# Patient Record
Sex: Female | Born: 1975 | Race: White | Hispanic: No | Marital: Married | Smoking: Never smoker
Health system: Southern US, Community
[De-identification: ages and names within clinical notes are randomized; demographics above are authoritative.]

## PROBLEM LIST (undated history)

## (undated) ENCOUNTER — Inpatient Hospital Stay (HOSPITAL_COMMUNITY): Payer: Self-pay

## (undated) DIAGNOSIS — Z8249 Family history of ischemic heart disease and other diseases of the circulatory system: Secondary | ICD-10-CM

## (undated) DIAGNOSIS — F411 Generalized anxiety disorder: Secondary | ICD-10-CM

## (undated) DIAGNOSIS — Z86010 Personal history of colonic polyps: Secondary | ICD-10-CM

## (undated) DIAGNOSIS — M1990 Unspecified osteoarthritis, unspecified site: Secondary | ICD-10-CM

## (undated) DIAGNOSIS — Z973 Presence of spectacles and contact lenses: Secondary | ICD-10-CM

## (undated) DIAGNOSIS — K635 Polyp of colon: Secondary | ICD-10-CM

## (undated) DIAGNOSIS — E559 Vitamin D deficiency, unspecified: Secondary | ICD-10-CM

## (undated) DIAGNOSIS — G40909 Epilepsy, unspecified, not intractable, without status epilepticus: Secondary | ICD-10-CM

## (undated) DIAGNOSIS — Z8719 Personal history of other diseases of the digestive system: Secondary | ICD-10-CM

## (undated) DIAGNOSIS — K589 Irritable bowel syndrome without diarrhea: Secondary | ICD-10-CM

## (undated) DIAGNOSIS — E282 Polycystic ovarian syndrome: Secondary | ICD-10-CM

## (undated) DIAGNOSIS — I2699 Other pulmonary embolism without acute cor pulmonale: Secondary | ICD-10-CM

## (undated) DIAGNOSIS — K5900 Constipation, unspecified: Secondary | ICD-10-CM

## (undated) DIAGNOSIS — Z8742 Personal history of other diseases of the female genital tract: Secondary | ICD-10-CM

## (undated) DIAGNOSIS — K829 Disease of gallbladder, unspecified: Secondary | ICD-10-CM

## (undated) DIAGNOSIS — Z87898 Personal history of other specified conditions: Secondary | ICD-10-CM

## (undated) DIAGNOSIS — Z860101 Personal history of adenomatous and serrated colon polyps: Secondary | ICD-10-CM

## (undated) DIAGNOSIS — Z9889 Other specified postprocedural states: Secondary | ICD-10-CM

## (undated) DIAGNOSIS — R112 Nausea with vomiting, unspecified: Secondary | ICD-10-CM

## (undated) DIAGNOSIS — K219 Gastro-esophageal reflux disease without esophagitis: Secondary | ICD-10-CM

## (undated) DIAGNOSIS — M51379 Other intervertebral disc degeneration, lumbosacral region without mention of lumbar back pain or lower extremity pain: Secondary | ICD-10-CM

## (undated) DIAGNOSIS — F909 Attention-deficit hyperactivity disorder, unspecified type: Secondary | ICD-10-CM

## (undated) DIAGNOSIS — M549 Dorsalgia, unspecified: Secondary | ICD-10-CM

## (undated) DIAGNOSIS — Z7901 Long term (current) use of anticoagulants: Secondary | ICD-10-CM

## (undated) DIAGNOSIS — R7303 Prediabetes: Secondary | ICD-10-CM

## (undated) DIAGNOSIS — N979 Female infertility, unspecified: Secondary | ICD-10-CM

## (undated) DIAGNOSIS — N920 Excessive and frequent menstruation with regular cycle: Secondary | ICD-10-CM

## (undated) DIAGNOSIS — IMO0002 Reserved for concepts with insufficient information to code with codable children: Secondary | ICD-10-CM

## (undated) DIAGNOSIS — Z91018 Allergy to other foods: Secondary | ICD-10-CM

## (undated) DIAGNOSIS — M2550 Pain in unspecified joint: Secondary | ICD-10-CM

## (undated) DIAGNOSIS — E669 Obesity, unspecified: Secondary | ICD-10-CM

## (undated) DIAGNOSIS — M5137 Other intervertebral disc degeneration, lumbosacral region: Secondary | ICD-10-CM

## (undated) DIAGNOSIS — F419 Anxiety disorder, unspecified: Secondary | ICD-10-CM

## (undated) DIAGNOSIS — J189 Pneumonia, unspecified organism: Secondary | ICD-10-CM

## (undated) HISTORY — DX: Prediabetes: R73.03

## (undated) HISTORY — DX: Constipation, unspecified: K59.00

## (undated) HISTORY — DX: Pain in unspecified joint: M25.50

## (undated) HISTORY — DX: Vitamin D deficiency, unspecified: E55.9

## (undated) HISTORY — PX: CHOLECYSTECTOMY: SHX55

## (undated) HISTORY — DX: Obesity, unspecified: E66.9

## (undated) HISTORY — PX: KNEE SURGERY: SHX244

## (undated) HISTORY — PX: COLPOSCOPY: SHX161

## (undated) HISTORY — DX: Female infertility, unspecified: N97.9

## (undated) HISTORY — DX: Other pulmonary embolism without acute cor pulmonale: I26.99

## (undated) HISTORY — DX: Dorsalgia, unspecified: M54.9

## (undated) HISTORY — DX: Allergy to other foods: Z91.018

## (undated) HISTORY — DX: Disease of gallbladder, unspecified: K82.9

## (undated) HISTORY — PX: KNEE ARTHROSCOPY: SUR90

## (undated) HISTORY — DX: Polyp of colon: K63.5

## (undated) HISTORY — PX: COLONOSCOPY: SHX174

## (undated) HISTORY — PX: UPPER GASTROINTESTINAL ENDOSCOPY: SHX188

## (undated) HISTORY — DX: Unspecified osteoarthritis, unspecified site: M19.90

## (undated) HISTORY — DX: Anxiety disorder, unspecified: F41.9

## (undated) HISTORY — PX: DILATION AND CURETTAGE OF UTERUS: SHX78

## (undated) HISTORY — DX: Epilepsy, unspecified, not intractable, without status epilepticus: G40.909

## (undated) HISTORY — PX: GYNECOLOGIC CRYOSURGERY: SHX857

---

## 1998-09-08 ENCOUNTER — Other Ambulatory Visit: Admission: RE | Admit: 1998-09-08 | Discharge: 1998-09-08 | Payer: Self-pay | Admitting: *Deleted

## 1999-01-09 ENCOUNTER — Other Ambulatory Visit: Admission: RE | Admit: 1999-01-09 | Discharge: 1999-01-09 | Payer: Self-pay | Admitting: Obstetrics and Gynecology

## 1999-01-20 ENCOUNTER — Other Ambulatory Visit: Admission: RE | Admit: 1999-01-20 | Discharge: 1999-01-20 | Payer: Self-pay | Admitting: Obstetrics and Gynecology

## 1999-05-09 ENCOUNTER — Other Ambulatory Visit: Admission: RE | Admit: 1999-05-09 | Discharge: 1999-05-09 | Payer: Self-pay | Admitting: Obstetrics and Gynecology

## 1999-08-22 ENCOUNTER — Other Ambulatory Visit: Admission: RE | Admit: 1999-08-22 | Discharge: 1999-08-22 | Payer: Self-pay | Admitting: *Deleted

## 2000-01-23 ENCOUNTER — Other Ambulatory Visit: Admission: RE | Admit: 2000-01-23 | Discharge: 2000-01-23 | Payer: Self-pay | Admitting: *Deleted

## 2000-02-23 ENCOUNTER — Encounter: Admission: RE | Admit: 2000-02-23 | Discharge: 2000-02-23 | Payer: Self-pay | Admitting: Internal Medicine

## 2000-02-23 ENCOUNTER — Encounter: Payer: Self-pay | Admitting: Internal Medicine

## 2000-12-09 ENCOUNTER — Encounter: Payer: Self-pay | Admitting: Internal Medicine

## 2000-12-09 ENCOUNTER — Encounter: Admission: RE | Admit: 2000-12-09 | Discharge: 2000-12-09 | Payer: Self-pay | Admitting: Internal Medicine

## 2001-09-02 ENCOUNTER — Encounter: Admission: RE | Admit: 2001-09-02 | Discharge: 2001-09-02 | Payer: Self-pay | Admitting: Internal Medicine

## 2001-09-02 ENCOUNTER — Encounter: Payer: Self-pay | Admitting: Internal Medicine

## 2001-10-08 HISTORY — PX: CHOLECYSTECTOMY, LAPAROSCOPIC: SHX56

## 2001-10-13 ENCOUNTER — Other Ambulatory Visit: Admission: RE | Admit: 2001-10-13 | Discharge: 2001-10-13 | Payer: Self-pay | Admitting: Obstetrics and Gynecology

## 2002-12-08 ENCOUNTER — Other Ambulatory Visit: Admission: RE | Admit: 2002-12-08 | Discharge: 2002-12-08 | Payer: Self-pay | Admitting: Obstetrics and Gynecology

## 2005-05-04 ENCOUNTER — Other Ambulatory Visit: Admission: RE | Admit: 2005-05-04 | Discharge: 2005-05-04 | Payer: Self-pay | Admitting: Obstetrics and Gynecology

## 2006-03-14 ENCOUNTER — Emergency Department (HOSPITAL_COMMUNITY): Admission: EM | Admit: 2006-03-14 | Discharge: 2006-03-14 | Payer: Self-pay | Admitting: Emergency Medicine

## 2008-04-27 ENCOUNTER — Encounter: Admission: RE | Admit: 2008-04-27 | Discharge: 2008-04-27 | Payer: Self-pay | Admitting: Family Medicine

## 2008-04-27 ENCOUNTER — Encounter: Admission: RE | Admit: 2008-04-27 | Discharge: 2008-06-29 | Payer: Self-pay | Admitting: Family Medicine

## 2009-10-08 HISTORY — PX: DIAGNOSTIC LAPAROSCOPY: SUR761

## 2010-05-17 LAB — LIPID PANEL
HDL: 48 mg/dL (ref 35–70)
Triglycerides: 100 mg/dL (ref 40–160)

## 2010-10-23 LAB — BASIC METABOLIC PANEL: Creatinine: 0.7 mg/dL (ref <=1.1)

## 2011-02-03 ENCOUNTER — Inpatient Hospital Stay (INDEPENDENT_AMBULATORY_CARE_PROVIDER_SITE_OTHER)
Admission: RE | Admit: 2011-02-03 | Discharge: 2011-02-03 | Disposition: A | Discharge status: Home / Self Care | Payer: Self-pay | Source: Ambulatory Visit | Attending: Family Medicine | Admitting: Family Medicine

## 2011-02-03 ENCOUNTER — Encounter: Payer: Self-pay | Admitting: Family Medicine

## 2011-02-03 DIAGNOSIS — J069 Acute upper respiratory infection, unspecified: Secondary | ICD-10-CM

## 2011-02-03 DIAGNOSIS — J029 Acute pharyngitis, unspecified: Secondary | ICD-10-CM

## 2011-02-03 LAB — CONVERTED CEMR LAB: Rapid Strep: NEGATIVE

## 2011-03-12 ENCOUNTER — Other Ambulatory Visit: Payer: Self-pay | Admitting: Obstetrics and Gynecology

## 2011-03-12 ENCOUNTER — Ambulatory Visit (HOSPITAL_COMMUNITY): Payer: BC Managed Care – PPO

## 2011-03-12 ENCOUNTER — Ambulatory Visit (HOSPITAL_COMMUNITY)
Admission: RE | Admit: 2011-03-12 | Discharge: 2011-03-12 | Disposition: A | Discharge status: Home / Self Care | Payer: BC Managed Care – PPO | Source: Ambulatory Visit | Attending: Obstetrics and Gynecology | Admitting: Obstetrics and Gynecology

## 2011-03-12 DIAGNOSIS — O021 Missed abortion: Secondary | ICD-10-CM | POA: Insufficient documentation

## 2011-03-12 HISTORY — PX: DILATION AND EVACUATION: SHX1459

## 2011-03-12 LAB — ABO/RH: ABO/RH(D): A POS

## 2011-03-12 LAB — CBC
MCH: 29.6 pg (ref 26.0–34.0)
Platelets: 311 10*3/uL (ref 150–400)
RBC: 4.5 MIL/uL (ref 3.87–5.11)
RDW: 13.6 % (ref 11.5–15.5)

## 2011-03-16 NOTE — Op Note (Signed)
  [name], [name]            ACCOUNT NO.:  [id number]  MEDICAL RECORD NO.:  [id number]  LOCATION:  [facility]                          FACILITY:  [facility]  PHYSICIAN:  Michaelangelo Mittelman L. Reis Pienta, M.D.DATE OF BIRTH:  [birth year]  DATE OF PROCEDURE:  03/12/2011 DATE OF DISCHARGE:                              OPERATIVE REPORT   PREOPERATIVE DIAGNOSIS:  Missed abortion.  POSTOPERATIVE DIAGNOSIS:  Missed abortion.  PROCEDURE:  D and E with chromosomes with ultrasound guidance.  SURGEON:  Jasmen Emrich L. Vincente Poli, MD  ANESTHESIA:  MAC with paracervical block.  FINDINGS:  Products of conception.  SPECIMENS:  Products of conception sent to Pathology.  ESTIMATED BLOOD LOSS:  Minimal.  COMPLICATIONS:  None.  PROCEDURE:  The patient was taken to the operating room . After informed consent was obtained, she was then prepped and draped in the usual sterile fashion.  In-and-out catheter was used to empty the bladder. Paracervical block was performed in standard fashion.  Cervical internal os was gently dilated using Pratt dilators.  Using ultrasound guidance, the #7 suction cannula was inserted.  Suction curettage was performed x2 with retrieval contents grossly consistent with products of conception. A sharp curette was inserted and the uterus was thoroughly curetted of all tissue until the uterine cavity appeared clean.  A final suction curettage was then performed.  All instruments were removed from the vagina.  All sponge, lap, and instrument counts were correct x2.  A portion of the tissue was sent for karyotype and the remainder will be sent for pathology routine.  ESTIMATED BLOOD LOSS:  Minimal.  COMPLICATIONS:  None.     Taylyn Brame L. Vincente Poli, M.D.     Florestine Avers  D:  03/12/2011  T:  03/13/2011  Job:  [id number]  Electronically Signed by Marcelle Overlie M.D. on 03/16/2011 07:12:42 AM

## 2011-07-02 ENCOUNTER — Inpatient Hospital Stay (INDEPENDENT_AMBULATORY_CARE_PROVIDER_SITE_OTHER)
Admission: RE | Admit: 2011-07-02 | Discharge: 2011-07-02 | Disposition: A | Discharge status: Home / Self Care | Payer: BC Managed Care – PPO | Source: Ambulatory Visit | Attending: Family Medicine | Admitting: Family Medicine

## 2011-07-02 ENCOUNTER — Encounter: Payer: Self-pay | Admitting: Family Medicine

## 2011-07-02 DIAGNOSIS — S8990XA Unspecified injury of unspecified lower leg, initial encounter: Secondary | ICD-10-CM

## 2011-07-08 ENCOUNTER — Encounter (HOSPITAL_COMMUNITY): Payer: Self-pay | Admitting: *Deleted

## 2011-07-08 ENCOUNTER — Inpatient Hospital Stay (HOSPITAL_COMMUNITY): Payer: BC Managed Care – PPO

## 2011-07-08 ENCOUNTER — Inpatient Hospital Stay (HOSPITAL_COMMUNITY)
Admission: AD | Admit: 2011-07-08 | Discharge: 2011-07-09 | Disposition: A | Discharge status: Home Health | Payer: BC Managed Care – PPO | Source: Ambulatory Visit | Attending: Obstetrics and Gynecology | Admitting: Obstetrics and Gynecology

## 2011-07-08 DIAGNOSIS — O209 Hemorrhage in early pregnancy, unspecified: Secondary | ICD-10-CM | POA: Insufficient documentation

## 2011-07-08 HISTORY — DX: Polycystic ovarian syndrome: E28.2

## 2011-07-08 HISTORY — DX: Irritable bowel syndrome without diarrhea: K58.9

## 2011-07-08 HISTORY — DX: Reserved for concepts with insufficient information to code with codable children: IMO0002

## 2011-07-08 LAB — URINALYSIS, ROUTINE W REFLEX MICROSCOPIC
Bilirubin Urine: NEGATIVE
Ketones, ur: NEGATIVE mg/dL
Nitrite: NEGATIVE
Protein, ur: NEGATIVE mg/dL
Specific Gravity, Urine: <1.005 — ABNORMAL LOW (ref 1.005–1.030)
Urobilinogen, UA: 0.2 mg/dL (ref 0.0–1.0)

## 2011-07-08 NOTE — ED Provider Notes (Signed)
History     Chief Complaint  Patient presents with  . Vaginal Bleeding   HPI  Pt is [redacted]w[redacted]d pregnant and states that she has had some pink mucous spotting this evening.  She has a history of miscarriage at 8 weeks and is also IUI.  She has had 2 ultrasounds with this pregnancy with confirmation of viability.  She denies cramping, UTI symptoms, chills, fever or constipation.    Past Medical History  Diagnosis Date  . PCOS (polycystic ovarian syndrome)   . IBS (irritable bowel syndrome)   . Abnormal Pap smear and cervical HPV (human papillomavirus)     Past Surgical History  Procedure Date  . Colposcopy   . Gynecologic cryosurgery   . Cholecystectomy     No family history on file.  History  Substance Use Topics  . Smoking status: Former Games developer  . Smokeless tobacco: Never Used  . Alcohol Use: No    Allergies:  Allergies  Allergen Reactions  . Codeine Itching  . Morphine And Related Other (See Comments)    Big mood changes  . Robitussin A-C (Guiatuss Ac) Itching  . Shrimp (Shellfish Allergy) Hives and Swelling    Throat closes    Prescriptions prior to admission  Medication Sig Dispense Refill  . cetirizine (ZYRTEC) 10 MG tablet Take 10 mg by mouth daily.        Marland Kitchen escitalopram (LEXAPRO) 10 MG tablet Take 10 mg by mouth daily.        . metFORMIN (GLUCOPHAGE) 500 MG tablet Take 500 mg by mouth daily with breakfast.        . Prenat w/o A-FeCbn-DSS-FA-DHA (CITRANATAL 90 DHA PO) Take 1 tablet by mouth daily.        . progesterone 200 MG SUPP Place 200 mg vaginally at bedtime.          ROS Physical Exam   Blood pressure 143/99, pulse 77, temperature 98.9 F (37.2 C), temperature source Oral, resp. rate 20, height 5' 5.5" (1.664 m), weight 277 lb (125.646 kg).  Physical Exam Pt  Is well developed well nourished white obese married female alert and oriented in no acute distress Pelvic speculum exam showed small amount of pink tinged mucousy discharge in vault; cervix  clean and closed- bimanual deffered MAU Course  Procedures speculum exam Ultrasound showed viable IUP Discussed with Dr. Arelia Sneddon Pt and husband reassured- will follow up with next OB appointment Continue progesterone supp    Assessment and Plan  Bleeding in early pregnancy Viable IUP  LINEBERRY,SUSAN 07/08/2011, 10:53 PM

## 2011-07-08 NOTE — Progress Notes (Signed)
Pt saw small bleeding on the toilet paper after she wiped-no active bleeding

## 2011-07-08 NOTE — Progress Notes (Signed)
Pt had SAB 03/09/11.  Is on progesterone vag supp during this preg.  Most recent u/s in office 3 days ago with viable fetus noted.  Had bleeding in toilet with small clot, then when wiping.

## 2011-07-10 LAB — OB RESULTS CONSOLE HIV ANTIBODY (ROUTINE TESTING): HIV: NONREACTIVE

## 2011-07-27 ENCOUNTER — Encounter: Payer: Self-pay | Admitting: Emergency Medicine

## 2011-07-27 ENCOUNTER — Inpatient Hospital Stay (INDEPENDENT_AMBULATORY_CARE_PROVIDER_SITE_OTHER)
Admission: RE | Admit: 2011-07-27 | Discharge: 2011-07-27 | Disposition: A | Discharge status: Home / Self Care | Payer: BC Managed Care – PPO | Source: Ambulatory Visit | Attending: Emergency Medicine | Admitting: Emergency Medicine

## 2011-07-27 DIAGNOSIS — J0100 Acute maxillary sinusitis, unspecified: Secondary | ICD-10-CM

## 2011-07-27 LAB — CONVERTED CEMR LAB: Rapid Strep: NEGATIVE

## 2011-09-10 NOTE — Progress Notes (Signed)
Summary: TOE INJ...WSE Room 4   Vital Signs:  Patient Profile:   35 Years Old Female CC:      Stumped rt great toe Height:     65 inches Weight:      275 pounds O2 Sat:      100 % O2 treatment:    Room Air Temp:     98.7 degrees F oral Pulse rate:   73 / minute Pulse rhythm:   regular Resp:     20 per minute BP sitting:   120 / 66  (left arm) Cuff size:   large  Vitals Entered By: Emilio Math (July 02, 2011 8:11 PM)                  Current Allergies (reviewed today): ! CODEINE ! * ENVIRONMENTALHistory of Present Illness Chief Complaint: Stumped rt great toe History of Present Illness:  Subjective:  Patient injured her right great toe when she bumped it.  Current Meds ZYRTEC HIVES RELIEF 10 MG TABS (CETIRIZINE HCL)  LEXAPRO 10 MG TABS (ESCITALOPRAM OXALATE)  METFORMIN HCL 500 MG TABS (METFORMIN HCL)  CITRANATAL 90 DHA 90-1 & 300 MG MISC (PRENAT W/O A-FECBGL-DSS-FA-DHA)   REVIEW OF SYSTEMS Constitutional Symptoms      Denies fever, chills, night sweats, weight loss, weight gain, and fatigue.  Eyes       Denies change in vision, eye pain, eye discharge, glasses, contact lenses, and eye surgery. Ear/Nose/Throat/Mouth       Denies hearing loss/aids, change in hearing, ear pain, ear discharge, dizziness, frequent runny nose, frequent nose bleeds, sinus problems, sore throat, hoarseness, and tooth pain or bleeding.  Respiratory       Denies dry cough, productive cough, wheezing, shortness of breath, asthma, bronchitis, and emphysema/COPD.  Cardiovascular       Denies murmurs, chest pain, and tires easily with exhertion.    Gastrointestinal       Denies stomach pain, nausea/vomiting, diarrhea, constipation, blood in bowel movements, and indigestion. Genitourniary       Denies painful urination, kidney stones, and loss of urinary control. Neurological       Denies paralysis, seizures, and fainting/blackouts. Musculoskeletal       Denies muscle pain, joint  pain, joint stiffness, decreased range of motion, redness, swelling, muscle weakness, and gout.  Skin       Complains of hair/skni or nail changes.      Denies bruising and unusual mles/lumps or sores.  Psych       Denies mood changes, temper/anger issues, anxiety/stress, speech problems, depression, and sleep problems.  Past History:  Past Medical History: Reviewed history from 02/03/2011 and no changes required. PCOS  Past Surgical History: LT knee surgery 2000 Cholecystectomy 2004 exploratory laproscopic abd surgery 2011 Denies surgical history  Family History: Reviewed history from 02/03/2011 and no changes required. 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  Full range of motion all joints.  Distal 5mm of left half of nail is partly avulsed.  No evidence laceration beneath nail. Assessment New Problems: TOE INJURY (ICD-959.7)  MINIMAL TOENAIL AVULSION  Plan New Orders: Est. Patient Level III [78295] Planning Comments:   Reassurance. Bandage applied.  Recommend applying daily bandage until nail grows out.  Cut nail short.   The patient and/or caregiver has been counseled thoroughly with regard to medications prescribed including dosage, schedule, interactions, rationale for use, and possible side  effects and they verbalize understanding.  Diagnoses and expected course of recovery discussed and will return if not improved as expected or if the condition worsens. Patient and/or caregiver verbalized understanding.   Orders Added: 1)  Est. Patient Level III [95621]

## 2011-09-10 NOTE — Progress Notes (Signed)
Summary: ?Strep/TM rm 5   Vital Signs:  Patient Profile:   35 Years Old Female CC:      sore throat, HA, fever x 4-5 days Height:     65 inches Weight:      261.50 pounds O2 Sat:      98 % O2 treatment:    Room Air Temp:     98.6 degrees F oral Pulse rate:   93 / minute Resp:     16 per minute BP sitting:   125 / 91  (left arm) Cuff size:   large  Vitals Entered By: Clemens Catholic LPN (February 03, 2011 10:13 AM)                  Updated Prior Medication List: ZYRTEC HIVES RELIEF 10 MG TABS (CETIRIZINE HCL)  LEXAPRO 10 MG TABS (ESCITALOPRAM OXALATE)  METFORMIN HCL 500 MG TABS (METFORMIN HCL)  CITRANATAL 90 DHA 90-1 & 300 MG MISC (PRENAT W/O A-FECBGL-DSS-FA-DHA)   Current Allergies: ! CODEINE ! * ENVIRONMENTALHistory of Present Illness Chief Complaint: sore throat, HA, fever x 4-5 days History of Present Illness:  Subjective: Patient complains of URI symptoms for about 5 days with initial frontal headache and pressure in her ears and sinuses. + sore throat + cough for 3 days No pleuritic pain No wheezing + nasal congestion + post nasal drainage No itchy/red eyes ? earache No hemoptysis No SOB No fever, + chills and sweats No nausea No vomiting No abdominal pain No diarrhea No skin rashes + fatigue No myalgias + headache Used OTC meds without relief   REVIEW OF SYSTEMS Constitutional Symptoms       Complains of fever, chills, and night sweats.     Denies weight loss, weight gain, and fatigue.  Eyes       Complains of eye drainage and contact lenses.      Denies change in vision, eye pain, glasses, and eye surgery.      Comments: watery eyes Ear/Nose/Throat/Mouth       Complains of ear pain, ear discharge, frequent runny nose, sinus problems, and sore throat.      Denies hearing loss/aids, change in hearing, dizziness, frequent nose bleeds, hoarseness, and tooth pain or bleeding.  Respiratory       Denies dry cough, productive cough, wheezing, shortness  of breath, asthma, bronchitis, and emphysema/COPD.  Cardiovascular       Denies murmurs, chest pain, and tires easily with exhertion.    Gastrointestinal       Denies stomach pain, nausea/vomiting, diarrhea, constipation, blood in bowel movements, and indigestion. Genitourniary       Denies painful urination, kidney stones, and loss of urinary control. Neurological       Complains of headaches.      Denies paralysis, seizures, and fainting/blackouts. Musculoskeletal       Denies muscle pain, joint pain, joint stiffness, decreased range of motion, redness, swelling, muscle weakness, and gout.  Skin       Denies bruising, unusual mles/lumps or sores, and hair/skin or nail changes.  Psych       Denies mood changes, temper/anger issues, anxiety/stress, speech problems, depression, and sleep problems. Other Comments: pt c/o sore throat, bilateral ear ache, HA x 4-5 days, she developed a fever last night. she is trying to get pregnant so she has not taken any OTC meds.    Past History:  Past Medical History: PCOS  Past Surgical History: LT knee surgery 2000 Cholecystectomy 2004 exploratory laproscopic  abd surgery 2011  Family History: none  Social History: Never Smoked Alcohol use-no Drug use-no Smoking Status:  never Drug Use:  no   Objective:  Appearance:  Patient appears healthy, stated age, and in no acute distress  Eyes:  Pupils are equal, round, and reactive to light and accomdation.  Extraocular movement is intact.  Conjunctivae are not inflamed.  Ears:  Canals normal.  Tympanic membranes normal.   Nose:  Mildly congested turbinates.  No sinus tenderness  Pharynx:  Normal Neck:  Supple.  Slightly tender shotty posterior nodes are palpated bilaterally.  Lungs:  Clear to auscultation.  Breath sounds are equal.  Heart:  Regular rate and rhythm without murmurs, rubs, or gallops.  Abdomen:  Nontender without masses or hepatosplenomegaly.  Bowel sounds are present.  No CVA  or flank tenderness.  Extremities:  No edema. Skin:  No rash  Rapid strep test negative  Tympanogram:  Normal both ears. Assessment New Problems: UPPER RESPIRATORY INFECTION, ACUTE (ICD-465.9) ACUTE PHARYNGITIS (ICD-462)  NO EVIDENCE BACTERIAL INFECTION TODAY  Plan New Medications/Changes: AMOXICILLIN 875 MG TABS (AMOXICILLIN) One by mouth two times a day. (Rx void after 02/11/11)  #14 x 0, 02/03/2011, Donna Christen MD  New Orders: Services provided After hours-Weekends-Holidays [99051] Pulse Oximetry (single measurment) [94760] Tympanometry [92567] Rapid Strep [69629] New Patient Level IV [52841] Planning Comments:   Treat symptomatically for now:  Increase fluid intake, begin expectorant/decongestant, topical decongestant,  cough suppressant at bedtime.  If fever/chills/sweats persist, or if not improving 5 to 7 days begin amoxicillin (given Rx to hold).  Followup with PCP if not improving 7 to 10 days.   The patient and/or caregiver has been counseled thoroughly with regard to medications prescribed including dosage, schedule, interactions, rationale for use, and possible side effects and they verbalize understanding.  Diagnoses and expected course of recovery discussed and will return if not improved as expected or if the condition worsens. Patient and/or caregiver verbalized understanding.  Prescriptions: AMOXICILLIN 875 MG TABS (AMOXICILLIN) One by mouth two times a day. (Rx void after 02/11/11)  #14 x 0   Entered and Authorized by:   Donna Christen MD   Signed by:   Donna Christen MD on 02/03/2011   Method used:   Print then Give to Patient   RxID:   [id number]   Patient Instructions: 1)  Stop Zyrtec for now  2)  Increase fluid intake, rest. 3)   Also recommend using saline nasal spray several times daily and/or saline nasal irrigation. 4)  Begin amoxicillin if not improving about 5 to 7 days or if persistent fever develops. 5)  Followup with family doctor if not  improving 7 to 10 days.   Orders Added: 1)  Services provided After hours-Weekends-Holidays [99051] 2)  Pulse Oximetry (single measurment) [94760] 3)  Tympanometry [92567] 4)  Rapid Strep [03474] 5)  New Patient Level IV [99204]    Laboratory Results  Date/Time Received: February 03, 2011 10:18 AM  Date/Time Reported: February 03, 2011 10:18 AM   Other Tests  Rapid Strep: negative  Kit Test Internal QC: Negative   (Normal Range: Negative)

## 2011-09-10 NOTE — Progress Notes (Signed)
Summary: Possible Sinus Infection rm 5   Vital Signs:  Patient Profile:   35 Years Old Female CC:      Ha, cough, sore throat, bil ear pressure x 9-10 days Height:     65 inches Weight:      273.75 pounds O2 Sat:      99 % O2 treatment:    Room Air Temp:     98.8 degrees F oral Pulse rate:   81 / minute Resp:     18 per minute BP sitting:   100 / 66  (left arm) Cuff size:   large  Vitals Entered By: Clemens Catholic LPN (July 27, 2011 10:09 AM)                  Updated Prior Medication List: ZYRTEC HIVES RELIEF 10 MG TABS (CETIRIZINE HCL)  LEXAPRO 10 MG TABS (ESCITALOPRAM OXALATE)  METFORMIN HCL 500 MG TABS (METFORMIN HCL)  CITRANATAL 90 DHA 90-1 & 300 MG MISC (PRENAT W/O A-FECBGL-DSS-FA-DHA)  PROGESTERONE 50 MG/ML OIL (PROGESTERONE)   Current Allergies (reviewed today): ! CODEINE ! * ENVIRONMENTALHistory of Present Illness Chief Complaint: Ha, cough, sore throat, bil ear pressure x 9-10 days History of Present Illness: 35 Years Old Female complains of onset of cold symptoms for 5-7 days.  Mareta has been using 2 Tylenol last night which is helping a little bit.  She is [redacted] weeks pregnant and has PCOS so it took a long time to become pregnant.  She called her OB and is concerned. + sore throat No cough No pleuritic pain No wheezing +nasal congestion +post-nasal drainage +sinus pain/pressure No chest congestion No itchy/red eyes + earache No hemoptysis No SOB No chills/sweats ? fever No nausea No vomiting No abdominal pain No diarrhea No skin rashes No fatigue No myalgias No headache   REVIEW OF SYSTEMS Constitutional Symptoms       Complains of fever, chills, and night sweats.     Denies weight loss, weight gain, and fatigue.  Eyes       Complains of contact lenses.      Denies change in vision, eye pain, eye discharge, glasses, and eye surgery. Ear/Nose/Throat/Mouth       Complains of ear pain, frequent runny nose, sinus problems, and sore  throat.      Denies hearing loss/aids, change in hearing, ear discharge, dizziness, frequent nose bleeds, hoarseness, and tooth pain or bleeding.  Respiratory       Complains of dry cough.      Denies productive cough, wheezing, shortness of breath, asthma, bronchitis, and emphysema/COPD.  Cardiovascular       Denies murmurs, chest pain, and tires easily with exhertion.    Gastrointestinal       Denies stomach pain, nausea/vomiting, diarrhea, constipation, blood in bowel movements, and indigestion. Genitourniary       Denies painful urination, kidney stones, and loss of urinary control. Neurological       Complains of headaches.      Denies paralysis, seizures, and fainting/blackouts. Musculoskeletal       Denies muscle pain, joint pain, joint stiffness, decreased range of motion, redness, swelling, muscle weakness, and gout.  Skin       Denies bruising, unusual mles/lumps or sores, and hair/skin or nail changes.  Psych       Denies mood changes, temper/anger issues, anxiety/stress, speech problems, depression, and sleep problems. Other Comments: pt c/o Ha, cough, sore throat, bil ear pressure x 9-10 days. she has taken  tylenol.   Past History:  Past Medical History: PCOS IBS  Past Surgical History: Reviewed history from 07/02/2011 and no changes required. LT knee surgery 2000 Cholecystectomy 2004 exploratory laproscopic abd surgery 2011 Denies surgical history  Family History: mom- IBS  Social History: Reviewed history from 07/02/2011 and no changes required. 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 Patient Level II [04540] Rapid Strep [98119] Planning Comments:   No meds given today.  Education given instead.  Discussed class A, B, C, D, X with patient.  Since she is AMA with PCOS, she really needs to weigh the pros and cons of taking any meds during pregnancy, especially during the first trimester.  Tylenol is likely ok, but I'd prefer nothing.  She may have L maxillary sinusitis, but I think it can be treated with nasal saline, warm compresses, rest, hydration.  She should call her OB to discuss on Monday no matter what.  If getting much worse with high fevers, may consider Amox at that time (Class B in pregnancy).  The patient understands and agrees.   The patient and/or caregiver has been counseled thoroughly with regard to medications prescribed including dosage, schedule, interactions, rationale for use, and possible side effects and they verbalize understanding.  Diagnoses and expected course of recovery discussed and will return if not improved as expected or if the condition worsens. Patient and/or caregiver verbalized understanding.   Orders Added: 1)  Est. Patient Level II [14782] 2)  Rapid Strep [95621]    Laboratory Results  Date/Time Received: July 27, 2011 10:11 AM  Date/Time Reported: July 27, 2011 10:11 AM   Other Tests  Rapid Strep: negative  Kit Test Internal QC: Negative   (Normal Range: Negative)

## 2011-10-09 NOTE — L&D Delivery Note (Signed)
Operative Delivery Note At 3:40 PM a viable female was delivered via Vaginal, Spontaneous Delivery.  Presentation: vertex; Position: Right,, Occiput,, Anterior; Station: +3.  Verbal consent: obtained from patient.  Risks and benefits discussed in detail.  Risks include, but are not limited to the risks of anesthesia, bleeding, infection, damage to maternal tissues, fetal cephalhematoma.  There is also the risk of inability to effect vaginal delivery of the head, or shoulder dystocia that cannot be resolved by established maneuvers, leading to the need for emergency cesarean section.  APGAR: 9 9   Placenta status: delivered spontaneously with 3 v cord  Cord:  with the following complications: short.   Anesthesia:epidural   Instruments: mushroom vacuum Episiotomy: none Lacerations: 2nd degree Suture Repair: 2.0 3.0 chromic vicryl Est. Blood Loss (mL): 400  Mom to postpartum.  Baby to nursery-stable.  Barbara Mcmahon L 02/11/2012, 4:03 PM

## 2011-12-04 ENCOUNTER — Inpatient Hospital Stay (HOSPITAL_COMMUNITY)
Admission: AD | Admit: 2011-12-04 | Discharge: 2011-12-04 | Disposition: A | Discharge status: Home / Self Care | Payer: BC Managed Care – PPO | Source: Ambulatory Visit | Attending: Obstetrics and Gynecology | Admitting: Obstetrics and Gynecology

## 2011-12-04 ENCOUNTER — Encounter (HOSPITAL_COMMUNITY): Payer: Self-pay | Admitting: *Deleted

## 2011-12-04 ENCOUNTER — Inpatient Hospital Stay (HOSPITAL_COMMUNITY): Payer: BC Managed Care – PPO

## 2011-12-04 DIAGNOSIS — IMO0002 Reserved for concepts with insufficient information to code with codable children: Secondary | ICD-10-CM | POA: Insufficient documentation

## 2011-12-04 DIAGNOSIS — O479 False labor, unspecified: Secondary | ICD-10-CM

## 2011-12-04 DIAGNOSIS — S3991XA Unspecified injury of abdomen, initial encounter: Secondary | ICD-10-CM

## 2011-12-04 DIAGNOSIS — R1084 Generalized abdominal pain: Secondary | ICD-10-CM

## 2011-12-04 DIAGNOSIS — O368190 Decreased fetal movements, unspecified trimester, not applicable or unspecified: Secondary | ICD-10-CM | POA: Insufficient documentation

## 2011-12-04 LAB — CBC
HCT: 33 % — ABNORMAL LOW (ref 36.0–46.0)
Hemoglobin: 11.1 g/dL — ABNORMAL LOW (ref 12.0–15.0)
RBC: 3.65 MIL/uL — ABNORMAL LOW (ref 3.87–5.11)
WBC: 12.5 10*3/uL — ABNORMAL HIGH (ref 4.0–10.5)
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
## 2011-12-25 ENCOUNTER — Encounter: Payer: Self-pay | Admitting: *Deleted

## 2011-12-25 ENCOUNTER — Emergency Department
Admission: EM | Admit: 2011-12-25 | Discharge: 2011-12-25 | Disposition: A | Discharge status: Home / Self Care | Payer: BC Managed Care – PPO | Source: Home / Self Care

## 2011-12-25 DIAGNOSIS — J029 Acute pharyngitis, unspecified: Secondary | ICD-10-CM

## 2011-12-25 DIAGNOSIS — J069 Acute upper respiratory infection, unspecified: Secondary | ICD-10-CM

## 2011-12-25 LAB — POCT RAPID STREP A (OFFICE): Rapid Strep A Screen: NEGATIVE

## 2011-12-25 NOTE — ED Provider Notes (Signed)
History     CSN: [id number]  Arrival date & time 12/25/11  0935   None     Chief Complaint  Patient presents with  . Sore Throat    Patient is a 36 y.o. female presenting with pharyngitis. The history is provided by the patient.  Sore Throat This is a new problem. The current episode started 2 days ago. The problem occurs constantly. The problem has not changed since onset.Associated symptoms include headaches. Associated symptoms comments: Right earache, intermittent cough, postnasal drip. The symptoms are aggravated by nothing. The symptoms are relieved by nothing. She has tried nothing for the symptoms.    Past Medical History  Diagnosis Date  . PCOS (polycystic ovarian syndrome)   . IBS (irritable bowel syndrome)   . Abnormal Pap smear and cervical HPV (human papillomavirus)   . Personality disorder     Past Surgical History  Procedure Date  . Colposcopy   . Gynecologic cryosurgery   . Cholecystectomy     Family History  Problem Relation Age of Onset  . Hypertension Father   . Diabetes Paternal Grandmother   . Hypertension Paternal Grandmother   . Stroke Paternal Grandmother   . Diabetes Paternal Grandfather   . Hypertension Paternal Grandfather   . Stroke Paternal Grandfather   . Hypertension Mother     History  Substance Use Topics  . Smoking status: Never Smoker   . Smokeless tobacco: Never Used  . Alcohol Use: No    OB History    Grav Para Term Preterm Abortions TAB SAB Ect Mult Living   2    1  1          Review of Systems  Constitutional: Positive for fever and chills.  HENT: Positive for ear pain, congestion and postnasal drip.   Respiratory: Positive for cough.   Neurological: Positive for headaches.  All other systems reviewed and are negative.    Allergies  Codeine; Morphine and related; Robitussin a-c; and Shrimp  Home Medications   Current Outpatient Rx  Name Route Sig Dispense Refill  . ACETAMINOPHEN 500 MG PO TABS Oral Take  1,000 mg by mouth every 6 (six) hours as needed. Pain, headache    . CETIRIZINE HCL 10 MG PO TABS Oral Take 10 mg by mouth daily.      Marland Kitchen ESCITALOPRAM OXALATE 10 MG PO TABS Oral Take 10 mg by mouth daily.      Marland Kitchen METFORMIN HCL 500 MG PO TABS Oral Take 500 mg by mouth daily with breakfast.      . PROTONIX PO Oral Take 1 tablet by mouth at bedtime.    Marland Kitchen CITRANATAL 90 DHA PO Oral Take 1 tablet by mouth daily.        BP 136/84  Pulse 80  Temp(Src) 98.4 F (36.9 C) (Oral)  Resp 18  Ht 5' 5.5" (1.664 m)  Wt 293 lb 8 oz (133.131 kg)  BMI 48.10 kg/m2  SpO2 98%  Physical Exam  Nursing note and vitals reviewed. Constitutional: She appears well-developed.  HENT:  Head: Normocephalic and atraumatic.  Right Ear: External ear normal.  Left Ear: External ear normal.  Nose: Nose normal.  Mouth/Throat: Oropharynx is clear and moist.  Eyes: Pupils are equal, round, and reactive to light.  Neck: Normal range of motion. Neck supple.  Cardiovascular: Normal rate, regular rhythm and normal heart sounds.   Pulmonary/Chest: Effort normal and breath sounds normal. No respiratory distress.  Lymphadenopathy:    She has no cervical adenopathy.  ED Course  Procedures none   Labs Reviewed  POCT RAPID STREP A (OFFICE)   No results found. Rapid strep test negative   1. Acute pharyngitis       MDM          Lannie Fields, NP 12/25/11 1323

## 2011-12-25 NOTE — Discharge Instructions (Signed)
Your strep test is negative.  Increase fluid intake.  Take tylenol and Zyrtec as needed for symptom management.  Practice good hand hygiene.Sore Throat Sore throats may be caused by bacteria and viruses. They may also be caused by:  Smoking.   Pollution.   Allergies.  If a sore throat is due to strep infection (a bacterial infection), you may need:  A throat swab.   A culture test to verify the strep infection.  You will need one of these:  An antibiotic shot.   Oral medicine for a full 10 days.  Strep infection is very contagious. A doctor should check any close contacts who have a sore throat or fever. A sore throat caused by a virus infection will usually last only 3-4 days. Antibiotics will not treat a viral sore throat.  Infectious mononucleosis (a viral disease), however, can cause a sore throat that lasts for up to 3 weeks. Mononucleosis can be diagnosed with blood tests. You must have been sick for at least 1 week in order for the test to give accurate results. HOME CARE INSTRUCTIONS   To treat a sore throat, take mild pain medicine.   Increase your fluids.   Eat a soft diet.   Do not smoke.   Gargling with warm water or salt water (1 tsp. salt in 8 oz. water) can be helpful.   Try throat sprays or lozenges or sucking on hard candy to ease the symptoms.  Call your doctor if your sore throat lasts longer than 1 week.  SEEK IMMEDIATE MEDICAL CARE IF:  You have difficulty breathing.   You have increased swelling in the throat.   You have pain so severe that you are unable to swallow fluids or your saliva.   You have a severe headache, a high fever, vomiting, or a red rash.  Document Released: 11/01/2004 Document Revised: 09/13/2011 Document Reviewed: 09/11/2007 [facility] Patient Information 2012 [place], [place].

## 2011-12-25 NOTE — ED Provider Notes (Signed)
History     CSN: [id number]  Arrival date & time 12/25/11  0935   None     Chief Complaint  Patient presents with  . Sore Throat    (Consider location/radiation/quality/duration/timing/severity/associated sxs/prior treatment) HPI  Past Medical History  Diagnosis Date  . PCOS (polycystic ovarian syndrome)   . IBS (irritable bowel syndrome)   . Abnormal Pap smear and cervical HPV (human papillomavirus)   . Personality disorder     Past Surgical History  Procedure Date  . Colposcopy   . Gynecologic cryosurgery   . Cholecystectomy     Family History  Problem Relation Age of Onset  . Hypertension Father   . Diabetes Paternal Grandmother   . Hypertension Paternal Grandmother   . Stroke Paternal Grandmother   . Diabetes Paternal Grandfather   . Hypertension Paternal Grandfather   . Stroke Paternal Grandfather   . Hypertension Mother     History  Substance Use Topics  . Smoking status: Never Smoker   . Smokeless tobacco: Never Used  . Alcohol Use: No    OB History    Grav Para Term Preterm Abortions TAB SAB Ect Mult Living   2    1  1          Review of Systems  Allergies  Codeine; Morphine and related; Robitussin a-c; and Shrimp  Home Medications   Current Outpatient Rx  Name Route Sig Dispense Refill  . ACETAMINOPHEN 500 MG PO TABS Oral Take 1,000 mg by mouth every 6 (six) hours as needed. Pain, headache    . CETIRIZINE HCL 10 MG PO TABS Oral Take 10 mg by mouth daily.      Marland Kitchen ESCITALOPRAM OXALATE 10 MG PO TABS Oral Take 10 mg by mouth daily.      Marland Kitchen METFORMIN HCL 500 MG PO TABS Oral Take 500 mg by mouth daily with breakfast.      . PROTONIX PO Oral Take 1 tablet by mouth at bedtime.    Marland Kitchen CITRANATAL 90 DHA PO Oral Take 1 tablet by mouth daily.        BP 136/84  Pulse 80  Temp(Src) 98.4 F (36.9 C) (Oral)  Resp 18  Ht 5' 5.5" (1.664 m)  Wt 293 lb 8 oz (133.131 kg)  BMI 48.10 kg/m2  SpO2 98%  Physical Exam  ED Course  Procedures (including  critical care time)   Labs Reviewed  POCT RAPID STREP A (OFFICE)   No results found.   1. Acute pharyngitis       MDM  Treat symptomatically        Lattie Haw, MD 12/25/11 Ernestina [place]

## 2011-12-25 NOTE — ED Notes (Signed)
Pt c/o sore throat, RT ear ache, and HA x 2 days. No OTC meds.

## 2012-01-12 ENCOUNTER — Encounter (HOSPITAL_COMMUNITY): Payer: Self-pay | Admitting: *Deleted

## 2012-01-12 ENCOUNTER — Inpatient Hospital Stay (HOSPITAL_COMMUNITY)
Admission: AD | Admit: 2012-01-12 | Discharge: 2012-01-12 | Disposition: A | Discharge status: Home / Self Care | Payer: BC Managed Care – PPO | Source: Ambulatory Visit | Attending: Obstetrics and Gynecology | Admitting: Obstetrics and Gynecology

## 2012-01-12 DIAGNOSIS — R112 Nausea with vomiting, unspecified: Secondary | ICD-10-CM

## 2012-01-12 DIAGNOSIS — R197 Diarrhea, unspecified: Secondary | ICD-10-CM | POA: Insufficient documentation

## 2012-01-12 DIAGNOSIS — O212 Late vomiting of pregnancy: Secondary | ICD-10-CM | POA: Insufficient documentation

## 2012-01-12 MED ORDER — PROMETHAZINE HCL 12.5 MG PO TABS: 12.5000 mg | ORAL_TABLET | Freq: Four times a day (QID) | ORAL | Status: DC | PRN

## 2012-01-12 MED ORDER — LACTATED RINGERS IV SOLN
25.0000 mg | Freq: Once | INTRAVENOUS | Status: AC
  Administered 2012-01-12: 25 mg via INTRAVENOUS
  Filled 2012-01-12: qty 1

## 2012-01-12 NOTE — MAU Provider Note (Signed)
History     CSN: [id number]  Arrival date and time: 01/12/12 1333   First Provider Initiated Contact with Patient 01/12/12 1414      Chief Complaint  Patient presents with  . Diarrhea   HPI Barbara Mcmahon is 36 y.o. G2P0010 [redacted]w[redacted]d weeks presenting with >20 episodes of diarrhea since 5am.  Has had nausea and just now vomited X 3.  Patient of Dr. Lynnell Dike.  States she ate a salad at a restaurant /baby shower yesterday.   Only one that got sick only one that had salad.   Last ate this am- a few pretzels.  Denies fever.  Having chills.      Past Medical History  Diagnosis Date  . PCOS (polycystic ovarian syndrome)   . IBS (irritable bowel syndrome)   . Abnormal Pap smear and cervical HPV (human papillomavirus)   . Personality disorder     Past Surgical History  Procedure Date  . Colposcopy   . Gynecologic cryosurgery   . Cholecystectomy     Family History  Problem Relation Age of Onset  . Hypertension Father   . Diabetes Paternal Grandmother   . Hypertension Paternal Grandmother   . Stroke Paternal Grandmother   . Diabetes Paternal Grandfather   . Hypertension Paternal Grandfather   . Stroke Paternal Grandfather   . Hypertension Mother     History  Substance Use Topics  . Smoking status: Never Smoker   . Smokeless tobacco: Never Used  . Alcohol Use: No    Allergies:  Allergies  Allergen Reactions  . Codeine Hives and Itching  . Morphine And Related Other (See Comments)    Big mood changes  . Robitussin A-C (Guiatuss Ac) Itching  . Shrimp (Shellfish Allergy) Hives and Swelling    Throat closes    Prescriptions prior to admission  Medication Sig Dispense Refill  . acetaminophen (TYLENOL) 500 MG tablet Take 1,000 mg by mouth every 6 (six) hours as needed. Pain, headache      . cetirizine (ZYRTEC) 10 MG tablet Take 10 mg by mouth daily.        Marland Kitchen escitalopram (LEXAPRO) 10 MG tablet Take 10 mg by mouth daily.        . metFORMIN (GLUCOPHAGE) 500 MG tablet Take  500 mg by mouth daily with breakfast.        . Pantoprazole Sodium (PROTONIX PO) Take 1 tablet by mouth at bedtime.      . Prenat w/o A-FeCbn-DSS-FA-DHA (CITRANATAL 90 DHA PO) Take 1 tablet by mouth daily.          Review of Systems  Constitutional: Positive for chills. Negative for fever.  Gastrointestinal: Positive for nausea, vomiting and diarrhea.   Physical Exam   Blood pressure 124/71, pulse 109, temperature 98.1 F (36.7 C), temperature source Oral, resp. rate 18, height 5' 5.5" (1.664 m), weight 133.358 kg (294 lb).  Physical Exam  Constitutional: She is oriented to person, place, and time. She appears well-developed and well-nourished. No distress.       Feels tired.    Neck: Normal range of motion.  Respiratory: Effort normal.  Neurological: She is alert and oriented to person, place, and time.  Skin: Skin is warm and dry.  Psychiatric: She has a normal mood and affect. Her behavior is normal.    MAU Course  Procedures  MDM 14:35  Reported patient's history and  MSE to Dr. Renaldo Fiddler.  Order given to bolus 1 liter LR with Phenergan 25mg .   May  follow with second liter if needed.  May give Immodium of patient desires.   Discussed option of Phenergan and Zofran.  Patient states she is very tired and would like to sleep.  Will give Phenergan.  Patient declined Immodium at this time.  Wants to wait to see if hydration helps.     16:45  Went in to check on patient.  1 liter of LR with Phenergan 25mg  has now infused and she states she is feeling better.  She does not want Immodium, has at home if she changes her mind.  She is ready for discharge.  Assessment and Plan  A:  Nausea, vomiting, and diarrhea           Differential DX:  Gastroenteritis                               Food poisoning                                Noro Virus  P:  Instructed to begin BRAT diet after vomiting stops     Important to stay well hydrated     Rx for Phenergan     Keep appointment with Dr.  Vincente Poli for continued prenatal care or earlier if sxs worsen  P:   Bianco Cange,EVE M 01/12/2012, 2:28 PM

## 2012-01-12 NOTE — MAU Note (Signed)
Pt reports having diarrhea off and on all morning along with abd cramping. Pt denies N/V. MD told her to come in .

## 2012-01-12 NOTE — Discharge Instructions (Signed)
B.R.A.T. Diet Your doctor has recommended the B.R.A.T. diet for you or your child until the condition improves. This is often used to help control diarrhea and vomiting symptoms. If you or your child can tolerate clear liquids, you may have:  Bananas.   Rice.   Applesauce.   Toast (and other simple starches such as crackers, potatoes, noodles).  Be sure to avoid dairy products, meats, and fatty foods until symptoms are better. Fruit juices such as apple, grape, and prune juice can make diarrhea worse. Avoid these. Continue this diet for 2 days or as instructed by your caregiver. Document Released: 09/24/2005 Document Revised: 09/13/2011 Document Reviewed: 03/13/2007 [facility] Patient Information 2012 [place], [place].Diarrhea Diarrhea is watery poop (stool). The most common cause of diarrhea is a germ. Other causes include:  Food poisoning.   A reaction to medicine.  HOME CARE   Drink clear fluids. This can stop you from losing too much body fluid (dehydration).   Drink enough fluids to keep your pee (urine) clear or pale yellow.   Avoid solid foods and dairy products until you start to feel better. Then start eating bland foods, such as:   Bananas.   Rice.   Crackers.   Applesauce.   Dry toast.   Avoid spicy foods, caffeine, and alcohol.   Your doctor may give medicine to help with cramps and watery poop. Take this as told. Avoid these medicines if you have a fever or blood in your poop.   Take your medicine as told. Finish them even if you start to feel better.  GET HELP RIGHT AWAY IF:   The watery poop lasts longer than 3 days.   You have a fever.   Your baby is older than 3 months with a rectal temperature of 100.5 F (38.1 C) or higher for more than 1 day.   There is blood in your poop.   You start to throw up (vomit).   You lose too much fluid.  MAKE SURE YOU:   Understand these instructions.   Will watch your condition.   Will get help right away if  you are not doing well or get worse.  Document Released: 03/12/2008 Document Revised: 09/13/2011 Document Reviewed: 03/12/2008 [facility] Patient Information 2012 [place], [place].Nausea and Vomiting Nausea is a sick feeling that often comes before throwing up (vomiting). Vomiting is a reflex where stomach contents come out of your mouth. Vomiting can cause severe loss of body fluids (dehydration). Children and elderly adults can become dehydrated quickly, especially if they also have diarrhea. Nausea and vomiting are symptoms of a condition or disease. It is important to find the cause of your symptoms. CAUSES   Direct irritation of the stomach lining. This irritation can result from increased acid production (gastroesophageal reflux disease), infection, food poisoning, taking certain medicines (such as nonsteroidal anti-inflammatory drugs), alcohol use, or tobacco use.   Signals from the brain.These signals could be caused by a headache, heat exposure, an inner ear disturbance, increased pressure in the brain from injury, infection, a tumor, or a concussion, pain, emotional stimulus, or metabolic problems.   An obstruction in the gastrointestinal tract (bowel obstruction).   Illnesses such as diabetes, hepatitis, gallbladder problems, appendicitis, kidney problems, cancer, sepsis, atypical symptoms of a heart attack, or eating disorders.   Medical treatments such as chemotherapy and radiation.   Receiving medicine that makes you sleep (general anesthetic) during surgery.  DIAGNOSIS Your caregiver may ask for tests to be done if the problems do not  improve after a few days. Tests may also be done if symptoms are severe or if the reason for the nausea and vomiting is not clear. Tests may include:  Urine tests.   Blood tests.   Stool tests.   Cultures (to look for evidence of infection).   X-rays or other imaging studies.  Test results can help your caregiver make decisions about treatment  or the need for additional tests. TREATMENT You need to stay well hydrated. Drink frequently but in small amounts.You may wish to drink water, sports drinks, clear broth, or eat frozen ice pops or gelatin dessert to help stay hydrated.When you eat, eating slowly may help prevent nausea.There are also some antinausea medicines that may help prevent nausea. HOME CARE INSTRUCTIONS   Take all medicine as directed by your caregiver.   If you do not have an appetite, do not force yourself to eat. However, you must continue to drink fluids.   If you have an appetite, eat a normal diet unless your caregiver tells you differently.   Eat a variety of complex carbohydrates (rice, wheat, potatoes, bread), lean meats, yogurt, fruits, and vegetables.   Avoid high-fat foods because they are more difficult to digest.   Drink enough water and fluids to keep your urine clear or pale yellow.   If you are dehydrated, ask your caregiver for specific rehydration instructions. Signs of dehydration may include:   Severe thirst.   Dry lips and mouth.   Dizziness.   Dark urine.   Decreasing urine frequency and amount.   Confusion.   Rapid breathing or pulse.  SEEK IMMEDIATE MEDICAL CARE IF:   You have blood or brown flecks (like coffee grounds) in your vomit.   You have black or bloody stools.   You have a severe headache or stiff neck.   You are confused.   You have severe abdominal pain.   You have chest pain or trouble breathing.   You do not urinate at least once every 8 hours.   You develop cold or clammy skin.   You continue to vomit for longer than 24 to 48 hours.   You have a fever.  MAKE SURE YOU:   Understand these instructions.   Will watch your condition.   Will get help right away if you are not doing well or get worse.  Document Released: 09/24/2005 Document Revised: 09/13/2011 Document Reviewed: 02/21/2011 [facility] Patient Information 2012 [place], [place].

## 2012-01-17 LAB — OB RESULTS CONSOLE GBS: GBS: NEGATIVE

## 2012-02-11 ENCOUNTER — Inpatient Hospital Stay (HOSPITAL_COMMUNITY): Payer: BC Managed Care – PPO | Admitting: Anesthesiology

## 2012-02-11 ENCOUNTER — Encounter (HOSPITAL_COMMUNITY): Payer: Self-pay | Admitting: *Deleted

## 2012-02-11 ENCOUNTER — Encounter (HOSPITAL_COMMUNITY): Payer: Self-pay | Admitting: Anesthesiology

## 2012-02-11 ENCOUNTER — Inpatient Hospital Stay (HOSPITAL_COMMUNITY)
Admission: AD | Admit: 2012-02-11 | Discharge: 2012-02-13 | DRG: 373 | Disposition: A | Discharge status: Home / Self Care | Payer: BC Managed Care – PPO | Source: Ambulatory Visit | Attending: Obstetrics and Gynecology | Admitting: Obstetrics and Gynecology

## 2012-02-11 DIAGNOSIS — J0100 Acute maxillary sinusitis, unspecified: Secondary | ICD-10-CM

## 2012-02-11 DIAGNOSIS — O09529 Supervision of elderly multigravida, unspecified trimester: Secondary | ICD-10-CM | POA: Diagnosis present

## 2012-02-11 HISTORY — DX: Generalized anxiety disorder: F41.1

## 2012-02-11 LAB — CBC
Hemoglobin: 12.4 g/dL (ref 12.0–15.0)
MCH: 29 pg (ref 26.0–34.0)
MCHC: 32.9 g/dL (ref 30.0–36.0)
MCV: 88.3 fL (ref 78.0–100.0)
Platelets: 284 10*3/uL (ref 150–400)
RBC: 4.27 MIL/uL (ref 3.87–5.11)

## 2012-02-11 MED ORDER — LACTATED RINGERS IV SOLN
INTRAVENOUS | Status: DC
  Administered 2012-02-11 (×2): via INTRAVENOUS

## 2012-02-11 MED ORDER — LANOLIN HYDROUS EX OINT: TOPICAL_OINTMENT | CUTANEOUS | Status: DC | PRN

## 2012-02-11 MED ORDER — OXYCODONE-ACETAMINOPHEN 5-325 MG PO TABS
1.0000 | ORAL_TABLET | ORAL | Status: DC | PRN
  Administered 2012-02-12: 2 via ORAL
  Administered 2012-02-12: 1 via ORAL
  Filled 2012-02-11: qty 2
  Filled 2012-02-11: qty 1

## 2012-02-11 MED ORDER — FENTANYL 2.5 MCG/ML BUPIVACAINE 1/10 % EPIDURAL INFUSION (WH - ANES)
14.0000 mL/h | INTRAMUSCULAR | Status: DC
  Filled 2012-02-11: qty 60

## 2012-02-11 MED ORDER — SODIUM BICARBONATE 8.4 % IV SOLN
INTRAVENOUS | Status: DC | PRN
  Administered 2012-02-11: 4 mL via EPIDURAL

## 2012-02-11 MED ORDER — PANTOPRAZOLE SODIUM 20 MG PO TBEC
20.0000 mg | DELAYED_RELEASE_TABLET | Freq: Every day | ORAL | Status: DC
  Administered 2012-02-11 – 2012-02-12 (×2): 20 mg via ORAL
  Filled 2012-02-11 (×3): qty 1

## 2012-02-11 MED ORDER — CITRIC ACID-SODIUM CITRATE 334-500 MG/5ML PO SOLN: 30.0000 mL | ORAL | Status: DC | PRN

## 2012-02-11 MED ORDER — ONDANSETRON HCL 4 MG/2ML IJ SOLN: 4.0000 mg | INTRAMUSCULAR | Status: DC | PRN

## 2012-02-11 MED ORDER — FLEET ENEMA 7-19 GM/118ML RE ENEM: 1.0000 | ENEMA | Freq: Every day | RECTAL | Status: DC | PRN

## 2012-02-11 MED ORDER — DIPHENHYDRAMINE HCL 50 MG/ML IJ SOLN: 12.5000 mg | INTRAMUSCULAR | Status: DC | PRN

## 2012-02-11 MED ORDER — IBUPROFEN 600 MG PO TABS: 600.0000 mg | ORAL_TABLET | Freq: Four times a day (QID) | ORAL | Status: DC | PRN

## 2012-02-11 MED ORDER — PHENYLEPHRINE 40 MCG/ML (10ML) SYRINGE FOR IV PUSH (FOR BLOOD PRESSURE SUPPORT): 80.0000 ug | PREFILLED_SYRINGE | INTRAVENOUS | Status: DC | PRN

## 2012-02-11 MED ORDER — LIDOCAINE HCL (PF) 1 % IJ SOLN: 30.0000 mL | INTRAMUSCULAR | Status: DC | PRN

## 2012-02-11 MED ORDER — TERBUTALINE SULFATE 1 MG/ML IJ SOLN: 0.2500 mg | Freq: Once | INTRAMUSCULAR | Status: DC | PRN

## 2012-02-11 MED ORDER — PHENYLEPHRINE 40 MCG/ML (10ML) SYRINGE FOR IV PUSH (FOR BLOOD PRESSURE SUPPORT)
80.0000 ug | PREFILLED_SYRINGE | INTRAVENOUS | Status: DC | PRN
  Administered 2012-02-11: 80 ug via INTRAVENOUS
  Filled 2012-02-11: qty 5

## 2012-02-11 MED ORDER — DIPHENHYDRAMINE HCL 25 MG PO CAPS: 25.0000 mg | ORAL_CAPSULE | Freq: Four times a day (QID) | ORAL | Status: DC | PRN

## 2012-02-11 MED ORDER — TETANUS-DIPHTH-ACELL PERTUSSIS 5-2.5-18.5 LF-MCG/0.5 IM SUSP: 0.5000 mL | Freq: Once | INTRAMUSCULAR | Status: DC

## 2012-02-11 MED ORDER — EPHEDRINE 5 MG/ML INJ
10.0000 mg | INTRAVENOUS | Status: AC | PRN
  Administered 2012-02-11 (×2): 10 mg via INTRAVENOUS
  Filled 2012-02-11: qty 4

## 2012-02-11 MED ORDER — BENZOCAINE-MENTHOL 20-0.5 % EX AERO
1.0000 "application " | INHALATION_SPRAY | CUTANEOUS | Status: DC | PRN
  Administered 2012-02-11 – 2012-02-13 (×2): 1 via TOPICAL
  Filled 2012-02-11 (×2): qty 56

## 2012-02-11 MED ORDER — SIMETHICONE 80 MG PO CHEW: 80.0000 mg | CHEWABLE_TABLET | ORAL | Status: DC | PRN

## 2012-02-11 MED ORDER — OXYTOCIN 20 UNITS IN LACTATED RINGERS INFUSION - SIMPLE: 125.0000 mL/h | Freq: Once | INTRAVENOUS | Status: DC

## 2012-02-11 MED ORDER — IBUPROFEN 600 MG PO TABS
600.0000 mg | ORAL_TABLET | Freq: Four times a day (QID) | ORAL | Status: DC
  Administered 2012-02-11 – 2012-02-13 (×8): 600 mg via ORAL
  Filled 2012-02-11 (×8): qty 1

## 2012-02-11 MED ORDER — EPHEDRINE 5 MG/ML INJ: 10.0000 mg | INTRAVENOUS | Status: DC | PRN

## 2012-02-11 MED ORDER — LACTATED RINGERS IV SOLN
500.0000 mL | INTRAVENOUS | Status: DC | PRN
  Administered 2012-02-11: 500 mL via INTRAVENOUS

## 2012-02-11 MED ORDER — LACTATED RINGERS IV SOLN
500.0000 mL | Freq: Once | INTRAVENOUS | Status: AC
  Administered 2012-02-11: 500 mL via INTRAVENOUS

## 2012-02-11 MED ORDER — ONDANSETRON HCL 4 MG/2ML IJ SOLN: 4.0000 mg | Freq: Four times a day (QID) | INTRAMUSCULAR | Status: DC | PRN

## 2012-02-11 MED ORDER — LORATADINE 10 MG PO TABS
10.0000 mg | ORAL_TABLET | Freq: Every day | ORAL | Status: DC
  Administered 2012-02-11 – 2012-02-13 (×3): 10 mg via ORAL
  Filled 2012-02-11 (×4): qty 1

## 2012-02-11 MED ORDER — MEASLES, MUMPS & RUBELLA VAC ~~LOC~~ INJ: 0.5000 mL | INJECTION | Freq: Once | SUBCUTANEOUS | Status: DC

## 2012-02-11 MED ORDER — OXYCODONE-ACETAMINOPHEN 5-325 MG PO TABS: 1.0000 | ORAL_TABLET | ORAL | Status: DC | PRN

## 2012-02-11 MED ORDER — WITCH HAZEL-GLYCERIN EX PADS: 1.0000 "application " | MEDICATED_PAD | CUTANEOUS | Status: DC | PRN

## 2012-02-11 MED ORDER — ACETAMINOPHEN 325 MG PO TABS: 650.0000 mg | ORAL_TABLET | ORAL | Status: DC | PRN

## 2012-02-11 MED ORDER — MEDROXYPROGESTERONE ACETATE 150 MG/ML IM SUSP: 150.0000 mg | INTRAMUSCULAR | Status: DC | PRN

## 2012-02-11 MED ORDER — FENTANYL 2.5 MCG/ML BUPIVACAINE 1/10 % EPIDURAL INFUSION (WH - ANES)
INTRAMUSCULAR | Status: DC | PRN
  Administered 2012-02-11: 14 mL/h via EPIDURAL

## 2012-02-11 MED ORDER — FLEET ENEMA 7-19 GM/118ML RE ENEM: 1.0000 | ENEMA | RECTAL | Status: DC | PRN

## 2012-02-11 MED ORDER — PRENATAL MULTIVITAMIN CH
1.0000 | ORAL_TABLET | Freq: Every day | ORAL | Status: DC
  Administered 2012-02-11 – 2012-02-13 (×3): 1 via ORAL
  Filled 2012-02-11 (×3): qty 1

## 2012-02-11 MED ORDER — DIBUCAINE 1 % RE OINT: 1.0000 "application " | TOPICAL_OINTMENT | RECTAL | Status: DC | PRN

## 2012-02-11 MED ORDER — ZOLPIDEM TARTRATE 5 MG PO TABS: 5.0000 mg | ORAL_TABLET | Freq: Every evening | ORAL | Status: DC | PRN

## 2012-02-11 MED ORDER — METFORMIN HCL 500 MG PO TABS
500.0000 mg | ORAL_TABLET | Freq: Every day | ORAL | Status: DC
  Administered 2012-02-12: 500 mg via ORAL
  Filled 2012-02-11 (×3): qty 1

## 2012-02-11 MED ORDER — BISACODYL 10 MG RE SUPP: 10.0000 mg | Freq: Every day | RECTAL | Status: DC | PRN

## 2012-02-11 MED ORDER — OXYTOCIN BOLUS FROM INFUSION
500.0000 mL | Freq: Once | INTRAVENOUS | Status: DC
  Filled 2012-02-11: qty 500

## 2012-02-11 MED ORDER — SENNOSIDES-DOCUSATE SODIUM 8.6-50 MG PO TABS
2.0000 | ORAL_TABLET | Freq: Every day | ORAL | Status: DC
  Administered 2012-02-11 – 2012-02-12 (×2): 2 via ORAL

## 2012-02-11 MED ORDER — ESCITALOPRAM OXALATE 10 MG PO TABS
10.0000 mg | ORAL_TABLET | Freq: Every day | ORAL | Status: DC
  Administered 2012-02-11 – 2012-02-13 (×3): 10 mg via ORAL
  Filled 2012-02-11 (×4): qty 1

## 2012-02-11 MED ORDER — ONDANSETRON HCL 4 MG PO TABS: 4.0000 mg | ORAL_TABLET | ORAL | Status: DC | PRN

## 2012-02-11 MED ORDER — OXYTOCIN 20 UNITS IN LACTATED RINGERS INFUSION - SIMPLE
1.0000 m[IU]/min | INTRAVENOUS | Status: DC
  Administered 2012-02-11: 2 m[IU]/min via INTRAVENOUS
  Filled 2012-02-11: qty 1000

## 2012-02-11 NOTE — H&P (Signed)
36 year old G 2 P 0010 at 39 weeks presents in active labor. PNC see [facility] GBBS is negative  Afebrile  Vital signs stable Fetal heart rate is reactive Toco irregular UCs Lung CTA Car RRR Abd is soft and non tender Cervix in office 90%/4 - 5 / -2 Vertex  IMPRESSION: IUP at 39 weeks Labor  Plan: Epidual anticipate NSVD

## 2012-02-11 NOTE — Anesthesia Preprocedure Evaluation (Signed)

## 2012-02-11 NOTE — Anesthesia Procedure Notes (Signed)

## 2012-02-12 LAB — CBC
MCV: 89.1 fL (ref 78.0–100.0)
Platelets: 240 10*3/uL (ref 150–400)
RBC: 3.4 MIL/uL — ABNORMAL LOW (ref 3.87–5.11)
WBC: 18.4 10*3/uL — ABNORMAL HIGH (ref 4.0–10.5)

## 2012-02-12 NOTE — Anesthesia Postprocedure Evaluation (Signed)
  Anesthesia Post-op Note  Patient: Barbara Mcmahon  Procedure(s) Performed: * No surgery found *  Patient Location: Mother/Baby  Anesthesia Type: Epidural  Level of Consciousness: awake  Airway and Oxygen Therapy: Patient Spontanous Breathing  Post-op Pain: none  Post-op Assessment: Patient's Cardiovascular Status Stable, Respiratory Function Stable, Patent Airway, No signs of Nausea or vomiting, Adequate PO intake, Pain level controlled, No headache, No backache, No residual numbness and No residual motor weakness  Post-op Vital Signs: Reviewed and stable  Complications: No apparent anesthesia complications

## 2012-02-12 NOTE — Progress Notes (Signed)
Post Partum Day 1 Subjective: no complaints, up ad lib, voiding, tolerating PO and + flatus  Objective: Blood pressure 121/79, pulse 85, temperature 97.9 F (36.6 C), temperature source Oral, resp. rate 18, height 5' 4.5" (1.638 m), weight 131.997 kg (291 lb), SpO2 98.00%, unknown if currently breastfeeding.  Physical Exam:  General: alert and cooperative Lochia: appropriate Uterine Fundus: firm Incision: perineum intact DVT Evaluation: No evidence of DVT seen on physical exam.   Basename 02/12/12 0505 02/11/12 1120  HGB 10.0* 12.4  HCT 30.3* 37.7    Assessment/Plan: Plan for discharge tomorrow   LOS: 1 day   CURTIS,CAROL G 02/12/2012, 7:49 AM

## 2012-02-12 NOTE — Progress Notes (Signed)

## 2012-02-13 MED ORDER — IBUPROFEN 600 MG PO TABS: 600.0000 mg | ORAL_TABLET | Freq: Four times a day (QID) | ORAL | Status: AC

## 2012-02-13 MED ORDER — OXYCODONE-ACETAMINOPHEN 5-325 MG PO TABS: 1.0000 | ORAL_TABLET | ORAL | Status: AC | PRN

## 2012-02-13 NOTE — Progress Notes (Signed)
Post Partum Day 2 Subjective: no complaints, up ad lib, voiding, tolerating PO and + flatus  Objective: Blood pressure 97/62, pulse 89, temperature 98.3 F (36.8 C), temperature source Oral, resp. rate 20, height 5' 4.5" (1.638 m), weight 131.997 kg (291 lb), SpO2 97.00%, unknown if currently breastfeeding.  Physical Exam:  General: alert and cooperative Lochia: appropriate Uterine Fundus: firm Incision: perineum intact DVT Evaluation: No evidence of DVT seen on physical exam.   Basename 02/12/12 0505 02/11/12 1120  HGB 10.0* 12.4  HCT 30.3* 37.7    Assessment/Plan: Discharge home   LOS: 2 days   Ras Kollman G 02/13/2012, 7:59 AM

## 2012-02-13 NOTE — Discharge Summary (Signed)
Obstetric Discharge Summary Reason for Admission: onset of labor Prenatal Procedures: none and ultrasound Intrapartum Procedures: vacuum Postpartum Procedures: none Complications-Operative and Postpartum: 2 degree perineal laceration Hemoglobin  Date Value Range Status  02/12/2012 10.0* 12.0-15.0 (Mcmahon/dL) Final     DELTA CHECK NOTED     REPEATED TO VERIFY     HCT  Date Value Range Status  02/12/2012 30.3* 36.0-46.0 (%) Final    Physical Exam:  General: alert and cooperative Lochia: appropriate Uterine Fundus: firm Incision: perineum intact DVT Evaluation: No evidence of DVT seen on physical exam.  Discharge Diagnoses: Term Pregnancy-delivered  Discharge Information: Date: 02/13/2012 Activity: pelvic rest Diet: routine Medications: PNV, Ibuprofen, Percocet and lexapro Condition: stable Instructions: refer to practice specific booklet Discharge to: home   Newborn Data: Live born female  Birth Weight: 6 lb 9.8 oz (2999 Mcmahon) APGAR: 8, 9  Home with mother.  Barbara Mcmahon 02/13/2012, 8:15 AM

## 2012-02-27 ENCOUNTER — Ambulatory Visit (HOSPITAL_COMMUNITY)
Admission: RE | Admit: 2012-02-27 | Discharge: 2012-02-27 | Disposition: A | Discharge status: Home / Self Care | Payer: BC Managed Care – PPO | Source: Ambulatory Visit | Attending: Obstetrics and Gynecology | Admitting: Obstetrics and Gynecology

## 2012-02-27 NOTE — Progress Notes (Signed)
Infant Lactation Consultation Outpatient Visit Note  Patient Name: Barbara Mcmahon Date of Birth: 1976/08/03 Birth Weight:   Gestational Age at Delivery: Gestational Age: <None> Type of Delivery:   Breastfeeding History Frequency of Breastfeeding: 10 times a day Length of Feeding:  Voids: 6+ Stools: 3-4  Supplementing / Method: Pumping:  Type of Pump:PIS   Frequency:NA  Volume:    Comments:  Concerned pumping might be painful.  Here today because baby was not back to birthweight at 72 days old.  Pediatrician recommended supplementing after feeding.  Has been taking approximately 30 ml by bottle.  Barbara Mcmahon's weight is greater than her birth weight today.  Consultation Evaluation:  Initial Feeding Assessment: Pre-feed [id number]:0454 Post-feed Weight:3030 Amount Transferred:36 ml Comments:  Chewing at the breast.  Dimpling noted.  Cleft in tip of tongue.  :  Additional Feeding Assessment: :  Total Breast milk Transferred this Visit: 56 (20 via bottle) Total Supplement Given: 20 ml breast milk and 20 ml formula  Additional Interventions:   Attempted  Finger feeding, SNS and Nipple sheild not successful.  Mother was able to pump 20 ml after breast feeding.  Paced bottle feeding taught.  Barbara Mcmahon did have a deeper suck with this method. Plan is to have mother pump and continue supplementing.     Follow-Up Friday at ped.  Tuesday in lactation.      Barbara Mcmahon 02/27/2012, 10:37 AM

## 2012-02-28 ENCOUNTER — Encounter (HOSPITAL_COMMUNITY): Payer: BC Managed Care – PPO

## 2012-03-04 ENCOUNTER — Ambulatory Visit (HOSPITAL_COMMUNITY)
Admission: RE | Admit: 2012-03-04 | Discharge: 2012-03-04 | Disposition: A | Discharge status: Home / Self Care | Payer: BC Managed Care – PPO | Source: Ambulatory Visit | Attending: Obstetrics and Gynecology | Admitting: Obstetrics and Gynecology

## 2012-03-04 NOTE — Progress Notes (Signed)
Adult Lactation Consultation Outpatient Visit Note  Patient Name: Barbara Mcmahon)    BABY: [name] Date of Birth: 1976/09/21                         DOB: [birth year] Gestational Age at Delivery: TERM        BIRTH WEIGHT: 6-9.8 Type of Delivery: NVD                             WEIGHT TODAY: 7-6.8  Breastfeeding History: Frequency of Breastfeeding: EVERY 2-3 HOURS Length of Feeding: 20+ Voids: 10 Stools: 2-3 YELLOW OR BROWN  Supplementing / Method:FORMULA/BOTTLE 2-4 OZ PC EBM/FORMULA Pumping:  Type of Pump:PIS   Frequency:3 TIMES/24 HOURS  Volume: 30 MLS   Comments:    Consultation Evaluation:Mom and 60 week old baby here for follow up appointment from 02/27/12.  Baby has history of greater than 10 % weight loss at 12 days when mom started formula supplementation.  Baby has gained 15 oz/ 6 days.  Mom has history of PCOS and anovulatory cycles.  She understands this may be a link with low milk supply.  Mom started pumping some 1 week ago but usually only getting 3 pumpings in per day.  Observed baby latch and nurse on both breasts.  Latch shallow initially and mom shown techniques to obtain deeper latch.  Baby alternates between nutritive, non nutritive sucking and some chewing.  Swallows heard and mom shown how good breast massage/compression increases baby's nutritive sucking.  Mom instructed to continue feeding at least every 3 hours and importance of increasing frequency of post pumping to increase milk supply.  Information given on fenugreek and moringa.  Mom will continue to supplement with EBM/FORMULA after breastfeeding.  Initial Feeding Assessment:LEFT BREAST 15 MINUTES Pre-feed Weight:3370 Post-feed Weight:3390 Amount Transferred:20 MLS Comments:  Additional Feeding Assessment:RIGHT BREAST X 15 MINUTES Pre-feed Weight:3390 Post-feed Weight:3410 Amount Transferred:20 MLS Comments:  Additional Feeding Assessment: Pre-feed Weight: Post-feed Weight: Amount  Transferred: Comments:  Total Breast milk Transferred this Visit: 40 MLS Total Supplement Given: NONE  BABY RECEIVED 60 MLS PRIOR TO APPOINTMENT  Additional Interventions:   Follow-UpOUTPATIENT APPOINTMENT June 4 2:30 PM      Hansel Feinstein 03/04/2012, 2:27 PM

## 2012-03-05 ENCOUNTER — Ambulatory Visit (HOSPITAL_COMMUNITY): Payer: BC Managed Care – PPO

## 2012-03-11 ENCOUNTER — Ambulatory Visit (HOSPITAL_COMMUNITY)
Admission: RE | Admit: 2012-03-11 | Discharge: 2012-03-11 | Disposition: A | Discharge status: Home / Self Care | Payer: BC Managed Care – PPO | Source: Ambulatory Visit | Attending: Obstetrics and Gynecology | Admitting: Obstetrics and Gynecology

## 2012-03-13 NOTE — Progress Notes (Signed)
Adult Lactation Consultation Outpatient Visit Note  Patient Name: Barbara Mcmahon (mom)    BABY: Barbara Mcmahon Date of Birth: May 03, 1976                            DOB: [birth year] Gestational Age at Delivery: 2 WEEKS  BIRTH WEIGHT: 6-9.8 Type of Delivery: NVD                               WEIGHT 03/04/12  7-6.8                                                                    WEIGHT TODAY  8-4.1 Breastfeeding History: Frequency of Breastfeeding: 2-2.5 HOURS Length of Feeding: 15 MINUTES EACH BREASTVoids: qs  Stools: qs  Supplementing / Method:EBM/FORMULA 60 MLS PC Pumping:  Type of Pump:PUMP IN STYLE   Frequency:4-5 TIMES PER DAY AFTER FEEDINGS  Volume:  1.5 OZ-2 OZ TOTAL  Comments:    Consultation Evaluation: Mom and baby here for follow up from 1 week ago.  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  Mom states she has relaxed more and OK with the possibility of needing to continue some formula supplementation.  Mom started fenugreek and reports breasts feeling fuller. Mom latching baby easily and well.  Baby nurses actively with audible swallows.  Milk transfer 76 mls after 30 minute feeding.  Mom pleased.  Demonstrated SNS to use when mom desires for supplementation and baby did well and mom comfortable with this.  Mom will continue plan and call prn for follow up.  Initial Feeding Assessment: LEFT BREAST 20 MINUTES Pre-feed ZOXWRU:0454 Post-feed UJWJXB:1478 Amount Transferred:62 MLS Comments:  Additional Feeding Assessment:RIGHT BREAST 10 MINUTES Pre-feed Weight:3806 Post-feed Weight:3820 Amount Transferred:14 MLS Comments:  Additional Feeding Assessment: Pre-feed Weight: Post-feed Weight: Amount Transferred: Comments:  Total Breast milk Transferred this Visit: 76 MLS Total Supplement Given: NONE  Additional Interventions:   Follow-Up WILL CALL LC OFFICE PRN      Hansel Feinstein 03/13/2012, 7:50 AM

## 2012-03-19 ENCOUNTER — Other Ambulatory Visit: Payer: Self-pay | Admitting: Obstetrics and Gynecology

## 2012-04-28 ENCOUNTER — Ambulatory Visit (HOSPITAL_COMMUNITY)
Admission: RE | Admit: 2012-04-28 | Discharge: 2012-04-28 | Disposition: A | Discharge status: Home / Self Care | Payer: BC Managed Care – PPO | Source: Ambulatory Visit | Attending: Obstetrics and Gynecology | Admitting: Obstetrics and Gynecology

## 2012-04-28 NOTE — Progress Notes (Signed)
Adult Lactation Consultation Outpatient Visit Note  Patient Name: Barbara Mcmahon (mother)     BABY: Barbara Mcmahon Pares Date of Birth: [birth year]                                DOB: [birth year] Gestational Age at Delivery: 39 weeks          BIRTH WEIGHT: 6-9.8 Type of Delivery:                                             WEIGHT TODAY: 12-6.2  Breastfeeding History: Frequency of Breastfeeding: EVERY 3 HOURS BOTH BREASTS Length of Feeding: 30-45 MINUTES Voids: QS Stools: QS  Supplementing / Method:FORMULA/BOTTLE 15 MLS-60 MLS PC EVERY 3 HOURS Pumping:  Type of Pump:PUMP IN STYLE   Frequency:ONCE IN AM, ONCE IN PM  Volume:  2-4 OZ  Mom has been freezing this milk for RTW  Comments:    Consultation Evaluation:Mom and 26 week old baby here today for feeding assessment.  Mom has history of delayed and possibly low milk supply.  Mom states baby started clamping down during breastfeeding 2 weeks ago and nipples are becoming sore.  She states the clamping occurs throughout the feeding on both breasts.  Mom denies any changes in bottles or pacifiers.  Baby shows no signs of gum swelling or oral thrush.  Observed baby at breast with gulping frequently and baby needing to pull back at times to take a break.  Mom reports that she noticed milk leaking more and spraying recently.  Explained to mom that due to increased milk supply her letdown is fast and at times forceful so baby clamps to control flow.  This was observed on both breasts.  Recommended mom try leaning back during feeding and elevating baby's head to handle flow.  Comfort gels given for pink, tender nipples.  Mom returns to work in one week and plans to pump while at work.  Baby transferred 102 mls in 15 minutes.  Recommended mom discontinue formula supplementation.  Initial Feeding Assessment: Pre-feed Weight: Post-feed Weight: Amount Transferred: Comments:  Additional Feeding Assessment: Pre-feed Weight: Post-feed Weight: Amount  Transferred: Comments:  Additional Feeding Assessment: Pre-feed Weight: Post-feed Weight: Amount Transferred: Comments:  Total Breast milk Transferred this Visit: 102 mls Total Supplement Given: none  Additional Interventions:   Follow-Up Will call prn      Hansel Feinstein 04/28/2012, 2:36 PM

## 2012-06-13 ENCOUNTER — Emergency Department
Admission: EM | Admit: 2012-06-13 | Discharge: 2012-06-13 | Disposition: A | Discharge status: Home / Self Care | Payer: BC Managed Care – PPO | Source: Home / Self Care

## 2012-06-13 ENCOUNTER — Encounter: Payer: Self-pay | Admitting: *Deleted

## 2012-06-13 DIAGNOSIS — H6690 Otitis media, unspecified, unspecified ear: Secondary | ICD-10-CM

## 2012-06-13 DIAGNOSIS — J069 Acute upper respiratory infection, unspecified: Secondary | ICD-10-CM

## 2012-06-13 MED ORDER — AMOXICILLIN 500 MG PO CAPS: 1000.0000 mg | ORAL_CAPSULE | Freq: Three times a day (TID) | ORAL | Status: AC

## 2012-06-13 NOTE — ED Notes (Signed)
Patient c/o congestion, sinus pain, HA, productive cough, right ear pain x 1 week. Taken sudafed OTC. Patient is breastfeeding.

## 2012-06-13 NOTE — ED Provider Notes (Signed)
History     CSN: [id number]  Arrival date & time 06/13/12  0818   First MD Initiated Contact with Patient 06/13/12 [phone]      Chief Complaint  Patient presents with  . Nasal Congestion  . Cough   HPI SINUSITIS Onset:  1 week Location: maxillary sinuses  Description:rhinorrhea, nasal congestion, sinus pressure, R ear pain   Modifying factors: multiple sick contacts at home  Symptoms Cough:  mild Discharge:  yes Fever: no Sinus Pressure:  no Ears Blocked:  No Dyspnea: no Teeth Ache:  no Frontal Headache:  mild Second Sickening:  No Ear Pain: R sided   Red Flags Change in mental state: no Change in vision: no    Past Medical History  Diagnosis Date  . PCOS (polycystic ovarian syndrome)   . IBS (irritable bowel syndrome)   . Abnormal Pap smear and cervical HPV (human papillomavirus)   . Generalized anxiety disorder     Past Surgical History  Procedure Date  . Colposcopy   . Gynecologic cryosurgery   . Cholecystectomy   . Dilation and curettage of uterus   . Knee surgery right    Family History  Problem Relation Age of Onset  . Hypertension Father   . Diabetes Paternal Grandmother   . Hypertension Paternal Grandmother   . Stroke Paternal Grandmother   . Diabetes Paternal Grandfather   . Hypertension Paternal Grandfather   . Stroke Paternal Grandfather   . Hypertension Mother     History  Substance Use Topics  . Smoking status: Never Smoker   . Smokeless tobacco: Never Used  . Alcohol Use: No    OB History    Grav Para Term Preterm Abortions TAB SAB Ect Mult Living   2 1 1  1  1   1       Review of Systems  All other systems reviewed and are negative.    Allergies  Codeine; Morphine and related; Robitussin a-c; and Shrimp  Home Medications   Current Outpatient Rx  Name Route Sig Dispense Refill  . CETIRIZINE HCL 10 MG PO TABS Oral Take 10 mg by mouth daily.    Vladimir Creeks ESTRAD-FE 1-20/1-30/1-35 MG-MCG PO TABS Oral Take 1  tablet by mouth daily.    . AMOXICILLIN 500 MG PO CAPS Oral Take 2 capsules (1,000 mg total) by mouth 3 (three) times daily. 60 capsule 0  . ESCITALOPRAM OXALATE 10 MG PO TABS Oral Take 10 mg by mouth daily.      Marland Kitchen CITRANATAL 90 DHA PO Oral Take 1 tablet by mouth daily.        BP 117/83  Pulse 112  Temp 98.1 F (36.7 C) (Oral)  Resp 16  Ht 5' 4.5" (1.638 m)  Wt 270 lb (122.471 kg)  BMI 45.63 kg/m2  SpO2 98%  Breastfeeding? Yes  Physical Exam  Constitutional: She appears well-developed and well-nourished.  HENT:  Head: Normocephalic and atraumatic.  Left Ear: External ear normal.       +nasal erythema, rhinorrhea bilaterally, + post oropharyngeal erythema   R TM bulging and erythema    Eyes: Conjunctivae are normal. Pupils are equal, round, and reactive to light.  Neck: Normal range of motion. Neck supple.  Cardiovascular: Normal rate and regular rhythm.   Pulmonary/Chest: Effort normal and breath sounds normal. She has no wheezes. She has no rales.  Abdominal: Soft. Bowel sounds are normal.  Musculoskeletal: Normal range of motion.  Lymphadenopathy:    She has no cervical adenopathy.  Neurological: She is alert.  Skin: Skin is warm.    ED Course  Procedures (including critical care time)  Labs Reviewed - No data to display No results found.   1. URI (upper respiratory infection)   2. Otitis media       MDM  Will treat with amoxicillin at otitis media dosing.  Pt is breast feeding. This is safe in this group. Category B.  Discussed infectious red flags for reevaluation including SOB, fever, or worsening sxs despite treatment.  Follow up as needed.      The patient and/or caregiver has been counseled thoroughly with regard to treatment plan and/or medications prescribed including dosage, schedule, interactions, rationale for use, and possible side effects and they verbalize understanding. Diagnoses and expected course of recovery discussed and will return if  not improved as expected or if the condition worsens. Patient and/or caregiver verbalized understanding.             Doree Albee, MD 06/13/12 [phone]

## 2012-06-30 ENCOUNTER — Encounter: Payer: Self-pay | Admitting: Family Medicine

## 2012-06-30 ENCOUNTER — Ambulatory Visit (INDEPENDENT_AMBULATORY_CARE_PROVIDER_SITE_OTHER): Payer: BC Managed Care – PPO | Admitting: Family Medicine

## 2012-06-30 VITALS — BP 110/78 | HR 91 | Temp 97.9°F | Ht 65.25 in | Wt 270.0 lb

## 2012-06-30 DIAGNOSIS — Z23 Encounter for immunization: Secondary | ICD-10-CM

## 2012-06-30 DIAGNOSIS — R05 Cough: Secondary | ICD-10-CM

## 2012-06-30 DIAGNOSIS — F419 Anxiety disorder, unspecified: Secondary | ICD-10-CM | POA: Insufficient documentation

## 2012-06-30 DIAGNOSIS — F411 Generalized anxiety disorder: Secondary | ICD-10-CM

## 2012-06-30 DIAGNOSIS — R059 Cough, unspecified: Secondary | ICD-10-CM

## 2012-06-30 DIAGNOSIS — E282 Polycystic ovarian syndrome: Secondary | ICD-10-CM | POA: Insufficient documentation

## 2012-06-30 DIAGNOSIS — Z298 Encounter for other specified prophylactic measures: Secondary | ICD-10-CM

## 2012-06-30 DIAGNOSIS — J0100 Acute maxillary sinusitis, unspecified: Secondary | ICD-10-CM | POA: Insufficient documentation

## 2012-06-30 DIAGNOSIS — E669 Obesity, unspecified: Secondary | ICD-10-CM

## 2012-06-30 NOTE — Patient Instructions (Signed)
Weight Loss Medications after breastfeeding is over: Phentermine or Qsymia

## 2012-06-30 NOTE — Progress Notes (Signed)
CC: Barbara Mcmahon is a 36 y.o. female is here for Establish Care   Subjective: HPI:  Pleasant 36 year old here to establish care. Prior care provided by her OB/GYN, she delivered for months ago, Barbara Mcmahon is a new healthy daughter.  She describes a lingering cough has been present for almost 3 weeks now. She was diagnosed with right otitis media early in September and was put on amoxicillin. Since then she has a slightly productive cough it seems to be getting better a weekly basis she is taking some time. There been no fevers, chills, shortness of breath, wheezing, chest pain, nasal congestion, or sinus pain. No over-the-counter interventions at this time. Denies ear pain or hearing loss.  She describes trouble maintaining a healthy weight that has been bothering her for decades, the lowest weight that she's gotten to is in the 190s and this was after one year of intense six-day week 1 hour day exercise regimens. Since marriage and in a mother of 2 children she is unable to fit this exercise regimen back into her daily routines. She tries to watch what she eats and avoids fatty foods avoids fast foods and tries hard at focusing on portion control. She's not in any current exercise regimen. She is breast-feeding and has noticed only mild weight loss. She carries a diagnosis of PCO S. and was on metformin just prior to her pregnancy. She is interested in weight loss medication options.  No history of thyroid abnormalities  She describes herself as a perfectionist and had trouble years ago with trying to manage the obligations of being a wife, mother, and employee with respect to trying to make these aspects of perfect. Because of this she was placed on Lexapro and has had fantastic results. She notes that since the birth of her daughter she's having some of her symptoms a focusing on perfectionism returning. She wonders if she needs to have her Lexapro dose adjusted. She denies depression, paranoia,  mental disturbance.  Review Of Systems Outlined In HPI  Past Medical History  Diagnosis Date  . PCOS (polycystic ovarian syndrome)   . IBS (irritable bowel syndrome)   . Abnormal Pap smear and cervical HPV (human papillomavirus)   . Generalized anxiety disorder      Family History  Problem Relation Age of Onset  . Hypertension Father   . Diabetes Paternal Grandmother   . Hypertension Paternal Grandmother   . Stroke Paternal Grandmother   . Diabetes Paternal Grandfather   . Hypertension Paternal Grandfather   . Stroke Paternal Grandfather   . Hypertension Mother      History  Substance Use Topics  . Smoking status: Never Smoker   . Smokeless tobacco: Never Used  . Alcohol Use: No     Objective: Filed Vitals:   06/30/12 0823  BP: 110/78  Pulse: 91  Temp: 97.9 F (36.6 C)    General: Alert and Oriented, No Acute Distress HEENT: Pupils equal, round, reactive to light. Conjunctivae clear.  External ears unremarkable, canals clear with intact TMs with appropriate landmarks.  Middle ear appears open without effusion. Pink inferior turbinates.  Moist mucous membranes, pharynx without inflammation nor lesions.  Neck supple without palpable lymphadenopathy nor abnormal masses. Lungs: Clear to auscultation bilaterally, no wheezing/ronchi/rales.  Comfortable work of breathing. Good air movement. Cardiac: Regular rate and rhythm. Normal S1/S2.  No murmurs, rubs, nor gallops.   Mental Status: No depression, anxiety, nor agitation. Skin: Warm and dry.  Assessment & Plan: Barbara Mcmahon was seen today for  establish care.  Diagnoses and associated orders for this visit:  Obesity  Cough  Anxiety  Need for prophylactic immunotherapy - Flu vaccine greater than or equal to 3yo with preservative IM    We discussed medication for weight loss however since she is breast-feeding until next summer she'll use this time to research options and we'll avoid medications at this time but  will continue to focus on physical activity and dietary measures. I pointed out that Lexapro has inadequate literature available to assess risks while taking while breast-feeding therefore didn't feel it was reasonable to make any increased adjustment at this time, she is in agreement with this plan. Discussed with her that her cough is most likely post viral irritation and this should improve after total of 3 weeks when she came that with her illness.Signs and symptoms requring emergent/urgent reevaluation were discussed with the patient. Flu shot given today she or he had pertussis vaccine.  Return in about 6 months (around 12/28/2012).

## 2012-07-04 ENCOUNTER — Encounter: Payer: Self-pay | Admitting: Family Medicine

## 2012-07-04 DIAGNOSIS — K580 Irritable bowel syndrome with diarrhea: Secondary | ICD-10-CM

## 2012-07-07 ENCOUNTER — Telehealth: Payer: Self-pay | Admitting: *Deleted

## 2012-07-07 NOTE — Telephone Encounter (Signed)
Pt informed and will call back in am to make an appt b/c unable to schedule due to upgrade.

## 2012-07-07 NOTE — Telephone Encounter (Signed)
I'd encourage that she come by for a visit, I would typically just call in a Rx but since she's breast feeding I'd like to know exactly what we're dealing with instead of making an assumption.

## 2012-07-07 NOTE — Telephone Encounter (Signed)
Pt called stating you mentioned her cough should go away. States her cough has gotten worse and would like to know what she should do or if you need to see her.

## 2012-07-09 ENCOUNTER — Ambulatory Visit (INDEPENDENT_AMBULATORY_CARE_PROVIDER_SITE_OTHER): Payer: BC Managed Care – PPO | Admitting: Family Medicine

## 2012-07-09 ENCOUNTER — Encounter: Payer: Self-pay | Admitting: Family Medicine

## 2012-07-09 VITALS — BP 139/81 | HR 84 | Temp 97.9°F | Wt 273.0 lb

## 2012-07-09 DIAGNOSIS — J40 Bronchitis, not specified as acute or chronic: Secondary | ICD-10-CM

## 2012-07-09 DIAGNOSIS — R0982 Postnasal drip: Secondary | ICD-10-CM

## 2012-07-09 MED ORDER — CICLESONIDE 37 MCG/ACT NA AERS: 1.0000 | INHALATION_SPRAY | Freq: Every day | NASAL | Status: DC

## 2012-07-09 MED ORDER — BENZONATATE 100 MG PO CAPS: 100.0000 mg | ORAL_CAPSULE | Freq: Three times a day (TID) | ORAL | Status: DC | PRN

## 2012-07-09 MED ORDER — AZITHROMYCIN 250 MG PO TABS: ORAL_TABLET | ORAL | Status: AC

## 2012-07-09 NOTE — Progress Notes (Signed)
CC: Barbara Mcmahon is a 36 y.o. female is here for Cough   Subjective: HPI:  Patient presents due to continued cough, less in September 23 since then cough has become more productive but still predominantly dry. Present 24 hours a day worse at night when lying down. Sputum is described as somewhat yellow when present. Mild chest discomfort in the sternal area only when coughing. No sinus pressure nor fevers or chills or headaches. Admits to some mild anterior clear nasal drainage and a sensation of drainage down the back of her throat worse when lying down. Denies rashes, neck pain, dysphagia, shortness of breath irregular heartbeat, back pain, orthopnea. No interventions as of yet. She is breast-feeding her daughter, Barbara Mcmahon.   Review Of Systems Outlined In HPI  Past Medical History  Diagnosis Date  . PCOS (polycystic ovarian syndrome)   . IBS (irritable bowel syndrome)   . Abnormal Pap smear and cervical HPV (human papillomavirus)   . Generalized anxiety disorder   . Seizure disorder      Family History  Problem Relation Age of Onset  . Hypertension Father   . Diabetes Paternal Grandmother   . Hypertension Paternal Grandmother   . Stroke Paternal Grandmother   . Diabetes Paternal Grandfather   . Hypertension Paternal Grandfather   . Stroke Paternal Grandfather   . Hypertension Mother      History  Substance Use Topics  . Smoking status: Never Smoker   . Smokeless tobacco: Never Used  . Alcohol Use: No     Objective: Filed Vitals:   07/09/12 1141  BP: 139/81  Pulse: 84  Temp: 97.9 F (36.6 C)    General: Alert and Oriented, No Acute Distress HEENT: Pupils equal, round, reactive to light. Conjunctivae clear.  External ears unremarkable, canals clear with intact TMs with appropriate landmarks.  Middle ear appears open without effusion. Pink inferior turbinates.  Moist mucous membranes, pharynx without inflammation however mild to moderate cobblestoning.  Neck supple  without palpable lymphadenopathy nor abnormal masses. Lungs: Clear to auscultation bilaterally, no wheezing/ronchi/rales.  Comfortable work of breathing. Good air movement. Cardiac: Regular rate and rhythm. Normal S1/S2.  No murmurs, rubs, nor gallops.  . Extremities: No peripheral edema.  Strong peripheral pulses.    Assessment & Plan: Barbara Mcmahon was seen today for cough.  Diagnoses and associated orders for this visit:  Bronchitis - azithromycin (ZITHROMAX) 250 MG tablet; Take two tabs at once on day 1, then one tab daily on days 2-5. - benzonatate (TESSALON PERLES) 100 MG capsule; Take 1 capsule (100 mg total) by mouth 3 (three) times daily as needed for cough.  Postnasal drip - Ciclesonide (ZETONNA) 37 MCG/ACT AERS; Place 1 spray into the nose daily.  At the moment no signs of bacterial infection, however do feel the cough is influenced by postnasal drip, she had inadequate response to Flonase in the past. Constellation of symptoms are similar to what she experiences every year around this time. Symptomatic control with humidifier, and Tessalon Perles only as needed, start daily zetonna trial, if no better in 3-4 days or fever or productive cough worsens start azithromycin.Signs and symptoms requring emergent/urgent reevaluation were discussed with the patient.     Return if symptoms worsen or fail to improve.

## 2012-10-16 ENCOUNTER — Ambulatory Visit (INDEPENDENT_AMBULATORY_CARE_PROVIDER_SITE_OTHER): Payer: BC Managed Care – PPO | Admitting: Family Medicine

## 2012-10-16 ENCOUNTER — Other Ambulatory Visit: Payer: Self-pay | Admitting: Family Medicine

## 2012-10-16 ENCOUNTER — Encounter: Payer: Self-pay | Admitting: Family Medicine

## 2012-10-16 VITALS — BP 104/62 | HR 104 | Resp 16 | Wt 271.0 lb

## 2012-10-16 DIAGNOSIS — M545 Low back pain, unspecified: Secondary | ICD-10-CM

## 2012-10-16 DIAGNOSIS — F430 Acute stress reaction: Secondary | ICD-10-CM

## 2012-10-16 DIAGNOSIS — R197 Diarrhea, unspecified: Secondary | ICD-10-CM

## 2012-10-16 MED ORDER — SERTRALINE HCL 50 MG PO TABS: ORAL_TABLET | ORAL | Status: DC

## 2012-10-16 NOTE — Progress Notes (Signed)
Subjective:    Patient ID: Barbara Mcmahon, female    DOB: 1976/04/05, 37 y.o.   MRN: [id number]  HPI 3 days of diarrhea, stools are now yellow.  Had dumping problems after cholecystectomy, says usuallly well controlled with her diet.  She is also under laot of stress.  Some cramping with it.  SOme nausea, but not vomiting.  No fever.  No blood in the stool. She feels really fatigued.  Pain in her low back.  Had a severe infection of the colon after a trip to [place].  Has some sinus symptolms for about 2-3 weeks.  Mild congestion and post nasal drip.  No other sick contacts. Did eat out but no one else is sick.  No recent antibiotics.  She's also been under a lot of stress recently. Unfortunately she had a miscarriage in June of 2012. She has a new baby but daughter that was born in May of 2013 is still nursing. Her job has been very stressful for her and she's been back to work since July 2013. She is also dealing with a son with autism and ADHD and anxiety disorder he blows up on a daily basis.  Review of Systems     BP 104/62  Pulse 104  Resp 16  Wt 271 lb (122.925 kg)  SpO2 96%    Allergies  Allergen Reactions  . Codeine Hives and Itching    Pt says she can take percocet  . Morphine And Related Other (See Comments)    Big mood changes  . Robitussin A-C (Guaifenesin-Codeine) Itching  . Shrimp (Shellfish Allergy) Hives and Swelling    Throat closes    Past Medical History  Diagnosis Date  . PCOS (polycystic ovarian syndrome)   . IBS (irritable bowel syndrome)   . Abnormal Pap smear and cervical HPV (human papillomavirus)   . Generalized anxiety disorder   . Seizure disorder     Past Surgical History  Procedure Date  . Colposcopy   . Gynecologic cryosurgery   . Cholecystectomy   . Dilation and curettage of uterus   . Knee surgery right    History   Social History  . Marital Status: Married    Spouse Name: N/A    Number of Children: N/A  . Years of Education: N/A    Occupational History  . Not on file.   Social History Main Topics  . Smoking status: Never Smoker   . Smokeless tobacco: Never Used  . Alcohol Use: No  . Drug Use: No  . Sexually Active: Not Currently   Other Topics Concern  . Not on file   Social History Narrative  . No narrative on file    Family History  Problem Relation Age of Onset  . Hypertension Father   . Diabetes Paternal Grandmother   . Hypertension Paternal Grandmother   . Stroke Paternal Grandmother   . Diabetes Paternal Grandfather   . Hypertension Paternal Grandfather   . Stroke Paternal Grandfather   . Hypertension Mother     Outpatient Encounter Prescriptions as of 10/16/2012  Medication Sig Dispense Refill  . escitalopram (LEXAPRO) 10 MG tablet Take 10 mg by mouth daily.        . norethindrone-ethinyl estradiol-iron (ESTROSTEP FE,TILIA FE,TRI-LEGEST FE) 1-20/1-30/1-35 MG-MCG tablet Take 1 tablet by mouth daily.      . cetirizine (ZYRTEC) 10 MG tablet Take 10 mg by mouth daily.      . sertraline (ZOLOFT) 50 MG tablet 1/2 tab daily for one  week, then whole tab  30 tablet  0  . [DISCONTINUED] benzonatate (TESSALON PERLES) 100 MG capsule Take 1 capsule (100 mg total) by mouth 3 (three) times daily as needed for cough.  30 capsule  0  . [DISCONTINUED] Ciclesonide (ZETONNA) 37 MCG/ACT AERS Place 1 spray into the nose daily.  1 Inhaler  0  . [DISCONTINUED] Prenat w/o A-FeCbn-DSS-FA-DHA (CITRANATAL 90 DHA PO) Take 1 tablet by mouth daily.             Objective:   Physical Exam  Constitutional: She is oriented to person, place, and time. She appears well-developed and well-nourished.  HENT:  Head: Normocephalic and atraumatic.  Right Ear: External ear normal.  Left Ear: External ear normal.  Nose: Nose normal.  Mouth/Throat: Oropharynx is clear and moist.  Neck: Neck supple. No thyromegaly present.  Cardiovascular: Normal rate, regular rhythm and normal heart sounds.   Pulmonary/Chest: Effort normal and  breath sounds normal.  Abdominal: Soft. Bowel sounds are normal. She exhibits no distension and no mass. There is tenderness. There is no rebound and no guarding.       Mild tenderness diffusely. Increased tenderness in the left lower quadrant. No masses.  Lymphadenopathy:    She has no cervical adenopathy.  Neurological: She is alert and oriented to person, place, and time.  Skin: Skin is warm and dry.  Psychiatric: She has a normal mood and affect. Her behavior is normal.          Assessment & Plan:  Diarrhea x3 days-unclear if this could be stress related or possibly more of an infectious disease. Could be viral versus bacterial. No known food exposures though she does eat out daily. I would like to get a stool culture. Fortunately she has not had blood in the stool which means Salmonella, Shigella and Escherichia coli are less likely. I would like to get a urinalysis and she's also had some low back pain just make sure she's not developing urinary tract infection from the frequent diarrhea. I encouraged her to stay hydrated and can eat things like rice to help in the stools. Other products vitamin E which can often prolonged illness. Also check a CBC. Her white count elevated consider treatment with ciprofloxacin. No other members of the home has been sick. No family history of Crohn's or ulcerative colitis. This is also not been a recurring event to suspect that she may have some type of autoimmune disorder.  Acute situational stress-she seems to be very overwhelmed and anxious. Her gad 7 score was 11 today. She says this is quite high for her. We discussed different options. She says that she's been Dr. him about possibly switching her medication. She would like to do this at this point in time. We will wean citalopram and change her to sertraline. Sertraline is considered relatively safe all nursing. She says in fact she's investigated the drug on her own onlineand feels happy with that  choice. We discussed potential side effects of the medication. She will followup with Dr. Ivan Anchors once she has been on the medication for 3 weeks.  Time spent 25 minutes, greater than 50% of time discussing her diarrhea and her stress.

## 2012-10-16 NOTE — Patient Instructions (Addendum)
Decrease her Lexapro to half a tab. Take one half tab daily for one week. Didn't take one half daily every other day for one week. When you get to the every other day dosing he can actually go ahead and start the sertraline. They will overlap for one week. He may still feel a little strange during the transition period but this should get better fairly quickly. Followup with Dr. Ivan Anchors, once you have been on the new medication for 3 weeks. Avoid products like a medium. Make sure you're staying well hydrated. Eat a half a cup or ice twice a day.  Diarrhea Infections caused by germs (bacterial) or a virus commonly cause diarrhea. Your caregiver has determined that with time, rest and fluids, the diarrhea should improve. In general, eat normally while drinking more water than usual. Although water may prevent dehydration, it does not contain salt and minerals (electrolytes). Broths, weak tea without caffeine and oral rehydration solutions (ORS) replace fluids and electrolytes. Small amounts of fluids should be taken frequently. Large amounts at one time may not be tolerated. Plain water may be harmful in infants and the elderly. Oral rehydrating solutions (ORS) are available at pharmacies and grocery stores. ORS replace water and important electrolytes in proper proportions. Sports drinks are not as effective as ORS and may be harmful due to sugars worsening diarrhea.  ORS is especially recommended for use in children with diarrhea. As a general guideline for children, replace any new fluid losses from diarrhea and/or vomiting with ORS as follows:   If your child weighs 22 pounds or under (10 kg or less), give 60-120 mL ( -  cup or 2 - 4 ounces) of ORS for each episode of diarrheal stool or vomiting episode.   If your child weighs more than 22 pounds (more than 10 kgs), give 120-240 mL ( - 1 cup or 4 - 8 ounces) of ORS for each diarrheal stool or episode of vomiting.   While correcting for  dehydration, children should eat normally. However, foods high in sugar should be avoided because this may worsen diarrhea. Large amounts of carbonated soft drinks, juice, gelatin desserts and other highly sugared drinks should be avoided.   After correction of dehydration, other liquids that are appealing to the child may be added. Children should drink small amounts of fluids frequently and fluids should be increased as tolerated. Children should drink enough fluids to keep urine clear or pale yellow.   Adults should eat normally while drinking more fluids than usual. Drink small amounts of fluids frequently and increase as tolerated. Drink enough fluids to keep urine clear or pale yellow. Broths, weak decaffeinated tea, lemon lime soft drinks (allowed to go flat) and ORS replace fluids and electrolytes.   Avoid:   Carbonated drinks.   Juice.   Extremely hot or cold fluids.   Caffeine drinks.   Fatty, greasy foods.   Alcohol.   Tobacco.   Too much intake of anything at one time.   Gelatin desserts.   Probiotics are active cultures of beneficial bacteria. They may lessen the amount and number of diarrheal stools in adults. Probiotics can be found in yogurt with active cultures and in supplements.   Wash hands well to avoid spreading bacteria and virus.   Anti-diarrheal medications are not recommended for infants and children.   Only take over-the-counter or prescription medicines for pain, discomfort or fever as directed by your caregiver. Do not give aspirin to children because it may cause Reye's  Syndrome.   For adults, ask your caregiver if you should continue all prescribed and over-the-counter medicines.   If your caregiver has given you a follow-up appointment, it is very important to keep that appointment. Not keeping the appointment could result in a chronic or permanent injury, and disability. If there is any problem keeping the appointment, you must call back to this  facility for assistance.  SEEK IMMEDIATE MEDICAL CARE IF:    You or your child is unable to keep fluids down or other symptoms or problems become worse in spite of treatment.   Vomiting or diarrhea develops and becomes persistent.   There is vomiting of blood or bile (green material).   There is blood in the stool or the stools are black and tarry.   There is no urine output in 6-8 hours or there is only a small amount of very dark urine.   Abdominal pain develops, increases or localizes.   You have a fever.   Your baby is older than 3 months with a rectal temperature of 102 F (38.9 C) or higher.   Your baby is 75 months old or younger with a rectal temperature of 100.4 F (38 C) or higher.   You or your child develops excessive weakness, dizziness, fainting or extreme thirst.   You or your child develops a rash, stiff neck, severe headache or become irritable or sleepy and difficult to awaken.  MAKE SURE YOU:    Understand these instructions.   Will watch your condition.   Will get help right away if you are not doing well or get worse.  Document Released: 09/14/2002 Document Revised: 12/17/2011 Document Reviewed: 08/01/2009 [facility] Patient Information 2013 [place], [place].

## 2012-10-17 ENCOUNTER — Telehealth: Payer: Self-pay | Admitting: *Deleted

## 2012-10-17 LAB — COMPLETE METABOLIC PANEL WITH GFR
ALT: 27 U/L (ref 0–35)
AST: 23 U/L (ref 0–37)
Alkaline Phosphatase: 121 U/L — ABNORMAL HIGH (ref 39–117)
BUN: 10 mg/dL (ref 6–23)
Creat: 0.69 mg/dL (ref 0.50–1.10)
Potassium: 4.2 mEq/L (ref 3.5–5.3)

## 2012-10-17 LAB — URINALYSIS
Bilirubin Urine: NEGATIVE
Glucose, UA: NEGATIVE mg/dL
pH: 6 (ref 5.0–8.0)

## 2012-10-17 LAB — CBC WITH DIFFERENTIAL/PLATELET
Basophils Absolute: 0 10*3/uL (ref 0.0–0.1)
Basophils Relative: 1 % (ref 0–1)
Eosinophils Relative: 0 % (ref 0–5)
HCT: 37.1 % (ref 36.0–46.0)
Lymphocytes Relative: 24 % (ref 12–46)
MCHC: 34.8 g/dL (ref 30.0–36.0)
Monocytes Absolute: 0.6 10*3/uL (ref 0.1–1.0)
Neutro Abs: 5.8 10*3/uL (ref 1.7–7.7)
Platelets: 304 10*3/uL (ref 150–400)
RDW: 15 % (ref 11.5–15.5)
WBC: 8.6 10*3/uL (ref 4.0–10.5)

## 2012-10-17 NOTE — Telephone Encounter (Signed)
Message copied by Edilia Bo on Fri Oct 17, 2012 11:56 AM ------      Message from: Nani Gasser D      Created: Fri Oct 17, 2012  9:07 AM       Call labs and see if they can add a urine culture. Please call patient learn her white count looks great. In the normal range which is very reassuring. Her alkaline phosphatase is slightly elevated. This often comes from the gut. Out like to recheck this in 2 weeks to make sure that it's going back down to normal. Encouraged her to eat things like rice in addition to her yogurt. We are sending her urine for a full culture. This should be back on Monday. Make sure drinking plenty of fluids if she doesn't get dehydrated from the diarrhea.

## 2012-10-17 NOTE — ED Notes (Signed)
Patient informed of lab results and to have alkaline phosphate re-checked in 2 weeks. Ucx was added to urine with [facility]. Continue with yogurt and eat rice, drink plenty of fluids.

## 2012-10-19 ENCOUNTER — Other Ambulatory Visit: Payer: Self-pay | Admitting: Family Medicine

## 2012-10-19 LAB — URINE CULTURE: Colony Count: >=100000

## 2012-10-19 MED ORDER — CIPROFLOXACIN HCL 500 MG PO TABS: 500.0000 mg | ORAL_TABLET | Freq: Two times a day (BID) | ORAL | Status: AC

## 2012-10-27 ENCOUNTER — Ambulatory Visit (INDEPENDENT_AMBULATORY_CARE_PROVIDER_SITE_OTHER): Payer: BC Managed Care – PPO | Admitting: Physician Assistant

## 2012-10-27 ENCOUNTER — Encounter: Payer: Self-pay | Admitting: Physician Assistant

## 2012-10-27 VITALS — BP 125/74 | HR 90 | Wt 276.0 lb

## 2012-10-27 DIAGNOSIS — I7300 Raynaud's syndrome without gangrene: Secondary | ICD-10-CM

## 2012-10-27 MED ORDER — NIFEDIPINE ER OSMOTIC RELEASE 30 MG PO TB24: 30.0000 mg | ORAL_TABLET | Freq: Every day | ORAL | Status: DC

## 2012-10-27 NOTE — Progress Notes (Signed)
  Subjective:    Patient ID: Barbara Mcmahon, female    DOB: 10-Jul-1976, 37 y.o.   MRN: [id number]  HPI Patient is a 37 yo female who presents to the clinic with bilateral breast pain and nipples changing colors to white and then back to flesh tone. For about 2 weeks patient has experienced pain while and long after breast feeding. She has also noticed that her nipples become white while breast feeding and then after a while the color appears. She has not noticed any discharge, warmth from breast, redness around skin. They are not tender to touch and baby has not had thrush. Pain is intense and shooting about 7/10. Baby has had good latch from beginning. She has not tried anything to make better. She has not had any fever, chills, muscle aches.    Review of Systems     Objective:   Physical Exam  Constitutional: She appears well-developed.  Genitourinary:       NO tenderness with palpation of bilateral breast. Skin normal to appearance. NO warmth or redness. Nipple normal color. NO discharge to be expressed.   Psychiatric: She has a normal mood and affect. Her behavior is normal.          Assessment & Plan:  Raynauds phenomenon- I do not think is thrust at this point from HPI. I feel like it is consistent with vasospasms of nipples. Gave handout of symptomatic treatment she could try to see if relieved the pain. If not gave CCB to try for 2 weeks. Call if not improving.

## 2012-10-27 NOTE — Patient Instructions (Addendum)
Treatments for Raynaud's phenomenon (blanching of the nipple) 1. Identify and Fix the original cause of the pain: i.e. Poor Latching and/or Candida. 2. Stop Air Drying of the nipples. When baby comes off the breast, immediately cover the nipple with your warm hand while you get your bra done up. After talking a shower, avoid going out of the shower enclosure until the breasts are completely covered and kept warmed so the cold air cannot reach the nipples. 3. The All Purpose Nipple Ointment may also help for the soreness during the feeding, especially when ibuprofen powder has been mixed in. See the information sheets Candida Protocol and All Purpose Nipple Ointment. 4. Olive Oil Warming olive oil in mother's fingers and then gently massaging the oil into the nipples during the burning may be very soothing. We have heard from many mothers that this gave them instant relief and seemed to decrease the occurrence of the vasospasm overall. It's important that the oil be really massaged into the nipples and not just dabbed on 5. Vitamin B6 Multi Complex. There have not yet been studies done to show that vitamin B6 works, but enough anecdotal evidence has come forward to support that it does work at least some of the time. It is safe and will do no harm. It is best that B6 not be taken on its own but instead as part of a B complex of vitamins that includes niacin. Depending on the overall dose of the B complex, the amount of B6 itself should be approximately 100 mg 2x/day for at least a couple of weeks. So, for example, if the overall capsule is 125 mg of B complex and there is only 50 mg of B6 in that capsule, then mother would need to take 2 capsules at a time to equal one dose and that dose would need to be taken 2x/day. The mother continues it until she is pain free for a few weeks. It can be restarted if necessary. If you have been pain free for a week or two, try going off the vitamin B6. If vitamin B6 does not  work within a week, it probably won't. 6. Warm dry compresses can be very effective at stopping the vasospasm as it is occurring and for treating the pain. Lying down after a feeding and applying a heating pad to the breasts for a few minutes or more may help considerably. Certainly, it will allow mother to rest and this may help to deal with the pain, as well. 7. Magnesium supplements with added Calcium taken as 2 teaspoons (300mg  Magnesium/200 mg Calcium (gluconate), or taken separately: 2x daily or 300mg  Magnesium 2x daily & 200mg  of Calcium.) 8. After working on the latch, possibly as effective as all of the above is the massaging of the chest muscles which are below the collar bone and above the breasts after the feedings or at onset of nipple or breast pain. The massage should be very vigorous and firm and is done on the chest, not necessarily the breasts. The mother could also massage under the pectoral muscles, in her armpits, but this massage should be done gently.

## 2012-11-07 ENCOUNTER — Telehealth: Payer: Self-pay | Admitting: Family Medicine

## 2012-11-07 DIAGNOSIS — I7300 Raynaud's syndrome without gangrene: Secondary | ICD-10-CM

## 2012-11-19 ENCOUNTER — Encounter: Payer: Self-pay | Admitting: Family Medicine

## 2012-11-19 DIAGNOSIS — I7300 Raynaud's syndrome without gangrene: Secondary | ICD-10-CM | POA: Insufficient documentation

## 2012-11-30 ENCOUNTER — Other Ambulatory Visit: Payer: Self-pay | Admitting: Family Medicine

## 2013-01-13 ENCOUNTER — Encounter: Payer: Self-pay | Admitting: Family Medicine

## 2013-01-13 ENCOUNTER — Ambulatory Visit (INDEPENDENT_AMBULATORY_CARE_PROVIDER_SITE_OTHER): Payer: BC Managed Care – PPO | Admitting: Family Medicine

## 2013-01-13 VITALS — BP 122/81 | HR 82 | Ht 65.25 in | Wt 268.0 lb

## 2013-01-13 DIAGNOSIS — S76011A Strain of muscle, fascia and tendon of right hip, initial encounter: Secondary | ICD-10-CM

## 2013-01-13 DIAGNOSIS — IMO0002 Reserved for concepts with insufficient information to code with codable children: Secondary | ICD-10-CM

## 2013-01-13 DIAGNOSIS — E669 Obesity, unspecified: Secondary | ICD-10-CM

## 2013-01-13 DIAGNOSIS — Z309 Encounter for contraceptive management, unspecified: Secondary | ICD-10-CM

## 2013-01-13 MED ORDER — MELOXICAM 15 MG PO TABS: 15.0000 mg | ORAL_TABLET | Freq: Every day | ORAL | Status: DC

## 2013-01-13 MED ORDER — PHENTERMINE HCL 37.5 MG PO CAPS: 37.5000 mg | ORAL_CAPSULE | ORAL | Status: DC

## 2013-01-13 NOTE — Progress Notes (Signed)
CC: Barbara Mcmahon is a 37 y.o. female is here for discuss changing BCP, trying to lose wt and Hip Pain   Subjective: HPI:  Patient presents for contraceptive counseling. She's no longer lactating or breast-feeding. She's currently taking the minipill on a daily basis within one hour of the exact daily dosing. She would like to know if there are other oral contraceptive options. She has not had a period since 2012, no vaginal bleeding since days after the birth of her child 11 months ago. She finds the lack of cyclical menstruation quite appealing due to history of dysmenorrhea. She denies migraines, tobacco use, nor history of thrombosis.  Patient complains of right hip pain that has been present for 2-4 weeks. She woke up with this weeks after engaging in a running regimen for help in trying to lose weight. Pain is described as sharp on the front of the right hip. It is present with rest and activity, while running pain remains at baseline however the day after pain seems exacerbated from a mild to moderate severity. No interventions as of yet, nonradiating, has not gotten better or worse since onset other than days after running. Described as sharp has never had this before. No interventions as of yet. Patient denies dysuria, pelvic pain, genitourinary complaints, diarrhea, constipation, abdominal pain  Patient complains of trouble losing weight despite cutting back calories and increasing physical activity with running regimen. Interventions have been going on for the past 11 months.  Reports trouble keeping weight off for the past 2 years.  Is interested in medical therapy. Denies personal history or family history of cardiac disease nor irregular heartbeat. Denies anxiety or current mental disturbance     Review Of Systems Outlined In HPI  Past Medical History  Diagnosis Date  . PCOS (polycystic ovarian syndrome)   . IBS (irritable bowel syndrome)   . Abnormal Pap smear and cervical HPV  (human papillomavirus)   . Generalized anxiety disorder   . Seizure disorder      Family History  Problem Relation Age of Onset  . Hypertension Father   . Diabetes Paternal Grandmother   . Hypertension Paternal Grandmother   . Stroke Paternal Grandmother   . Diabetes Paternal Grandfather   . Hypertension Paternal Grandfather   . Stroke Paternal Grandfather   . Hypertension Mother      History  Substance Use Topics  . Smoking status: Never Smoker   . Smokeless tobacco: Never Used  . Alcohol Use: No     Objective: Filed Vitals:   01/13/13 1409  BP: 122/81  Pulse: 82    General: Alert and Oriented, No Acute Distress HEENT: Pupils equal, round, reactive to light. Conjunctivae clear.  Moist mucous membranes Lungs: Clear to auscultation bilaterally, no wheezing/ronchi/rales.  Comfortable work of breathing. Good air movement. Cardiac: Regular rate and rhythm. Normal S1/S2.  No murmurs, rubs, nor gallops.   Abdomen: Obese soft nontender Extremities: No peripheral edema.  Strong peripheral pulses.  MSK: Resisted right hip flexion reproduces pain 100%. Full range of motion and strength in the right hip. Right hip has negative log roll, negative straight leg raise, negative FABER/FADIR, no pain over greater trochanter. Mental Status: No depression, anxiety, nor agitation. Skin: Warm and dry.  Assessment & Plan: Barbara Mcmahon was seen today for discuss changing bcp, trying to lose wt and hip pain.  Diagnoses and associated orders for this visit:  Unspecified contraceptive management - norethindrone (CAMILA) 0.35 MG tablet; Take 1 tablet (0.35 mg total) by  mouth daily.  Strain of hip flexor, right, initial encounter - meloxicam (MOBIC) 15 MG tablet; Take 1 tablet (15 mg total) by mouth daily. For hip pain.  Obesity - phentermine 37.5 MG capsule; Take 1 capsule (37.5 mg total) by mouth every morning. - TSH  Other Orders - Discontinue: CAMILA 0.35 MG tablet; Take 0.35 tablets by  mouth daily. Take at same time every day to prevent pregnancy.    contraceptive management: Discussed oral therapy, intrauterine, implantation, transdermal options for contraception. Patient would prefer condoms until husbands vasectomy and would prefer to continue on progesterone pill, stressed importance of taking this at the exact same minute everyday to maximize effectiveness. Obesity: Uncontrolled, will rule out thyroid abnormality, patient is quite interested in phentermine in supplementation of diet and exercise interventions are in place Hip flexor strain on the right: Start Mobic as needed and provided with home exercise plan to begin a daily basis, return for referral the sports medicine Dr. Karie Mcmahon. in 3 weeks if not improving 25 minutes spent face-to-face during visit today of which at least 50% was counseling or coordinating care regarding contraceptive counseling, obesity, right hip flexor strain.    Return in about 4 weeks (around 02/10/2013).

## 2013-01-14 LAB — TSH: TSH: 1.351 u[IU]/mL (ref 0.350–4.500)

## 2013-01-19 ENCOUNTER — Telehealth: Payer: Self-pay | Admitting: *Deleted

## 2013-01-19 NOTE — Telephone Encounter (Signed)
Pt called and states she thinks she may have left her rx's at the lab when she went to have her blood drawn. She states she needs all but BCP called in. Called and confirmed at pharm that they didn't receive rx;s. Called in phentermine and meloxicam.

## 2013-01-26 ENCOUNTER — Telehealth: Payer: Self-pay | Admitting: Family Medicine

## 2013-01-26 NOTE — Telephone Encounter (Signed)
Note faxed.

## 2013-01-26 NOTE — Telephone Encounter (Signed)
Sue Lush, Can you please fax off the progress note I've attached to the [employer] phentermine document, placed in you inbox.

## 2013-01-27 ENCOUNTER — Telehealth: Payer: Self-pay | Admitting: *Deleted

## 2013-01-27 DIAGNOSIS — E669 Obesity, unspecified: Secondary | ICD-10-CM

## 2013-01-27 MED ORDER — TOPIRAMATE 50 MG PO TABS: ORAL_TABLET | ORAL | Status: DC

## 2013-01-27 NOTE — Telephone Encounter (Signed)
Sue Lush, Will you please let mrs. Brege know that an RX was sent to [facility] on [address].  Looks like she has f/u in about a month which will be good timing.

## 2013-01-27 NOTE — Telephone Encounter (Signed)
The only other diet medication I have experience with that does not contain phentermine would be Topamax, this is the medication that we talked about originally being created for seizures but is now commonly used due to it's appetitie suppression side effect.  Would she be interested in this?

## 2013-01-27 NOTE — Telephone Encounter (Signed)
Patient states she can not take the phentermine it caused insomnia and high anxiety so she has stopped it and wanted to know if there was anything else that was similar that she could try?

## 2013-01-27 NOTE — Telephone Encounter (Signed)
Pt.notified

## 2013-01-27 NOTE — Telephone Encounter (Signed)
Pt notified and would like to try Topamax

## 2013-02-16 ENCOUNTER — Encounter: Payer: Self-pay | Admitting: Family Medicine

## 2013-02-16 ENCOUNTER — Ambulatory Visit (INDEPENDENT_AMBULATORY_CARE_PROVIDER_SITE_OTHER): Payer: BC Managed Care – PPO | Admitting: Family Medicine

## 2013-02-16 VITALS — BP 117/73 | HR 83 | Temp 98.3°F | Wt 270.0 lb

## 2013-02-16 DIAGNOSIS — B9689 Other specified bacterial agents as the cause of diseases classified elsewhere: Secondary | ICD-10-CM

## 2013-02-16 DIAGNOSIS — J329 Chronic sinusitis, unspecified: Secondary | ICD-10-CM

## 2013-02-16 DIAGNOSIS — A499 Bacterial infection, unspecified: Secondary | ICD-10-CM

## 2013-02-16 MED ORDER — AMOXICILLIN-POT CLAVULANATE 500-125 MG PO TABS: ORAL_TABLET | ORAL | Status: AC

## 2013-02-16 NOTE — Progress Notes (Signed)
CC: Griselda Bramblett is a 37 y.o. female is here for right ear pain and Sinusitis   Subjective: HPI:  Patient complains of facial pressure localized in the for head and below the eyes and has been present for a week. Associated with worsening nasal drainage with thick discolored mucus. Symptoms are worse when flying an airplane. On a daily basis symptoms are moderate in severity. Slightly improved with decongestants, no other interventions as of yet. Denies fevers, chills, nausea, vomiting, motor or sensory disturbances, dysphagia, cough.    Review Of Systems Outlined In HPI  Past Medical History  Diagnosis Date  . PCOS (polycystic ovarian syndrome)   . IBS (irritable bowel syndrome)   . Abnormal Pap smear and cervical HPV (human papillomavirus)   . Generalized anxiety disorder   . Seizure disorder      Family History  Problem Relation Age of Onset  . Hypertension Father   . Diabetes Paternal Grandmother   . Hypertension Paternal Grandmother   . Stroke Paternal Grandmother   . Diabetes Paternal Grandfather   . Hypertension Paternal Grandfather   . Stroke Paternal Grandfather   . Hypertension Mother      History  Substance Use Topics  . Smoking status: Never Smoker   . Smokeless tobacco: Never Used  . Alcohol Use: No     Objective: Filed Vitals:   02/16/13 1507  BP: 117/73  Pulse: 83  Temp: 98.3 F (36.8 C)    General: Alert and Oriented, No Acute Distress HEENT: Pupils equal, round, reactive to light. Conjunctivae clear.  External ears unremarkable, canals clear with intact TMs with appropriate landmarks. Left Middle ear appears open without effusion, mild serous effusion on the right middle ear. Pink inferior turbinates.  Moist mucous membranes, pharynx without inflammation nor lesions.  Neck supple without palpable lymphadenopathy nor abnormal masses. Maxillary sinus tenderness to percussion Lungs: Clear to auscultation bilaterally, no wheezing/ronchi/rales.   Comfortable work of breathing. Good air movement. Cardiac: Regular rate and rhythm. Normal S1/S2.  No murmurs, rubs, nor gallops.   Extremities: No peripheral edema.  Strong peripheral pulses.  Mental Status: No depression, anxiety, nor agitation. Skin: Warm and dry.  Assessment & Plan: Sonyia was seen today for right ear pain and sinusitis.  Diagnoses and associated orders for this visit:  Bacterial sinusitis - amoxicillin-clavulanate (AUGMENTIN) 500-125 MG per tablet; Take one by mouth every 8 hours for ten total days.    Start Augmentin and may continue pseudoephedrine should consider nasal saline washes.  Return if symptoms worsen or fail to improve.

## 2013-02-18 NOTE — Telephone Encounter (Signed)
closed

## 2013-02-24 ENCOUNTER — Ambulatory Visit: Payer: BC Managed Care – PPO | Admitting: Family Medicine

## 2013-02-24 ENCOUNTER — Telehealth: Payer: Self-pay | Admitting: *Deleted

## 2013-02-24 MED ORDER — FLUCONAZOLE 150 MG PO TABS: 150.0000 mg | ORAL_TABLET | Freq: Once | ORAL | Status: DC

## 2013-02-24 MED ORDER — NYSTATIN 100000 UNIT/GM EX CREA: TOPICAL_CREAM | Freq: Two times a day (BID) | CUTANEOUS | Status: DC

## 2013-02-24 NOTE — Telephone Encounter (Signed)
Pt has been on abx rx'ed by Dr. Ivan Anchors. Pt left a message stating she has a vaginal  yeast infection and yeast on her buttocks and thigh as well.Pt would like diflucan and nystatin called into [facility].Pt notified that rx was sent

## 2013-03-06 ENCOUNTER — Encounter: Payer: Self-pay | Admitting: Family Medicine

## 2013-03-17 ENCOUNTER — Ambulatory Visit: Payer: BC Managed Care – PPO | Admitting: Family Medicine

## 2013-04-23 ENCOUNTER — Encounter: Payer: Self-pay | Admitting: Family Medicine

## 2013-04-23 DIAGNOSIS — Z8249 Family history of ischemic heart disease and other diseases of the circulatory system: Secondary | ICD-10-CM | POA: Insufficient documentation

## 2013-05-02 ENCOUNTER — Other Ambulatory Visit: Payer: Self-pay | Admitting: Family Medicine

## 2013-05-16 ENCOUNTER — Emergency Department (INDEPENDENT_AMBULATORY_CARE_PROVIDER_SITE_OTHER): Payer: BC Managed Care – PPO

## 2013-05-16 ENCOUNTER — Emergency Department
Admission: EM | Admit: 2013-05-16 | Discharge: 2013-05-16 | Disposition: A | Discharge status: Home / Self Care | Payer: BC Managed Care – PPO | Source: Home / Self Care | Attending: Family Medicine | Admitting: Family Medicine

## 2013-05-16 DIAGNOSIS — R079 Chest pain, unspecified: Secondary | ICD-10-CM

## 2013-05-16 DIAGNOSIS — S2341XA Sprain of ribs, initial encounter: Secondary | ICD-10-CM

## 2013-05-16 DIAGNOSIS — S29011A Strain of muscle and tendon of front wall of thorax, initial encounter: Secondary | ICD-10-CM

## 2013-05-16 DIAGNOSIS — J069 Acute upper respiratory infection, unspecified: Secondary | ICD-10-CM

## 2013-05-16 MED ORDER — HYDROCODONE-ACETAMINOPHEN 5-325 MG PO TABS: ORAL_TABLET | ORAL | Status: DC

## 2013-05-16 NOTE — ED Notes (Signed)
States she think she pulled a muscle on her left side when lifting her baby. Also noted with ear and sinus pressure, cough.  Husband recently dx with URI

## 2013-05-16 NOTE — ED Provider Notes (Signed)
CSN: [id number]     Arrival date & time 05/16/13  1636 History     First MD Initiated Contact with Patient 05/16/13 1649     Chief Complaint  Patient presents with  . Muscle Pain  . URI      HPI Comments: For about 3 days, patient has had cold-like symptoms with sore throat, nasal congestion, and cough.  No fevers, chills, and sweats.  Her husband has a similar illness. Two days ago she twisted to the right while holding her 33 pound infant and felt a sudden painful popping sensation in her left chest.  The pain has persisted and is worse with lifting, deep inspiration, and cough.  She has noted that the pain decreases when she lies on her left side.  The history is provided by the patient.    Past Medical History  Diagnosis Date  . PCOS (polycystic ovarian syndrome)   . IBS (irritable bowel syndrome)   . Abnormal Pap smear and cervical HPV (human papillomavirus)   . Generalized anxiety disorder   . Seizure disorder    Past Surgical History  Procedure Laterality Date  . Colposcopy    . Gynecologic cryosurgery    . Cholecystectomy    . Dilation and curettage of uterus    . Knee surgery  right   Family History  Problem Relation Age of Onset  . Hypertension Father   . Diabetes Paternal Grandmother   . Hypertension Paternal Grandmother   . Stroke Paternal Grandmother   . Diabetes Paternal Grandfather   . Hypertension Paternal Grandfather   . Stroke Paternal Grandfather   . Hypertension Mother    History  Substance Use Topics  . Smoking status: Never Smoker   . Smokeless tobacco: Never Used  . Alcohol Use: No   OB History   Grav Para Term Preterm Abortions TAB SAB Ect Mult Living   2 1 1  1  1   1      Review of Systems + sore throat + cough + ? pleuritic pain left No wheezing + nasal congestion + post-nasal drainage No sinus pain/pressure No itchy/red eyes No earache No hemoptysis No SOB No fever, + chills No nausea No vomiting No abdominal pain No  diarrhea No urinary symptoms No skin rashes + fatigue No myalgias + headache Used OTC meds without relief  Allergies  Codeine; Morphine and related; Robitussin a-c; and Shrimp  Home Medications   Current Outpatient Rx  Name  Route  Sig  Dispense  Refill  . Multiple Vitamin (MULTIVITAMIN) tablet   Oral   Take 1 tablet by mouth daily.         . cetirizine (ZYRTEC) 10 MG tablet   Oral   Take 10 mg by mouth daily.         . fluconazole (DIFLUCAN) 150 MG tablet   Oral   Take 1 tablet (150 mg total) by mouth once.   1 tablet   1   . HYDROcodone-acetaminophen (NORCO/VICODIN) 5-325 MG per tablet      Take one by mouth at bedtime as needed for pain   10 tablet   0   . meloxicam (MOBIC) 15 MG tablet   Oral   Take 1 tablet (15 mg total) by mouth daily. For hip pain.   30 tablet   0   . norethindrone (CAMILA) 0.35 MG tablet   Oral   Take 1 tablet (0.35 mg total) by mouth daily.   1 Package  11   . nystatin cream (MYCOSTATIN)   Topical   Apply topically 2 (two) times daily.   30 g   1   . Prenatal Vit-Fe Fumarate-FA (MULTIVITAMIN-PRENATAL) 27-0.8 MG TABS   Oral   Take 1 tablet by mouth daily.         . sertraline (ZOLOFT) 50 MG tablet      1/2 TAB DAILY FOR ONE WEEK, THEN WHOLE TAB   30 tablet   3    BP 103/65  Pulse 80  Temp(Src) 98 F (36.7 C) (Oral)  Ht 5' 5.5" (1.664 m)  Wt 274 lb (124.286 kg)  BMI 44.89 kg/m2  SpO2 98%  LMP 05/16/2013 Physical Exam  Nursing note and vitals reviewed. Constitutional: She is oriented to person, place, and time. She appears well-developed and well-nourished. No distress.  Patient is obese (BMI 44.9)  HENT:  Head: Normocephalic.  Mouth/Throat: Oropharynx is clear and moist.  Eyes: Conjunctivae are normal. Pupils are equal, round, and reactive to light.  Neck: Neck supple.  There are shotty tender right posterior cervical nodes  Cardiovascular: Normal heart sounds.   Pulmonary/Chest: Breath sounds normal.     There is tenderness left anterior/inferior chest as noted  Abdominal: Bowel sounds are normal. There is no tenderness.  Musculoskeletal: She exhibits no edema.  Neurological: She is alert and oriented to person, place, and time.  Skin: Skin is warm and dry. She is not diaphoretic. No erythema.    ED Course   Procedures  none   Dg Ribs Unilateral W/chest Left  05/16/2013   *RADIOLOGY REPORT*  Clinical Data: Chest and left rib pain.  LEFT RIBS AND CHEST - 3+ VIEW  Comparison: None  Findings: The cardiomediastinal silhouette is unremarkable. The lungs are clear. There is no evidence of focal airspace disease, pulmonary edema, suspicious pulmonary nodule/mass, pleural effusion, or pneumothorax. No acute bony abnormalities are identified. There is no evidence of left rib abnormality.  IMPRESSION: No evidence of active cardiopulmonary disease or left rib abnormality.   Original Report Authenticated By: Harmon Pier, M.D.   1. Intercostal muscle strain, initial encounter   2. Acute upper respiratory infections of unspecified site     MDM   Rib belt applied. Wear rib belt as needed except at night.  Apply ice pack 3 to 4 times daily until pain decreases. Take plain Mucinex (guaifenesin) twice daily for cough and congestion.  Increase fluid intake, rest. Stop all antihistamines for now, and other non-prescription cough/cold preparations. May take Ibuprofen 200mg , 4 tabs every 8 hours with food for rib pain.  Follow-up with family doctor if cough worsens, shortness of breath develops, or persistent fever develops  Lattie Haw, MD 05/18/13 1556

## 2013-05-19 ENCOUNTER — Ambulatory Visit (INDEPENDENT_AMBULATORY_CARE_PROVIDER_SITE_OTHER): Payer: BC Managed Care – PPO | Admitting: Family Medicine

## 2013-05-19 ENCOUNTER — Ambulatory Visit (INDEPENDENT_AMBULATORY_CARE_PROVIDER_SITE_OTHER): Payer: BC Managed Care – PPO

## 2013-05-19 ENCOUNTER — Encounter: Payer: Self-pay | Admitting: Family Medicine

## 2013-05-19 VITALS — BP 127/72 | HR 66 | Temp 98.2°F | Wt 275.0 lb

## 2013-05-19 DIAGNOSIS — R079 Chest pain, unspecified: Secondary | ICD-10-CM

## 2013-05-19 DIAGNOSIS — R0781 Pleurodynia: Secondary | ICD-10-CM

## 2013-05-19 MED ORDER — CYCLOBENZAPRINE HCL 10 MG PO TABS: ORAL_TABLET | ORAL | Status: DC

## 2013-05-19 NOTE — Progress Notes (Signed)
CC: Barbara Mcmahon is a 37 y.o. female is here for URI and Muscle Pain   Subjective: HPI:  Patient complains of left anterior chest wall pain is described as sharp slightly radiating around left side it is worse with deep breaths sneezing or coughing. Is described as moderate at rest severe with the above. She has had x-rays on Saturday that were unremarkable. Pain came on suddenly while lifting her child up above her head. Since then pain has been persistent. Slightly improved with hydrocodone nothing else makes better or worse it is greatly interfering with her sleep. She's developed a mild cough that is nonproductive she denies fevers, chills, shortness of breath, wheezing, back pain, abdominal pain, nausea, vomiting, confusion   Review Of Systems Outlined In HPI  Past Medical History  Diagnosis Date  . PCOS (polycystic ovarian syndrome)   . IBS (irritable bowel syndrome)   . Abnormal Pap smear and cervical HPV (human papillomavirus)   . Generalized anxiety disorder   . Seizure disorder      Family History  Problem Relation Age of Onset  . Hypertension Father   . Diabetes Paternal Grandmother   . Hypertension Paternal Grandmother   . Stroke Paternal Grandmother   . Diabetes Paternal Grandfather   . Hypertension Paternal Grandfather   . Stroke Paternal Grandfather   . Hypertension Mother      History  Substance Use Topics  . Smoking status: Never Smoker   . Smokeless tobacco: Never Used  . Alcohol Use: No     Objective: Filed Vitals:   05/19/13 0927  BP: 127/72  Pulse: 66  Temp: 98.2 F (36.8 C)    General: Alert and Oriented, No Acute Distress HEENT: Pupils equal, round, reactive to light. Conjunctivae clear.  Moist membranes pharynx unremarkable Lungs: Clear to auscultation bilaterally, no wheezing/ronchi/rales.  Comfortable work of breathing. Good air movement. Cardiac: Regular rate and rhythm. Normal S1/S2.  No murmurs, rubs, nor gallops.   Abdomen: Obese  soft nontender MSK: Anterior left rib approximately T9 T8-T10 is tender to palpation lateral compression does not reproduce pain there is no crepitus overlying the site of pain nor is there skin changes overlying Extremities: No peripheral edema.  Strong peripheral pulses.  Mental Status: No depression, anxiety, nor agitation. Skin: Warm and dry.  Assessment & Plan: Barbara Mcmahon was seen today for uri and muscle pain.  Diagnoses and associated orders for this visit:  Rib pain on left side - cyclobenzaprine (FLEXERIL) 10 MG tablet; Take a half to a full tab every 8-12 hours only as needed for muscle spasm, may cause sedation. - Cancel: DG Ribs Unilateral Left; Future - DG Ribs Unilateral Left; Future  Other Orders - Ibuprofen (ADVIL PO); Take 800 mg by mouth.    Left rib pain: It has been approximately one week since the injury with her degree of pain remaining I would like to get a followup x-ray to rule out rib fracture is negative   would provide great deal of reassurance that this is a bruised rib and intercostal muscle strain. I would like her to start incentive spirometry we demonstrated how to do this today clinic doing 2-3 times an hour while awake. Continue ibuprofen add cyclobenzaprine call if no improvement with muscle relaxer by tomorrow  Return if symptoms worsen or fail to improve.

## 2013-05-31 ENCOUNTER — Other Ambulatory Visit: Payer: Self-pay | Admitting: Family Medicine

## 2013-07-21 ENCOUNTER — Other Ambulatory Visit: Payer: Self-pay | Admitting: Obstetrics and Gynecology

## 2013-08-27 ENCOUNTER — Encounter: Payer: Self-pay | Admitting: Sports Medicine

## 2013-08-27 ENCOUNTER — Ambulatory Visit (INDEPENDENT_AMBULATORY_CARE_PROVIDER_SITE_OTHER): Payer: BC Managed Care – PPO | Admitting: Sports Medicine

## 2013-08-27 VITALS — BP 117/64 | HR 82 | Wt 278.0 lb

## 2013-08-27 DIAGNOSIS — J0100 Acute maxillary sinusitis, unspecified: Secondary | ICD-10-CM

## 2013-08-27 MED ORDER — DIPHENOXYLATE-ATROPINE 2.5-0.025 MG PO TABS: ORAL_TABLET | ORAL | Status: DC

## 2013-08-27 MED ORDER — FLUTICASONE PROPIONATE 50 MCG/ACT NA SUSP: NASAL | Status: DC

## 2013-08-27 MED ORDER — AMOXICILLIN-POT CLAVULANATE 875-125 MG PO TABS: 1.0000 | ORAL_TABLET | Freq: Two times a day (BID) | ORAL | Status: DC

## 2013-08-27 NOTE — Progress Notes (Signed)
  Subjective:    CC: Sick  HPI: This is a very pleasant 37 year old female who comes in after a recent trip, she developed sinus pressure, headache, diarrhea. Symptoms are moderate, persistent localized over the maxillary sinuses bilaterally. She has had sinus infections in the past, does feel similar, Augmentin has worked well.  Past medical history, Surgical history, Family history not pertinant except as noted below, Social history, Allergies, and medications have been entered into the medical record, reviewed, and no changes needed.   Review of Systems: No fevers, chills, night sweats, weight loss, chest pain, or shortness of breath.   Objective:    General: Well Developed, well nourished, and in no acute distress.  Neuro: Alert and oriented x3, extra-ocular muscles intact, sensation grossly intact.  HEENT: Normocephalic, atraumatic, pupils equal round reactive to light, neck supple, no masses, no lymphadenopathy, thyroid nonpalpable. Oropharynx, nasopharynx, external ear canals are unremarkable, tender to palpation over the maxillary sinuses.  Skin: Warm and dry, no rashes. Cardiac: Regular rate and rhythm, no murmurs rubs or gallops, no lower extremity edema.  Respiratory: Clear to auscultation bilaterally. Not using accessory muscles, speaking in full sentences. Abdomen: Soft, nontender, nondistended, no palpable masses, no guarding, no rebound tenderness.  Impression and Recommendations:

## 2013-08-27 NOTE — Assessment & Plan Note (Signed)
Augmentin, Flonase. Lomotil as needed she did have some viral diarrhea. Return in 2 weeks as needed.

## 2013-08-29 ENCOUNTER — Other Ambulatory Visit: Payer: Self-pay | Admitting: Family Medicine

## 2013-09-25 ENCOUNTER — Ambulatory Visit (INDEPENDENT_AMBULATORY_CARE_PROVIDER_SITE_OTHER): Payer: BC Managed Care – PPO | Admitting: Family Medicine

## 2013-09-25 ENCOUNTER — Encounter: Payer: Self-pay | Admitting: Family Medicine

## 2013-09-25 VITALS — BP 141/84 | HR 80 | Temp 98.3°F | Wt 274.0 lb

## 2013-09-25 DIAGNOSIS — B9689 Other specified bacterial agents as the cause of diseases classified elsewhere: Secondary | ICD-10-CM

## 2013-09-25 DIAGNOSIS — J329 Chronic sinusitis, unspecified: Secondary | ICD-10-CM

## 2013-09-25 DIAGNOSIS — A499 Bacterial infection, unspecified: Secondary | ICD-10-CM

## 2013-09-25 MED ORDER — AMOXICILLIN-POT CLAVULANATE 500-125 MG PO TABS: ORAL_TABLET | ORAL | Status: AC

## 2013-09-25 MED ORDER — AMOXICILLIN-POT CLAVULANATE 500-125 MG PO TABS: ORAL_TABLET | ORAL | Status: DC

## 2013-09-25 NOTE — Progress Notes (Signed)
CC: Kisha Messman is a 37 y.o. female is here for chest congestion   Subjective: HPI:  Complains of one week of facial pressure localized between the eyes and on the forehead worse with leaning forward. Accompanied by subjective fevers and chills with moderate nasal discharge. Accompanied over the past 3 days with nonproductive cough. No interventions as of yet. Symptoms are present all hours of the day but worse first thing in the morning and in the evening.  Overall symptoms described as moderate in severity worsening on a daily basis. Denies night sweats, motor or sensory disturbances, wheezing, shortness of breath, chest pain, sore throat, nor body aches.   Review Of Systems Outlined In HPI  Past Medical History  Diagnosis Date  . PCOS (polycystic ovarian syndrome)   . IBS (irritable bowel syndrome)   . Abnormal Pap smear and cervical HPV (human papillomavirus)   . Generalized anxiety disorder   . Seizure disorder      Family History  Problem Relation Age of Onset  . Hypertension Father   . Diabetes Paternal Grandmother   . Hypertension Paternal Grandmother   . Stroke Paternal Grandmother   . Diabetes Paternal Grandfather   . Hypertension Paternal Grandfather   . Stroke Paternal Grandfather   . Hypertension Mother      History  Substance Use Topics  . Smoking status: Never Smoker   . Smokeless tobacco: Never Used  . Alcohol Use: No     Objective: Filed Vitals:   09/25/13 1514  BP: 141/84  Pulse: 80  Temp: 98.3 F (36.8 C)    General: Alert and Oriented, No Acute Distress HEENT: Pupils equal, round, reactive to light. Conjunctivae clear.  External ears unremarkable, canals clear with intact TMs with appropriate landmarks. Right middle ear with mild serous effusion left no ear open. Pink inferior turbinates.  Moist mucous membranes, pharynx without inflammation nor lesions.  Neck supple without palpable lymphadenopathy nor abnormal masses. Lungs: Clear to  auscultation bilaterally, no wheezing/ronchi/rales.  Comfortable work of breathing. Good air movement. Cardiac: Regular rate and rhythm. Normal S1/S2.  No murmurs, rubs, nor gallops.   Mental Status: No depression, anxiety, nor agitation. Skin: Warm and dry.  Assessment & Plan: Janit was seen today for chest congestion.  Diagnoses and associated orders for this visit:  Bacterial sinusitis - Discontinue: amoxicillin-clavulanate (AUGMENTIN) 500-125 MG per tablet; Take one by mouth every 8 hours for ten total days. - amoxicillin-clavulanate (AUGMENTIN) 500-125 MG per tablet; Take one by mouth every 8 hours for ten total days.    Bacterial sinusitis: Start Augmentin and consider Alka-Seltzer cold and sinus for symptom control.Signs and symptoms requring emergent/urgent reevaluation were discussed with the patient.  Return if symptoms worsen or fail to improve.

## 2013-10-21 ENCOUNTER — Encounter: Payer: Self-pay | Admitting: Family Medicine

## 2013-10-21 ENCOUNTER — Ambulatory Visit (INDEPENDENT_AMBULATORY_CARE_PROVIDER_SITE_OTHER): Payer: BC Managed Care – PPO | Admitting: Family Medicine

## 2013-10-21 VITALS — BP 144/83 | HR 82 | Temp 98.1°F | Wt 277.0 lb

## 2013-10-21 DIAGNOSIS — J329 Chronic sinusitis, unspecified: Secondary | ICD-10-CM

## 2013-10-21 DIAGNOSIS — A499 Bacterial infection, unspecified: Secondary | ICD-10-CM

## 2013-10-21 DIAGNOSIS — B9689 Other specified bacterial agents as the cause of diseases classified elsewhere: Secondary | ICD-10-CM

## 2013-10-21 MED ORDER — LEVOFLOXACIN 500 MG PO TABS: 500.0000 mg | ORAL_TABLET | Freq: Every day | ORAL | Status: DC

## 2013-10-21 NOTE — Progress Notes (Signed)
CC: Barbara Mcmahon is a 38 y.o. female is here for URI   Subjective: HPI:  Conditions of facial pressure in the forehead bilaterally but has been present for the past week and a half worsening on a daily basis nonradiating worse when leaning forward or standing up quickly. Accompanied by thick nasal discharge with subjective postnasal drip both of which moderate in severity persistent throughout the day. Nonproductive cough has been present for one week. Above symptoms have not improved despite starting 20 mg of prednisone on a daily basis, azithromycin, and Hycodan on Sunday afternoon. Denies fevers, chills, shortness of breath, wheezing, abdominal pain, confusion nor motor or sensory disturbances. Does endorse night sweats the past few nights   Review Of Systems Outlined In HPI  Past Medical History  Diagnosis Date  . PCOS (polycystic ovarian syndrome)   . IBS (irritable bowel syndrome)   . Abnormal Pap smear and cervical HPV (human papillomavirus)   . Generalized anxiety disorder   . Seizure disorder      Family History  Problem Relation Age of Onset  . Hypertension Father   . Diabetes Paternal Grandmother   . Hypertension Paternal Grandmother   . Stroke Paternal Grandmother   . Diabetes Paternal Grandfather   . Hypertension Paternal Grandfather   . Stroke Paternal Grandfather   . Hypertension Mother      History  Substance Use Topics  . Smoking status: Never Smoker   . Smokeless tobacco: Never Used  . Alcohol Use: No     Objective: Filed Vitals:   10/21/13 1447  BP: 144/83  Pulse: 82  Temp: 98.1 F (36.7 C)    General: Alert and Oriented, No Acute Distress HEENT: Pupils equal, round, reactive to light. Conjunctivae clear.  External ears unremarkable, canals clear with intact TMs with appropriate landmarks.  Middle ear appears open without effusion. Boggy erythematous inferior turbinates with moderate mucoid discharge.  Moist mucous membranes, pharynx without  inflammation nor lesion however moderate cobblestoning.  Neck supple without palpable lymphadenopathy nor abnormal masses. Lungs: Clear to auscultation bilaterally, no wheezing/ronchi/rales.  Comfortable work of breathing. Good air movement. Cardiac: Regular rate and rhythm. Normal S1/S2.  No murmurs, rubs, nor gallops.   Mental Status: No depression, anxiety, nor agitation. Skin: Warm and dry.  Assessment & Plan: Tashiya was seen today for uri.  Diagnoses and associated orders for this visit:  Bacterial sinusitis - levofloxacin (LEVAQUIN) 500 MG tablet; Take 1 tablet (500 mg total) by mouth daily.    Bacterial sinusitis: Start Levaquin stop azithromycin, she's having extreme hunger with prednisone use therefore encouraged her to stop it since I doubt 20 mg we'll make much benefit with her presenting symptoms.  Return if symptoms worsen or fail to improve.

## 2013-11-18 ENCOUNTER — Ambulatory Visit (INDEPENDENT_AMBULATORY_CARE_PROVIDER_SITE_OTHER): Payer: BC Managed Care – PPO | Admitting: Family Medicine

## 2013-11-18 ENCOUNTER — Encounter: Payer: Self-pay | Admitting: Family Medicine

## 2013-11-18 VITALS — BP 136/79 | HR 85 | Wt 279.0 lb

## 2013-11-18 DIAGNOSIS — Z91013 Allergy to seafood: Secondary | ICD-10-CM

## 2013-11-18 DIAGNOSIS — G5620 Lesion of ulnar nerve, unspecified upper limb: Secondary | ICD-10-CM

## 2013-11-18 DIAGNOSIS — IMO0002 Reserved for concepts with insufficient information to code with codable children: Secondary | ICD-10-CM

## 2013-11-18 DIAGNOSIS — Z23 Encounter for immunization: Secondary | ICD-10-CM

## 2013-11-18 DIAGNOSIS — Z299 Encounter for prophylactic measures, unspecified: Secondary | ICD-10-CM

## 2013-11-18 DIAGNOSIS — G5630 Lesion of radial nerve, unspecified upper limb: Secondary | ICD-10-CM

## 2013-11-18 MED ORDER — CIPROFLOXACIN HCL 500 MG PO TABS: ORAL_TABLET | ORAL | Status: DC

## 2013-11-18 MED ORDER — DOXYCYCLINE HYCLATE 100 MG PO TABS: ORAL_TABLET | ORAL | Status: DC

## 2013-11-18 MED ORDER — OSELTAMIVIR PHOSPHATE 75 MG PO CAPS: ORAL_CAPSULE | ORAL | Status: DC

## 2013-11-18 MED ORDER — EPINEPHRINE 0.3 MG/0.3ML IJ SOAJ: 0.3000 mg | Freq: Once | INTRAMUSCULAR | Status: DC | PRN

## 2013-11-18 MED ORDER — LORAZEPAM 0.5 MG PO TABS: ORAL_TABLET | ORAL | Status: DC

## 2013-11-18 NOTE — Progress Notes (Addendum)
CC: Barbara Mcmahon is a 38 y.o. female is here for traveling to [place]   Subjective: HPI:  Patient will be traveling to [place] in April and needs to go over immunizations  She is extremely confident that she had a full hepatitis B vaccination when child, she believes she is also up-to-date with MMR vaccines, she had chickenpox as a child, she had the flu shot this year. She had a tetanus booster with pertussis booster well within the last 10 years. She will be traveling to [place] where malaria is present she will be in this location for 2-3 days but will not spend any time in a row location other than those days.  She has allergy to shellfish and does not have an EpiPen  She has a fear of flying which she is afraid will interfere with her ability to attend this trip which is mandatory for her job  Right hand numbness localized to the palm of the fifth and fourth digit. It is present when she wakes up in the morning she slept with her child while cradling her in the right arm it will slowly improve throughout the day but returns the next morning. Nothing else makes better or worse. She denies weakness or any other motor sensory disturbances in the right upper extremity. Denies neck pain  Review Of Systems Outlined In HPI  Past Medical History  Diagnosis Date  . PCOS (polycystic ovarian syndrome)   . IBS (irritable bowel syndrome)   . Abnormal Pap smear and cervical HPV (human papillomavirus)   . Generalized anxiety disorder   . Seizure disorder     Past Surgical History  Procedure Laterality Date  . Colposcopy    . Gynecologic cryosurgery    . Cholecystectomy    . Dilation and curettage of uterus    . Knee surgery  right   Family History  Problem Relation Age of Onset  . Hypertension Father   . Diabetes Paternal Grandmother   . Hypertension Paternal Grandmother   . Stroke Paternal Grandmother   . Diabetes Paternal Grandfather   . Hypertension Paternal Grandfather   .  Stroke Paternal Grandfather   . Hypertension Mother     History   Social History  . Marital Status: Married    Spouse Name: N/A    Number of Children: N/A  . Years of Education: N/A   Occupational History  . Not on file.   Social History Main Topics  . Smoking status: Never Smoker   . Smokeless tobacco: Never Used  . Alcohol Use: No  . Drug Use: No  . Sexual Activity: Not Currently   Other Topics Concern  . Not on file   Social History Narrative  . No narrative on file     Objective: BP 136/79  Pulse 85  Wt 279 lb (126.554 kg)  Vital signs reviewed. General: Alert and Oriented, No Acute Distress HEENT: Pupils equal, round, reactive to light. Conjunctivae clear.  External ears unremarkable.  Moist mucous membranes. Lungs: Clear and comfortable work of breathing, speaking in full sentences without accessory muscle use. Cardiac: Regular rate and rhythm.  Neuro: CN II-XII grossly intact, gait normal. Full grip strength in the right extremity, there is no muscle atrophy in the palm of the hand, vibratory sensation is intact in all 5 fingers, hot and cold d discrimination is present in all fingers light touch sensation is intact in all fingers. Interdigital muscle testing in all 5 fingers is normal Extremities: No peripheral edema.  Strong peripheral pulses.  Mental Status: No depression, anxiety, nor agitation. Logical though process. Skin: Warm and dry.  Assessment & Plan: Barbara Mcmahon was seen today for traveling to [place].  Diagnoses and associated orders for this visit:  Advice or immunization for travel - Measles/Mumps/Rubella Immunity - doxycycline (VIBRA-TABS) 100 MG tablet; Start on day prior to departure and take daily until four weeks after return for malaria prevention. - ciprofloxacin (CIPRO) 500 MG tablet; Take one tab twice a day for up to ten days as needed for UTI or Traveler's Diarrhea. - oseltamivir (TAMIFLU) 75 MG capsule; One by mouth twice a day for  five days if flu symptoms begin. - LORazepam (ATIVAN) 0.5 MG tablet; One by mouth twice a day as needed for travel anxiety. - EPINEPHrine (EPI-PEN) 0.3 mg/0.3 mL SOAJ injection; Inject 0.3 mLs (0.3 mg total) into the muscle once as needed (anaphylaxis). - Typhoid VICPS vaccine im - Poliovirus vaccine IPV subcutaneous/IM - Hepatitis A vaccine adult IM  Preventive measure - Typhoid VICPS vaccine im - Poliovirus vaccine IPV subcutaneous/IM - Hepatitis A vaccine adult IM  Shrimp allergy - EPINEPHrine (EPI-PEN) 0.3 mg/0.3 mL SOAJ injection; Inject 0.3 mLs (0.3 mg total) into the muscle once as needed (anaphylaxis).  Ulnar nerve palsy    For her trip to [place] we're checking her mmr titers, she will need a polio booster she received this today, she will receive the first of 2 hepatitis A immunizations and understands that this does not fully provider immunization at the time of her travel to offer some protection return in 6-12 months for full immunity, she will receive typhoid vaccine today. She will need malaria daily prophylaxis through doxycycline. Due to chances that she may develop traveler's diarrhea ciprofloxacin with instructions was provided. Ativan provided to help with flight anxiety. Tamiflu should she develop flulike symptoms. An EpiPen was given should she come in contact with shrimp products.  Discussed etiology of ulnar nerve palsy contributing to her numbness and that this should resolve on its own weeks to months after she stops cuddling/cradling her child in her right armSigns and symptoms requring emergent/urgent reevaluation were discussed with the patient.  40 minutes spent face-to-face during visit today of which at least 50% was counseling or coordinating care regarding: 1. Advice or immunization for travel   2. Preventive measure   3. Shrimp allergy   4. Ulnar nerve palsy      Return in about 6 months (around 05/18/2014).

## 2013-11-20 ENCOUNTER — Telehealth: Payer: Self-pay | Admitting: *Deleted

## 2013-11-20 LAB — MEASLES/MUMPS/RUBELLA IMMUNITY
Mumps IgG: 86.2 AU/mL — ABNORMAL HIGH (ref <9.00)
RUBEOLA IGG: 136 [AU]/ml — AB (ref <25.00)
Rubella: 5.06 Index — ABNORMAL HIGH (ref <0.90)

## 2013-11-20 NOTE — Telephone Encounter (Signed)
Error

## 2013-12-18 ENCOUNTER — Other Ambulatory Visit: Payer: Self-pay | Admitting: Family Medicine

## 2014-03-19 ENCOUNTER — Ambulatory Visit (INDEPENDENT_AMBULATORY_CARE_PROVIDER_SITE_OTHER): Payer: BC Managed Care – PPO | Admitting: Family Medicine

## 2014-03-19 ENCOUNTER — Encounter: Payer: Self-pay | Admitting: Family Medicine

## 2014-03-19 VITALS — BP 137/90 | HR 86 | Wt 275.0 lb

## 2014-03-19 DIAGNOSIS — F411 Generalized anxiety disorder: Secondary | ICD-10-CM

## 2014-03-19 DIAGNOSIS — B353 Tinea pedis: Secondary | ICD-10-CM

## 2014-03-19 MED ORDER — CLOTRIMAZOLE 1 % EX CREA: TOPICAL_CREAM | CUTANEOUS | Status: AC

## 2014-03-19 MED ORDER — VENLAFAXINE HCL ER 37.5 MG PO CP24: ORAL_CAPSULE | ORAL | Status: DC

## 2014-03-19 NOTE — Progress Notes (Signed)
CC: Barbara Mcmahon is a 38 y.o. female is here for f/u meds   Subjective: HPI:   complains of burning and itching localized between the fifth and fourth toe on the left foot that has been present for the past one to 2 weeks. Symptoms are moderate in severity present all hours of the day. Nothing particularly makes better or worse. She's never had this before. No interventions as of yet. She denies fevers, chills, swelling, edema, swollen lymph nodes nor skin changes elsewhere  She complains of worsening anxiety over the past month has been accompanied by her sons worsening behavior problems. She feels that she is lashing out verbally to strangers who are annoying her and feels more stressed out at home. Symptoms are not worsened by anything else other than above, nothing particularly makes it better but she continues to take Zoloft. Symptoms are present on a daily basis and can fluctuate throughout the day. She denies depression, paranoia, hallucinations nor mental disturbance other than irritability and anxiety   Review Of Systems Outlined In HPI  Past Medical History  Diagnosis Date  . PCOS (polycystic ovarian syndrome)   . IBS (irritable bowel syndrome)   . Abnormal Pap smear and cervical HPV (human papillomavirus)   . Generalized anxiety disorder   . Seizure disorder     Past Surgical History  Procedure Laterality Date  . Colposcopy    . Gynecologic cryosurgery    . Cholecystectomy    . Dilation and curettage of uterus    . Knee surgery  right   Family History  Problem Relation Age of Onset  . Hypertension Father   . Diabetes Paternal Grandmother   . Hypertension Paternal Grandmother   . Stroke Paternal Grandmother   . Diabetes Paternal Grandfather   . Hypertension Paternal Grandfather   . Stroke Paternal Grandfather   . Hypertension Mother     History   Social History  . Marital Status: Married    Spouse Name: N/A    Number of Children: N/A  . Years of Education:  N/A   Occupational History  . Not on file.   Social History Main Topics  . Smoking status: Never Smoker   . Smokeless tobacco: Never Used  . Alcohol Use: No  . Drug Use: No  . Sexual Activity: Not Currently   Other Topics Concern  . Not on file   Social History Narrative  . No narrative on file     Objective: BP 137/90  Pulse 86  Wt 275 lb (124.739 kg)  General: Alert and Oriented, No Acute Distress HEENT: Pupils equal, round, reactive to light. Conjunctivae clear.  Moist mucous members pharynx unremarkable Lungs: Clear to auscultation bilaterally, no wheezing/ronchi/rales.  Comfortable work of breathing. Good air movement. Cardiac: Regular rate and rhythm. Normal S1/S2.  No murmurs, rubs, nor gallops.   Extremities: No peripheral edema.  Strong peripheral pulses.  Mental Status: No depression, anxiety, nor agitation. Skin: Warm and dry. There is moderate scaling and maceration between the left fourth and fifth toe  Assessment & Plan: Barbara Mcmahon was seen today for f/u meds.  Diagnoses and associated orders for this visit:  Tinea pedis - clotrimazole (LOTRIMIN) 1 % cream; Apply to affected areas twice a day for up to four weeks, applying up to two weeks after resolution of symptoms.  Generalized anxiety disorder - venlafaxine XR (EFFEXOR XR) 37.5 MG 24 hr capsule; One by mouth daily for five days then two capsules every morning thereafter.  Tinea pedis start clotrimazole Generalized anxiety disorder: Stop taking Zoloft today no need for taper since she is on such a low dose to begin with. Start taper up with Effexor, e-mail me in 2 weeks with an update of mood, followup in 4 weeks for face-to-face encounter   Return in about 4 weeks (around 04/16/2014), or if symptoms worsen or fail to improve.

## 2014-04-14 ENCOUNTER — Other Ambulatory Visit: Payer: Self-pay | Admitting: Family Medicine

## 2014-05-12 ENCOUNTER — Ambulatory Visit (INDEPENDENT_AMBULATORY_CARE_PROVIDER_SITE_OTHER): Payer: BC Managed Care – PPO | Admitting: Family Medicine

## 2014-05-12 ENCOUNTER — Encounter: Payer: Self-pay | Admitting: Family Medicine

## 2014-05-12 VITALS — BP 138/80 | HR 92 | Wt 278.0 lb

## 2014-05-12 DIAGNOSIS — F411 Generalized anxiety disorder: Secondary | ICD-10-CM

## 2014-05-12 DIAGNOSIS — B353 Tinea pedis: Secondary | ICD-10-CM

## 2014-05-12 DIAGNOSIS — F419 Anxiety disorder, unspecified: Secondary | ICD-10-CM

## 2014-05-12 MED ORDER — VENLAFAXINE HCL ER 75 MG PO CP24: 75.0000 mg | ORAL_CAPSULE | Freq: Every day | ORAL | Status: DC

## 2014-05-12 MED ORDER — TERBINAFINE HCL 250 MG PO TABS: 250.0000 mg | ORAL_TABLET | Freq: Every day | ORAL | Status: DC

## 2014-05-12 MED ORDER — CLOTRIMAZOLE 1 % EX CREA: TOPICAL_CREAM | CUTANEOUS | Status: AC

## 2014-05-12 NOTE — Progress Notes (Signed)
CC: Barbara Mcmahon is a 38 y.o. female is here for f/u fungus on feet   Subjective: HPI:  Patient states that burning and itching of the last lateral web space in both feet improved greatly with clotrimazole however within days after stopping it it seems to return gradually. She reports some scaling but no other skin changes. She denies skin changes elsewhere in her body. She's been using clotrimazole for 2 weeks after symptoms resolved only to have them return as listed above. Denies fevers, chills, swollen lymph nodes nor joint pain.  Followup anxiety: Patient states that she's noticed a drastic improvement with her ability to not let stressful situations with her son interfere with her quality of life ever since starting on Effexor. She denies any known intolerance to the medication.   Review Of Systems Outlined In HPI  Past Medical History  Diagnosis Date  . PCOS (polycystic ovarian syndrome)   . IBS (irritable bowel syndrome)   . Abnormal Pap smear and cervical HPV (human papillomavirus)   . Generalized anxiety disorder   . Seizure disorder     Past Surgical History  Procedure Laterality Date  . Colposcopy    . Gynecologic cryosurgery    . Cholecystectomy    . Dilation and curettage of uterus    . Knee surgery  right   Family History  Problem Relation Age of Onset  . Hypertension Father   . Diabetes Paternal Grandmother   . Hypertension Paternal Grandmother   . Stroke Paternal Grandmother   . Diabetes Paternal Grandfather   . Hypertension Paternal Grandfather   . Stroke Paternal Grandfather   . Hypertension Mother     History   Social History  . Marital Status: Married    Spouse Name: N/A    Number of Children: N/A  . Years of Education: N/A   Occupational History  . Not on file.   Social History Main Topics  . Smoking status: Never Smoker   . Smokeless tobacco: Never Used  . Alcohol Use: No  . Drug Use: No  . Sexual Activity: Not Currently   Other  Topics Concern  . Not on file   Social History Narrative  . No narrative on file     Objective: BP 138/80  Pulse 92  Wt 278 lb (126.1 kg)  Vital signs reviewed. General: Alert and Oriented, No Acute Distress HEENT: Pupils equal, round, reactive to light. Conjunctivae clear.  External ears unremarkable.  Moist mucous membranes. Lungs: Clear and comfortable work of breathing, speaking in full sentences without accessory muscle use. Cardiac: Regular rate and rhythm.  Neuro: CN II-XII grossly intact, gait normal. Extremities: No peripheral edema.  Strong peripheral pulses.  Mental Status: No depression, anxiety, nor agitation. Logical though process. Skin: Warm and dry. Mild erythema and scaling in the last lateral web spaces both feet no other skin abnormalities elsewhere on the feet  Assessment & Plan: Carmeline was seen today for f/u fungus on feet.  Diagnoses and associated orders for this visit:  Tinea pedis of both feet - terbinafine (LAMISIL) 250 MG tablet; Take 1 tablet (250 mg total) by mouth daily.  Anxiety - venlafaxine XR (EFFEXOR XR) 75 MG 24 hr capsule; Take 1 capsule (75 mg total) by mouth daily.  Generalized anxiety disorder    Tinea pedis: Start Lamisil for 2 weeks, she can consider using clobetasol the same time for maximum effect. It sounds like she might be reintroducing herself to tinea pedis by going to the community  pool most days of the week, avoid being barefoot there. Anxiety: Improving control on Effexor continue current dose  Return if symptoms worsen or fail to improve.

## 2014-05-15 ENCOUNTER — Encounter: Payer: Self-pay | Admitting: Emergency Medicine

## 2014-05-15 ENCOUNTER — Emergency Department
Admission: EM | Admit: 2014-05-15 | Discharge: 2014-05-15 | Disposition: A | Discharge status: Home / Self Care | Payer: BC Managed Care – PPO | Source: Home / Self Care | Attending: Emergency Medicine | Admitting: Emergency Medicine

## 2014-05-15 DIAGNOSIS — R635 Abnormal weight gain: Secondary | ICD-10-CM

## 2014-05-15 DIAGNOSIS — R1033 Periumbilical pain: Secondary | ICD-10-CM

## 2014-05-15 DIAGNOSIS — K589 Irritable bowel syndrome without diarrhea: Secondary | ICD-10-CM

## 2014-05-15 DIAGNOSIS — R1032 Left lower quadrant pain: Secondary | ICD-10-CM

## 2014-05-15 LAB — CBC WITH DIFFERENTIAL/PLATELET
Basophils Absolute: 0 10*3/uL (ref 0.0–0.1)
Basophils Relative: 0 % (ref 0–1)
Eosinophils Absolute: 0.1 10*3/uL (ref 0.0–0.7)
Eosinophils Relative: 1 % (ref 0–5)
HCT: 37.8 % (ref 36.0–46.0)
Hemoglobin: 12.7 g/dL (ref 12.0–15.0)
Lymphocytes Relative: 37 % (ref 12–46)
Lymphs Abs: 2.6 10*3/uL (ref 0.7–4.0)
MCH: 28.9 pg (ref 26.0–34.0)
MCHC: 33.6 g/dL (ref 30.0–36.0)
MCV: 86.1 fL (ref 78.0–100.0)
Monocytes Absolute: 0.4 10*3/uL (ref 0.1–1.0)
Monocytes Relative: 6 % (ref 3–12)
Neutro Abs: 4 10*3/uL (ref 1.7–7.7)
Neutrophils Relative %: 56 % (ref 43–77)
Platelets: 304 10*3/uL (ref 150–400)
RBC: 4.39 MIL/uL (ref 3.87–5.11)
RDW: 13.7 % (ref 11.5–15.5)
WBC: 7.1 10*3/uL (ref 4.0–10.5)

## 2014-05-15 LAB — POCT URINALYSIS DIP (MANUAL ENTRY)
Bilirubin, UA: NEGATIVE
Glucose, UA: NEGATIVE
Ketones, POC UA: NEGATIVE
Leukocytes, UA: NEGATIVE
Nitrite, UA: NEGATIVE
Protein Ur, POC: NEGATIVE
Spec Grav, UA: 1.02
Urobilinogen, UA: 0.2
pH, UA: 6

## 2014-05-15 LAB — COMPREHENSIVE METABOLIC PANEL
ALT: 16 U/L (ref 0–35)
AST: 17 U/L (ref 0–37)
Albumin: 3.8 g/dL (ref 3.5–5.2)
Alkaline Phosphatase: 64 U/L (ref 39–117)
BUN: 10 mg/dL (ref 6–23)
CO2: 24 mEq/L (ref 19–32)
Calcium: 8.9 mg/dL (ref 8.4–10.5)
Chloride: 107 mEq/L (ref 96–112)
Creat: 0.73 mg/dL (ref 0.50–1.10)
Glucose, Bld: 123 mg/dL — ABNORMAL HIGH (ref 70–99)
Potassium: 4.1 mEq/L (ref 3.5–5.3)
Sodium: 140 mEq/L (ref 135–145)
Total Bilirubin: 0.3 mg/dL (ref 0.2–1.2)
Total Protein: 6.3 g/dL (ref 6.0–8.3)

## 2014-05-15 LAB — AMYLASE: Amylase: 14 U/L (ref 0–105)

## 2014-05-15 LAB — HCG, SERUM, QUALITATIVE: Preg, Serum: NEGATIVE

## 2014-05-15 LAB — LIPASE: Lipase: 19 U/L (ref 0–75)

## 2014-05-15 LAB — POCT URINE PREGNANCY: Preg Test, Ur: NEGATIVE

## 2014-05-15 MED ORDER — KETOROLAC TROMETHAMINE 60 MG/2ML IM SOLN
60.0000 mg | Freq: Once | INTRAMUSCULAR | Status: AC
  Administered 2014-05-15: 60 mg via INTRAMUSCULAR

## 2014-05-15 NOTE — ED Notes (Signed)
Pt has had weight gain of 10 pounds in less than 2 weeks and is on a weight loss program.  She has been extremely fatigued with swollen breasts, diarrhea, and L side pain since Thursday.  L side pain goes up to L shoulder.  Pain 7/10 Pt concerned about being pregnant but is on birth control pills.  Neg test Thursday but still a concern to her.

## 2014-05-15 NOTE — ED Provider Notes (Addendum)
CSN: [id number]     Arrival date & time 05/15/14  1048 History   First MD Initiated Contact with Patient 05/15/14 1104     Chief Complaint  Patient presents with  . Flank Pain  . Weight Gain  . Fatigue   Patient presents to [facility] urgent care on Saturday 05/15/2014 HPI Pt c/o weight gain of 10 pounds in less than 2 weeks and is on a weight loss program. She has been extremely fatigued with swollen breasts, diarrhea (2 loose watery stools in 24 hrs, without blood or mucus) and L side pain since X 2 days.  L side and left lower quadrant crampy pain, occasionally going up to L shoulder. Pain 3/10.   Pt concerned about being pregnant but is on birth control pills. Neg home pregnancy test 2 days ago, but still a concern to her. She requests various blood tests to definitively rule out pregnancy. Had vaginal spotting 2 days ago which resolved. Denies dysuria, hematuria, urinary urgency.  No nausea or vomiting. No chest pain or cough or shortness of breath or URI symptoms. Denies fever or chills.  Reviewed past medical history and past surgical history below. Had normal TSH earlier this year. Past Medical History  Diagnosis Date  . PCOS (polycystic ovarian syndrome)   . IBS (irritable bowel syndrome)   . Abnormal Pap smear and cervical HPV (human papillomavirus)   . Generalized anxiety disorder   . Seizure disorder    Past Surgical History  Procedure Laterality Date  . Colposcopy    . Gynecologic cryosurgery    . Cholecystectomy    . Dilation and curettage of uterus    . Knee surgery  right   Family History  Problem Relation Age of Onset  . Hypertension Father   . Diabetes Paternal Grandmother   . Hypertension Paternal Grandmother   . Stroke Paternal Grandmother   . Diabetes Paternal Grandfather   . Hypertension Paternal Grandfather   . Stroke Paternal Grandfather   . Hypertension Mother    History  Substance Use Topics  . Smoking status: Never Smoker   . Smokeless  tobacco: Never Used  . Alcohol Use: No   OB History   Grav Para Term Preterm Abortions TAB SAB Ect Mult Living   2 1 1  1  1   1      Review of Systems  All other systems reviewed and are negative.   Allergies  Codeine; Morphine and related; Robitussin a-c; and Shrimp  Home Medications   Prior to Admission medications   Medication Sig Start Date End Date Taking? Authorizing Provider  cetirizine (ZYRTEC) 10 MG tablet Take 10 mg by mouth daily.    Historical Provider, MD  clotrimazole (LOTRIMIN) 1 % cream Apply to affected areas twice a day for up to four weeks, applying up to two weeks after resolution of symptoms. 05/12/14 06/11/14  Marcial Pacas, DO  desogestrel-ethinyl estradiol (KARIVA,AZURETTE,MIRCETTE) 0.15-0.02/0.01 MG (21/5) tablet Take 1 tablet by mouth daily.    Historical Provider, MD  EPINEPHrine (EPI-PEN) 0.3 mg/0.3 mL SOAJ injection Inject 0.3 mLs (0.3 mg total) into the muscle once as needed (anaphylaxis). 11/18/13   Sean Hommel, DO  fluticasone (FLONASE) 50 MCG/ACT nasal spray One spray in each nostril twice a day, use left hand for right nostril, and right hand for left nostril. 08/27/13   Silverio Decamp, MD  Ibuprofen (ADVIL PO) Take 800 mg by mouth.    Historical Provider, MD  LORazepam (ATIVAN) 0.5 MG tablet One  by mouth twice a day as needed for travel anxiety. 11/18/13   Marcial Pacas, DO  Prenatal Vit-Fe Fumarate-FA (MULTIVITAMIN-PRENATAL) 27-0.8 MG TABS Take 1 tablet by mouth daily.    Historical Provider, MD  terbinafine (LAMISIL) 250 MG tablet Take 1 tablet (250 mg total) by mouth daily. 05/12/14   Sean Hommel, DO  venlafaxine XR (EFFEXOR XR) 75 MG 24 hr capsule Take 1 capsule (75 mg total) by mouth daily. 05/12/14 05/12/15  Sean Hommel, DO   BP 132/84  Pulse 95  Temp(Src) 98.8 F (37.1 C) (Oral)  Ht 5\' 6"  (1.676 m)  Wt 274 lb 8 oz (124.512 kg)  BMI 44.33 kg/m2  SpO2 97% Physical Exam  Nursing note and vitals reviewed. Constitutional: She is oriented to person,  place, and time. She appears well-developed and well-nourished.  Non-toxic appearance. No distress.  Appears fatigued, but no acute distress. Pleasant, cooperative, obese female.  HENT:  Head: Normocephalic and atraumatic.  Nose: Nose normal.  Mouth/Throat: Oropharynx is clear and moist.  Oropharynx clear. Some moisture on mucous membranes. No lesions.  Eyes: Pupils are equal, round, and reactive to light. No scleral icterus.  Neck: Normal range of motion. Neck supple. No JVD present.  Cardiovascular: Normal rate, regular rhythm and normal heart sounds.  Exam reveals no gallop and no friction rub.   No murmur heard. Pulmonary/Chest: Effort normal and breath sounds normal.  Abdominal: Soft. She exhibits no distension, no abdominal bruit and no mass. Bowel sounds are increased. There is no hepatosplenomegaly. There is tenderness (Minimal diffuse tenderness). There is no rigidity, no rebound, no guarding, no CVA tenderness, no tenderness at McBurney's point and negative Murphy's sign.  Musculoskeletal: Normal range of motion. She exhibits no edema and no tenderness.       Left shoulder: Normal.  Lymphadenopathy:    She has no cervical adenopathy.  Neurological: She is alert and oriented to person, place, and time.  Skin: No rash noted. She is not diaphoretic.  Psychiatric: She has a normal mood and affect.    ED Course  Procedures (including critical care time) Labs Review Labs Reviewed  URINE CULTURE  CBC WITH DIFFERENTIAL  HCG, SERUM, QUALITATIVE  COMPREHENSIVE METABOLIC PANEL  AMYLASE  LIPASE  POCT URINE PREGNANCY  POCT URINALYSIS DIP (MANUAL ENTRY)   Results for orders placed during the hospital encounter of 05/15/14  POCT URINE PREGNANCY      Result Value Ref Range   Preg Test, Ur Negative    POCT URINALYSIS DIP (MANUAL ENTRY)      Result Value Ref Range   Color, UA yellow     Clarity, UA clear     Glucose, UA neg     Bilirubin, UA negative     Bilirubin, UA negative      Spec Grav, UA 1.020     Blood, UA moderate     pH, UA 6.0     Protein Ur, POC negative     Urobilinogen, UA 0.2     Nitrite, UA Negative     Leukocytes, UA Negative      Imaging Review No results found.   MDM   1. Abdominal pain, left lower quadrant   2. Abdominal pain, periumbilical   3. Weight gain, abnormal    Today, urine pregnancy test negative.  Urinalysis negative except moderate blood  Clinically, no evidence of acute abdomen or surgical abdomen. Symptoms could be related to PCOS, irritable bowel syndrome;  also in the differential could be ovarian cyst.  Less likely diverticulitis, as there is no fever or chills or specific point tenderness in the left lower quadrant. Urinalysis negative except moderate blood, also in the differential could be kidney stone, although she's not having typical renal colic symptoms, and intensity of pain is not severe. I doubt this is UTI, but we'll send off urine culture.  Over 25 minutes spent, greater than 50% of the time spent for counseling and coordination of care. Treatment options discussed, as well as risks, benefits, alternatives. Patient voiced understanding and agreement with the following plans: Send off the following blood tests to reference lab (and she can followup with her PCP or Gyn in 2-3 days) Serum pregnancy test TSH CMP CBC with differential Amylase, lipase Urine culture  She declined any prescription pain medication.  Precautions discussed. Red flags discussed.--To ER if any red flags Questions invited and answered. Patient voiced understanding and agreement.       Jacqulyn Cane, MD 05/15/14 [place], MD 05/15/14 [phone]

## 2014-05-17 ENCOUNTER — Telehealth: Payer: Self-pay | Admitting: Emergency Medicine

## 2014-05-18 LAB — URINE CULTURE: Colony Count: 30000

## 2014-05-24 ENCOUNTER — Ambulatory Visit (INDEPENDENT_AMBULATORY_CARE_PROVIDER_SITE_OTHER): Payer: BC Managed Care – PPO

## 2014-05-24 ENCOUNTER — Encounter: Payer: Self-pay | Admitting: Family Medicine

## 2014-05-24 ENCOUNTER — Ambulatory Visit (INDEPENDENT_AMBULATORY_CARE_PROVIDER_SITE_OTHER): Payer: BC Managed Care – PPO | Admitting: Family Medicine

## 2014-05-24 VITALS — BP 127/73 | HR 98 | Wt 273.0 lb

## 2014-05-24 DIAGNOSIS — IMO0002 Reserved for concepts with insufficient information to code with codable children: Secondary | ICD-10-CM

## 2014-05-24 DIAGNOSIS — M5416 Radiculopathy, lumbar region: Secondary | ICD-10-CM

## 2014-05-24 DIAGNOSIS — M549 Dorsalgia, unspecified: Secondary | ICD-10-CM

## 2014-05-24 MED ORDER — OXYCODONE-ACETAMINOPHEN 10-325 MG PO TABS: 1.0000 | ORAL_TABLET | Freq: Three times a day (TID) | ORAL | Status: DC | PRN

## 2014-05-24 MED ORDER — PREDNISONE 20 MG PO TABS: ORAL_TABLET | ORAL | Status: AC

## 2014-05-24 NOTE — Progress Notes (Signed)
CC: Barbara Mcmahon is a 38 y.o. female is here for Back Pain   Subjective: HPI:  Complains of right lower back pain that radiates down the back of the right leg ending around the ankle. Symptoms are worse when lying down or bending forward. Symptoms are persistent throughout the day and seemed to be worse at night interfering with sleep. No Benefit from a muscle relaxer nor hydrocodone, interventions have included a noncontrast CT at a local emergency room which was unremarkable. Denies motor or sensory disturbances in the right lower extremity. Denies midline back pain nor bowel or bladder incontinence. No recent or remote trauma. Denies fevers, chills, abdominal pain, flank pain, dysuria, constipation or diarrhea   Review Of Systems Outlined In HPI  Past Medical History  Diagnosis Date  . PCOS (polycystic ovarian syndrome)   . IBS (irritable bowel syndrome)   . Abnormal Pap smear and cervical HPV (human papillomavirus)   . Generalized anxiety disorder   . Seizure disorder     Past Surgical History  Procedure Laterality Date  . Colposcopy    . Gynecologic cryosurgery    . Cholecystectomy    . Dilation and curettage of uterus    . Knee surgery  right   Family History  Problem Relation Age of Onset  . Hypertension Father   . Diabetes Paternal Grandmother   . Hypertension Paternal Grandmother   . Stroke Paternal Grandmother   . Diabetes Paternal Grandfather   . Hypertension Paternal Grandfather   . Stroke Paternal Grandfather   . Hypertension Mother     History   Social History  . Marital Status: Married    Spouse Name: N/A    Number of Children: N/A  . Years of Education: N/A   Occupational History  . Not on file.   Social History Main Topics  . Smoking status: Never Smoker   . Smokeless tobacco: Never Used  . Alcohol Use: No  . Drug Use: No  . Sexual Activity: Not Currently   Other Topics Concern  . Not on file   Social History Narrative  . No narrative  on file     Objective: BP 127/73  Pulse 98  Wt 273 lb (123.832 kg)  General: Alert and Oriented, No Acute Distress HEENT: Pupils equal, round, reactive to light. Conjunctivae clear.  Moist mucous membranes pharynx unremarkable Lungs: Clear to auscultation bilaterally, no wheezing/ronchi/rales.  Comfortable work of breathing. Good air movement. Cardiac: Regular rate and rhythm. Normal S1/S2.  No murmurs, rubs, nor gallops.   Extremities: No peripheral edema.  Strong peripheral pulses. Mild reproduction of pain with palpation of L2 spinous process. 100% reproduction of pain with right straight leg raise. L4 and S1 DTRs two over four bilaterally and symmetric full range of motion strength in the right and left lower extremities Mental Status: No depression, anxiety, nor agitation. Skin: Warm and dry.  Assessment & Plan: Adelayde was seen today for back pain.  Diagnoses and associated orders for this visit:  Right lumbar radiculitis - predniSONE (DELTASONE) 20 MG tablet; Three tabs daily days 1-3, two tabs daily days 4-6, one tab daily days 7-9, half tab daily days 10-13. - oxyCODONE-acetaminophen (PERCOCET) 10-325 MG per tablet; Take 1 tablet by mouth every 8 (eight) hours as needed for pain. - DG Lumbar Spine Complete; Future    Right lumbar radiculitis: Suspect this is most likely sciatica therefore start prednisone, as he Percocet to help with sleep, home exercises to be performed on a daily basis (  handout given). I would like to obtain an x-ray to rule out any spurring that could be causing nerve impingement.   Return if symptoms worsen or fail to improve.

## 2014-05-25 ENCOUNTER — Telehealth: Payer: Self-pay | Admitting: *Deleted

## 2014-05-25 DIAGNOSIS — M5416 Radiculopathy, lumbar region: Secondary | ICD-10-CM

## 2014-05-25 MED ORDER — TRAMADOL HCL 50 MG PO TABS: 50.0000 mg | ORAL_TABLET | Freq: Three times a day (TID) | ORAL | Status: DC | PRN

## 2014-05-25 NOTE — Telephone Encounter (Signed)
Seth Bake, Yes, rx for tramadol placed in your inbox.

## 2014-05-25 NOTE — Telephone Encounter (Signed)
Pt states the percocet made her very itchy. She wants to know if there is anything else that can be rx'ed

## 2014-05-25 NOTE — Telephone Encounter (Signed)
Pt notified and rx faxed

## 2014-08-09 ENCOUNTER — Encounter: Payer: Self-pay | Admitting: Family Medicine

## 2014-08-27 ENCOUNTER — Other Ambulatory Visit: Payer: Self-pay | Admitting: Obstetrics and Gynecology

## 2014-08-27 DIAGNOSIS — R87610 Atypical squamous cells of undetermined significance on cytologic smear of cervix (ASC-US): Secondary | ICD-10-CM | POA: Insufficient documentation

## 2014-08-30 LAB — CYTOLOGY - PAP

## 2014-09-05 ENCOUNTER — Encounter: Payer: Self-pay | Admitting: *Deleted

## 2014-09-05 ENCOUNTER — Emergency Department
Admission: EM | Admit: 2014-09-05 | Discharge: 2014-09-05 | Disposition: A | Discharge status: Home / Self Care | Payer: BC Managed Care – PPO | Source: Home / Self Care | Attending: Family Medicine | Admitting: Family Medicine

## 2014-09-05 DIAGNOSIS — L739 Follicular disorder, unspecified: Secondary | ICD-10-CM

## 2014-09-05 DIAGNOSIS — J0100 Acute maxillary sinusitis, unspecified: Secondary | ICD-10-CM

## 2014-09-05 MED ORDER — PREDNISONE 10 MG PO TABS: 30.0000 mg | ORAL_TABLET | Freq: Every day | ORAL | Status: DC

## 2014-09-05 MED ORDER — DOXYCYCLINE HYCLATE 100 MG PO CAPS: 100.0000 mg | ORAL_CAPSULE | Freq: Two times a day (BID) | ORAL | Status: DC

## 2014-09-05 MED ORDER — IPRATROPIUM BROMIDE 0.06 % NA SOLN: 2.0000 | Freq: Four times a day (QID) | NASAL | Status: DC

## 2014-09-05 NOTE — ED Notes (Signed)
Pt complains of fatigue, congestion, ear pain, headches pain 3/10 lasting 2 weeks.  Pt also has red bump on R leg that she thinks is an insect bite.

## 2014-09-05 NOTE — ED Provider Notes (Signed)
Barbara Mcmahon is a 38 y.o. female who presents to Urgent Care today for sinus congestion and pressure. Symptoms present for about 2 weeks. Patient has nasal discharge post nasal drip and headache. She's tried multiple over-the-counter medications which have not helped much. No fevers or chills vomiting or diarrhea.  Additionally patient has a painful papule on her right leg gets been present for a week or so. She is concerned that she may have been bitten by a bug.   Past Medical History  Diagnosis Date  . PCOS (polycystic ovarian syndrome)   . IBS (irritable bowel syndrome)   . Abnormal Pap smear and cervical HPV (human papillomavirus)   . Generalized anxiety disorder   . Seizure disorder    Past Surgical History  Procedure Laterality Date  . Colposcopy    . Gynecologic cryosurgery    . Cholecystectomy    . Dilation and curettage of uterus    . Knee surgery  right   History  Substance Use Topics  . Smoking status: Never Smoker   . Smokeless tobacco: Never Used  . Alcohol Use: No   ROS as above Medications: No current facility-administered medications for this encounter.   Current Outpatient Prescriptions  Medication Sig Dispense Refill  . cetirizine (ZYRTEC) 10 MG tablet Take 10 mg by mouth daily.    Marland Kitchen desogestrel-ethinyl estradiol (KARIVA,AZURETTE,MIRCETTE) 0.15-0.02/0.01 MG (21/5) tablet Take 1 tablet by mouth daily.    Marland Kitchen doxycycline (VIBRAMYCIN) 100 MG capsule Take 1 capsule (100 mg total) by mouth 2 (two) times daily. 14 capsule 0  . EPINEPHrine (EPI-PEN) 0.3 mg/0.3 mL SOAJ injection Inject 0.3 mLs (0.3 mg total) into the muscle once as needed (anaphylaxis). 1 Device 1  . fluticasone (FLONASE) 50 MCG/ACT nasal spray One spray in each nostril twice a day, use left hand for right nostril, and right hand for left nostril. 48 g 3  . Ibuprofen (ADVIL PO) Take 800 mg by mouth.    Marland Kitchen ipratropium (ATROVENT) 0.06 % nasal spray Place 2 sprays into both nostrils 4 (four) times  daily. 15 mL 1  . LORazepam (ATIVAN) 0.5 MG tablet One by mouth twice a day as needed for travel anxiety. 20 tablet 0  . oxyCODONE-acetaminophen (PERCOCET) 10-325 MG per tablet Take 1 tablet by mouth every 8 (eight) hours as needed for pain. 20 tablet 0  . predniSONE (DELTASONE) 10 MG tablet Take 3 tablets (30 mg total) by mouth daily. 15 tablet 0  . Prenatal Vit-Fe Fumarate-FA (MULTIVITAMIN-PRENATAL) 27-0.8 MG TABS Take 1 tablet by mouth daily.    Marland Kitchen terbinafine (LAMISIL) 250 MG tablet Take 1 tablet (250 mg total) by mouth daily. 14 tablet 0  . traMADol (ULTRAM) 50 MG tablet Take 1 tablet (50 mg total) by mouth every 8 (eight) hours as needed. 30 tablet 0  . venlafaxine XR (EFFEXOR XR) 75 MG 24 hr capsule Take 1 capsule (75 mg total) by mouth daily. 90 capsule 1   Allergies  Allergen Reactions  . Codeine Hives and Itching    Pt says she can take percocet  . Morphine And Related Other (See Comments)    Big mood changes  . Robitussin A-C [Guaifenesin-Codeine] Itching  . Shrimp [Shellfish Allergy] Hives and Swelling    Throat closes     Exam:  BP 146/89 mmHg  Pulse 94  Temp(Src) 97.8 F (36.6 C) (Oral)  Ht 5\' 4"  (1.626 m)  Wt 266 lb 8 oz (120.884 kg)  BMI 45.72 kg/m2  SpO2 100% Gen: Well  NAD HEENT: EOMI,  MMM clear nasal discharge. Normal tympanic membranes bilaterally. Mildly tender bilateral maxillary sinuses. Posterior pharynx with cobblestoning. Normal tympanic membranes bilaterally. Lungs: Normal work of breathing. CTABL Heart: RRR no MRG Abd: NABS, Soft. Nondistended, Nontender Exts: Brisk capillary refill, warm and well perfused.  Skin: Right leg Small tender erythematous papule. No fluctuance.  No results found for this or any previous visit (from the past 24 hour(s)). No results found.  Assessment and Plan: 38 y.o. female with  1) sinusitis. Treatment with doxycycline and prednisone and Atrovent nasal spray  2) folliculitis doxycycline  Discussed warning signs or  symptoms. Please see discharge instructions. Patient expresses understanding.     Gregor Hams, MD 09/05/14 [phone]

## 2014-09-05 NOTE — Discharge Instructions (Signed)
Thank you for coming in today. Use backup birth control methods while taking doxycycline antibiotics Take prednisone  Call or go to the emergency room if you get worse, have trouble breathing, have chest pains, or palpitations.    Folliculitis  Folliculitis is redness, soreness, and swelling (inflammation) of the hair follicles. This condition can occur anywhere on the body. People with weakened immune systems, diabetes, or obesity have a greater risk of getting folliculitis. CAUSES  Bacterial infection. This is the most common cause.  Fungal infection.  Viral infection.  Contact with certain chemicals, especially oils and tars. Long-term folliculitis can result from bacteria that live in the nostrils. The bacteria may trigger multiple outbreaks of folliculitis over time. SYMPTOMS Folliculitis most commonly occurs on the scalp, thighs, legs, back, buttocks, and areas where hair is shaved frequently. An early sign of folliculitis is a small, white or yellow, pus-filled, itchy lesion (pustule). These lesions appear on a red, inflamed follicle. They are usually less than 0.2 inches (5 mm) wide. When there is an infection of the follicle that goes deeper, it becomes a boil or furuncle. A group of closely packed boils creates a larger lesion (carbuncle). Carbuncles tend to occur in hairy, sweaty areas of the body. DIAGNOSIS  Your caregiver can usually tell what is wrong by doing a physical exam. A sample may be taken from one of the lesions and tested in a lab. This can help determine what is causing your folliculitis. TREATMENT  Treatment may include:  Applying warm compresses to the affected areas.  Taking antibiotic medicines orally or applying them to the skin.  Draining the lesions if they contain a large amount of pus or fluid.  Laser hair removal for cases of long-lasting folliculitis. This helps to prevent regrowth of the hair. HOME CARE INSTRUCTIONS  Apply warm compresses to  the affected areas as directed by your caregiver.  If antibiotics are prescribed, take them as directed. Finish them even if you start to feel better.  You may take over-the-counter medicines to relieve itching.  Do not shave irritated skin.  Follow up with your caregiver as directed. SEEK IMMEDIATE MEDICAL CARE IF:   You have increasing redness, swelling, or pain in the affected area.  You have a fever. MAKE SURE YOU:  Understand these instructions.  Will watch your condition.  Will get help right away if you are not doing well or get worse. Document Released: 12/03/2001 Document Revised: 03/25/2012 Document Reviewed: 12/25/2011 [facility] Patient Information 2015 [place], [place]. This information is not intended to replace advice given to you by your health care provider. Make sure you discuss any questions you have with your health care provider.   Sinusitis Sinusitis is redness, soreness, and inflammation of the paranasal sinuses. Paranasal sinuses are air pockets within the bones of your face (beneath the eyes, the middle of the forehead, or above the eyes). In healthy paranasal sinuses, mucus is able to drain out, and air is able to circulate through them by way of your nose. However, when your paranasal sinuses are inflamed, mucus and air can become trapped. This can allow bacteria and other germs to grow and cause infection. Sinusitis can develop quickly and last only a short time (acute) or continue over a long period (chronic). Sinusitis that lasts for more than 12 weeks is considered chronic.  CAUSES  Causes of sinusitis include:  Allergies.  Structural abnormalities, such as displacement of the cartilage that separates your nostrils (deviated septum), which can decrease the  air flow through your nose and sinuses and affect sinus drainage.  Functional abnormalities, such as when the small hairs (cilia) that line your sinuses and help remove mucus do not work properly or  are not present. SIGNS AND SYMPTOMS  Symptoms of acute and chronic sinusitis are the same. The primary symptoms are pain and pressure around the affected sinuses. Other symptoms include:  Upper toothache.  Earache.  Headache.  Bad breath.  Decreased sense of smell and taste.  A cough, which worsens when you are lying flat.  Fatigue.  Fever.  Thick drainage from your nose, which often is green and may contain pus (purulent).  Swelling and warmth over the affected sinuses. DIAGNOSIS  Your health care provider will perform a physical exam. During the exam, your health care provider may:  Look in your nose for signs of abnormal growths in your nostrils (nasal polyps).  Tap over the affected sinus to check for signs of infection.  View the inside of your sinuses (endoscopy) using an imaging device that has a light attached (endoscope). If your health care provider suspects that you have chronic sinusitis, one or more of the following tests may be recommended:  Allergy tests.  Nasal culture. A sample of mucus is taken from your nose, sent to a lab, and screened for bacteria.  Nasal cytology. A sample of mucus is taken from your nose and examined by your health care provider to determine if your sinusitis is related to an allergy. TREATMENT  Most cases of acute sinusitis are related to a viral infection and will resolve on their own within 10 days. Sometimes medicines are prescribed to help relieve symptoms (pain medicine, decongestants, nasal steroid sprays, or saline sprays).  However, for sinusitis related to a bacterial infection, your health care provider will prescribe antibiotic medicines. These are medicines that will help kill the bacteria causing the infection.  Rarely, sinusitis is caused by a fungal infection. In theses cases, your health care provider will prescribe antifungal medicine. For some cases of chronic sinusitis, surgery is needed. Generally, these are cases  in which sinusitis recurs more than 3 times per year, despite other treatments. HOME CARE INSTRUCTIONS   Drink plenty of water. Water helps thin the mucus so your sinuses can drain more easily.  Use a humidifier.  Inhale steam 3 to 4 times a day (for example, sit in the bathroom with the shower running).  Apply a warm, moist washcloth to your face 3 to 4 times a day, or as directed by your health care provider.  Use saline nasal sprays to help moisten and clean your sinuses.  Take medicines only as directed by your health care provider.  If you were prescribed either an antibiotic or antifungal medicine, finish it all even if you start to feel better. SEEK IMMEDIATE MEDICAL CARE IF:  You have increasing pain or severe headaches.  You have nausea, vomiting, or drowsiness.  You have swelling around your face.  You have vision problems.  You have a stiff neck.  You have difficulty breathing. MAKE SURE YOU:   Understand these instructions.  Will watch your condition.  Will get help right away if you are not doing well or get worse. Document Released: 09/24/2005 Document Revised: 02/08/2014 Document Reviewed: 10/09/2011 [facility] Patient Information 2015 [place], [place]. This information is not intended to replace advice given to you by your health care provider. Make sure you discuss any questions you have with your health care provider.

## 2014-10-07 ENCOUNTER — Ambulatory Visit (INDEPENDENT_AMBULATORY_CARE_PROVIDER_SITE_OTHER): Payer: BC Managed Care – PPO | Admitting: Family Medicine

## 2014-10-07 ENCOUNTER — Encounter: Payer: Self-pay | Admitting: Family Medicine

## 2014-10-07 DIAGNOSIS — L0291 Cutaneous abscess, unspecified: Secondary | ICD-10-CM

## 2014-10-07 DIAGNOSIS — L03818 Cellulitis of other sites: Secondary | ICD-10-CM

## 2014-10-07 DIAGNOSIS — L0390 Cellulitis, unspecified: Secondary | ICD-10-CM

## 2014-10-07 MED ORDER — SULFAMETHOXAZOLE-TRIMETHOPRIM 800-160 MG PO TABS: 1.0000 | ORAL_TABLET | Freq: Two times a day (BID) | ORAL | Status: DC

## 2014-10-07 NOTE — Patient Instructions (Signed)
Community-Associated MRSA °CA-MRSA stands for community-associated methicillin-resistant Staphylococcus aureus. MRSA is a type of bacteria that is resistant to some common antibiotics. It can cause infections in the skin and many other places in the body. Staphylococcus aureus, often called "staph," is a bacteria that normally lives on the skin or in the nose. Staph on the surface of the skin or in the nose does not cause problems. However, if the staph enters the body through a cut, wound, or break in the skin, an infection can happen. °Up until recently, infections with the MRSA type of staph mainly occurred in hospitals and other health care settings. There are now increasing problems with MRSA infections in the community as well. Infections with MRSA may be very serious or even life threatening. °CA-MRSA is becoming more common. It is known to spread in crowded settings, in jails and prisons, and in situations where there is close skin-to-skin contact, such as during sporting events or in locker rooms. MRSA can be spread through shared items, such as children's toys, razors, towels, or sports equipment.  °CAUSES °All staph, including MRSA, are normally harmless unless they enter the body through a scratch, cut, or wound, such as with surgery. All staph, including MRSA, can be spread from person-to-person by touching contaminated objects or through direct contact. °· MRSA now causes illness in people who have not been in hospitals or other health care facilities. Cases of MRSA diseases in the community have been associated with: °¨ Recent antibiotic use. °¨ Sharing contaminated towels or clothes. °¨ Having active skin diseases. °¨ Participating in contact sports. °¨ Living in crowded settings. °¨ Intravenous (IV) drug use. °· Community-associated MRSA infections are usually skin infections, but may cause other severe illnesses. °· Staph bacteria are one of the most common causes of skin infection. However, they  are also a common cause of pneumonia, bone or joint infections, and bloodstream infections. °DIAGNOSIS °Diagnosis of MRSA is done by cultures of fluid samples that may come from: °· Swabs taken from cuts or wounds in infected areas. °· Nasal swabs. °· Saliva or deep cough specimens from the lungs (sputum). °· Urine. °· Blood. °Many people are "colonized" with MRSA but have no signs of infection. This means that people carry the MRSA germ on their skin or in their nose and may never develop MRSA infection.  °TREATMENT  °Treatment varies and is based on how serious, how deep, or how extensive the infection is. For example: °· Some skin infections, such as a small boil or abscess, may be treated by draining yellowish-white fluid (pus) from the site of the infection. °· Deeper or more widespread soft tissue infections are usually treated with surgery to drain pus and with antibiotic medicine given by vein or by mouth. This may be recommended even if you are pregnant. °· Serious infections may require a hospital stay. °If antibiotics are given, they may be needed for several weeks. °PREVENTION °Because many people are colonized with staph, including MRSA, preventing the spread of the bacteria from person-to-person is most important. The best way to prevent the spread of bacteria and other germs is through proper hand washing or by using alcohol-based hand disinfectants. The following are other ways to help prevent MRSA infection within community settings.  °· Wash your hands frequently with soap and water for at least 15 seconds. Otherwise, use alcohol-based hand disinfectants when soap and water is not available. °· Make sure people who live with you wash their hands often, too. °·   Do not share personal items. For example, avoid sharing razors and other personal hygiene items, towels, clothing, and athletic equipment. °· Wash and dry your clothes and bedding at the warmest temperatures recommended on the labels. °· Keep  wounds covered. Pus from infected sores may contain MRSA and other bacteria. Keep cuts and abrasions clean and covered with germ-free (sterile), dry bandages until they are healed. °· If you have a wound that appears infected, ask your caregiver if a culture for MRSA and other bacteria should be done. °· If you are breastfeeding, talk to your caregiver about MRSA. You may be asked to temporarily stop breastfeeding. °HOME CARE INSTRUCTIONS  °· Take your antibiotics as directed. Finish them even if you start to feel better. °· Avoid close contact with those around you as much as possible. Do not use towels, razors, toothbrushes, bedding, or other items that will be used by others. °· To fight the infection, follow your caregiver's instructions for wound care. Wash your hands before and after changing your bandages. °· If you have an intravascular device, such as a catheter, make sure you know how to care for it. °· Be sure to tell any health care providers that you have MRSA so they are aware of your infection. °SEEK IMMEDIATE MEDICAL CARE IF: °· The infection appears to be getting worse. Signs include: °¨ Increased warmth, redness, or tenderness around the wound site. °¨ A red line that extends from the infection site. °¨ A dark color in the area around the infection. °¨ Wound drainage that is tan, yellow, or green. °¨ A bad smell coming from the wound. °· You feel sick to your stomach (nauseous) and throw up (vomit) or cannot keep medicine down. °· You have a fever. °· Your baby is older than 3 months with a rectal temperature of 102°F (38.9°C) or higher. °· Your baby is 3 months old or younger with a rectal temperature of 100.4°F (38°C) or higher. °· You have difficulty breathing. °MAKE SURE YOU:  °· Understand these instructions. °· Will watch your condition. °· Will get help right away if you are not doing well or get worse. °Document Released: 12/28/2005 Document Revised: 02/08/2014 Document Reviewed:  12/28/2010 °[employer]® Patient Information ©2015 [employer]. This information is not intended to replace advice given to you by your health care provider. Make sure you discuss any questions you have with your health care provider. ° °

## 2014-10-07 NOTE — Progress Notes (Signed)
   Subjective:    Patient ID: Barbara Mcmahon, female    DOB: April 26, 1976, 38 y.o.   MRN: [id number]  HPI patient has a couple of skin lesions that she would like me to take a look at today. She's worried that it could be a MRSA infection as her husband has had similar lesions recently. She does not share towels with him. Washes them after every wash. She does not use scrubbers that hang in the shower. She was treated for folliculitis on her right lower extremity several months ago at urgent care. She says the lesion on her left lower abdomen progress and then had some puslike drainage from it. The one on the right has not drained.  Review of Systems     Objective:   Physical Exam  Constitutional: She appears well-developed and well-nourished.  HENT:  Head: Normocephalic and atraumatic.  Abdominal: Soft. Bowel sounds are normal.  Skin:  On her right inner thigh she has an approximately 1 x 2 7 m area of erythema. The area of induration underneath is approximately 1 similar in size. Has a small pustule on top. The lesion on the left lower abdomen is a little bit smaller at 0.5 cm x 1 cm. There is a smaller half centimeter area of induration underneath. And some dry scaling skin on the surface what appears to be healing. No open wound or drainage.  Psychiatric: She has a normal mood and affect. Her behavior is normal.          Assessment & Plan:  Abscess/cellulitis-I think both of these are amenable to oral antibiotics with warm compresses to help them open and drain on their own. Do not think that they need to be lanced at this time. Patient opted to do oral and a box and warm compresses. She will call to not resolving. Or if they get worse, notices spreading of the erythema, or increase in pain, or develops fevers chills or sweats. Additional handout and information provided.

## 2014-10-08 DIAGNOSIS — Z8614 Personal history of Methicillin resistant Staphylococcus aureus infection: Secondary | ICD-10-CM

## 2014-10-08 HISTORY — DX: Personal history of Methicillin resistant Staphylococcus aureus infection: Z86.14

## 2014-10-11 ENCOUNTER — Encounter: Payer: Self-pay | Admitting: Family Medicine

## 2014-10-11 ENCOUNTER — Ambulatory Visit (INDEPENDENT_AMBULATORY_CARE_PROVIDER_SITE_OTHER): Payer: BLUE CROSS/BLUE SHIELD | Admitting: Family Medicine

## 2014-10-11 VITALS — BP 131/86 | HR 91 | Temp 97.9°F | Wt 267.0 lb

## 2014-10-11 DIAGNOSIS — L03119 Cellulitis of unspecified part of limb: Secondary | ICD-10-CM

## 2014-10-11 DIAGNOSIS — L02219 Cutaneous abscess of trunk, unspecified: Secondary | ICD-10-CM | POA: Diagnosis not present

## 2014-10-11 DIAGNOSIS — L02419 Cutaneous abscess of limb, unspecified: Secondary | ICD-10-CM

## 2014-10-11 DIAGNOSIS — L03319 Cellulitis of trunk, unspecified: Secondary | ICD-10-CM

## 2014-10-11 MED ORDER — DOXYCYCLINE HYCLATE 100 MG PO TABS: 100.0000 mg | ORAL_TABLET | Freq: Two times a day (BID) | ORAL | Status: DC

## 2014-10-11 NOTE — Addendum Note (Signed)
Addended by: Teddy Spike on: 10/11/2014 04:31 PM   Modules accepted: Orders

## 2014-10-11 NOTE — Progress Notes (Signed)
   Subjective:    Patient ID: Barbara Mcmahon, female    DOB: 12/16/1975, 39 y.o.   MRN: [id number]  HPI Here today to follow-up on 2 small abscesses one on the left lower abdomen and one on the right inner thigh. She has been taking her in about it consistently for the last 5 days and feels like her not really getting any better. In fact she's noticed 2 new additional lesions. One under her chin and one on her right flank. They're tender and erythematous. Still no fevers chills or sweats. Known exposure to MRSA from her husband. The she has been exercising regularly and has lost 10 pounds.   Review of Systems     Objective:   Physical Exam  Constitutional: She appears well-developed and well-nourished.  HENT:  Head: Normocephalic and atraumatic.  Skin: Skin is warm and dry.  Small approximately half centimeter abscess on the left lower abdomen which appears to be healing compared to last week. Lesion on the right anterior thigh also feels like it might be a little bit smaller but definitely not worse. She has a new lesion on the right flank which is approximately 1 cm area of erythema and induration is about half a centimeter. And she has a pustule on her face under her chin. It's a lot little smaller at 0.5 cm  Psychiatric: She has a normal mood and affect. Her behavior is normal.       Assessment & Plan:  Small abscesses-we'll go ahead and stop the Bactrim and switch her to Doxy. We were able to get a culture on the lesion on the right inner thigh. It did express a small amount of pus when incised. The one on her right lower back did not have any pus or drainage. Just had a small ball of sebum in the center. Will call with culture results once available. Encouraged her to go ahead and start doing the Hibiclens scrubs twice a week. We did discuss that some of this could be folliculitis and could be related to her recent exercise which may be causing some extra friction on the hair  follicles  Incision and Drainage Procedure Note  Pre-operative Diagnosis: abscess   Post-operative Diagnosis: same  Indications: pain, tenderness and spreading erythema  Anesthesia: 1% lidocaine with epinephrine  Procedure Details  The procedure, risks and complications have been discussed in detail (including, but not limited to airway compromise, infection, bleeding) with the patient, and the patient has signed consent to the procedure.  The skin was sterilely prepped and draped over the affected area in the usual fashion. After adequate local anesthesia, I&D with a #11 blade was performed on the lesion on right inner thigh and right flank. Purulent drainage: present on lesion on inner thigh. Lesion on right flank with no pus but small nodule of sebum.  The patient was observed until stable.  Findings: Await wound culture   EBL: 0 cc's  Drains: none  Condition: Tolerated procedure well   Complications: none.

## 2014-10-14 LAB — WOUND CULTURE
GRAM STAIN: NONE SEEN
Gram Stain: NONE SEEN

## 2014-10-28 ENCOUNTER — Other Ambulatory Visit: Payer: Self-pay | Admitting: Obstetrics and Gynecology

## 2014-10-28 DIAGNOSIS — R928 Other abnormal and inconclusive findings on diagnostic imaging of breast: Secondary | ICD-10-CM

## 2014-11-08 ENCOUNTER — Ambulatory Visit
Admission: RE | Admit: 2014-11-08 | Discharge: 2014-11-08 | Disposition: A | Discharge status: Home / Self Care | Payer: BLUE CROSS/BLUE SHIELD | Source: Ambulatory Visit | Attending: Obstetrics and Gynecology | Admitting: Obstetrics and Gynecology

## 2014-11-08 DIAGNOSIS — R928 Other abnormal and inconclusive findings on diagnostic imaging of breast: Secondary | ICD-10-CM

## 2014-11-15 ENCOUNTER — Encounter: Payer: Self-pay | Admitting: Family Medicine

## 2014-11-15 ENCOUNTER — Ambulatory Visit (INDEPENDENT_AMBULATORY_CARE_PROVIDER_SITE_OTHER): Payer: BLUE CROSS/BLUE SHIELD | Admitting: Family Medicine

## 2014-11-15 VITALS — BP 126/79 | HR 89 | Temp 98.0°F | Wt 268.0 lb

## 2014-11-15 DIAGNOSIS — B9689 Other specified bacterial agents as the cause of diseases classified elsewhere: Secondary | ICD-10-CM

## 2014-11-15 DIAGNOSIS — A499 Bacterial infection, unspecified: Secondary | ICD-10-CM | POA: Diagnosis not present

## 2014-11-15 DIAGNOSIS — J329 Chronic sinusitis, unspecified: Secondary | ICD-10-CM | POA: Diagnosis not present

## 2014-11-15 MED ORDER — LEVOFLOXACIN 500 MG PO TABS: 500.0000 mg | ORAL_TABLET | Freq: Every day | ORAL | Status: DC

## 2014-11-15 NOTE — Progress Notes (Signed)
CC: Barbara Mcmahon is a 39 y.o. female is here for Sinusitis   Subjective: HPI:  Complains of facial pressure underneath both left and right eyes that radiates into the nose.accompanied by thick nasal congestion and productive cough. Symptoms have been present for about 7 days now and drastically got worse over the weekend now moderate to severe in severity. No benefit frompseudoephedrine. No other interventions as of yet. Denies wheezing, shortness of breath, fatigue, chest pain, motor or sensory disturbances, nor rash.   Review Of Systems Outlined In HPI  Past Medical History  Diagnosis Date  . PCOS (polycystic ovarian syndrome)   . IBS (irritable bowel syndrome)   . Abnormal Pap smear and cervical HPV (human papillomavirus)   . Generalized anxiety disorder   . Seizure disorder     Past Surgical History  Procedure Laterality Date  . Colposcopy    . Gynecologic cryosurgery    . Cholecystectomy    . Dilation and curettage of uterus    . Knee surgery  right   Family History  Problem Relation Age of Onset  . Hypertension Father   . Diabetes Paternal Grandmother   . Hypertension Paternal Grandmother   . Stroke Paternal Grandmother   . Diabetes Paternal Grandfather   . Hypertension Paternal Grandfather   . Stroke Paternal Grandfather   . Hypertension Mother     History   Social History  . Marital Status: Married    Spouse Name: N/A    Number of Children: N/A  . Years of Education: N/A   Occupational History  . Not on file.   Social History Main Topics  . Smoking status: Never Smoker   . Smokeless tobacco: Never Used  . Alcohol Use: No  . Drug Use: No  . Sexual Activity: Not Currently   Other Topics Concern  . Not on file   Social History Narrative     Objective: BP 126/79 mmHg  Pulse 89  Temp(Src) 98 F (36.7 C) (Oral)  Wt 268 lb (121.564 kg)  General: Alert and Oriented, No Acute Distress HEENT: Pupils equal, round, reactive to light. Conjunctivae  clear.  External ears unremarkable, canals clear with intact TMs with appropriate landmarks.  Middle ear appears to have a mild serous effusion bilaterally. Pink inferior turbinates.  Moist mucous membranes, pharynx without inflammation nor lesions.  Neck supple without palpable lymphadenopathy nor abnormal masses. Lungs: Clear to auscultation bilaterally, no wheezing/ronchi/rales.  Comfortable work of breathing. Good air movement. Extremities: No peripheral edema.  Strong peripheral pulses.  Mental Status: No depression, anxiety, nor agitation. Skin: Warm and dry.  Assessment & Plan: Tamlyn was seen today for sinusitis.  Diagnoses and associated orders for this visit:  Bacterial sinusitis - levofloxacin (LEVAQUIN) 500 MG tablet; Take 1 tablet (500 mg total) by mouth daily.    Bacterial sinusitis: Start nasal saline washes and Levaquin, she was recently on doxycycline about a month ago.   Return if symptoms worsen or fail to improve.

## 2014-12-01 ENCOUNTER — Ambulatory Visit (INDEPENDENT_AMBULATORY_CARE_PROVIDER_SITE_OTHER): Payer: BLUE CROSS/BLUE SHIELD | Admitting: Physician Assistant

## 2014-12-01 ENCOUNTER — Encounter: Payer: Self-pay | Admitting: Physician Assistant

## 2014-12-01 ENCOUNTER — Telehealth: Payer: Self-pay | Admitting: *Deleted

## 2014-12-01 VITALS — BP 121/67 | HR 99 | Temp 98.2°F | Ht 64.0 in | Wt 269.0 lb

## 2014-12-01 DIAGNOSIS — L03319 Cellulitis of trunk, unspecified: Secondary | ICD-10-CM

## 2014-12-01 DIAGNOSIS — L02219 Cutaneous abscess of trunk, unspecified: Secondary | ICD-10-CM

## 2014-12-01 MED ORDER — SULFAMETHOXAZOLE-TRIMETHOPRIM 800-160 MG PO TABS: 1.0000 | ORAL_TABLET | Freq: Two times a day (BID) | ORAL | Status: DC

## 2014-12-01 MED ORDER — MUPIROCIN 2 % EX OINT: TOPICAL_OINTMENT | CUTANEOUS | Status: DC

## 2014-12-01 NOTE — Progress Notes (Signed)
   Subjective:    Patient ID: Carli Lefevers, female    DOB: 03/19/1976, 39 y.o.   MRN: [id number]  HPI  Patient is a 39 year old female who presents to the clinic with a tender pustule with surrounding erythema on her abdomen. She does have a history of a positive MRSA culture. She has noticed the lesion for approximately 4-5 days. She was out of town. It does seem to be getting larger. She denies any fever, chills, nausea or vomiting. She has been using warm compresses with no results. It is not draining.  Review of Systems  All other systems reviewed and are negative.      Objective:   Physical Exam  Constitutional: She is oriented to person, place, and time. She appears well-developed and well-nourished.  HENT:  Head: Normocephalic and atraumatic.  Cardiovascular: Normal rate, regular rhythm and normal heart sounds.   Neurological: She is alert and oriented to person, place, and time.  Skin:     Psychiatric: She has a normal mood and affect. Her behavior is normal.          Assessment & Plan:  Cellulitis of trunk-I do not think needs to be drained today. I looked at last sensitivity report and showed sensitivity to Bactrim. Treated with Bactrim for 10 days. Measured for patient and let her know if increasing in size or not improving to call office. I did discuss the possibility of some nasal colonization. I did give her some Bactrim to use nasally for the next 7 days. Then she can use it fully for the next couple months to cut down on colonization. Encouraged patient to possibly consider switching to dial soap for more antibacterial protection.

## 2014-12-01 NOTE — Telephone Encounter (Signed)
Called patient & made an appt for 3:45 today 12/01/13 with Luvenia Starch per Dr Madilyn Fireman

## 2014-12-01 NOTE — Patient Instructions (Addendum)
6.75 by 5cm.   Cellulitis Cellulitis is an infection of the skin and the tissue beneath it. The infected area is usually red and tender. Cellulitis occurs most often in the arms and lower legs.  CAUSES  Cellulitis is caused by bacteria that enter the skin through cracks or cuts in the skin. The most common types of bacteria that cause cellulitis are staphylococci and streptococci. SIGNS AND SYMPTOMS   Redness and warmth.  Swelling.  Tenderness or pain.  Fever. DIAGNOSIS  Your health care provider can usually determine what is wrong based on a physical exam. Blood tests may also be done. TREATMENT  Treatment usually involves taking an antibiotic medicine. HOME CARE INSTRUCTIONS   Take your antibiotic medicine as directed by your health care provider. Finish the antibiotic even if you start to feel better.  Keep the infected arm or leg elevated to reduce swelling.  Apply a warm cloth to the affected area up to 4 times per day to relieve pain.  Take medicines only as directed by your health care provider.  Keep all follow-up visits as directed by your health care provider. SEEK MEDICAL CARE IF:   You notice red streaks coming from the infected area.  Your red area gets larger or turns dark in color.  Your bone or joint underneath the infected area becomes painful after the skin has healed.  Your infection returns in the same area or another area.  You notice a swollen bump in the infected area.  You develop new symptoms.  You have a fever. SEEK IMMEDIATE MEDICAL CARE IF:   You feel very sleepy.  You develop vomiting or diarrhea.  You have a general ill feeling (malaise) with muscle aches and pains. MAKE SURE YOU:   Understand these instructions.  Will watch your condition.  Will get help right away if you are not doing well or get worse. Document Released: 07/04/2005 Document Revised: 02/08/2014 Document Reviewed: 12/10/2011 [facility] Patient Information 2015  [place], [place]. This information is not intended to replace advice given to you by your health care provider. Make sure you discuss any questions you have with your health care provider.

## 2014-12-01 NOTE — Telephone Encounter (Signed)
Pt called and stated she thinks that she is experiencing another MRSA outbreak and wanted to know if Dr. Madilyn Fireman would send something into her pharmacy or does she need to be seen. She stated that she had a pustule that did erupt on her trunk but is not having any problems now. Please advise if pt should be seen or can rx be sent.Audelia Hives [place]

## 2014-12-01 NOTE — Telephone Encounter (Signed)
Lets have her come in. We have a 3:45 today.

## 2014-12-04 ENCOUNTER — Emergency Department
Admission: EM | Admit: 2014-12-04 | Discharge: 2014-12-04 | Disposition: A | Discharge status: Home / Self Care | Payer: BLUE CROSS/BLUE SHIELD | Source: Home / Self Care | Attending: Family Medicine | Admitting: Family Medicine

## 2014-12-04 DIAGNOSIS — L03311 Cellulitis of abdominal wall: Secondary | ICD-10-CM

## 2014-12-04 MED ORDER — DOXYCYCLINE HYCLATE 100 MG PO CAPS: 100.0000 mg | ORAL_CAPSULE | Freq: Two times a day (BID) | ORAL | Status: DC

## 2014-12-04 NOTE — ED Notes (Addendum)
Patient states was recently seen and diagnosed with cellulitis and given Bactrim that she is currently taking, she states that it has not gotten better but she does keep it covered with a band aid all the time.

## 2014-12-04 NOTE — ED Provider Notes (Signed)
CSN: [id number]     Arrival date & time 12/04/14  1226 History   First MD Initiated Contact with Patient 12/04/14 1406     Chief Complaint  Patient presents with  . Cellulitis    abdomen and under right arm       HPI Comments: Patient complains of approximately 5 day history of painful rash on her mid-abdomen.  She visited her PCP 3 days ago who started her on Bactrim for cellulitis.  She reports that there has been no improvement and the area has become increasingly swollen and painful.  She feels well otherwise.  No fevers, chills, and sweats.  There has been no drainage from the site. 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  Patient is a 39 y.o. female presenting with abscess. The history is provided by the patient.  Abscess Location:  Torso Torso abscess location: mid-abdomen. Size:  6 cm Abscess quality: induration, painful, redness and warmth   Abscess quality: not draining (6cm) and no fluctuance   Duration:  5 days Progression:  Worsening Pain details:    Quality:  Throbbing   Severity:  Moderate   Duration:  5 days   Timing:  Constant   Progression:  Worsening Chronicity:  Recurrent Relieved by:  Nothing Exacerbated by: movement. Ineffective treatments: Bactrim DS. Associated symptoms: no fever and no nausea   Risk factors: family hx of MRSA, hx of MRSA and prior abscess     Past Medical History  Diagnosis Date  . PCOS (polycystic ovarian syndrome)   . IBS (irritable bowel syndrome)   . Abnormal Pap smear and cervical HPV (human papillomavirus)   . Generalized anxiety disorder   . Seizure disorder    Past Surgical History  Procedure Laterality Date  . Colposcopy    . Gynecologic cryosurgery    . Cholecystectomy    . Dilation and curettage of uterus    . Knee surgery  right   Family History  Problem Relation Age of Onset  . Hypertension Father   . Diabetes Paternal Grandmother   . Hypertension Paternal Grandmother   . Stroke Paternal Grandmother     . Diabetes Paternal Grandfather   . Hypertension Paternal Grandfather   . Stroke Paternal Grandfather   . Hypertension Mother    History  Substance Use Topics  . Smoking status: Never Smoker   . Smokeless tobacco: Never Used  . Alcohol Use: No   OB History    Gravida Para Term Preterm AB TAB SAB Ectopic Multiple Living   2 1 1  1  1   1      Review of Systems  Constitutional: Negative for fever.  Gastrointestinal: Negative for nausea.  All other systems reviewed and are negative.   Allergies  Codeine; Morphine and related; Oxycodone; Robitussin a-c; and Shrimp  Home Medications   Prior to Admission medications   Medication Sig Start Date End Date Taking? Authorizing Provider  cetirizine (ZYRTEC) 10 MG tablet Take 10 mg by mouth daily.    Historical Provider, MD  desogestrel-ethinyl estradiol (KARIVA,AZURETTE,MIRCETTE) 0.15-0.02/0.01 MG (21/5) tablet Take 1 tablet by mouth daily.    Historical Provider, MD  doxycycline (VIBRAMYCIN) 100 MG capsule Take 1 capsule (100 mg total) by mouth 2 (two) times daily. Take with food. 12/04/14   Kandra Nicolas, MD  EPINEPHrine (EPI-PEN) 0.3 mg/0.3 mL SOAJ injection Inject 0.3 mLs (0.3 mg total) into the muscle once as needed (anaphylaxis). 11/18/13   Marcial Pacas, DO  Ibuprofen (ADVIL PO)  Take 800 mg by mouth.    Historical Provider, MD  Multiple Vitamins-Minerals (MULTIVITAMIN ADULT PO) Take by mouth.    Historical Provider, MD  mupirocin ointment (BACTROBAN) 2 % Apply to affected area TID for 7 days. 12/01/14   Jade L Breeback, PA-C  Phentermine-Topiramate (QSYMIA) 7.5-46 MG CP24 Take 1 capsule by mouth. 10/06/14   Historical Provider, MD  sulfamethoxazole-trimethoprim (BACTRIM DS,SEPTRA DS) 800-160 MG per tablet Take 1 tablet by mouth 2 (two) times daily. For 10 days. 12/01/14   Jade L Breeback, PA-C  venlafaxine XR (EFFEXOR XR) 75 MG 24 hr capsule Take 1 capsule (75 mg total) by mouth daily. 05/12/14 05/12/15  Sean Hommel, DO   BP 132/80 mmHg   Pulse 88  Temp(Src) 98.1 F (36.7 C) (Oral)  Wt 268 lb (121.564 kg)  SpO2 100% Physical Exam  Constitutional: She is oriented to person, place, and time. She appears well-developed and well-nourished. No distress.  Patient is obese.  Eyes: Pupils are equal, round, and reactive to light.  Cardiovascular: Normal heart sounds.   Pulmonary/Chest: Breath sounds normal.  Neurological: She is alert and oriented to person, place, and time.  Skin: Skin is warm and dry.     On the mid-abdomen as noted on diagram is an approximately 6cm diameter exquisitely tender erythematous indurated area.  The greatest diameter of induration is about 3cm.    Nursing note and vitals reviewed.   ED Course  Procedures  Incise and drain cyst/abscess Risks and benefits of procedure explained to patient and verbal consent obtained.  Using sterile technique and local anesthesia with 1% lidocaine with epinephrine, cleansed affected area with Betadine and saline. Identified the most indurated area of lesion and incised with #11 blade.  Expressed blood but no purulent material.  Inserted Iodoform gauze packing.  Bandage applied.  Patient tolerated well      Labs Reviewed  WOUND CULTURE      MDM   1. Cellulitis, abdominal wall    Wound culture pending. Rx for doxycycline 100mg  BID Leave bandage in place until follow-up visit tomorrow.  Stop Bactrim and begin doxycycline.  May take Tylenol and/or Ibuprofen for pain.  Continue to apply heating pad 3 to 4 times daily. Return tomorrow. Suggest follow-up with her PCP to test for staph colonization of nares.   Kandra Nicolas, MD 12/04/14 [phone]

## 2014-12-04 NOTE — Discharge Instructions (Signed)
Leave bandage in place until follow-up visit tomorrow.  Stop Bactrim and begin doxycycline.  May take Tylenol and/or Ibuprofen for pain.  Continue to apply heating pad 3 to 4 times daily.   Cellulitis Cellulitis is an infection of the skin and the tissue beneath it. The infected area is usually red and tender. Cellulitis occurs most often in the arms and lower legs.  CAUSES  Cellulitis is caused by bacteria that enter the skin through cracks or cuts in the skin. The most common types of bacteria that cause cellulitis are staphylococci and streptococci. SIGNS AND SYMPTOMS   Redness and warmth.  Swelling.  Tenderness or pain.  Fever. DIAGNOSIS  Your health care provider can usually determine what is wrong based on a physical exam. Blood tests may also be done. TREATMENT  Treatment usually involves taking an antibiotic medicine. HOME CARE INSTRUCTIONS   Take your antibiotic medicine as directed by your health care provider. Finish the antibiotic even if you start to feel better.  Keep the infected arm or leg elevated to reduce swelling.  Apply a warm cloth to the affected area up to 4 times per day to relieve pain.  Take medicines only as directed by your health care provider.  Keep all follow-up visits as directed by your health care provider. SEEK MEDICAL CARE IF:   You notice red streaks coming from the infected area.  Your red area gets larger or turns dark in color.  Your bone or joint underneath the infected area becomes painful after the skin has healed.  Your infection returns in the same area or another area.  You notice a swollen bump in the infected area.  You develop new symptoms.  You have a fever. SEEK IMMEDIATE MEDICAL CARE IF:   You feel very sleepy.  You develop vomiting or diarrhea.  You have a general ill feeling (malaise) with muscle aches and pains. MAKE SURE YOU:   Understand these instructions.  Will watch your condition.  Will get help  right away if you are not doing well or get worse. Document Released: 07/04/2005 Document Revised: 02/08/2014 Document Reviewed: 12/10/2011 [facility] Patient Information 2015 [place], [place]. This information is not intended to replace advice given to you by your health care provider. Make sure you discuss any questions you have with your health care provider.

## 2014-12-05 ENCOUNTER — Emergency Department (INDEPENDENT_AMBULATORY_CARE_PROVIDER_SITE_OTHER)
Admission: EM | Admit: 2014-12-05 | Discharge: 2014-12-05 | Disposition: A | Discharge status: Home / Self Care | Payer: BLUE CROSS/BLUE SHIELD | Source: Home / Self Care | Attending: Family Medicine | Admitting: Family Medicine

## 2014-12-05 ENCOUNTER — Encounter: Payer: Self-pay | Admitting: *Deleted

## 2014-12-05 DIAGNOSIS — Z4801 Encounter for change or removal of surgical wound dressing: Secondary | ICD-10-CM

## 2014-12-05 NOTE — ED Notes (Signed)
Pt is here for a wound check of her abdomen that Dr. Assunta Found packed yesterday.  Pain is a little more tolerable 5/10.

## 2014-12-05 NOTE — Discharge Instructions (Signed)
Change bandage daily until healed.   Continue antibiotic.

## 2014-12-05 NOTE — ED Provider Notes (Signed)
CSN: [id number]     Arrival date & time 12/05/14  1310 History   First MD Initiated Contact with Patient 12/05/14 [phone]     Chief Complaint  Patient presents with  . Wound Check      HPI Comments: Patient returns for dressing change and removal of wound packing.  She reports that her abdominal wound pain is still present but has decreased.  No fevers, chills, and sweats.  The history is provided by the patient.    Past Medical History  Diagnosis Date  . PCOS (polycystic ovarian syndrome)   . IBS (irritable bowel syndrome)   . Abnormal Pap smear and cervical HPV (human papillomavirus)   . Generalized anxiety disorder   . Seizure disorder    Past Surgical History  Procedure Laterality Date  . Colposcopy    . Gynecologic cryosurgery    . Cholecystectomy    . Dilation and curettage of uterus    . Knee surgery  right   Family History  Problem Relation Age of Onset  . Hypertension Father   . Diabetes Paternal Grandmother   . Hypertension Paternal Grandmother   . Stroke Paternal Grandmother   . Diabetes Paternal Grandfather   . Hypertension Paternal Grandfather   . Stroke Paternal Grandfather   . Hypertension Mother    History  Substance Use Topics  . Smoking status: Never Smoker   . Smokeless tobacco: Never Used  . Alcohol Use: No   OB History    Gravida Para Term Preterm AB TAB SAB Ectopic Multiple Living   2 1 1  1  1   1      Review of Systems No fevers, chills, and sweats.  Review of systems otherwise negative Allergies  Codeine; Morphine and related; Oxycodone; Robitussin a-c; and Shrimp  Home Medications   Prior to Admission medications   Medication Sig Start Date End Date Taking? Authorizing Provider  cetirizine (ZYRTEC) 10 MG tablet Take 10 mg by mouth daily.    Historical Provider, MD  desogestrel-ethinyl estradiol (KARIVA,AZURETTE,MIRCETTE) 0.15-0.02/0.01 MG (21/5) tablet Take 1 tablet by mouth daily.    Historical Provider, MD  doxycycline (VIBRAMYCIN)  100 MG capsule Take 1 capsule (100 mg total) by mouth 2 (two) times daily. Take with food. 12/04/14   Kandra Nicolas, MD  EPINEPHrine (EPI-PEN) 0.3 mg/0.3 mL SOAJ injection Inject 0.3 mLs (0.3 mg total) into the muscle once as needed (anaphylaxis). 11/18/13   Marcial Pacas, DO  Ibuprofen (ADVIL PO) Take 800 mg by mouth.    Historical Provider, MD  Multiple Vitamins-Minerals (MULTIVITAMIN ADULT PO) Take by mouth.    Historical Provider, MD  mupirocin ointment (BACTROBAN) 2 % Apply to affected area TID for 7 days. 12/01/14   Jade L Breeback, PA-C  Phentermine-Topiramate (QSYMIA) 7.5-46 MG CP24 Take 1 capsule by mouth. 10/06/14   Historical Provider, MD  sulfamethoxazole-trimethoprim (BACTRIM DS,SEPTRA DS) 800-160 MG per tablet Take 1 tablet by mouth 2 (two) times daily. For 10 days. 12/01/14   Jade L Breeback, PA-C  venlafaxine XR (EFFEXOR XR) 75 MG 24 hr capsule Take 1 capsule (75 mg total) by mouth daily. 05/12/14 05/12/15  Sean Hommel, DO   BP 122/81 mmHg  Pulse 86  Wt 268 lb (121.564 kg)  SpO2 99% Physical Exam Patient appears comfortable and in no distress Abdomen:  Wound central abdomen has decreased surrounding erythema and tenderness.  Removed packing:  Wound shallow and minimal purulent discharge.  No further packing indicated.  Bandage applied. ED Course  Procedures       MDM   1. Dressing change or removal, surgical wound     Change bandage daily until healed.   Continue antibiotic. Followup with Family Doctor as needed.    Kandra Nicolas, MD 12/09/14 [phone]

## 2014-12-07 ENCOUNTER — Telehealth: Payer: Self-pay | Admitting: Emergency Medicine

## 2014-12-07 LAB — WOUND CULTURE
GRAM STAIN: NONE SEEN
Gram Stain: NONE SEEN

## 2014-12-30 ENCOUNTER — Telehealth: Payer: Self-pay | Admitting: Family Medicine

## 2014-12-30 DIAGNOSIS — Z22322 Carrier or suspected carrier of Methicillin resistant Staphylococcus aureus: Secondary | ICD-10-CM

## 2014-12-30 NOTE — Telephone Encounter (Signed)
Referral req

## 2015-01-13 ENCOUNTER — Ambulatory Visit: Payer: Self-pay | Admitting: Family Medicine

## 2015-01-13 ENCOUNTER — Other Ambulatory Visit: Payer: Self-pay | Admitting: Family Medicine

## 2015-01-14 ENCOUNTER — Ambulatory Visit (INDEPENDENT_AMBULATORY_CARE_PROVIDER_SITE_OTHER): Payer: BLUE CROSS/BLUE SHIELD | Admitting: Physician Assistant

## 2015-01-14 ENCOUNTER — Encounter: Payer: Self-pay | Admitting: Physician Assistant

## 2015-01-14 VITALS — BP 144/87 | HR 97 | Wt 269.0 lb

## 2015-01-14 DIAGNOSIS — L0390 Cellulitis, unspecified: Secondary | ICD-10-CM | POA: Diagnosis not present

## 2015-01-14 DIAGNOSIS — A4902 Methicillin resistant Staphylococcus aureus infection, unspecified site: Secondary | ICD-10-CM | POA: Diagnosis not present

## 2015-01-14 MED ORDER — DOXYCYCLINE HYCLATE 100 MG PO TABS: 100.0000 mg | ORAL_TABLET | Freq: Two times a day (BID) | ORAL | Status: DC

## 2015-01-14 MED ORDER — MUPIROCIN CALCIUM 2 % NA OINT: 1.0000 "application " | TOPICAL_OINTMENT | Freq: Two times a day (BID) | NASAL | Status: DC

## 2015-01-14 NOTE — Progress Notes (Signed)
   Subjective:    Patient ID: Devonna Oboyle, female    DOB: [birth year], 39 y.o.   MRN: [id number]  HPI Pt is a 39 yo female with a personal and family hx of MRSA and cellulitis. Her husband was just treated for MRSA. She noticed some lesions popping up 2 days ago. They are very warm to touch and painful. She has been using warm compresses. She knows they progress fast so she is hoping to get ahead of them. She has been using hibiclens to prevent them with no avail. Her husband was recently referred to ID.    Review of Systems  All other systems reviewed and are negative.      Objective:   Physical Exam  Constitutional: She is oriented to person, place, and time. She appears well-developed and well-nourished.  Obese.   Neurological: She is alert and oriented to person, place, and time.  Skin:     Psychiatric: She has a normal mood and affect. Her behavior is normal.          Assessment & Plan:  MRSA/cellulitis- treated with doxycycline.none of lesion seem ready to I and D. Hopefully can treat and avoid.  bactroban sent for nasal application for possible colonization. I would consider treated entire family nasally and even next month as well. Warm compresses. Follow up if not improving or worsening.

## 2015-02-24 ENCOUNTER — Telehealth: Payer: Self-pay | Admitting: *Deleted

## 2015-02-24 MED ORDER — SULFAMETHOXAZOLE-TRIMETHOPRIM 800-160 MG PO TABS: 1.0000 | ORAL_TABLET | Freq: Two times a day (BID) | ORAL | Status: DC

## 2015-02-24 NOTE — Telephone Encounter (Signed)
Pt left vm this morning stating that she is getting MRSA again for the 4th time and wanted to know if there was anyway you could just send her something in.  She stated that she will be in meetings all day today & is going out of town tomorrow. She was just treated in the beginning of April by [facility] for this too.  Please advise.

## 2015-02-24 NOTE — Telephone Encounter (Signed)
Bactrim Rx sent to [facility] on [address]

## 2015-02-24 NOTE — Telephone Encounter (Signed)
LMOM notifying pt of rx. 

## 2015-02-28 ENCOUNTER — Telehealth: Payer: Self-pay | Admitting: *Deleted

## 2015-02-28 MED ORDER — CLINDAMYCIN HCL 300 MG PO CAPS: 300.0000 mg | ORAL_CAPSULE | Freq: Three times a day (TID) | ORAL | Status: DC

## 2015-02-28 NOTE — Telephone Encounter (Signed)
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  She wants to know if she needs to have them lanced, and if so, can she come in to see dr Madilyn Fireman. Please advise.

## 2015-02-28 NOTE — Telephone Encounter (Signed)
Switch to clindamycin that has been sent to [facility] and if not better after 24 hours they probably need to be lanced

## 2015-02-28 NOTE — Telephone Encounter (Signed)
Pt notified of rx & recommendations. 

## 2015-03-17 DIAGNOSIS — R7303 Prediabetes: Secondary | ICD-10-CM | POA: Insufficient documentation

## 2015-03-17 DIAGNOSIS — E785 Hyperlipidemia, unspecified: Secondary | ICD-10-CM | POA: Insufficient documentation

## 2015-04-10 ENCOUNTER — Other Ambulatory Visit: Payer: Self-pay | Admitting: Family Medicine

## 2015-05-12 ENCOUNTER — Other Ambulatory Visit: Payer: Self-pay | Admitting: Family Medicine

## 2015-05-26 ENCOUNTER — Other Ambulatory Visit: Payer: Self-pay | Admitting: Family Medicine

## 2015-06-24 ENCOUNTER — Encounter: Payer: Self-pay | Admitting: Family Medicine

## 2015-06-24 ENCOUNTER — Ambulatory Visit (INDEPENDENT_AMBULATORY_CARE_PROVIDER_SITE_OTHER): Payer: BLUE CROSS/BLUE SHIELD | Admitting: Family Medicine

## 2015-06-24 VITALS — BP 115/72 | HR 98 | Wt 255.0 lb

## 2015-06-24 DIAGNOSIS — F419 Anxiety disorder, unspecified: Secondary | ICD-10-CM | POA: Diagnosis not present

## 2015-06-24 MED ORDER — VENLAFAXINE HCL ER 75 MG PO CP24: ORAL_CAPSULE | ORAL | Status: DC

## 2015-06-24 NOTE — Progress Notes (Signed)
CC: Barbara Mcmahon is a 39 y.o. female is here for f/u effexor.   Subjective: HPI:  Follow-up anxiety: Continues on Effexor 75 mg daily. She denies any known side effects. She still very happy with the effect this medication is providing her. She no longer let stressful situations interfere with her quality of life or happiness. She denies any depression revealed omental disturbance. She is still not bringing her main stress from work and is no longer irritable towards coworkers or family members. Denies unintentional weight loss, GI disturbance or skin changes. No thoughts of wanting to harm self or others   Review Of Systems Outlined In HPI  Past Medical History  Diagnosis Date  . PCOS (polycystic ovarian syndrome)   . IBS (irritable bowel syndrome)   . Abnormal Pap smear and cervical HPV (human papillomavirus)   . Generalized anxiety disorder   . Seizure disorder     Past Surgical History  Procedure Laterality Date  . Colposcopy    . Gynecologic cryosurgery    . Cholecystectomy    . Dilation and curettage of uterus    . Knee surgery  right   Family History  Problem Relation Age of Onset  . Hypertension Father   . Diabetes Paternal Grandmother   . Hypertension Paternal Grandmother   . Stroke Paternal Grandmother   . Diabetes Paternal Grandfather   . Hypertension Paternal Grandfather   . Stroke Paternal Grandfather   . Hypertension Mother     Social History   Social History  . Marital Status: Married    Spouse Name: N/A  . Number of Children: N/A  . Years of Education: N/A   Occupational History  . Not on file.   Social History Main Topics  . Smoking status: Never Smoker   . Smokeless tobacco: Never Used  . Alcohol Use: No  . Drug Use: No  . Sexual Activity: Not Currently   Other Topics Concern  . Not on file   Social History Narrative     Objective: BP 115/72 mmHg  Pulse 98  Wt 255 lb (115.667 kg)  Vital signs reviewed. General: Alert and  Oriented, No Acute Distress HEENT: Pupils equal, round, reactive to light. Conjunctivae clear.  External ears unremarkable.  Moist mucous membranes. Lungs: Clear and comfortable work of breathing, speaking in full sentences without accessory muscle use. Cardiac: Regular rate and rhythm.  Neuro: CN II-XII grossly intact, gait normal. Extremities: No peripheral edema.  Strong peripheral pulses.  Mental Status: No depression, anxiety, nor agitation. Logical though process. Skin: Warm and dry. Assessment & Plan: Barbara Mcmahon was seen today for f/u effexor..  Diagnoses and all orders for this visit:  Anxiety -     venlafaxine XR (EFFEXOR-XR) 75 MG 24 hr capsule; TAKE 1 CAPSULE (75 MG TOTAL) BY MOUTH DAILY.   Anxiety: Controlled, continue Effexor. Discussed at some point she should consider tapering off of this medication to see if she still really needs it. Joint decision to revisit this in January or early spring after the winter is over.  Return for January follow up to discuss possibly tapering down on Effexor.Marland Kitchen

## 2015-08-08 ENCOUNTER — Encounter: Payer: Self-pay | Admitting: Osteopathic Medicine

## 2015-08-08 ENCOUNTER — Ambulatory Visit (INDEPENDENT_AMBULATORY_CARE_PROVIDER_SITE_OTHER): Payer: BLUE CROSS/BLUE SHIELD | Admitting: Osteopathic Medicine

## 2015-08-08 VITALS — BP 137/83 | HR 102 | Wt 262.0 lb

## 2015-08-08 DIAGNOSIS — J0100 Acute maxillary sinusitis, unspecified: Secondary | ICD-10-CM

## 2015-08-08 DIAGNOSIS — M79601 Pain in right arm: Secondary | ICD-10-CM | POA: Diagnosis not present

## 2015-08-08 MED ORDER — AMOXICILLIN-POT CLAVULANATE 875-125 MG PO TABS: 1.0000 | ORAL_TABLET | Freq: Two times a day (BID) | ORAL | Status: DC

## 2015-08-08 NOTE — Progress Notes (Signed)
HPI: Barbara Mcmahon is a 39 y.o. female who presents to [place]  today for chief complaint of:  Chief Complaint  Patient presents with  . Acute Visit  . Sinusitis    x 2 weeks congestion & ear pain that's felt down deep ear canal  . Wrist Pain     x1 week bump on forearm   SINUSES . Location: sinuses and R ear . Quality: stuffy/congestion . Severity: moderate . Duration: 1 - 2 weeks . Modifying factors :OTC meds not helping . Assoc signs/symptoms: no fever/chills  ARM . Location: R forearm . Quality: bump, tender, muscular . Severity: mild-moderate . Duration: 1 week . Timing: worse with moving the muscle . Context: thinks may be stressing the arm/wrist at work    Past medical, social and family history reviewed: Past Medical History  Diagnosis Date  . PCOS (polycystic ovarian syndrome)   . IBS (irritable bowel syndrome)   . Abnormal Pap smear and cervical HPV (human papillomavirus)   . Generalized anxiety disorder   . Seizure disorder [facility])    Past Surgical History  Procedure Laterality Date  . Colposcopy    . Gynecologic cryosurgery    . Cholecystectomy    . Dilation and curettage of uterus    . Knee surgery  right   Social History  Substance Use Topics  . Smoking status: Never Smoker   . Smokeless tobacco: Never Used  . Alcohol Use: No   Family History  Problem Relation Age of Onset  . Hypertension Father   . Diabetes Paternal Grandmother   . Hypertension Paternal Grandmother   . Stroke Paternal Grandmother   . Diabetes Paternal Grandfather   . Hypertension Paternal Grandfather   . Stroke Paternal Grandfather   . Hypertension Mother     Current Outpatient Prescriptions  Medication Sig Dispense Refill  . cetirizine (ZYRTEC) 10 MG tablet Take 10 mg by mouth daily.    Marland Kitchen desogestrel-ethinyl estradiol (KARIVA,AZURETTE,MIRCETTE) 0.15-0.02/0.01 MG (21/5) tablet Take 1 tablet by mouth daily.    Marland Kitchen EPINEPHrine  (EPI-PEN) 0.3 mg/0.3 mL SOAJ injection Inject 0.3 mLs (0.3 mg total) into the muscle once as needed (anaphylaxis). 1 Device 1  . Multiple Vitamin (DAILY VITAMINS PO) Take by mouth. [place]    . venlafaxine XR (EFFEXOR-XR) 75 MG 24 hr capsule TAKE 1 CAPSULE (75 MG TOTAL) BY MOUTH DAILY. 90 capsule 1   No current facility-administered medications for this visit.   Allergies  Allergen Reactions  . Codeine Hives and Itching    Pt says she can take percocet  . Morphine And Related Other (See Comments)    Big mood changes  . Oxycodone Hives  . Robitussin A-C [Guaifenesin-Codeine] Itching  . Shrimp [Shellfish Allergy] Hives and Swelling    Throat closes      Review of Systems: CONSTITUTIONAL:  No  fever, no chills, No  unintentional weight changes HEAD/EYES/EARS/NOSE/THROAT: (+) sinus headache, no vision change, no hearing change, (+) ear pain as per HPI, No  sore throat CARDIAC: No chest pain, no pressure/palpitations, no orthopnea RESPIRATORY: Yes dry occasional cough, No  shortness of breath/wheeze GASTROINTESTINAL: No nausea, no vomiting, no abdominal pain, no blood in stool, no diarrhea, no constipation MUSCULOSKELETAL: Yes  Myalgia/arthralgia as per HPI SKIN: No rash/wounds/concerning lesions NEUROLOGIC: No weakness, no dizziness, no slurred speech   Exam:  BP 137/83 mmHg  Pulse 102  Wt 262 lb (118.842 kg)  SpO2 97% Constitutional: VSS, see above. General Appearance: alert,  well-developed, well-nourished, NAD Eyes: Normal lids and conjunctive, non-icteric sclera, PERRLA Ears, Nose, Mouth, Throat: Normal external inspection ears/nares/mouth/lips/gums, TM normal bilaterally, MMM, posterior pharynx Yes  erythema No  exudate Neck: No masses, trachea midline. No thyroid enlargement/tenderness/mass appreciated. No lymphadenopathy Respiratory: Normal respiratory effort. no wheeze, no rhonchi, no rales Cardiovascular: S1/S2 normal, no murmur, no rub/gallop auscultated. RRR.   Musculoskeletal: Gait normal. No clubbing/cyanosis of digits. (+) pain with resisted flexion of wrist and resisted supination Neurological: No cranial nerve deficit on limited exam. Motor and sensation intact and symmetric Psychiatric: Normal judgment/insight. Normal mood and affect. Oriented x3.    No results found for this or any previous visit (from the past 72 hour(s)).    ASSESSMENT/PLAN:  Acute maxillary sinusitis, recurrence not specified - Plan: amoxicillin-clavulanate (AUGMENTIN) 875-125 MG tablet  Pain of right upper extremity - forarm flexor group painful/inflamed, no epicondular pain, consider tendonitis, advised NSAID and bracing, RTC for sports med appt/refer to PT if no improvement     No Follow-up on file.

## 2015-08-09 DIAGNOSIS — M79601 Pain in right arm: Secondary | ICD-10-CM | POA: Insufficient documentation

## 2015-10-25 ENCOUNTER — Encounter: Payer: Self-pay | Admitting: Osteopathic Medicine

## 2015-10-25 ENCOUNTER — Ambulatory Visit (INDEPENDENT_AMBULATORY_CARE_PROVIDER_SITE_OTHER): Payer: BLUE CROSS/BLUE SHIELD | Admitting: Osteopathic Medicine

## 2015-10-25 VITALS — BP 124/76 | HR 85 | Temp 97.9°F | Ht 65.0 in | Wt 263.0 lb

## 2015-10-25 DIAGNOSIS — J069 Acute upper respiratory infection, unspecified: Secondary | ICD-10-CM

## 2015-10-25 MED ORDER — BENZONATATE 200 MG PO CAPS: 200.0000 mg | ORAL_CAPSULE | Freq: Three times a day (TID) | ORAL | Status: DC | PRN

## 2015-10-25 MED ORDER — IPRATROPIUM BROMIDE 0.03 % NA SOLN
2.0000 | Freq: Two times a day (BID) | NASAL | Status: DC
Start: 2015-10-25 — End: 2015-12-19

## 2015-10-25 NOTE — Progress Notes (Signed)
HPI: Barbara Mcmahon is a 40 y.o. female who presents to [place]  today for chief complaint of:  Chief Complaint  Patient presents with  . Cough  . Fatigue    . Location: chest, sinuses . Quality: fatigue, coughing, needing a lot more sleep lately, can't breathe out of nose, (+) ear pressure . Severity: marked . Duration: about 6 - 7 days . Modifying factors: has tried the following OTC medications: sudafed  without relief . Assoc signs/symptoms: yes subjective fever/chills, no productive cough, Yes  body aches, No  GI upset, LN swelling   Past medical, social and family history reviewed: Past Medical History  Diagnosis Date  . PCOS (polycystic ovarian syndrome)   . IBS (irritable bowel syndrome)   . Abnormal Pap smear and cervical HPV (human papillomavirus)   . Generalized anxiety disorder   . Seizure disorder [facility])    Past Surgical History  Procedure Laterality Date  . Colposcopy    . Gynecologic cryosurgery    . Cholecystectomy    . Dilation and curettage of uterus    . Knee surgery  right   Social History  Substance Use Topics  . Smoking status: Never Smoker   . Smokeless tobacco: Never Used  . Alcohol Use: No   Family History  Problem Relation Age of Onset  . Hypertension Father   . Diabetes Paternal Grandmother   . Hypertension Paternal Grandmother   . Stroke Paternal Grandmother   . Diabetes Paternal Grandfather   . Hypertension Paternal Grandfather   . Stroke Paternal Grandfather   . Hypertension Mother     Current Outpatient Prescriptions  Medication Sig Dispense Refill  . cetirizine (ZYRTEC) 10 MG tablet Take 10 mg by mouth daily.    Marland Kitchen desogestrel-ethinyl estradiol (KARIVA,AZURETTE,MIRCETTE) 0.15-0.02/0.01 MG (21/5) tablet Take 1 tablet by mouth daily.    Marland Kitchen EPINEPHrine (EPI-PEN) 0.3 mg/0.3 mL SOAJ injection Inject 0.3 mLs (0.3 mg total) into the muscle once as needed (anaphylaxis). 1 Device 1  . Multiple Vitamin  (DAILY VITAMINS PO) Take by mouth. Rainbow [place]    . venlafaxine XR (EFFEXOR-XR) 75 MG 24 hr capsule TAKE 1 CAPSULE (75 MG TOTAL) BY MOUTH DAILY. 90 capsule 1   No current facility-administered medications for this visit.   Allergies  Allergen Reactions  . Codeine Hives and Itching    Pt says she can take percocet  . Morphine And Related Other (See Comments)    Big mood changes  . Oxycodone Hives  . Robitussin A-C [Guaifenesin-Codeine] Itching  . Shrimp [Shellfish Allergy] Hives and Swelling    Throat closes      Review of Systems: CONSTITUTIONAL: no fever/chills HEAD/EYES/EARS/NOSE/THROAT: yes headache, no vision change or hearing change, yes sore throat CARDIAC: No chest pain/pressure/palpitations, no orthopnea RESPIRATORY: yes cough, no shortness of breath GASTROINTESTINAL: no nausea, no vomiting, no abdominal pain/blood in stool/diarrhea/constipation MUSCULOSKELETAL: no myalgia/arthralgia    Exam:  BP 124/76 mmHg  Pulse 85  Temp(Src) 97.9 F (36.6 C) (Oral)  Ht 5\' 5"  (1.651 m)  Wt 263 lb (119.296 kg)  BMI 43.77 kg/m2 Constitutional: VSS, see above. General Appearance: alert, well-developed, well-nourished, NAD Eyes: Normal lids and conjunctive, non-icteric sclera, PERRLA Ears, Nose, Mouth, Throat: Normal external inspection ears/nares/mouth/lips/gums, normal TM, MMM; posterior pharynx without erythema, without exudate Neck: No masses, trachea midline. No thyroid enlargement/tenderness/mass appreciated, normal lymph nodes Respiratory: Normal respiratory effort. No  wheeze/rhonchi/rales Cardiovascular: S1/S2 normal, no murmur/rub/gallop auscultated. RRR. No carotid bruit or JVD. No lower  extremity edema.    ASSESSMENT/PLAN: PT INSTRUCTIONS PRINTED, RTC IF NO BETTER, CALL BY Friday IF NO IMROVEMENT AND AT THAT TIME WOULD CONSIDER AUGMENTIN FOR SINUSITIS  Viral URI - Plan: benzonatate (TESSALON) 200 MG capsule, ipratropium (ATROVENT) 0.03 % nasal spray    Return if  symptoms worsen or fail to improve.

## 2015-10-25 NOTE — Patient Instructions (Addendum)
CALL us IF YOU ARE NO BETTER BY FRIDAY SEE BELOW FOR OVER-THE-COUNTER TREATMENTS FOR COLD/SINUS SYMPTOMS.    DR. Redgie Grayer HOME CARE INSTRUCTIONS: UPPER RESPIRATORY ILLNESS AND SINUSITIS   FRIST, A FEW NOTES ON OVER-THE-COUNTER MEDICATIONS!  . USE CAUTION - MANY OVER-THE-COUNTER MEDICATIONS COME IN COMBINATIONS OF MULTIPLE GENERICS. FOR INSTANCE, NYQUIL HAS TYLENOL + COUGH MEDICINE + AN ANTIHISTAMINE, SO BE CAREFUL YOU'RE NOT TAKING A COMBINATION MEDICINE WHICH CONTAINS MEDICATIONS YOU'RE ALSO TAKING SEPARATELY (LIKE NYQUIL SYRUP AS WELL AS TYLENOL PILLS).  Marland Kitchen YOUR PHARMACIST CAN HELP YOU AVOID MEDICATION INTERACTIONS AND DUPLICATIONS - ASK FOR THEIR HELP IF YOU ARE CONFUSED OR UNSURE ABOUT WHAT TO PURCHASE OVER-THE-COUNTER!  . REMEMBER - IF YOU'RE EVER CONCERNED ABOUT MEDICATION SIDE EFFECTS, OR IF YOU'RE EVER CONCERNED YOUR SYMPTOMS ARE GETTING WORSE DESPITE TREATMENT, PLEASE CALL THE OFFICE!   TREAT SINUS CONGESTION, RUNNY NOSE & POSTNASAL DRIP: . Treat to increase airflow through sinuses, decrease congestion pain and prevent bacterial growth!  . Remember, only 0.5-2% of sinus infections are due to a bacteria, the rest are due to a virus (usually the common cold)! Trust your doctor when he or she decides whether or not you really need an antibiotic!   NASAL SPRAYS: generally safe and should not interact with other medicines. Can take any of these medications, either alone or together... FLONASE (FLUTICASONE) - 2 sprays in each nostril twice per day (also a great allergy medicine to use long-term!) AFRIN (OXYMETOLAZONE) - use sparingly because it will cause rebound congestion, NEVER USE IN KIDS   SALINE NASAL SPRAY- no limit, but avoid use after other nasal sprays or it can wash the medicine away  ANTIHISTAMINES: Helps dry out runny nose and decreases postnasal drip. Benadryl may cause drowsiness but other preparations should not be as sedating. Certain kinds are not as safe in elderly  individuals. OK to use unless Dr A says otherwise.  Only use one of the following... BENADRYL (DIPHENHYDRAMINE) - 25-50 mg every 6 hours ZYRTEC (CETIRIZINE) - 5-10 mg daily CLARITIN (LORATIDINE) - less potent. 10 mg daily ALLEGRA (FEXOFENADINE) - least likely to cause drowsiness! 180 mg daily or 60 mg twice per day  DECONGESTANTS: Helps dry out runny nose and helps with sinus pain. May cause insomnia, or sometimes elevated heart rate. Can cause problems if used often in people with high blood pressure. OK to use unless Dr A says otherwise. NEVER USE IN KIDS UNDER 31 YEARS OLD. Only use one of the following... SUDAFED (PSEUDOEPHEDINE) - 60 mg every 4 - 6 hours, also comes in 120 mg extended release every 12 hours, maximum 240 mg in 24 hours.  SUDAED PE (PHENYLEPHRINE) - 10 mg every 4 - 6 hours, maximum 60 mg per day  COMBINATIONS OF ANTIHISTAMINE + DECONGESTANT: these usually require you to show your ID at the pharmacy counter. You can also purchase these medicines separately as noted above.  Only use one of the following... ZYRTEC-D (CETIRIZINE + PSEUDOEPHEDRINE) - 12 hour formulation as directed CLARITIN-D (LORATIDINE + PSEUDOEPHEDRINE) - 12 and 24 hour formulations as directed ALLEGRA-D (FEXOFENADINE + PSEUDOEPHEDRINE) - 12 and 24 hour formulations as directed  PRESCRIPTION TREATMENT FOR SINUS PROBLEMS: Can include nasal sprays, pills, or antibiotics in the case of true bacterial infection. Not everyone needs an antibiotic but there are other medicines which will help you feel better while your body fights the infection!   TREAT COUGH & SORE THROAT: . Remember, cough is the body's way of protecting your airways and  lungs, it's a hard-wired reflex that is tough for medicines to treat!  . Irritation to the airways will cause cough. This irritation is usually caused by upper airway problems like postnasal drip (treat as above) and viral sore throat, but in severe cases can be due to lower airway  problems like bronchitis or pneumonia, which a doctor can usually hear on exam of your lungs or an X-ray. . Sore throat is almost always due to a virus, but occasionally caused by Strep, which requires antibiotics.  . Exercise and smoking may make cough worse - take it easy while you're sick, and QUIT SMOKING!   . Cough due to viral infection can linger for 2 weeks or so. If you're coughing longer than you think you should, or if the cough is severe, please make an appointment in the office - you may need a chest X-ray.   EXPECTORANT: Used to help clear airways, take these with PLENTY of water to help thin mucus secretions and make the mucus easier to cough up   Only use one of the following... ROBITUSSIN (DEXTROMETHORPHAN OR GUAIFENISEN depending on formulation)  MUCINEX (GUAIFENICEN) - usually longer acting  COUGH DROPS/LOZENGES: Whichever over-the-counter agent you prefer!  Here are some suggestions for ingredients to look for (can take both)... BENZOCAINE - numbing effect, also in [place] - cooling effect  HONEY: has gone head-to-head in several clinical trials with prescription cough medicines and found to be equally effective! Try 1 Teaspoon Honey + 2 Drops Lemon Juice, as much as you want to use. NONE FOR KIDS UNDER AGE 110  HERBAL TEA: There are certain ingredients which help "coat the throat" to relieve pain  such as ELM BARK, LICORICE ROOT, MARSHMALLOW ROOT  PRESCRIPTION TREATMENT FOR COUGH: Reserved for severe cases. Can include pills, syrups or inhalers.    TREAT ACHES & PAINS, FEVER: . Illness causes aches, pains and fever as your body increases its natural inflammation response to help fight the infection.  . Rest, good hydration and nutrition, and taking anti-inflammatory medications will help.  . Remember: a true fever is a temperature 100.4 or higher. If you have a fever that is 105.0 or higher, that is a dangerous level and needs medical attention  in the office or in the ER!    Can take both of these together... IBUPROFEN - 400-800 mg every 6 - 8 hours. Ibuprofen and similar medications can cause problems for people with heart disease or history of stomach ulcers, check with Dr A first if you're concerned. Lower doses are usually safe and effective.  TYLENOL (ACETAMINOPHEN) - [phone] mg every 6 hours. It won't cause problems with heart or stomach.     REMEMBER - THE MOST IMPORTANT THINGS YOU CAN DO TO AVOID CATCHING OR SPREADING ILLNESS INCLUDE:  . COVER YOUR COUGH WITH YOUR ARM, NOT WITH YOUR HANDS!  . DISINFECT COMMONLY USED SURFACES (SUCH AS TELEPHONES & DOORKNOBS) WHEN YOU OR SOMEONE CLOSE TO YOU IS FEELING SICK!  . BE SURE VACCINES ARE UP TO DATE - GET A FLU SHOT EVERY YEAR! . GOOD NUTRITION AND HEALTHY LIFESTYLE WILL HELP YOUR IMMUNE SYSTEM YEAR-ROUND! . AND ABOVE ALL - [place]!

## 2015-12-19 ENCOUNTER — Encounter: Payer: Self-pay | Admitting: Family Medicine

## 2015-12-19 ENCOUNTER — Ambulatory Visit (INDEPENDENT_AMBULATORY_CARE_PROVIDER_SITE_OTHER): Payer: BLUE CROSS/BLUE SHIELD | Admitting: Family Medicine

## 2015-12-19 VITALS — BP 123/82 | HR 90 | Temp 98.4°F | Wt 257.0 lb

## 2015-12-19 DIAGNOSIS — F419 Anxiety disorder, unspecified: Secondary | ICD-10-CM | POA: Diagnosis not present

## 2015-12-19 DIAGNOSIS — J0100 Acute maxillary sinusitis, unspecified: Secondary | ICD-10-CM | POA: Diagnosis not present

## 2015-12-19 MED ORDER — VENLAFAXINE HCL ER 75 MG PO CP24: ORAL_CAPSULE | ORAL | Status: DC

## 2015-12-19 MED ORDER — DOXYCYCLINE HYCLATE 100 MG PO TABS: ORAL_TABLET | ORAL | Status: AC

## 2015-12-19 NOTE — Progress Notes (Signed)
CC: Barbara Mcmahon is a 40 y.o. female is here for Sinusitis   Subjective: HPI:  Facial pressure in the left forehead and left cheek. Accompanied by nasal congestion, postnasal drip and mild sore throat. Symptoms were originally moderate in severity however over the past week and a half majority of symptoms have subsided except for persistent facial pressure to a moderate degree. Nothing seems to make it better or worse. No benefit from over-the-counter cough and cold medications. Denies fevers, chills, shortness of breath, cough or chest discomfort.  We had talked about stopping Effexor sometime in the spring, she is overwhelmed at work with job responsibilities and Has no desire to stop this in the near future. She denies any artificial anxiety or depression.    Review Of Systems Outlined In HPI  Past Medical History  Diagnosis Date  . PCOS (polycystic ovarian syndrome)   . IBS (irritable bowel syndrome)   . Abnormal Pap smear and cervical HPV (human papillomavirus)   . Generalized anxiety disorder   . Seizure disorder [facility])     Past Surgical History  Procedure Laterality Date  . Colposcopy    . Gynecologic cryosurgery    . Cholecystectomy    . Dilation and curettage of uterus    . Knee surgery  right   Family History  Problem Relation Age of Onset  . Hypertension Father   . Diabetes Paternal Grandmother   . Hypertension Paternal Grandmother   . Stroke Paternal Grandmother   . Diabetes Paternal Grandfather   . Hypertension Paternal Grandfather   . Stroke Paternal Grandfather   . Hypertension Mother     Social History   Social History  . Marital Status: Married    Spouse Name: N/A  . Number of Children: N/A  . Years of Education: N/A   Occupational History  . Not on file.   Social History Main Topics  . Smoking status: Never Smoker   . Smokeless tobacco: Never Used  . Alcohol Use: No  . Drug Use: No  . Sexual Activity: Not Currently   Other Topics Concern   . Not on file   Social History Narrative     Objective: BP 123/82 mmHg  Pulse 90  Temp(Src) 98.4 F (36.9 C) (Oral)  Wt 257 lb (116.574 kg)  General: Alert and Oriented, No Acute Distress HEENT: Pupils equal, round, reactive to light. Conjunctivae clear.  External ears unremarkable, canals clear with intact TMs with appropriate landmarks.  Middle ear appears open without effusion. Pink inferior turbinates.  Moist mucous membranes, pharynx without inflammation nor lesions.  Neck supple without palpable lymphadenopathy nor abnormal masses. Lungs:Clear and comfortable work of breathing  Cardiac: Regular rate and rhythm.   Extremities: No peripheral edema.  Strong peripheral pulses.  Mental Status: No depression, anxiety, nor agitation. Skin: Warm and dry.  Assessment & Plan: Barbara Mcmahon was seen today for sinusitis.  Diagnoses and all orders for this visit:  Acute maxillary sinusitis, recurrence not specified -     doxycycline (VIBRA-TABS) 100 MG tablet; One by mouth twice a day for ten days.  Anxiety -     venlafaxine XR (EFFEXOR-XR) 75 MG 24 hr capsule; TAKE 1 CAPSULE (75 MG TOTAL) BY MOUTH DAILY.  Maxillary sinusitis: Start doxycycline consider nasal saline washes. Call if no better by Wednesday  anxiety: Controlled with Effexor we will readdress possibly stopping this later in the spring or early summer.    No Follow-up on file.

## 2016-01-11 DIAGNOSIS — J029 Acute pharyngitis, unspecified: Secondary | ICD-10-CM | POA: Diagnosis not present

## 2016-02-02 DIAGNOSIS — E282 Polycystic ovarian syndrome: Secondary | ICD-10-CM | POA: Diagnosis not present

## 2016-02-02 DIAGNOSIS — Z13220 Encounter for screening for lipoid disorders: Secondary | ICD-10-CM | POA: Diagnosis not present

## 2016-02-02 DIAGNOSIS — Z6841 Body Mass Index (BMI) 40.0 and over, adult: Secondary | ICD-10-CM | POA: Diagnosis not present

## 2016-02-02 LAB — LIPID PANEL
Cholesterol: 165 mg/dL (ref 0–200)
HDL: 54 mg/dL (ref 35–70)
LDL CALC: 88 mg/dL
TRIGLYCERIDES: 115 mg/dL (ref 40–160)

## 2016-02-14 DIAGNOSIS — Z6841 Body Mass Index (BMI) 40.0 and over, adult: Secondary | ICD-10-CM | POA: Diagnosis not present

## 2016-02-14 DIAGNOSIS — Z01419 Encounter for gynecological examination (general) (routine) without abnormal findings: Secondary | ICD-10-CM | POA: Diagnosis not present

## 2016-02-14 DIAGNOSIS — Z1212 Encounter for screening for malignant neoplasm of rectum: Secondary | ICD-10-CM | POA: Diagnosis not present

## 2016-02-14 DIAGNOSIS — Z1231 Encounter for screening mammogram for malignant neoplasm of breast: Secondary | ICD-10-CM | POA: Diagnosis not present

## 2016-03-09 DIAGNOSIS — Z6841 Body Mass Index (BMI) 40.0 and over, adult: Secondary | ICD-10-CM | POA: Diagnosis not present

## 2016-05-29 ENCOUNTER — Ambulatory Visit (INDEPENDENT_AMBULATORY_CARE_PROVIDER_SITE_OTHER): Payer: BLUE CROSS/BLUE SHIELD | Admitting: Family Medicine

## 2016-05-29 ENCOUNTER — Encounter: Payer: Self-pay | Admitting: Family Medicine

## 2016-05-29 VITALS — BP 125/79 | HR 93 | Temp 98.2°F | Wt 271.0 lb

## 2016-05-29 DIAGNOSIS — J329 Chronic sinusitis, unspecified: Secondary | ICD-10-CM

## 2016-05-29 DIAGNOSIS — A499 Bacterial infection, unspecified: Secondary | ICD-10-CM | POA: Diagnosis not present

## 2016-05-29 DIAGNOSIS — B9689 Other specified bacterial agents as the cause of diseases classified elsewhere: Secondary | ICD-10-CM

## 2016-05-29 MED ORDER — LEVOFLOXACIN 500 MG PO TABS: 500.0000 mg | ORAL_TABLET | Freq: Every day | ORAL | 0 refills | Status: DC

## 2016-05-29 NOTE — Progress Notes (Signed)
CC: Barbara Mcmahon is a 40 y.o. female is here for Sinusitis   Subjective: HPI:  For the past month she's had pressure in discomfort in her cheeks and just above the eyebrows. It's worse when going up or down in an airplane. It is also worse when lying down. She's had nasal congestion and postnasal drip that has not responded to amoxicillin or Flonase. She denies fevers or chills nor any cough. She gets this collection of symptoms for 5 times a year. Symptoms have been slowly worsening over the past week.   Review Of Systems Outlined In HPI  Past Medical History:  Diagnosis Date  . Abnormal Pap smear and cervical HPV (human papillomavirus)   . Generalized anxiety disorder   . IBS (irritable bowel syndrome)   . PCOS (polycystic ovarian syndrome)   . Seizure disorder [facility])     Past Surgical History:  Procedure Laterality Date  . CHOLECYSTECTOMY    . COLPOSCOPY    . DILATION AND CURETTAGE OF UTERUS    . GYNECOLOGIC CRYOSURGERY    . KNEE SURGERY  right   Family History  Problem Relation Age of Onset  . Hypertension Father   . Diabetes Paternal Grandmother   . Hypertension Paternal Grandmother   . Stroke Paternal Grandmother   . Diabetes Paternal Grandfather   . Hypertension Paternal Grandfather   . Stroke Paternal Grandfather   . Hypertension Mother     Social History   Social History  . Marital status: Married    Spouse name: N/A  . Number of children: N/A  . Years of education: N/A   Occupational History  . Not on file.   Social History Main Topics  . Smoking status: Never Smoker  . Smokeless tobacco: Never Used  . Alcohol use No  . Drug use: No  . Sexual activity: Not Currently   Other Topics Concern  . Not on file   Social History Narrative  . No narrative on file     Objective: BP 125/79   Pulse 93   Temp 98.2 F (36.8 C) (Oral)   Wt 271 lb (122.9 kg)   BMI 45.10 kg/m   General: Alert and Oriented, No Acute Distress HEENT: Pupils equal,  round, reactive to light. Conjunctivae clear.  External ears unremarkable, canals clear with intact TMs with appropriate landmarks.  Middle ear appears open without effusion. Pink inferior turbinates.  Moist mucous membranes, pharynx without inflammation nor lesions.  Neck supple without palpable lymphadenopathy nor abnormal masses. Lungs: Clear comfortable work of breathing Cardiac: Regular rate and rhythm.  Extremities: No peripheral edema.  Strong peripheral pulses.  Mental Status: No depression, anxiety, nor agitation. Skin: Warm and dry.  Assessment & Plan: [name] was seen today for sinusitis.  Diagnoses and all orders for this visit:  Bacterial sinusitis -     levofloxacin (LEVAQUIN) 500 MG tablet; Take 1 tablet (500 mg total) by mouth daily. -     Ambulatory referral to ENT  Start levofloxacin continue Flonase consider nasal saline washes and referral to ENT given the frequency of her infections.  Discussed with this patient that I will be resigning from my position here with [facility] in September in order to stay with my family who will be moving to [facility]. I let him know about the providers that are still accepting patients and I feel that this individual will be under great care if he/she stays here with [facility].    No Follow-up on file.

## 2016-06-05 ENCOUNTER — Telehealth: Payer: Self-pay

## 2016-06-06 MED ORDER — PREDNISONE 20 MG PO TABS: ORAL_TABLET | ORAL | 0 refills | Status: AC

## 2016-06-06 NOTE — Telephone Encounter (Signed)
Pt.notified

## 2016-06-06 NOTE — Telephone Encounter (Signed)
Prednisone taper regimen sent to [facility]

## 2016-06-20 ENCOUNTER — Other Ambulatory Visit: Payer: Self-pay | Admitting: Family Medicine

## 2016-06-20 DIAGNOSIS — F419 Anxiety disorder, unspecified: Secondary | ICD-10-CM

## 2016-07-05 ENCOUNTER — Other Ambulatory Visit: Payer: Self-pay | Admitting: Family Medicine

## 2016-07-05 DIAGNOSIS — F419 Anxiety disorder, unspecified: Secondary | ICD-10-CM

## 2016-07-11 DIAGNOSIS — L309 Dermatitis, unspecified: Secondary | ICD-10-CM | POA: Diagnosis not present

## 2016-07-11 DIAGNOSIS — D2230 Melanocytic nevi of unspecified part of face: Secondary | ICD-10-CM | POA: Diagnosis not present

## 2016-07-11 DIAGNOSIS — Z1283 Encounter for screening for malignant neoplasm of skin: Secondary | ICD-10-CM | POA: Diagnosis not present

## 2016-07-11 DIAGNOSIS — D235 Other benign neoplasm of skin of trunk: Secondary | ICD-10-CM | POA: Diagnosis not present

## 2016-07-19 DIAGNOSIS — J320 Chronic maxillary sinusitis: Secondary | ICD-10-CM | POA: Diagnosis not present

## 2016-07-19 DIAGNOSIS — J3489 Other specified disorders of nose and nasal sinuses: Secondary | ICD-10-CM | POA: Diagnosis not present

## 2016-07-19 DIAGNOSIS — J301 Allergic rhinitis due to pollen: Secondary | ICD-10-CM | POA: Diagnosis not present

## 2016-07-19 DIAGNOSIS — J321 Chronic frontal sinusitis: Secondary | ICD-10-CM | POA: Diagnosis not present

## 2016-07-20 DIAGNOSIS — J329 Chronic sinusitis, unspecified: Secondary | ICD-10-CM | POA: Diagnosis not present

## 2016-08-01 DIAGNOSIS — R05 Cough: Secondary | ICD-10-CM | POA: Diagnosis not present

## 2016-08-01 DIAGNOSIS — J301 Allergic rhinitis due to pollen: Secondary | ICD-10-CM | POA: Diagnosis not present

## 2016-08-13 ENCOUNTER — Other Ambulatory Visit: Payer: Self-pay | Admitting: Osteopathic Medicine

## 2016-08-13 DIAGNOSIS — F419 Anxiety disorder, unspecified: Secondary | ICD-10-CM

## 2016-08-14 ENCOUNTER — Encounter: Payer: Self-pay | Admitting: Family Medicine

## 2016-08-14 ENCOUNTER — Ambulatory Visit (INDEPENDENT_AMBULATORY_CARE_PROVIDER_SITE_OTHER): Payer: BLUE CROSS/BLUE SHIELD | Admitting: Family Medicine

## 2016-08-14 ENCOUNTER — Ambulatory Visit (INDEPENDENT_AMBULATORY_CARE_PROVIDER_SITE_OTHER): Payer: BLUE CROSS/BLUE SHIELD

## 2016-08-14 VITALS — BP 123/88 | HR 99 | Temp 98.4°F | Wt 276.0 lb

## 2016-08-14 DIAGNOSIS — R0989 Other specified symptoms and signs involving the circulatory and respiratory systems: Secondary | ICD-10-CM

## 2016-08-14 DIAGNOSIS — J209 Acute bronchitis, unspecified: Secondary | ICD-10-CM

## 2016-08-14 DIAGNOSIS — R05 Cough: Secondary | ICD-10-CM | POA: Diagnosis not present

## 2016-08-14 MED ORDER — TRAMADOL HCL 50 MG PO TABS: 50.0000 mg | ORAL_TABLET | Freq: Every evening | ORAL | 0 refills | Status: DC | PRN

## 2016-08-14 MED ORDER — BENZONATATE 200 MG PO CAPS: 200.0000 mg | ORAL_CAPSULE | Freq: Three times a day (TID) | ORAL | 0 refills | Status: DC | PRN

## 2016-08-14 NOTE — Patient Instructions (Signed)
Thank you for coming in today. Get an xray today.  Use tessalon for cough.  Use tramadol for severe cough as needed.  Do not drive after taking tramadol.  Call or go to the emergency room if you get worse, have trouble breathing, have chest pains, or palpitations.    Acute Bronchitis Bronchitis is inflammation of the airways that extend from the windpipe into the lungs (bronchi). The inflammation often causes mucus to develop. This leads to a cough, which is the most common symptom of bronchitis.  In acute bronchitis, the condition usually develops suddenly and goes away over time, usually in a couple weeks. Smoking, allergies, and asthma can make bronchitis worse. Repeated episodes of bronchitis may cause further lung problems.  CAUSES Acute bronchitis is most often caused by the same virus that causes a cold. The virus can spread from person to person (contagious) through coughing, sneezing, and touching contaminated objects. SIGNS AND SYMPTOMS   Cough.   Fever.   Coughing up mucus.   Body aches.   Chest congestion.   Chills.   Shortness of breath.   Sore throat.  DIAGNOSIS  Acute bronchitis is usually diagnosed through a physical exam. Your health care provider will also ask you questions about your medical history. Tests, such as chest X-rays, are sometimes done to rule out other conditions.  TREATMENT  Acute bronchitis usually goes away in a couple weeks. Oftentimes, no medical treatment is necessary. Medicines are sometimes given for relief of fever or cough. Antibiotic medicines are usually not needed but may be prescribed in certain situations. In some cases, an inhaler may be recommended to help reduce shortness of breath and control the cough. A cool mist vaporizer may also be used to help thin bronchial secretions and make it easier to clear the chest.  HOME CARE INSTRUCTIONS  Get plenty of rest.   Drink enough fluids to keep your urine clear or pale yellow  (unless you have a medical condition that requires fluid restriction). Increasing fluids may help thin your respiratory secretions (sputum) and reduce chest congestion, and it will prevent dehydration.   Take medicines only as directed by your health care provider.  If you were prescribed an antibiotic medicine, finish it all even if you start to feel better.  Avoid smoking and secondhand smoke. Exposure to cigarette smoke or irritating chemicals will make bronchitis worse. If you are a smoker, consider using nicotine gum or skin patches to help control withdrawal symptoms. Quitting smoking will help your lungs heal faster.   Reduce the chances of another bout of acute bronchitis by washing your hands frequently, avoiding people with cold symptoms, and trying not to touch your hands to your mouth, nose, or eyes.   Keep all follow-up visits as directed by your health care provider.  SEEK MEDICAL CARE IF: Your symptoms do not improve after 1 week of treatment.  SEEK IMMEDIATE MEDICAL CARE IF:  You develop an increased fever or chills.   You have chest pain.   You have severe shortness of breath.  You have bloody sputum.   You develop dehydration.  You faint or repeatedly feel like you are going to pass out.  You develop repeated vomiting.  You develop a severe headache. MAKE SURE YOU:   Understand these instructions.  Will watch your condition.  Will get help right away if you are not doing well or get worse.   This information is not intended to replace advice given to you by your  health care provider. Make sure you discuss any questions you have with your health care provider.   Document Released: 11/01/2004 Document Revised: 10/15/2014 Document Reviewed: 03/17/2013 Elsevier Interactive Patient Education [employer].

## 2016-08-14 NOTE — Progress Notes (Signed)
Barbara Mcmahon is a 40 y.o. female who presents to [place] [place] today for cough and congestion. Symptoms present for 5 days. Patient has tried some over-the-counter medications. She has positive sick contacts at work. No fevers or chills. No significant shortness of breath or wheezing. Cough is productive. Patient notes an allergy to most opiates however she can take tramadol. Patient notes a rattling sensation in the right lung field when she lays down. She's concerned she may have pneumonia.   Past Medical History:  Diagnosis Date  . Abnormal Pap smear and cervical HPV (human papillomavirus)   . Generalized anxiety disorder   . IBS (irritable bowel syndrome)   . PCOS (polycystic ovarian syndrome)   . Seizure disorder [facility])    Past Surgical History:  Procedure Laterality Date  . CHOLECYSTECTOMY    . COLPOSCOPY    . DILATION AND CURETTAGE OF UTERUS    . GYNECOLOGIC CRYOSURGERY    . KNEE SURGERY  right   Social History  Substance Use Topics  . Smoking status: Never Smoker  . Smokeless tobacco: Never Used  . Alcohol use No   family history includes Diabetes in her paternal grandfather and paternal grandmother; Hypertension in her father, mother, paternal grandfather, and paternal grandmother; Stroke in her paternal grandfather and paternal grandmother.  ROS as above:  Medications: Current Outpatient Prescriptions  Medication Sig Dispense Refill  . cetirizine (ZYRTEC) 10 MG tablet Take 10 mg by mouth daily.    Marland Kitchen desogestrel-ethinyl estradiol (KARIVA,AZURETTE,MIRCETTE) 0.15-0.02/0.01 MG (21/5) tablet Take 1 tablet by mouth daily.    Marland Kitchen EPINEPHrine (EPI-PEN) 0.3 mg/0.3 mL SOAJ injection Inject 0.3 mLs (0.3 mg total) into the muscle once as needed (anaphylaxis). 1 Device 1  . Multiple Vitamin (DAILY VITAMINS PO) Take by mouth. Rainbow Light    . venlafaxine XR  (EFFEXOR-XR) 75 MG 24 hr capsule Take 1 capsule (75 mg total) by mouth daily with breakfast. 30 capsule 0  . benzonatate (TESSALON) 200 MG capsule Take 1 capsule (200 mg total) by mouth 3 (three) times daily as needed for cough. 45 capsule 0  . Cholecalciferol (VITAMIN D3) 1000 units CAPS Take by mouth.    . traMADol (ULTRAM) 50 MG tablet Take 1 tablet (50 mg total) by mouth at bedtime as needed (cough). 10 tablet 0   No current facility-administered medications for this visit.    Allergies  Allergen Reactions  . Codeine Hives and Itching    Pt says she can take percocet  . Morphine And Related Other (See Comments)    Big mood changes  . Oxycodone Hives  . Robitussin A-C [Guaifenesin-Codeine] Itching  . Shrimp [Shellfish Allergy] Hives and Swelling    Throat closes    Health Maintenance Health Maintenance  Topic Date Due  . INFLUENZA VACCINE  05/08/2016  . PAP SMEAR  08/27/2017  . TETANUS/TDAP  01/12/2020  . HIV Screening  Completed     Exam:  BP 123/88   Pulse 99   Temp 98.4 F (36.9 C) (Oral)   Wt 276 lb (125.2 kg)   SpO2 98%   BMI 45.93 kg/m  Gen: Well NAD Nontoxic appearing HEENT: EOMI,  MMM normal posterior pharynx Lungs: Normal work of breathing. CTABL Heart: RRR no MRG Abd: NABS, Soft. Nondistended, Nontender Exts: Brisk capillary refill, warm and well perfused.    No results found for this or any previous visit (from the past 72 hour(s)). No results found.  Assessment and Plan: 40 y.o. female with bronchitis. Obtain chest x-ray and treat symptomatically with Tessalon and tramadol for cough. We'll obtain an x-ray as patient has a rattling feeling and sensation and she says her symptoms are somewhat consistent with previous episodes of pneumonia. Return in the near future if not improved.   Orders Placed This Encounter  Procedures  . DG Chest 2 View    Order Specific Question:   Reason for exam:    Answer:   Cough, assess intra-thoracic pathology     Order Specific Question:   Is the patient pregnant?    Answer:   No    Order Specific Question:   Preferred imaging location?    Answer:   [facility]    Discussed warning signs or symptoms. Please see discharge instructions. Patient expresses understanding.

## 2016-08-17 ENCOUNTER — Encounter: Payer: Self-pay | Admitting: Family Medicine

## 2016-08-20 MED ORDER — AZITHROMYCIN 250 MG PO TABS: 250.0000 mg | ORAL_TABLET | Freq: Every day | ORAL | 0 refills | Status: DC

## 2016-08-21 DIAGNOSIS — J301 Allergic rhinitis due to pollen: Secondary | ICD-10-CM | POA: Diagnosis not present

## 2016-08-29 DIAGNOSIS — J301 Allergic rhinitis due to pollen: Secondary | ICD-10-CM | POA: Diagnosis not present

## 2016-09-05 DIAGNOSIS — J301 Allergic rhinitis due to pollen: Secondary | ICD-10-CM | POA: Diagnosis not present

## 2016-09-11 DIAGNOSIS — J301 Allergic rhinitis due to pollen: Secondary | ICD-10-CM | POA: Diagnosis not present

## 2016-09-12 ENCOUNTER — Other Ambulatory Visit: Payer: Self-pay | Admitting: *Deleted

## 2016-09-14 ENCOUNTER — Telehealth: Payer: Self-pay | Admitting: Family Medicine

## 2016-09-14 NOTE — Telephone Encounter (Signed)
Patient stated that Dr.Hommel told her that you would be her Primary Care doctor after he left. I have her put down to establish with you this month. Is that okay with you? Or do I need to reschedule?  Thanks

## 2016-09-17 ENCOUNTER — Encounter: Payer: Self-pay | Admitting: Family Medicine

## 2016-09-17 DIAGNOSIS — F419 Anxiety disorder, unspecified: Secondary | ICD-10-CM

## 2016-09-17 MED ORDER — VENLAFAXINE HCL ER 75 MG PO CP24: 75.0000 mg | ORAL_CAPSULE | Freq: Every day | ORAL | 0 refills | Status: DC

## 2016-09-17 NOTE — Telephone Encounter (Signed)
That is fine. If you can also just let her know that we do have a new provider as well that might be more easy to access when she's not feeling well but otherwise I am happy to see her.

## 2016-09-18 DIAGNOSIS — J301 Allergic rhinitis due to pollen: Secondary | ICD-10-CM | POA: Diagnosis not present

## 2016-09-24 ENCOUNTER — Ambulatory Visit (INDEPENDENT_AMBULATORY_CARE_PROVIDER_SITE_OTHER): Payer: BLUE CROSS/BLUE SHIELD | Admitting: Family Medicine

## 2016-09-24 ENCOUNTER — Encounter: Payer: Self-pay | Admitting: Family Medicine

## 2016-09-24 VITALS — BP 135/71 | HR 75 | Wt 277.0 lb

## 2016-09-24 DIAGNOSIS — F419 Anxiety disorder, unspecified: Secondary | ICD-10-CM

## 2016-09-24 DIAGNOSIS — Z91013 Allergy to seafood: Secondary | ICD-10-CM | POA: Insufficient documentation

## 2016-09-24 DIAGNOSIS — Z9109 Other allergy status, other than to drugs and biological substances: Secondary | ICD-10-CM | POA: Diagnosis not present

## 2016-09-24 MED ORDER — VENLAFAXINE HCL ER 75 MG PO CP24: 75.0000 mg | ORAL_CAPSULE | Freq: Every day | ORAL | 2 refills | Status: DC

## 2016-09-24 NOTE — Addendum Note (Signed)
Addended by: Huel Cote on: 09/24/2016 02:53 PM   Modules accepted: Orders

## 2016-09-24 NOTE — Progress Notes (Addendum)
Subjective:    CC: anxiety  HPI:  GAD -  She's currently on venlafaxine and doing well.  She says she is a bit of a perfectionist of the sometimes heightens her anxiety and that's why she takes the medication. She says it does help and she's not really experienced any side effects. Currently on 75 mg daily.  Obesity-she was followed for quite some time with Dr. Renaee Munda at the bariatric clinic. She tried multiple medications but unfortunately was not successful. She is looking at doing a balloon type procedure in the stomach with her gastroenterologist as an option.  She currently receives immunotherapy for allergies with Dr. Caprice Kluver at [facility] nose [facility].  He also reports that she recently found out that she was allergic to yeast products. She says it has made a huge difference in her frequent sinus infections since avoiding this.  BP 135/71   Pulse 75   Wt 277 lb (125.6 kg)   BMI 46.10 kg/m     Allergies  Allergen Reactions  . Codeine Hives and Itching    Pt says she can take percocet  . Morphine And Related Other (See Comments)    Big mood changes  . Oxycodone Hives  . Robitussin A-C [Guaifenesin-Codeine] Itching  . Shrimp [Shellfish Allergy] Hives and Swelling    Throat closes  . Yeast-Related Products Hives    Past Medical History:  Diagnosis Date  . Abnormal Pap smear and cervical HPV (human papillomavirus)   . Generalized anxiety disorder   . IBS (irritable bowel syndrome)   . PCOS (polycystic ovarian syndrome)   . Seizure disorder [facility])     Past Surgical History:  Procedure Laterality Date  . CHOLECYSTECTOMY    . COLPOSCOPY    . DILATION AND CURETTAGE OF UTERUS    . GYNECOLOGIC CRYOSURGERY    . KNEE SURGERY  right    Social History   Social History  . Marital status: Married    Spouse name: N/A  . Number of children: N/A  . Years of education: N/A   Occupational History  . Not on file.   Social History Main Topics  . Smoking  status: Never Smoker  . Smokeless tobacco: Never Used  . Alcohol use No  . Drug use: No  . Sexual activity: Not Currently   Other Topics Concern  . Not on file   Social History Narrative  . No narrative on file    Family History  Problem Relation Age of Onset  . Hypertension Father   . Diabetes Paternal Grandmother   . Hypertension Paternal Grandmother   . Stroke Paternal Grandmother   . Diabetes Paternal Grandfather   . Hypertension Paternal Grandfather   . Stroke Paternal Grandfather   . Hypertension Mother     Outpatient Encounter Prescriptions as of 09/24/2016  Medication Sig  . cetirizine (ZYRTEC) 10 MG tablet Take 10 mg by mouth daily.  Marland Kitchen desogestrel-ethinyl estradiol (KARIVA,AZURETTE,MIRCETTE) 0.15-0.02/0.01 MG (21/5) tablet Take 1 tablet by mouth daily.  Marland Kitchen EPINEPHrine (EPI-PEN) 0.3 mg/0.3 mL SOAJ injection Inject 0.3 mLs (0.3 mg total) into the muscle once as needed (anaphylaxis).  Marland Kitchen venlafaxine XR (EFFEXOR-XR) 75 MG 24 hr capsule Take 1 capsule (75 mg total) by mouth daily with breakfast. Must keep appt  . [DISCONTINUED] venlafaxine XR (EFFEXOR-XR) 75 MG 24 hr capsule Take 1 capsule (75 mg total) by mouth daily with breakfast. Must keep appt  . [DISCONTINUED] Cholecalciferol (VITAMIN D3) 1000 units CAPS Take by mouth.  . [  DISCONTINUED] Multiple Vitamin (DAILY VITAMINS PO) Take by mouth. Rainbow Light  . [DISCONTINUED] traMADol (ULTRAM) 50 MG tablet Take 1 tablet (50 mg total) by mouth at bedtime as needed (cough).   No facility-administered encounter medications on file as of 09/24/2016.        Review of Systems: No fevers, chills, night sweats, weight loss, chest pain, or shortness of breath.   Objective:    General: Well Developed, well nourished, and in no acute distress.  Neuro: Alert and oriented x3, extra-ocular muscles intact, sensation grossly intact.  HEENT: Normocephalic, atraumatic  Skin: Warm and dry, no rashes. Cardiac: Regular rate and rhythm,  no murmurs rubs or gallops, no lower extremity edema.  Respiratory: Clear to auscultation bilaterally. Not using accessory muscles, speaking in full sentences.   Impression and Recommendations:   GAD -  GAD 7 score of 2.  Well controlled.  Continue current regimen. She says in the spring she actually like to consider maybe coming off of it. She's been on it for several years. Follow-up in 6 months. Refills sent to pharmacy today.  Obesity/BMI 47 -  She is considering a new procedure gastroenterology with a place in the stomach. I think eventually she like to consider weight loss surgery but her insurance would not cover it.  She does think she had some lab work done with Dr. Loyal Gambler over at [facility] so we'll try to get a copy of that and get it updated in our system.  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  She did have her flu vaccine done so we will get that updated in our system.

## 2016-09-25 DIAGNOSIS — J301 Allergic rhinitis due to pollen: Secondary | ICD-10-CM | POA: Diagnosis not present

## 2016-10-02 ENCOUNTER — Encounter: Payer: Self-pay | Admitting: Family Medicine

## 2016-10-02 DIAGNOSIS — J301 Allergic rhinitis due to pollen: Secondary | ICD-10-CM | POA: Diagnosis not present

## 2016-10-09 DIAGNOSIS — J301 Allergic rhinitis due to pollen: Secondary | ICD-10-CM | POA: Diagnosis not present

## 2016-10-16 DIAGNOSIS — J301 Allergic rhinitis due to pollen: Secondary | ICD-10-CM | POA: Diagnosis not present

## 2016-10-23 DIAGNOSIS — J301 Allergic rhinitis due to pollen: Secondary | ICD-10-CM | POA: Diagnosis not present

## 2016-10-30 DIAGNOSIS — J301 Allergic rhinitis due to pollen: Secondary | ICD-10-CM | POA: Diagnosis not present

## 2016-11-02 ENCOUNTER — Encounter: Payer: Self-pay | Admitting: *Deleted

## 2016-11-02 ENCOUNTER — Emergency Department
Admission: EM | Admit: 2016-11-02 | Discharge: 2016-11-02 | Disposition: A | Discharge status: Home / Self Care | Payer: BLUE CROSS/BLUE SHIELD | Source: Home / Self Care | Attending: Family Medicine | Admitting: Family Medicine

## 2016-11-02 DIAGNOSIS — J0101 Acute recurrent maxillary sinusitis: Secondary | ICD-10-CM | POA: Diagnosis not present

## 2016-11-02 MED ORDER — CLARITHROMYCIN 500 MG PO TABS: 500.0000 mg | ORAL_TABLET | Freq: Two times a day (BID) | ORAL | 0 refills | Status: DC

## 2016-11-02 MED ORDER — PREDNISONE 20 MG PO TABS: ORAL_TABLET | ORAL | 0 refills | Status: DC

## 2016-11-02 NOTE — ED Provider Notes (Signed)
CSN: [id number]     Arrival date & time 11/02/16  1209 History   First MD Initiated Contact with Patient 11/02/16 1236     Chief Complaint  Patient presents with  . Nasal Congestion   (Consider location/radiation/quality/duration/timing/severity/associated sxs/prior Treatment) HPI Barbara Mcmahon is a 41 y.o. female presenting to [facility] with c/o 2 weeks of gradually worsening sinus pain and pressure. Associated frontal headache, sinus congestion, sore throat, and pressure behind her eyes. Hx of frequent, recurrent sinusitis. She is currently seeing an ENT and they are discussing potential sinus surgery. For current symptoms, she has tried Sudafed and sinus rinses with mild relief.     Past Medical History:  Diagnosis Date  . Abnormal Pap smear and cervical HPV (human papillomavirus)   . Generalized anxiety disorder   . IBS (irritable bowel syndrome)   . PCOS (polycystic ovarian syndrome)   . Seizure disorder [facility])    Past Surgical History:  Procedure Laterality Date  . CHOLECYSTECTOMY    . COLPOSCOPY    . DILATION AND CURETTAGE OF UTERUS    . GYNECOLOGIC CRYOSURGERY    . KNEE SURGERY  right   Family History  Problem Relation Age of Onset  . Hypertension Father   . Diabetes Paternal Grandmother   . Hypertension Paternal Grandmother   . Stroke Paternal Grandmother   . Diabetes Paternal Grandfather   . Hypertension Paternal Grandfather   . Stroke Paternal Grandfather   . Hypertension Mother    Social History  Substance Use Topics  . Smoking status: Never Smoker  . Smokeless tobacco: Never Used  . Alcohol use No   OB History    Gravida Para Term Preterm AB Living   2 1 1   1 1    SAB TAB Ectopic Multiple Live Births   1       1     Review of Systems  Constitutional: Negative for chills and fever.  HENT: Positive for rhinorrhea, sinus pain, sinus pressure and sore throat. Negative for congestion, ear pain, trouble swallowing and voice change.   Respiratory: Negative for  cough and shortness of breath.   Cardiovascular: Negative for chest pain and palpitations.  Gastrointestinal: Negative for abdominal pain, diarrhea, nausea and vomiting.  Musculoskeletal: Negative for arthralgias, back pain and myalgias.  Skin: Negative for rash.  Neurological: Positive for headaches. Negative for dizziness and light-headedness.    Allergies  Codeine; Morphine and related; Oxycodone; Robitussin a-c [guaifenesin-codeine]; Shrimp [shellfish allergy]; and Yeast-related products  Home Medications   Prior to Admission medications   Medication Sig Start Date End Date Taking? Authorizing Provider  cetirizine (ZYRTEC) 10 MG tablet Take 10 mg by mouth daily.    Historical Provider, MD  clarithromycin (BIAXIN) 500 MG tablet Take 1 tablet (500 mg total) by mouth 2 (two) times daily. 11/02/16   Noland Fordyce, PA-C  desogestrel-ethinyl estradiol (KARIVA,AZURETTE,MIRCETTE) 0.15-0.02/0.01 MG (21/5) tablet Take 1 tablet by mouth daily.    Historical Provider, MD  EPINEPHrine (EPI-PEN) 0.3 mg/0.3 mL SOAJ injection Inject 0.3 mLs (0.3 mg total) into the muscle once as needed (anaphylaxis). 11/18/13   Marcial Pacas, DO  predniSONE (DELTASONE) 20 MG tablet 3 tabs po day one, then 2 po daily x 4 days 11/02/16   Noland Fordyce, PA-C  venlafaxine XR (EFFEXOR-XR) 75 MG 24 hr capsule Take 1 capsule (75 mg total) by mouth daily with breakfast. Must keep appt 09/24/16   Hali Marry, MD   Meds Ordered and Administered this Visit  Medications -  No data to display  BP 152/90 (BP Location: Left Arm)   Pulse 89   Temp 97.9 F (36.6 C) (Oral)   Resp 18   Ht 5\' 5"  (1.651 m)   Wt 280 lb (127 kg)   SpO2 97%   BMI 46.59 kg/m  No data found.   Physical Exam  Constitutional: She is oriented to person, place, and time. She appears well-developed and well-nourished. No distress.  HENT:  Head: Normocephalic and atraumatic.  Right Ear: Tympanic membrane normal.  Left Ear: Tympanic membrane  normal.  Nose: Mucosal edema present. Right sinus exhibits maxillary sinus tenderness. Right sinus exhibits no frontal sinus tenderness. Left sinus exhibits maxillary sinus tenderness and frontal sinus tenderness.  Mouth/Throat: Uvula is midline, oropharynx is clear and moist and mucous membranes are normal.  Eyes: EOM are normal.  Neck: Normal range of motion. Neck supple.  Cardiovascular: Normal rate and regular rhythm.   Pulmonary/Chest: Effort normal and breath sounds normal. No stridor. No respiratory distress. She has no wheezes. She has no rales.  Musculoskeletal: Normal range of motion.  Lymphadenopathy:    She has no cervical adenopathy.  Neurological: She is alert and oriented to person, place, and time.  Skin: Skin is warm and dry. She is not diaphoretic.  Psychiatric: She has a normal mood and affect. Her behavior is normal.  Nursing note and vitals reviewed.   Urgent Care Course     Procedures (including critical care time)  Labs Review Labs Reviewed - No data to display  Imaging Review No results found.    MDM   1. Acute recurrent maxillary sinusitis    Hx and exam c/w maxillary sinusitis.  Rx: Biaxin and prednisone  Encouraged f/u with ENT for recurrent infections. Patient verbalized understanding and agreement with treatment plan.     Noland Fordyce, PA-C 11/02/16 1420

## 2016-11-02 NOTE — ED Triage Notes (Signed)
Pt c/o HA, nasal congestion, ear and eye pressure and sore throat x 2 wks. Denies fever. Taking Sudafed.

## 2016-11-06 DIAGNOSIS — J301 Allergic rhinitis due to pollen: Secondary | ICD-10-CM | POA: Diagnosis not present

## 2016-11-12 ENCOUNTER — Ambulatory Visit: Payer: BLUE CROSS/BLUE SHIELD

## 2016-11-12 ENCOUNTER — Ambulatory Visit (INDEPENDENT_AMBULATORY_CARE_PROVIDER_SITE_OTHER): Payer: BLUE CROSS/BLUE SHIELD | Admitting: Family Medicine

## 2016-11-12 ENCOUNTER — Encounter: Payer: Self-pay | Admitting: Family Medicine

## 2016-11-12 VITALS — BP 148/78 | HR 100 | Wt 277.0 lb

## 2016-11-12 DIAGNOSIS — M7989 Other specified soft tissue disorders: Secondary | ICD-10-CM | POA: Diagnosis not present

## 2016-11-12 DIAGNOSIS — M79604 Pain in right leg: Secondary | ICD-10-CM | POA: Diagnosis not present

## 2016-11-12 NOTE — Progress Notes (Signed)
Barbara Mcmahon is a 41 y.o. female who presents to [place]: [place] today for right leg swelling. Patient travels frequently for work. She's been on multiple airplanes recently. She notes mild right leg swelling and calf tenderness. She's concerned she may have developed a DVT. She's never had a DVT before however. She does note a personal history of varicose veins in both legs. She does not take aspirin or use compression stockings when traveling. She does take a hormonal birth control pill daily. She denies chest pain or shortness of breath.   Past Medical History:  Diagnosis Date  . Abnormal Pap smear and cervical HPV (human papillomavirus)   . Generalized anxiety disorder   . IBS (irritable bowel syndrome)   . PCOS (polycystic ovarian syndrome)   . Seizure disorder [facility]    Past Surgical History:  Procedure Laterality Date  . CHOLECYSTECTOMY    . COLPOSCOPY    . DILATION AND CURETTAGE OF UTERUS    . GYNECOLOGIC CRYOSURGERY    . KNEE SURGERY  right   Social History  Substance Use Topics  . Smoking status: Never Smoker  . Smokeless tobacco: Never Used  . Alcohol use No   family history includes Diabetes in her paternal grandfather and paternal grandmother; Hypertension in her father, mother, paternal grandfather, and paternal grandmother; Stroke in her paternal grandfather and paternal grandmother.  ROS as above:  Medications: Current Outpatient Prescriptions  Medication Sig Dispense Refill  . cetirizine (ZYRTEC) 10 MG tablet Take 10 mg by mouth daily.    . clarithromycin (BIAXIN) 500 MG tablet Take 1 tablet (500 mg total) by mouth 2 (two) times daily. 28 tablet 0  . desogestrel-ethinyl estradiol (KARIVA,AZURETTE,MIRCETTE) 0.15-0.02/0.01 MG (21/5) tablet Take 1 tablet by mouth daily.    Marland Kitchen EPINEPHrine (EPI-PEN) 0.3 mg/0.3 mL SOAJ injection Inject 0.3 mLs (0.3  mg total) into the muscle once as needed (anaphylaxis). 1 Device 1  . venlafaxine XR (EFFEXOR-XR) 75 MG 24 hr capsule Take 1 capsule (75 mg total) by mouth daily with breakfast. Must keep appt 90 capsule 2   No current facility-administered medications for this visit.    Allergies  Allergen Reactions  . Codeine Hives and Itching    Pt says she can take percocet  . Morphine And Related Other (See Comments)    Big mood changes  . Oxycodone Hives  . Robitussin A-C [Guaifenesin-Codeine] Itching  . Shrimp [Shellfish Allergy] Hives and Swelling    Throat closes  . [place] Maintenance Health Maintenance  Topic Date Due  . PAP SMEAR  02/14/2019  . TETANUS/TDAP  01/12/2020  . INFLUENZA VACCINE  Completed  . HIV Screening  Completed     Exam:  BP (!) 148/78   Pulse 100   Wt 277 lb (125.6 kg)   SpO2 99%   BMI 46.10 kg/m  Gen: Well NAD HEENT: EOMI,  MMM Lungs: Normal work of breathing. CTABL Heart: RRR no MRG Abd: NABS, Soft. Nondistended, Nontender Exts: Brisk capillary refill, warm and well perfused. Slight increased right calf diameter. Varicose veins present. No palpable cords. Mildly tender to Squeeze. No erythema. Normal foot and knee motion.   No results found for this or any previous visit (from the past 72 hour(s)). No results found.    Assessment and Plan: 41 y.o. female with  Right leg swelling and pain. This certainly could be a DVT. Plan for vascular ultrasound. Additionally  this could simply be a calf pull. Plan for workup as above. If DVT ultrasound is negative we'll treat for Strain with eccentric ankle exercises. If DVT ultrasound is positive I will prescribe Eliquis.   Orders Placed This Encounter  Procedures  . US Venous Img Lower Unilateral Right    Standing Status:   Future    Number of Occurrences:   1    Standing Expiration Date:   01/10/2018    Order Specific Question:   Reason for Exam (SYMPTOM  OR DIAGNOSIS  REQUIRED)    Answer:   eval leg swelling right leg ?DVT    Order Specific Question:   Preferred imaging location?    Answer:   [facility]    Discussed warning signs or symptoms. Please see discharge instructions. Patient expresses understanding.

## 2016-11-12 NOTE — Patient Instructions (Addendum)
Thank you for coming in today. Go to the xray dept and get the ultrasound today.  If you have a DVT I will send in a rx for Eliquis and we will follow up in a few weeks.  If there is no DVT we will treat for calf strain.  Do the eccentric calf exercises we discussed.  I also recommend using compression socks.    Medial Head Gastrocnemius Tear Rehab Ask your health care provider which exercises are safe for you. Do exercises exactly as told by your health care provider and adjust them as directed. It is normal to feel mild stretching, pulling, tightness, or discomfort as you do these exercises, but you should stop right away if you feel sudden pain or your pain gets worse.Do not begin these exercises until told by your health care provider. Stretching and range of motion exercises These exercises warm up your muscles and joints and improve the movement and flexibility of your lower leg. These exercises also help to relieve pain and stiffness. Exercise A: Gastrocnemius stretch 1. Sit with your left / right leg extended. 2. Loop a belt or towel around the ball of your left / right foot. The ball of your foot is on the walking surface, right under your toes. 3. Hold both ends of the belt or towel. 4. Keep your left / right ankle and foot relaxed and keep your knee straight while you use the belt or towel to pull your foot and ankle toward you. Stop at the first point of resistance. 5. Hold this position for __________ seconds. Repeat __________ times. Complete this exercise __________ times a day. Exercise B: Ankle alphabet 1. Sit with your left / right leg supported at the lower leg.  Do not rest your foot on anything.  Make sure your foot has room to move freely. 2. Think of your left / right foot as a paintbrush, and move your foot to trace each letter of the alphabet in the air. Keep your hip and knee still while you trace. 3. Trace every letter from A to Z. Repeat __________ times.  Complete this exercise __________ times a day. Strengthening exercises These exercises build strength and endurance in your lower leg. Endurance is the ability to use your muscles for a long time, even after they get tired. Exercise C: Plantar flexors with band 1. Sit with your left / right leg extended. 2. Loop a rubber exercise band or tube around the ball of your left / right foot. The ball of your foot is on the walking surface, right under your toes. 3. While holding both ends of the band or tube, slowly point your toes downward, pushing them away from you. 4. Hold this position for __________ seconds. 5. Slowly return your foot to the starting position. Repeat __________ times. Complete this exercise __________ times a day. Exercise D: Plantar flexors, standing 1. Stand with your feet shoulder-width apart. 2. Place your hands on a wall or table to steady yourself as needed, but try not to use it very much for support. 3. Rise up on your toes. 4. If this exercise is too easy, try these options:  Shift your weight toward your left / right leg until you feel challenged.  If told by your health care provider, stand on your left / right foot only. 5. Hold this position for __________ seconds. Repeat __________ times. Complete this exercise __________ times a day. Exercise E: Plantar flexors, eccentric 1. Stand on the balls of  your feet on the edge of a step. The ball of your foot is on the walking surface, right under your toes. 2. Place your hands on a wall or railing for balance as needed, but try not to lean on it for support. 3. Rise up on your toes, using both legs to help. 4. Slowly shift all of your weight to your left / right foot and lift your other foot off the step. 5. Slowly lower your left / right heel so it drops below the level of the step. You will feel a slight stretch in your left / right calf. 6. Put your other foot back onto the step. Repeat __________ times. Complete  this exercise __________ times a day. This information is not intended to replace advice given to you by your health care provider. Make sure you discuss any questions you have with your health care provider. Document Released: 09/24/2005 Document Revised: 05/31/2016 Document Reviewed: 09/06/2015 Elsevier Interactive Patient Education  2017 [employer].    Deep Vein Thrombosis A deep vein thrombosis (DVT) is a blood clot (thrombus) that usually occurs in a deep, larger vein of the lower leg or the pelvis, or in an upper extremity such as the arm. These are dangerous and can lead to serious and even life-threatening complications if the clot travels to the lungs. A DVT can damage the valves in your leg veins so that instead of flowing upward, the blood pools in the lower leg. This is called post-thrombotic syndrome, and it can result in pain, swelling, discoloration, and sores on the leg. What are the causes? A DVT is caused by the formation of a blood clot in your leg, pelvis, or arm. Usually, several things contribute to the formation of blood clots. A clot may develop when:  Your blood flow slows down.  Your vein becomes damaged in some way.  You have a condition that makes your blood clot more easily. What increases the risk? A DVT is more likely to develop in:  People who are older, especially over 91 years of age.  People who are overweight (obese).  People who sit or lie still for a long time, such as during long-distance travel (over 4 hours), bed rest, hospitalization, or during recovery from certain medical conditions like a stroke.  People who do not engage in much physical activity (sedentary lifestyle).  People who have chronic breathing disorders.  People who have a personal or family history of blood clots or blood clotting disease.  People who have peripheral vascular disease (PVD), diabetes, or some types of cancer.  People who have heart disease, especially if  the person had a recent heart attack or has congestive heart failure.  People who have neurological diseases that affect the legs (leg paresis).  People who have had a traumatic injury, such as breaking a hip or leg.  People who have recently had major or lengthy surgery, especially on the hip, knee, or abdomen.  People who have had a central line placed inside a large vein.  People who take medicines that contain the hormone estrogen. These include birth control pills and hormone replacement therapy.  Pregnancy or during childbirth or the postpartum period.  Long plane flights (over 8 hours). What are the signs or symptoms?   Symptoms of a DVT can include:  Swelling of your leg or arm, especially if one side is much worse.  Warmth and redness of your leg or arm, especially if one side is much worse.  Pain in your arm or leg. If the clot is in your leg, symptoms may be more noticeable or worse when you stand or walk.  A feeling of pins and needles, if the clot is in the arm. The symptoms of a DVT that has traveled to the lungs (pulmonary embolism, PE) usually start suddenly and include:  Shortness of breath while active or at rest.  Coughing or coughing up blood or blood-tinged mucus.  Chest pain that is often worse with deep breaths.  Rapid or irregular heartbeat.  Feeling light-headed or dizzy.  Fainting.  Feeling anxious.  Sweating. There may also be pain and swelling in a leg if that is where the blood clot started. These symptoms may represent a serious problem that is an emergency. Do not wait to see if the symptoms will go away. Get medical help right away. Call your local emergency services (911 in the [place] Do not drive yourself to the hospital.  How is this diagnosed? Your health care provider will take a medical history and perform a physical exam. You may also have other tests, including:  Blood tests to assess the clotting properties of your  blood.  Imaging tests, such as CT, ultrasound, MRI, X-ray, and other tests to see if you have clots anywhere in your body. How is this treated? After a DVT is identified, it can be treated. The type of treatment that you receive depends on many factors, such as the cause of your DVT, your risk for bleeding or developing more clots, and other medical conditions that you have. Sometimes, a combination of treatments is necessary. Treatment options may be combined and include:  Monitoring the blood clot with ultrasound.  Taking medicines by mouth, such as newer blood thinners (anticoagulants), thrombolytics, or warfarin.  Taking anticoagulant medicine by injection or through an IV tube.  Wearing compression stockings or using different types ofdevices.  Surgery (rare) to remove the blood clot or to place a filter in your abdomen to stop the blood clot from traveling to your lungs. Treatments for a DVT are often divided into immediate treatment and long-term treatment (up to 3 months after DVT). You can work with your health care provider to choose the treatment program that is best for you. Follow these instructions at home: If you are taking a newer oral anticoagulant:  Take the medicine every single day at the same time each day.  Understand what foods and drugs interact with this medicine.  Understand that there are no regular blood tests required when using this medicine.  Understand the side effects of this medicine, including excessive bruising or bleeding. Ask your health care provider or pharmacist about other possible side effects. If you are taking warfarin:  Understand how to take warfarin and know which foods can affect how warfarin works in Veterinary surgeon.  Understand that it is dangerous to take too much or too little warfarin. Too much warfarin increases the risk of bleeding. Too little warfarin continues to allow the risk for blood clots.  Follow your PT and INR blood testing  schedule. The PT and INR results allow your health care provider to adjust your dose of warfarin. It is very important that you have your PT and INR tested as often as told by your health care provider.  Avoid major changes in your diet, or tell your health care provider before you change your diet. Arrange a visit with a registered dietitian to answer your questions. Many foods, especially foods that  are high in vitamin K, can interfere with warfarin and affect the PT and INR results. Eat a consistent amount of foods that are high in vitamin K, such as:  Spinach, kale, broccoli, cabbage, collard greens, turnip greens, Brussels sprouts, peas, cauliflower, seaweed, and parsley.  Beef liver and pork liver.  Green tea.  Soybean oil.  Tell your health care provider about any and all medicines, vitamins, and supplements that you take, including aspirin and other over-the-counter anti-inflammatory medicines. Be especially cautious with aspirin and anti-inflammatory medicines. Do not take those before you ask your health care provider if it is safe to do so. This is important because many medicines can interfere with warfarin and affect the PT and INR results.  Do not start or stop taking any over-the-counter or prescription medicine unless your health care provider or pharmacist tells you to do so. If you take warfarin, you will also need to do these things:  Hold pressure over cuts for longer than usual.  Tell your dentist and other health care providers that you are taking warfarin before you have any procedures in which bleeding may occur.  Avoid alcohol or drink very small amounts. Tell your health care provider if you change your alcohol intake.  Do not use tobacco products, including cigarettes, chewing tobacco, and e-cigarettes. If you need help quitting, ask your health care provider.  Avoid contact sports. General instructions  Take over-the-counter and prescription medicines only as  told by your health care provider. Anticoagulant medicines can have side effects, including easy bruising and difficulty stopping bleeding. If you are prescribed an anticoagulant, you will also need to do these things:  Hold pressure over cuts for longer than usual.  Tell your dentist and other health care providers that you are taking anticoagulants before you have any procedures in which bleeding may occur.  Avoid contact sports.  Wear a medical alert bracelet or carry a medical alert card that says you have had a PE.  Ask your health care provider how soon you can go back to your normal activities. Stay active to prevent new blood clots from forming.  Make sure to exercise while traveling or when you have been sitting or standing for a long period of time. It is very important to exercise. Exercise your legs by walking or by tightening and relaxing your leg muscles often. Take frequent walks.  Wear compression stockings as told by your health care provider to help prevent more blood clots from forming.  Do not use tobacco products, including cigarettes, chewing tobacco, and e-cigarettes. If you need help quitting, ask your health care provider.  Keep all follow-up appointments with your health care provider. This is important. How is this prevented? Take these actions to decrease your risk of developing another DVT:  Exercise regularly. For at least 30 minutes every day, engage in:  Activity that involves moving your arms and legs.  Activity that encourages good blood flow through your body by increasing your heart rate.  Exercise your arms and legs every hour during long-distance travel (over 4 hours). Drink plenty of water and avoid drinking alcohol while traveling.  Avoid sitting or lying in bed for long periods of time without moving your legs.  Maintain a weight that is appropriate for your height. Ask your health care provider what weight is healthy for you.  If you are a  woman who is over 67 years of age, avoid unnecessary use of medicines that contain estrogen. These include birth control  pills.  Do not smoke, especially if you take estrogen medicines. If you need help quitting, ask your health care provider. If you are hospitalized, prevention measures may include:  Early walking after surgery, as soon as your health care provider says that it is safe.  Receiving anticoagulants to prevent blood clots.If you cannot take anticoagulants, other options may be available, such as wearing compression stockings or using different types of devices. Get help right away if:  You have new or increased pain, swelling, or redness in an arm or leg.  You have numbness or tingling in an arm or leg.  You have shortness of breath while active or at rest.  You have chest pain.  You have a rapid or irregular heartbeat.  You feel light-headed or dizzy.  You cough up blood.  You notice blood in your vomit, bowel movement, or urine. These symptoms may represent a serious problem that is an emergency. Do not wait to see if the symptoms will go away. Get medical help right away. Call your local emergency services (911 in the [place] Do not drive yourself to the hospital.  This information is not intended to replace advice given to you by your health care provider. Make sure you discuss any questions you have with your health care provider. Document Released: 09/24/2005 Document Revised: 03/01/2016 Document Reviewed: 01/19/2015 Elsevier Interactive Patient Education  2017 [employer].

## 2016-11-16 DIAGNOSIS — J301 Allergic rhinitis due to pollen: Secondary | ICD-10-CM | POA: Diagnosis not present

## 2016-11-22 DIAGNOSIS — J301 Allergic rhinitis due to pollen: Secondary | ICD-10-CM | POA: Diagnosis not present

## 2016-11-28 DIAGNOSIS — J301 Allergic rhinitis due to pollen: Secondary | ICD-10-CM | POA: Diagnosis not present

## 2016-12-07 DIAGNOSIS — J301 Allergic rhinitis due to pollen: Secondary | ICD-10-CM | POA: Diagnosis not present

## 2016-12-14 DIAGNOSIS — J301 Allergic rhinitis due to pollen: Secondary | ICD-10-CM | POA: Diagnosis not present

## 2017-01-11 DIAGNOSIS — J301 Allergic rhinitis due to pollen: Secondary | ICD-10-CM | POA: Diagnosis not present

## 2017-01-17 DIAGNOSIS — J301 Allergic rhinitis due to pollen: Secondary | ICD-10-CM | POA: Diagnosis not present

## 2017-01-24 DIAGNOSIS — J301 Allergic rhinitis due to pollen: Secondary | ICD-10-CM | POA: Diagnosis not present

## 2017-01-31 DIAGNOSIS — J301 Allergic rhinitis due to pollen: Secondary | ICD-10-CM | POA: Diagnosis not present

## 2017-02-07 DIAGNOSIS — J301 Allergic rhinitis due to pollen: Secondary | ICD-10-CM | POA: Diagnosis not present

## 2017-02-14 DIAGNOSIS — J301 Allergic rhinitis due to pollen: Secondary | ICD-10-CM | POA: Diagnosis not present

## 2017-02-22 DIAGNOSIS — J301 Allergic rhinitis due to pollen: Secondary | ICD-10-CM | POA: Diagnosis not present

## 2017-03-01 DIAGNOSIS — J301 Allergic rhinitis due to pollen: Secondary | ICD-10-CM | POA: Diagnosis not present

## 2017-03-07 DIAGNOSIS — J301 Allergic rhinitis due to pollen: Secondary | ICD-10-CM | POA: Diagnosis not present

## 2017-03-08 ENCOUNTER — Encounter: Payer: Self-pay | Admitting: Emergency Medicine

## 2017-03-08 ENCOUNTER — Emergency Department
Admission: EM | Admit: 2017-03-08 | Discharge: 2017-03-08 | Disposition: A | Discharge status: Home / Self Care | Payer: BLUE CROSS/BLUE SHIELD | Source: Home / Self Care | Attending: Family Medicine | Admitting: Family Medicine

## 2017-03-08 DIAGNOSIS — T23152A Burn of first degree of left palm, initial encounter: Secondary | ICD-10-CM | POA: Diagnosis not present

## 2017-03-08 MED ORDER — ACETAMINOPHEN 500 MG PO TABS
600.0000 mg | ORAL_TABLET | Freq: Once | ORAL | Status: AC
  Administered 2017-03-08: 575 mg via ORAL

## 2017-03-08 MED ORDER — SILVER SULFADIAZINE 1 % EX CREA: TOPICAL_CREAM | CUTANEOUS | 0 refills | Status: DC

## 2017-03-08 NOTE — ED Provider Notes (Signed)
CSN: [id number]     Arrival date & time 03/08/17  1859 History   First MD Initiated Contact with Patient 03/08/17 1924     Chief Complaint  Patient presents with  . Burn   (Consider location/radiation/quality/duration/timing/severity/associated sxs/prior Treatment) HPI Barbara Mcmahon is a 41 y.o. female presenting to UC with c/o burn to palm of Left hand just PTA after accidentally grabbing a hot handle on a pan while cooking dinner.  Pain is burning, moderate to severe. No pain medication taken PTA as she came directly to UC.  Last tetanus: 2013.    Past Medical History:  Diagnosis Date  . Abnormal Pap smear and cervical HPV (human papillomavirus)   . Generalized anxiety disorder   . IBS (irritable bowel syndrome)   . PCOS (polycystic ovarian syndrome)   . Seizure disorder [facility])    Past Surgical History:  Procedure Laterality Date  . CHOLECYSTECTOMY    . COLPOSCOPY    . DILATION AND CURETTAGE OF UTERUS    . GYNECOLOGIC CRYOSURGERY    . KNEE SURGERY  right   Family History  Problem Relation Age of Onset  . Hypertension Father   . Diabetes Paternal Grandmother   . Hypertension Paternal Grandmother   . Stroke Paternal Grandmother   . Diabetes Paternal Grandfather   . Hypertension Paternal Grandfather   . Stroke Paternal Grandfather   . Hypertension Mother    Social History  Substance Use Topics  . Smoking status: Never Smoker  . Smokeless tobacco: Never Used  . Alcohol use No   OB History    Gravida Para Term Preterm AB Living   2 1 1   1 1    SAB TAB Ectopic Multiple Live Births   1       1     Review of Systems  Musculoskeletal: Negative for arthralgias, joint swelling and myalgias.  Skin: Positive for color change and wound. Negative for rash.  Neurological: Negative for weakness and numbness.    Allergies  Codeine; Morphine and related; Oxycodone; Robitussin a-c [guaifenesin-codeine]; Shrimp [shellfish allergy]; and Yeast-related products  Home  Medications   Prior to Admission medications   Medication Sig Start Date End Date Taking? Authorizing Provider  cetirizine (ZYRTEC) 10 MG tablet Take 10 mg by mouth daily.   Yes [provider]  venlafaxine XR (EFFEXOR-XR) 75 MG 24 hr capsule Take 1 capsule (75 mg total) by mouth daily with breakfast. Must keep appt 09/24/16  Yes Hali Marry, MD  clarithromycin (BIAXIN) 500 MG tablet Take 1 tablet (500 mg total) by mouth 2 (two) times daily. 11/02/16   Noland Fordyce, PA-C  desogestrel-ethinyl estradiol (KARIVA,AZURETTE,MIRCETTE) 0.15-0.02/0.01 MG (21/5) tablet Take 1 tablet by mouth daily.    [provider]  EPINEPHrine (EPI-PEN) 0.3 mg/0.3 mL SOAJ injection Inject 0.3 mLs (0.3 mg total) into the muscle once as needed (anaphylaxis). 11/18/13   Marcial Pacas, DO  silver sulfADIAZINE (SILVADENE) 1 % cream Apply to hand 1-2 times daily until burn is healed 03/08/17   Noland Fordyce, PA-C   Meds Ordered and Administered this Visit  Medications - No data to display  BP (!) 137/96 (BP Location: Left Arm)   Pulse 91   Temp 97.7 F (36.5 C) (Oral)   SpO2 98%  No data found.   Physical Exam  Constitutional: She is oriented to person, place, and time. She appears well-developed and well-nourished.  HENT:  Head: Normocephalic and atraumatic.  Eyes: EOM are normal.  Neck: Normal range of  motion.  Cardiovascular: Normal rate.   Pulmonary/Chest: Effort normal.  Musculoskeletal: Normal range of motion. She exhibits tenderness. She exhibits no edema.  Left hand: tenderness to palm (See skin exam) full ROM.  Neurological: She is alert and oriented to person, place, and time.  Skin: Skin is warm and dry. Capillary refill takes less than 2 seconds. There is erythema.  Left hand, palm aspect: superficial burn, no blistering. Skin in tact. Includes palm and volar aspect of fingers.   Psychiatric: She has a normal mood and affect. Her behavior is normal.  Nursing note and  vitals reviewed.   Urgent Care Course     Procedures (including critical care time)  Labs Review Labs Reviewed - No data to display  Imaging Review No results found.    MDM   1. Superficial burn of palm of left hand, initial encounter    Superficial burn to Left hand, palmar aspect.  Silvadene and bandage applied.  Rx: Silvadene  Home care instructions provided. Advised pt not to pop blisters if they develop. F/u in 4-5 days if not improving, sooner if worsening.     Noland Fordyce, PA-C 03/08/17 1943

## 2017-03-08 NOTE — ED Triage Notes (Signed)
Patient presents to [facility]  With a complaint of a burn to the posterior portion of the left hand. Patient states this happened while she was cooking.

## 2017-03-14 DIAGNOSIS — J301 Allergic rhinitis due to pollen: Secondary | ICD-10-CM | POA: Diagnosis not present

## 2017-03-22 DIAGNOSIS — J301 Allergic rhinitis due to pollen: Secondary | ICD-10-CM | POA: Diagnosis not present

## 2017-03-25 ENCOUNTER — Ambulatory Visit: Payer: Self-pay | Admitting: Family Medicine

## 2017-03-29 DIAGNOSIS — J301 Allergic rhinitis due to pollen: Secondary | ICD-10-CM | POA: Diagnosis not present

## 2017-04-05 DIAGNOSIS — J301 Allergic rhinitis due to pollen: Secondary | ICD-10-CM | POA: Diagnosis not present

## 2017-04-08 ENCOUNTER — Encounter: Payer: Self-pay | Admitting: Family Medicine

## 2017-04-08 ENCOUNTER — Ambulatory Visit (INDEPENDENT_AMBULATORY_CARE_PROVIDER_SITE_OTHER): Payer: BLUE CROSS/BLUE SHIELD | Admitting: Family Medicine

## 2017-04-08 VITALS — BP 138/80 | HR 86 | Resp 18 | Wt 280.6 lb

## 2017-04-08 DIAGNOSIS — F411 Generalized anxiety disorder: Secondary | ICD-10-CM | POA: Insufficient documentation

## 2017-04-08 DIAGNOSIS — R030 Elevated blood-pressure reading, without diagnosis of hypertension: Secondary | ICD-10-CM | POA: Diagnosis not present

## 2017-04-08 NOTE — Progress Notes (Signed)
Subjective:    CC: Anxiety   HPI:  Rolin Barry been on Effexor for years because at times she just gets a little overwhelmed with feeling like she needs to be a perfectionist but denies any significant anxiety symptoms. She denies feeling down or depressed. But she does report feeling anxious or nervous several days a week.  Morbid Obesity - she is recently started a ketogenic diet and has actually been doing well on it. She's lost about 7 pounds so far and has been exercising. 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    Elevated blood pressure. The last 4 times that she's been here her blood pressures have been high.  Past medical history, Surgical history, Family history not pertinant except as noted below, Social history, Allergies, and medications have been entered into the medical record, reviewed, and corrections made.   Review of Systems: No fevers, chills, night sweats, weight loss, chest pain, or shortness of breath.   Objective:    General: Well Developed, well nourished, and in no acute distress.  Neuro: Alert and oriented x3, extra-ocular muscles intact, sensation grossly intact.  HEENT: Normocephalic, atraumatic  Skin: Warm and dry, no rashes. Cardiac: Regular rate and rhythm, no murmurs rubs or gallops, no lower extremity edema.  Respiratory: Clear to auscultation bilaterally. Not using accessory muscles, speaking in full sentences.   Impression and Recommendations:    GAD - GAD 7 score of 2 and PHQ 9 score of 0. Overall doing absolutely fantastic. We'll continue current regimen with venlafaxine. Discussed that if any point she is happy to consider weaning it then to please let me know. If she is otherwise doing well and we can readdress this again in 6-12 months.  Morbid Obesity/BMI 46. - Artie down 7 pounds and doing fantastic. She is actually feeling really well on the ketogenic diet.  Elevated blood pressure-it's been elevated the last couple  times she's been here. Will recheck in the office visit today. If it's still elevated then will need to have her come in in a couple weeks for recheck just to confirm.

## 2017-04-12 DIAGNOSIS — J301 Allergic rhinitis due to pollen: Secondary | ICD-10-CM | POA: Diagnosis not present

## 2017-04-16 ENCOUNTER — Encounter: Payer: Self-pay | Admitting: Sports Medicine

## 2017-04-16 ENCOUNTER — Ambulatory Visit (INDEPENDENT_AMBULATORY_CARE_PROVIDER_SITE_OTHER): Payer: BLUE CROSS/BLUE SHIELD | Admitting: Sports Medicine

## 2017-04-16 DIAGNOSIS — M533 Sacrococcygeal disorders, not elsewhere classified: Secondary | ICD-10-CM | POA: Insufficient documentation

## 2017-04-16 DIAGNOSIS — M47816 Spondylosis without myelopathy or radiculopathy, lumbar region: Secondary | ICD-10-CM

## 2017-04-16 MED ORDER — MELOXICAM 15 MG PO TABS: ORAL_TABLET | ORAL | 3 refills | Status: DC

## 2017-04-16 NOTE — Progress Notes (Signed)
   Subjective:    I'm seeing this patient as a consultation for:  Dr. Beatrice Lecher  CC: Low back pain  HPI: For years this pleasant 41 year old female has had axial pain in her back, bilateral, worse with laying flat, standing, with mild gelling. No radicular pain, really doesn't have much in terms of discogenic pain. Symptoms are moderate, persistent, no bowel or bladder dysfunction, saddle numbness, constitutional symptoms, no mechanical symptoms.  Past medical history:  Negative.  See flowsheet/record as well for more information.  Surgical history: Negative.  See flowsheet/record as well for more information.  Family history: Negative.  See flowsheet/record as well for more information.  Social history: Negative.  See flowsheet/record as well for more information.  Allergies, and medications have been entered into the medical record, reviewed, and no changes needed.   Review of Systems: No headache, visual changes, nausea, vomiting, diarrhea, constipation, dizziness, abdominal pain, skin rash, fevers, chills, night sweats, weight loss, swollen lymph nodes, body aches, joint swelling, muscle aches, chest pain, shortness of breath, mood changes, visual or auditory hallucinations.   Objective:   General: Well Developed, well nourished, and in no acute distress.  Neuro/Psych: Alert and oriented x3, extra-ocular muscles intact, able to move all 4 extremities, sensation grossly intact. Skin: Warm and dry, no rashes noted.  Respiratory: Not using accessory muscles, speaking in full sentences, trachea midline.  Cardiovascular: Pulses palpable, no extremity edema. Abdomen: Does not appear distended. Back Exam:  Inspection: Unremarkable  Motion: Flexion 45 deg, Extension 45 deg, Side Bending to 45 deg bilaterally,  Rotation to 45 deg bilaterally  SLR laying: Negative  XSLR laying: Negative  Palpable tenderness: None. FABER: negative. Sensory change: Gross sensation intact to all  lumbar and sacral dermatomes.  Reflexes: 2+ at both patellar tendons, 2+ at achilles tendons, Babinski's downgoing.  Strength at foot  Plantar-flexion: 5/5 Dorsi-flexion: 5/5 Eversion: 5/5 Inversion: 5/5  Leg strength  Quad: 5/5 Hamstring: 5/5 Hip flexor: 5/5 Hip abductors: 5/5  Gait unremarkable.  X-rays reviewed and do show L5-S1 degenerative disc disease and L5-S1 facet arthritis.  Impression and Recommendations:   This case required medical decision making of moderate complexity.  Lumbar spondylosis There is L5-S1 degenerative disc disease without spondylolisthesis. There is also L5-S1 bilateral facet arthritis, pain is more facetogenic, axial, without a radicular component. We will start conservatively with meloxicam, formal physical therapy, return to see me in one month or 6 weeks and we will proceed with MRI for interventional planning if no better.

## 2017-04-16 NOTE — Assessment & Plan Note (Signed)
There is L5-S1 degenerative disc disease without spondylolisthesis. There is also L5-S1 bilateral facet arthritis, pain is more facetogenic, axial, without a radicular component. We will start conservatively with meloxicam, formal physical therapy, return to see me in one month or 6 weeks and we will proceed with MRI for interventional planning if no better.

## 2017-04-19 DIAGNOSIS — J301 Allergic rhinitis due to pollen: Secondary | ICD-10-CM | POA: Diagnosis not present

## 2017-04-22 ENCOUNTER — Encounter: Payer: Self-pay | Admitting: Sports Medicine

## 2017-04-22 MED ORDER — PREDNISONE 50 MG PO TABS: 50.0000 mg | ORAL_TABLET | Freq: Every day | ORAL | 0 refills | Status: DC

## 2017-04-23 DIAGNOSIS — J301 Allergic rhinitis due to pollen: Secondary | ICD-10-CM | POA: Diagnosis not present

## 2017-05-01 ENCOUNTER — Ambulatory Visit (INDEPENDENT_AMBULATORY_CARE_PROVIDER_SITE_OTHER): Payer: BLUE CROSS/BLUE SHIELD | Admitting: Physical Therapy

## 2017-05-01 ENCOUNTER — Encounter: Payer: Self-pay | Admitting: Physical Therapy

## 2017-05-01 DIAGNOSIS — M545 Low back pain, unspecified: Secondary | ICD-10-CM

## 2017-05-01 DIAGNOSIS — M6281 Muscle weakness (generalized): Secondary | ICD-10-CM

## 2017-05-01 DIAGNOSIS — M62830 Muscle spasm of back: Secondary | ICD-10-CM

## 2017-05-01 NOTE — Therapy (Signed)
[facility] [place], [place], [address] Phone: [phone]   Fax:  [phone]  Physical Therapy Evaluation  Patient Details  Name: Barbara Mcmahon MRN: [id number] Date of Birth: [birth year] Referring Provider: Dr Jule Economy  Encounter Date: 05/01/2017      PT End of Session - 05/01/17 1608    Visit Number 1   Number of Visits 8   Date for PT Re-Evaluation 05/29/17   PT Start Time 1608   PT Stop Time 1700   PT Time Calculation (min) 52 min   Activity Tolerance Patient tolerated treatment well      Past Medical History:  Diagnosis Date  . Abnormal Pap smear and cervical HPV (human papillomavirus)   . Generalized anxiety disorder   . IBS (irritable bowel syndrome)   . PCOS (polycystic ovarian syndrome)   . Seizure disorder [facility])     Past Surgical History:  Procedure Laterality Date  . CHOLECYSTECTOMY    . COLPOSCOPY    . DILATION AND CURETTAGE OF UTERUS    . GYNECOLOGIC CRYOSURGERY    . KNEE SURGERY  right    There were no vitals filed for this visit.       Subjective Assessment - 05/01/17 1609    Subjective Pt reports she noticed low back pain about 6 months ago.  Her job has changed and she is having to travel a lot more and is on an airplane more.  She works with a Physiological scientist and has tried prone press ups, these made it worse.  Currently trying to walk, perform bending over and these help.  She has been trying to lose weight.    Pertinent History Lt knee scope over 20 yrs ago   How long can you sit comfortably? tolerates about 45 minutes   How long can you walk comfortably? no limitations if she takes her time.    Diagnostic tests x-ray - degenerative changes of spine   Patient Stated Goals not be in pain, loosen up and return prior level of exercise   Currently in Pain? Yes   Pain Score 3   at its worse 7/10   Pain Location Back   Pain Orientation Left;Right;Lower   Pain  Descriptors / Indicators Aching;Dull;Spasm   Pain Type Chronic pain   Pain Onset More than a month ago   Pain Frequency Constant   Aggravating Factors  lying down to sleep - waking 5-6 times a night, bending back, prolonged sedintary positions   Pain Relieving Factors prednisone when on meds.             [facility] PT Assessment - 05/01/17 0001      Assessment   Medical Diagnosis lumbar spondylosis   Referring Provider Dr Jule Economy   Onset Date/Surgical Date 11/01/16   Hand Dominance Right   Next MD Visit 05/17/17   Prior Therapy none     Precautions   Precautions None     Balance Screen   Has the patient fallen in the past 6 months No   Has the patient had a decrease in activity level because of a fear of falling?  No     Home Environment   Living Environment Private residence   Home Layout Two level  no trouble on stairs     Prior Function   Level of Independence Independent   Vocation Full time employment   Vocation Requirements a lot of plane travel, has to lift suitcase, then on her feet  alot   Leisure hang out with and play with kids.      Observation/Other Assessments   Observations --   Focus on Therapeutic Outcomes (FOTO)  46% limited     Functional Tests   Functional tests Squat;Single leg stance     Squat   Comments knees in front of toes, no pain     Single Leg Stance   Comments Rt WNL, Lt 12 sec with a lot of accessory motion and some popping inher low back.      Posture/Postural Control   Posture/Postural Control Postural limitations   Postural Limitations Rounded Shoulders;Forward head  extra abdominal girth, obsese     ROM / Strength   AROM / PROM / Strength Strength;AROM     AROM   AROM Assessment Site Lumbar   Lumbar Flexion to lower shin   Lumbar Extension 50% present, pain Rt side low back   Lumbar - Right Side Bend WNL   Lumbar - Left Side Bend WNL motion however hip hiked during motion   Lumbar - Right Rotation WNL   Lumbar - Left  Rotation 50% present with Rt sided low back pain     Strength   Strength Assessment Site Lumbar;Ankle;Knee;Hip   Right/Left Hip Right;Left   Right Hip Flexion 5/5   Right Hip Extension 4+/5   Right Hip ABduction --  5-/5   Left Hip Flexion 5/5   Left Hip Extension 4/5   Left Hip ABduction 4+/5   Right/Left Knee --  Rt WNL, Lt grossly 4+/5   Right/Left Ankle --  WNL   Lumbar Flexion --  transverse abdominis  poor - with back spasms   Lumbar Extension --  multifidi poor     Flexibility   Soft Tissue Assessment /Muscle Length yes   Hamstrings bilat ~ 90 degrees   Quadriceps bilat tight ~ 95 degrees      Palpation   Spinal mobility pain with CPA and bilat UPA mobs L5-3   Palpation comment very tight bilat QL, lumbar paraspinals, Rt gluts/piriformis             Objective measurements completed on examination: See above findings.          [facility] PT Treatment/Exercise - 05/01/17 0001      Exercises   Exercises Lumbar     Lumbar Exercises: Stretches   Single Knee to Chest Stretch 20 seconds   Lower Trunk Rotation 20 seconds   Prone Mid Back Stretch Limitations 10 reps cat/cow and childs pose     Modalities   Modalities Electrical Stimulation;Moist Heat     Moist Heat Therapy   Number Minutes Moist Heat 15 Minutes   Moist Heat Location Lumbar Spine     Electrical Stimulation   Electrical Stimulation Location lumbar   Electrical Stimulation Action IFC   Electrical Stimulation Parameters to tolerance   Electrical Stimulation Goals Pain;Tone                PT Education - 05/01/17 1651    Education provided Yes   Education Details HEP and home TENs   Person(s) Educated Patient   Methods Explanation;Demonstration;Handout   Comprehension Verbalized understanding;Returned demonstration             PT Long Term Goals - 05/01/17 1700      PT LONG TERM GOAL #1   Title I with advanced HEP to include yoga and/or pilates    Time 4    Period Weeks  Status New   Target Date 05/29/17     PT LONG TERM GOAL #2   Title report =/> 75% reduction of low back pain when she is traveling    Time 4   Period Weeks   Status New   Target Date 05/29/17     PT LONG TERM GOAL #3   Title perform core work with good stability and no reports of back pain    Time 4   Period Weeks   Status New   Target Date 05/29/17     PT LONG TERM GOAL #4   Title demo full pain free lumbar ROM   Time 4   Period Weeks   Status New   Target Date 05/29/17     PT LONG TERM GOAL #5   Title improve FOTO =/< 32% limited    Time 4   Period Weeks   Status New   Target Date 05/29/17                Plan - 05/01/17 1652    Clinical Impression Statement 41 yo female presents with 6 month h/o LBP. she had a change in her job and is now required to travel a lot more and feels this is contributing to her problems. Cydnie has decreased spinal motions, weakness and multiple trigger points/tightness in her low back bilaterally. Obesity is adding to her back issues and she wishes to do more exercise to help loose weight. She will traveling on/of over the next two weeks and have limited availability however will be back in town for a while after that.    Clinical Presentation Evolving   Clinical Decision Making Low   Rehab Potential Good   PT Frequency 2x / week   PT Duration 4 weeks   PT Treatment/Interventions Moist Heat;Traction;Ultrasound;Therapeutic exercise;Dry needling;Taping;Manual techniques;Neuromuscular re-education;Cryotherapy;Electrical Stimulation;Iontophoresis 4mg /ml Dexamethasone;Patient/family education   PT Next Visit Plan manual work low back, core stability, modalities PRN for pain and muscle tightness.    Consulted and Agree with Plan of Care Patient      Patient will benefit from skilled therapeutic intervention in order to improve the following deficits and impairments:  Decreased range of motion, Obesity, Increased muscle  spasms, Pain, Decreased strength  Visit Diagnosis: Acute right-sided low back pain without sciatica - Plan: PT plan of care cert/re-cert  Muscle weakness (generalized) - Plan: PT plan of care cert/re-cert  Muscle spasm of back - Plan: PT plan of care cert/re-cert     Problem List Patient Active Problem List   Diagnosis Date Noted  . Lumbar spondylosis 04/16/2017  . GAD (generalized anxiety disorder) 04/08/2017  . Shrimp allergy 09/24/2016  . 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 MRSA (methicillin resistant Staphylococcus aureus) 01/14/2015  . Family history of hypertrophic cardiomyopathy 04/23/2013  . Raynaud disease 11/19/2012  . Irritable bowel syndrome with diarrhea (colonoscopy 2007) 07/04/2012  . Obesity, morbid (more than 100 lbs over ideal weight or BMI > 40) ([place]) 06/30/2012  . Anxiety 06/30/2012  . PCOS (polycystic ovarian syndrome) 06/30/2012    Manuela Schwartz Safir Michalec PT  05/01/2017, 5:08 PM  [facility] [place], [place], [address] Phone: [phone]   Fax:  [phone]  Name: Barbara Mcmahon MRN: [id number] Date of Birth: October 08, 1976

## 2017-05-01 NOTE — Patient Instructions (Addendum)
Knee-to-Chest Stretch: Unilateral    With hand behind right knee, pull knee in to chest until a comfortable stretch is felt in lower back and buttocks. Keep back relaxed. Hold _30-60___ seconds. Repeat __1__ times per set. Do _1___ sets per session. Do _as much as you want___ sessions per day.  Lower Trunk Rotation Stretch    Keeping back flat and feet together, rotate knees to left side. Hold __30-60__ seconds. Repeat on other side.  Repeat __1__ times per set. Do __1__ sets per session. Do ____ sessions per day.   Mid-Back Stretch    Push chest toward floor, reaching forward as far as possible. Hold __30-60__ seconds. Repeat __1__ times per set. Do __1__ sets per session. Do _as much as wanted, at least once a day___ sessions per day.  Cat / Cow Flow    Inhale, press spine toward ceiling like a Halloween cat. Keeping strength in arms and abdominals, exhale to soften spine through neutral and into cow pose. Open chest and arch back. Initiate movement between cat and cow at tailbone, one vertebrae at a time. Repeat _10___ times. As often as you want.  Copyright  VHI. All rights reserved.   TENS UNIT: This is helpful for muscle pain and spasm.   Search and Purchase a TENS 7000 2nd edition at www.tenspros.com. It should be less than $30.     TENS unit instructions: Do not shower or bathe with the unit on Turn the unit off before removing electrodes or batteries If the electrodes lose stickiness add a drop of water to the electrodes after they are disconnected from the unit and place on plastic sheet. If you continued to have difficulty, call the TENS unit company to purchase more electrodes. Do not apply lotion on the skin area prior to use. Make sure the skin is clean and dry as this will help prolong the life of the electrodes. After use, always check skin for unusual red areas, rash or other skin difficulties. If there are any skin problems, does not apply electrodes to the  same area. Never remove the electrodes from the unit by pulling the wires. Do not use the TENS unit or electrodes other than as directed. Do not change electrode placement without consultating your therapist or physician. Keep 2 fingers with between each electrode. Wear time ratio is 2:1, on to off times.    For example on for 30 minutes off for 15 minutes and then on for 30 minutes off for 15 minutes

## 2017-05-03 DIAGNOSIS — J301 Allergic rhinitis due to pollen: Secondary | ICD-10-CM | POA: Diagnosis not present

## 2017-05-09 ENCOUNTER — Ambulatory Visit (INDEPENDENT_AMBULATORY_CARE_PROVIDER_SITE_OTHER): Payer: BLUE CROSS/BLUE SHIELD | Admitting: Physical Therapy

## 2017-05-09 ENCOUNTER — Encounter: Payer: Self-pay | Admitting: Physical Therapy

## 2017-05-09 DIAGNOSIS — M6281 Muscle weakness (generalized): Secondary | ICD-10-CM | POA: Diagnosis not present

## 2017-05-09 DIAGNOSIS — M62830 Muscle spasm of back: Secondary | ICD-10-CM

## 2017-05-09 DIAGNOSIS — M545 Low back pain, unspecified: Secondary | ICD-10-CM

## 2017-05-09 NOTE — Patient Instructions (Addendum)
Trigger Point Dry Needling  . What is Trigger Point Dry Needling (DN)? o DN is a physical therapy technique used to treat muscle pain and dysfunction. Specifically, DN helps deactivate muscle trigger points (muscle knots).  o A thin filiform needle is used to penetrate the skin and stimulate the underlying trigger point. The goal is for a local twitch response (LTR) to occur and for the trigger point to relax. No medication of any kind is injected during the procedure.   . What Does Trigger Point Dry Needling Feel Like?  o The procedure feels different for each individual patient. Some patients report that they do not actually feel the needle enter the skin and overall the process is not painful. Very mild bleeding may occur. However, many patients feel a deep cramping in the muscle in which the needle was inserted. This is the local twitch response.   Marland Kitchen How Will I feel after the treatment? o Soreness is normal, and the onset of soreness may not occur for a few hours. Typically this soreness does not last longer than two days.  o Bruising is uncommon, however; ice can be used to decrease any possible bruising.  o In rare cases feeling tired or nauseous after the treatment is normal. In addition, your symptoms may get worse before they get better, this period will typically not last longer than 24 hours.   . What Can I do After My Treatment? o Increase your hydration by drinking more water for the next 24 hours. o You may place ice or heat on the areas treated that have become sore, however, do not use heat on inflamed or bruised areas. Heat often brings more relief post needling. o You can continue your regular activities, but vigorous activity is not recommended initially after the treatment for 24 hours. o DN is best combined with other physical therapy such as strengthening, stretching, and other therapies.   Achilles / Gastroc, Standing    Stand, right foot behind, heel on floor and  turned slightly out, leg straight, forward leg bent. Move hips forward. Hold _30__ seconds. Repeat _1__ times per session. Do _1__ sessions per day.  TENS stands for Transcutaneous Electrical Nerve Stimulation. In other words, electrical impulses are allowed to pass through the skin in order to excite a nerve.   Purpose and Use of TENS:  TENS is a method used to manage acute and chronic pain without the use of drugs. It has been effective in managing pain associated with surgery, sprains, strains, trauma, rheumatoid arthritis, and neuralgias. It is a non-addictive, low risk, and non-invasive technique used to control pain. It is not, by any means, a curative form of treatment.   How TENS Works:  Most TENS units are a Paramedic unit powered by one 9 volt battery. Attached to the outside of the unit are two lead wires where two pins and/or snaps connect on each wire. All units come with a set of four reusable pads or electrodes. These are placed on the skin surrounding the area involved. By inserting the leads into  the pads, the electricity can pass from the unit making the circuit complete.  As the intensity is turned up slowly, the electrical current enters the body from the electrodes through the skin to the surrounding nerve fibers. This triggers the release of hormones from within the body. These hormones contain pain relievers. By increasing the circulation of these hormones, the person's pain may be lessened. It is also believed that  the electrical stimulation itself helps to block the pain messages being sent to the brain, thus also decreasing the body's perception of pain.   Hazards:  TENS units are NOT to be used by patients with PACEMAKERS, DEFIBRILLATORS, DIABETIC PUMPS, PREGNANT WOMEN, and patients with SEIZURE DISORDERS.  TENS units are NOT to be used over the heart, throat, brain, or spinal cord.  One of the major side effects from the TENS unit may be skin irritation. Some  people may develop a rash if they are sensitive to the materials used in the electrodes or the connecting wires.   Wear the unit for 2:1 on:off ratio,   Avoid overuse due the body getting used to the stem making it not as effective over time.

## 2017-05-09 NOTE — Therapy (Signed)
[place], [place], [address] Phone: [phone]   Fax:  [phone]  Physical Therapy Treatment  Patient Details  Name: Barbara Mcmahon MRN: [id number] Date of Birth: 07-17-1976 Referring Provider: Dr Jule Economy  Encounter Date: 05/09/2017      PT End of Session - 05/09/17 1659    Visit Number 2   Number of Visits 8   Date for PT Re-Evaluation 05/29/17   PT Start Time 1618   PT Stop Time 1709   PT Time Calculation (min) 51 min   Activity Tolerance Patient tolerated treatment well      Past Medical History:  Diagnosis Date  . Abnormal Pap smear and cervical HPV (human papillomavirus)   . Generalized anxiety disorder   . IBS (irritable bowel syndrome)   . PCOS (polycystic ovarian syndrome)   . Seizure disorder [facility])     Past Surgical History:  Procedure Laterality Date  . CHOLECYSTECTOMY    . COLPOSCOPY    . DILATION AND CURETTAGE OF UTERUS    . GYNECOLOGIC CRYOSURGERY    . KNEE SURGERY  right    There were no vitals filed for this visit.      Subjective Assessment - 05/09/17 1620    Subjective Pt just returned to town yesterday and is very sore today. Pain became really bad yesterday.    Pertinent History Lt knee scope over 20 yrs ago   Patient Stated Goals not be in pain, loosen up and return prior level of exercise   Currently in Pain? Yes   Pain Score 7    Pain Location Back   Pain Orientation Right;Left;Lower   Pain Type Chronic pain   Aggravating Factors  lying down, everything today   Pain Relieving Factors TENs - temporary            [facility] PT Assessment - 05/09/17 0001      Assessment   Medical Diagnosis lumbar spondylosis     Palpation   Palpation comment very tight in bilat lumbar paraspinals and Lt gluts/piriformisn                     [facility] Adult PT Treatment/Exercise - 05/09/17 0001      Lumbar Exercises: Aerobic   Stationary Bike  nustep L5x5'     Lumbar Exercises: Quadruped   Madcat/Old Horse 10 reps   Madcat/Old Horse Limitations childs pose     Modalities   Modalities Electrical Stimulation;Moist Heat     Moist Heat Therapy   Number Minutes Moist Heat 15 Minutes   Moist Heat Location Lumbar Spine  buttocks     Electrical Stimulation   Electrical Stimulation Location Lt lumbar and buttocks   Electrical Stimulation Action IFC   Electrical Stimulation Parameters to tolerance   Electrical Stimulation Goals Pain;Tone     Manual Therapy   Manual Therapy Soft tissue mobilization   Soft tissue mobilization STM to Lt gluts/piriformis, lumbar paraspinals.           Trigger Point Dry Needling - 05/09/17 1700    Consent Given? Yes   Education Handout Provided Yes   Muscles Treated Upper Body Longissimus  all Lt side   Muscles Treated Lower Body Gluteus maximus;Gluteus minimus;Piriformis   Longissimus Response Palpable increased muscle length;Twitch response elicited  K8-6   Gluteus Maximus Response Palpable increased muscle length;Twitch response elicited   Gluteus Minimus Response Twitch response elicited;Palpable increased muscle length   Piriformis Response Palpable increased  muscle length;Twitch response elicited                   PT Long Term Goals - 05/09/17 1622      PT LONG TERM GOAL #1   Title I with advanced HEP to include yoga and/or pilates    Status On-going     PT LONG TERM GOAL #2   Title report =/> 75% reduction of low back pain when she is traveling    Status On-going     PT LONG TERM GOAL #3   Title perform core work with good stability and no reports of back pain    Status On-going     PT LONG TERM GOAL #4   Title demo full pain free lumbar ROM   Status On-going     PT LONG TERM GOAL #5   Title improve FOTO =/< 32% limited    Status On-going               Plan - 05/09/17 1808    Clinical Impression Statement This is [name]'s second visit, she was  out of town and traveling on the plane flared up her pain yesterday.  She responded well to manual therapy and DN with increased motion and decreased pain after treatment.  No goals met.    Rehab Potential Good   PT Frequency 2x / week   PT Duration 4 weeks   PT Treatment/Interventions Moist Heat;Traction;Ultrasound;Therapeutic exercise;Dry needling;Taping;Manual techniques;Neuromuscular re-education;Cryotherapy;Electrical Stimulation;Iontophoresis 33m/ml Dexamethasone;Patient/family education   PT Next Visit Plan assess response to DN/manual work and add in initial core work.    Consulted and Agree with Plan of Care Patient      Patient will benefit from skilled therapeutic intervention in order to improve the following deficits and impairments:  Decreased range of motion, Obesity, Increased muscle spasms, Pain, Decreased strength  Visit Diagnosis: Acute right-sided low back pain without sciatica  Muscle weakness (generalized)  Muscle spasm of back     Problem List Patient Active Problem List   Diagnosis Date Noted  . Lumbar spondylosis 04/16/2017  . GAD (generalized anxiety disorder) 04/08/2017  . Shrimp allergy 09/24/2016  . 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 MRSA (methicillin resistant Staphylococcus aureus) 01/14/2015  . Family history of hypertrophic cardiomyopathy 04/23/2013  . Raynaud disease 11/19/2012  . Irritable bowel syndrome with diarrhea (colonoscopy 2007) 07/04/2012  . Obesity, morbid (more than 100 lbs over ideal weight or BMI > 40) ([facility] 06/30/2012  . Anxiety 06/30/2012  . PCOS (polycystic ovarian syndrome) 06/30/2012    SJeral PinchPT 05/09/2017, 6:11 PM  [facility] [address]Phone: [phone]  Fax:  [phone] Name: CMaya ArcandMRN: [id number] of Birth: [birth year]

## 2017-05-10 DIAGNOSIS — J301 Allergic rhinitis due to pollen: Secondary | ICD-10-CM | POA: Diagnosis not present

## 2017-05-12 ENCOUNTER — Encounter: Payer: Self-pay | Admitting: Physical Therapy

## 2017-05-12 ENCOUNTER — Encounter: Payer: Self-pay | Admitting: Sports Medicine

## 2017-05-13 MED ORDER — PREDNISONE 10 MG (48) PO TBPK: ORAL_TABLET | Freq: Every day | ORAL | 0 refills | Status: DC

## 2017-05-16 ENCOUNTER — Ambulatory Visit (INDEPENDENT_AMBULATORY_CARE_PROVIDER_SITE_OTHER): Payer: BLUE CROSS/BLUE SHIELD | Admitting: Physical Therapy

## 2017-05-16 ENCOUNTER — Encounter: Payer: Self-pay | Admitting: Physical Therapy

## 2017-05-16 DIAGNOSIS — M6281 Muscle weakness (generalized): Secondary | ICD-10-CM

## 2017-05-16 DIAGNOSIS — M62830 Muscle spasm of back: Secondary | ICD-10-CM

## 2017-05-16 DIAGNOSIS — M545 Low back pain, unspecified: Secondary | ICD-10-CM

## 2017-05-16 NOTE — Therapy (Signed)
[place], [place], [address] Phone: [phone]   Fax:  [phone]  Physical Therapy Treatment  Patient Details  Name: Barbara Mcmahon MRN: [id number] Date of Birth: 01-13-1976 Referring Provider: Dr Jule Economy  Encounter Date: 05/16/2017      PT End of Session - 05/16/17 1614    Visit Number 3   Number of Visits 8   Date for PT Re-Evaluation 05/29/17   PT Start Time 8527   PT Stop Time 1718   PT Time Calculation (min) 64 min      Past Medical History:  Diagnosis Date  . Abnormal Pap smear and cervical HPV (human papillomavirus)   . Generalized anxiety disorder   . IBS (irritable bowel syndrome)   . PCOS (polycystic ovarian syndrome)   . Seizure disorder [facility])     Past Surgical History:  Procedure Laterality Date  . CHOLECYSTECTOMY    . COLPOSCOPY    . DILATION AND CURETTAGE OF UTERUS    . GYNECOLOGIC CRYOSURGERY    . KNEE SURGERY  right    There were no vitals filed for this visit.      Subjective Assessment - 05/16/17 1615    Subjective Pt reports her pain is still really bad in the Rt low back and Lt calf.  Started a 12 day prednisone taper dose yesterday. Having a terrible time trying to sleep.    Patient Stated Goals not be in pain, loosen up and return prior level of exercise   Currently in Pain? Yes   Pain Score 7    Pain Location Back   Pain Orientation Right;Lower;Left  left is lower 3/10 pain   Pain Descriptors / Indicators Aching;Spasm   Pain Type Chronic pain   Pain Onset More than a month ago   Pain Frequency Constant   Aggravating Factors  lying down, sitting on plane   Pain Relieving Factors TENs, hot showers.                          [facility] Adult PT Treatment/Exercise - 05/16/17 0001      Lumbar Exercises: Aerobic   Stationary Bike nustep L5x5'  SKTC stretch and childs pose     Modalities   Modalities Electrical Stimulation;Moist  Heat     Moist Heat Therapy   Number Minutes Moist Heat 15 Minutes   Moist Heat Location Lumbar Spine  buttocks     Electrical Stimulation   Electrical Stimulation Location Lt lumbar and buttocks   Electrical Stimulation Action IFC   Electrical Stimulation Parameters to tolerance   Electrical Stimulation Goals Pain;Tone     Manual Therapy   Manual Therapy Soft tissue mobilization   Soft tissue mobilization STM to lumbar paraspinals and bilat gluts, with TPR           Trigger Point Dry Needling - 05/16/17 1811    Consent Given? Yes   Education Handout Provided No   Muscles Treated Upper Body Longissimus   Muscles Treated Lower Body Gluteus minimus;Gluteus maximus   Longissimus Response Twitch response elicited;Palpable increased muscle length  bilat lumbar with stim   Gluteus Maximus Response Palpable increased muscle length;Twitch response elicited  bilat, Rt tighter than Lt   Gluteus Minimus Response Palpable increased muscle length;Twitch response elicited  bilat              PT Education - 05/16/17 1633    Education provided (P)  Yes  Education Details (P)  HEP progression   Person(s) Educated (P)  Patient   Methods (P)  Explanation;Demonstration;Handout   Comprehension (P)  Returned demonstration;Verbalized understanding             PT Long Term Goals - 05/16/17 1620      PT LONG TERM GOAL #1   Title I with advanced HEP to include yoga and/or pilates    Status On-going     PT LONG TERM GOAL #2   Title report =/> 75% reduction of low back pain when she is traveling    Status On-going     PT LONG TERM GOAL #3   Title perform core work with good stability and no reports of back pain    Status On-going     PT LONG TERM GOAL #4   Title demo full pain free lumbar ROM   Status On-going     PT LONG TERM GOAL #5   Title improve FOTO =/< 32% limited    Status On-going               Plan - 05/16/17 1816    Clinical Impression Statement  Barbara Mcmahon had a flare up of her pain on the Rt low back and some on the left. She has started taking prednisone to see if this will settle things down.  She tolerated manual therapy well and left the session in less pain.  She has not met her goals, all her travel with prolonged sitting on airplanes has caused it to flare up.    Rehab Potential Good   PT Frequency 2x / week   PT Duration 4 weeks   PT Treatment/Interventions Moist Heat;Traction;Ultrasound;Therapeutic exercise;Dry needling;Taping;Manual techniques;Neuromuscular re-education;Cryotherapy;Electrical Stimulation;Iontophoresis 65m/ml Dexamethasone;Patient/family education   PT Next Visit Plan assess response to DN/manual work and assess tolerance to initial core work.    Consulted and Agree with Plan of Care Patient      Patient will benefit from skilled therapeutic intervention in order to improve the following deficits and impairments:  Decreased range of motion, Obesity, Increased muscle spasms, Pain, Decreased strength  Visit Diagnosis: Acute right-sided low back pain without sciatica  Muscle weakness (generalized)  Muscle spasm of back     Problem List Patient Active Problem List   Diagnosis Date Noted  . Lumbar spondylosis 04/16/2017  . GAD (generalized anxiety disorder) 04/08/2017  . Shrimp allergy 09/24/2016  . 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 MRSA (methicillin resistant Staphylococcus aureus) 01/14/2015  . Family history of hypertrophic cardiomyopathy 04/23/2013  . Raynaud disease 11/19/2012  . Irritable bowel syndrome with diarrhea (colonoscopy 2007) 07/04/2012  . Obesity, morbid (more than 100 lbs over ideal weight or BMI > 40) ([facility] 06/30/2012  . Anxiety 06/30/2012  . PCOS (polycystic ovarian syndrome) 06/30/2012    SJeral PinchPT  05/16/2017, 6:19 PM  [facility] [address]Phone: [phone]  Fax:   [phone] Name: CGalena LogieMRN: [id number] of Birth: [birth year]

## 2017-05-16 NOTE — Patient Instructions (Addendum)
Bridging    Slowly raise buttocks from floor, keeping stomach tight. Repeat __10__ times per set. Do __2-3__ sets per session. Do _1___ sessions per day.  Abdominal Bracing With Pelvic Floor (Hook-Lying)   With neutral spine, tighten pelvic floor and abdominals. Hold 5 seconds. Repeat _5-_10_ times. Do _1__ times a day.  Knee to Chest: Transverse Plane Stability   Bring one knee up, then return. Be sure pelvis does not roll side to side. Keep pelvis still. Lift knee __10_ times each leg. Restabilize pelvis. Repeat with other leg. Do _1-2__ sets, _1__ times per day.  Hip External Rotation With Pillow: Transverse Plane Stability   One knee bent, one leg straight, on pillow. Slowly roll bent knee out. Be sure pelvis does not rotate. Do _10__ times. Restabilize pelvis. Repeat with other leg. Do _1-2__ sets, _1__ times per day.    [facility] [address], [place] [address]  [phone] (office) [phone] (fax)

## 2017-05-17 ENCOUNTER — Ambulatory Visit (INDEPENDENT_AMBULATORY_CARE_PROVIDER_SITE_OTHER): Payer: BLUE CROSS/BLUE SHIELD | Admitting: Sports Medicine

## 2017-05-17 DIAGNOSIS — M47816 Spondylosis without myelopathy or radiculopathy, lumbar region: Secondary | ICD-10-CM

## 2017-05-17 DIAGNOSIS — J301 Allergic rhinitis due to pollen: Secondary | ICD-10-CM | POA: Diagnosis not present

## 2017-05-17 NOTE — Progress Notes (Addendum)
  Subjective:    CC: Follow-up  HPI: This is a pleasant 41 year old female, she saw me for back pain, she's had greater than 6 weeks of physician directed conservative measures, unfortunately has persistent back pain, axial, facetogenic, better with flexion, no bowel or bladder dysfunction, saddle numbness, constitutional symptoms but persistence of pain in spite of treatment. She is agreeable to proceed intervention.  Morbid obesity: Went through [facility], they told her her insurance would not cover bariatric surgery, I would like her to get second opinion from [facility] [place] [facility].  Past medical history:  Negative.  See flowsheet/record as well for more information.  Surgical history: Negative.  See flowsheet/record as well for more information.  Family history: Negative.  See flowsheet/record as well for more information.  Social history: Negative.  See flowsheet/record as well for more information.  Allergies, and medications have been entered into the medical record, reviewed, and no changes needed.   Review of Systems: No fevers, chills, night sweats, weight loss, chest pain, or shortness of breath.   Objective:    General: Well Developed, well nourished, and in no acute distress.  Neuro: Alert and oriented x3, extra-ocular muscles intact, sensation grossly intact.  HEENT: Normocephalic, atraumatic, pupils equal round reactive to light, neck supple, no masses, no lymphadenopathy, thyroid nonpalpable.  Skin: Warm and dry, no rashes. Cardiac: Regular rate and rhythm, no murmurs rubs or gallops, no lower extremity edema.  Respiratory: Clear to auscultation bilaterally. Not using accessory muscles, speaking in full sentences.  Impression and Recommendations:    Lumbar spondylosis Persistent pain, x-rays did show L5-S1 degenerative disc disease and facet arthritis. Pain does sound more facetogenic versus lumbar spinal stenosis. At this point she's failed greater  than 6 weeks of physician directed rehabilitation exercises, steroids, NSAIDs. We are going to proceed with an MRI for interventional planning.  Return to see me to go over MRI results.  Orders placed for bilateral L4-S1 facet joint injections. Return in one month after injection to evaluate response.  Obesity, morbid (more than 100 lbs over ideal weight or BMI > 40) (HCC) There seems that there are some issues with [facility] in getting surgery covered. I am going to get a second opinion from [facility] [place] [facility] for consideration of gastric sleeve which would probably cure many of her medical and orthopedic issues. She has tried multiple anorexigenic weight loss medications with failures. Has tried dieting, calorie counting. Has had an exercise prescription.  I spent 25 minutes with this patient, greater than 50% was face-to-face time counseling regarding the above diagnoses

## 2017-05-17 NOTE — Assessment & Plan Note (Addendum)
Persistent pain, x-rays did show L5-S1 degenerative disc disease and facet arthritis. Pain does sound more facetogenic versus lumbar spinal stenosis. At this point she's failed greater than 6 weeks of physician directed rehabilitation exercises, steroids, NSAIDs. We are going to proceed with an MRI for interventional planning.  Return to see me to go over MRI results.  Orders placed for bilateral L4-S1 facet joint injections. Return in one month after injection to evaluate response.

## 2017-05-17 NOTE — Assessment & Plan Note (Signed)
There seems that there are some issues with [facility] in getting surgery covered. I am going to get a second opinion from [facility] [place] [facility] for consideration of gastric sleeve which would probably cure many of her medical and orthopedic issues. She has tried multiple anorexigenic weight loss medications with failures. Has tried dieting, calorie counting. Has had an exercise prescription.

## 2017-05-23 ENCOUNTER — Encounter: Payer: Self-pay | Admitting: Physical Therapy

## 2017-05-23 ENCOUNTER — Ambulatory Visit (INDEPENDENT_AMBULATORY_CARE_PROVIDER_SITE_OTHER): Payer: BLUE CROSS/BLUE SHIELD | Admitting: Physical Therapy

## 2017-05-23 DIAGNOSIS — M6281 Muscle weakness (generalized): Secondary | ICD-10-CM | POA: Diagnosis not present

## 2017-05-23 DIAGNOSIS — M62830 Muscle spasm of back: Secondary | ICD-10-CM

## 2017-05-23 DIAGNOSIS — M545 Low back pain, unspecified: Secondary | ICD-10-CM

## 2017-05-23 NOTE — Patient Instructions (Addendum)
Pelvic Press     Place hands under belly between navel and pubic bone, palms up. Feel pressure on hands. Increase pressure on hands by pressing pelvis down. This is NOT a pelvic tilt. Hold __5_ seconds. Relax. Repeat _5__ times. Everyother day.  KNEE: Flexion - Prone - keep hands under pelvis to feel for equal press    Hold pelvic press. Bend knee, then return the foot down. Repeat on opposite leg. Do not raise hips. _5-10__ reps per set. When this is mastered, pull both heels up at same time, x 10 reps.  Every other day  Leg Lift: One-Leg   Press pelvis down. Keep knee straight; lengthen and lift one leg (from waist). Do not twist body. Keep other leg down. Hold _1__ seconds. Relax. Repeat  5-10 time. Repeat with other leg.  HIP: Extension / KNEE: Flexion - Prone - hands under hips to feel for equal pressure.  - if develop pain with this one hold for a week and then try again.     Hold pelvic press. Bend knee, squeeze glutes. Raise leg up  5-10___ reps per set, _1__ sets per day, every other day.   Axial Extension- Upper body sequence * always start with pelvic press    Lie on stomach with forehead resting on floor and arms at sides. Tuck chin in and raise head from floor without bending it up or down. Repeat __5-_10_ times per set. Do __1__ sets per session. Do _1___ sessions per day.  Progression:  Arms at side Arms in T shape Arms in W shape  Arms in M shape Arms in Y shape  Flat Bicycle    IF you develop back pain, take a day off and perform the initial exercise for a couple of days. Return to these     Lie on back, legs toward ceiling. Flattening the stomach, begin pedaling with feet flexed. Keep the motion smooth and even, inhale __1__ circles, exhale __1_ circles. Repeat _10___ times. Pedal _10___ circles in reverse. Do __1__ sessions per day.   The Hundred    Lie on back, legs up, bent, arms toward ceiling. Exhale, pressing arms down to sides, curling up head  and upper torso. Hold. Pump arms in small flutters up and down. ___5_ pumps per inhale, _5___ pumps per exhale. Repeat ___10_ times. Do __1__ sessions per day.  Outer Hip Stretch: Reclined IT Band Stretch (Strap)    Strap around opposite foot, pull across only as far as possible with shoulders on mat. Hold for __30-45__ secs. Repeat _2___ times each leg.   Stretch out strap, either from [employer] or [email or web address]

## 2017-05-23 NOTE — Therapy (Addendum)
[facility] [place], [place], [address] Phone: [phone]   Fax:  [phone]  Physical Therapy Treatment  Patient Details  Name: Barbara Mcmahon MRN: [id number] Date of Birth: [birth year] Referring Provider: Dr Dianah Field  Encounter Date: 05/23/2017      PT End of Session - 05/23/17 1621    Visit Number 4   Number of Visits 8   Date for PT Re-Evaluation 05/29/17   PT Start Time 5852   PT Stop Time 1714   PT Time Calculation (min) 53 min   Activity Tolerance Patient tolerated treatment well      Past Medical History:  Diagnosis Date  . Abnormal Pap smear and cervical HPV (human papillomavirus)   . Generalized anxiety disorder   . IBS (irritable bowel syndrome)   . PCOS (polycystic ovarian syndrome)   . Seizure disorder [facility])     Past Surgical History:  Procedure Laterality Date  . CHOLECYSTECTOMY    . COLPOSCOPY    . DILATION AND CURETTAGE OF UTERUS    . GYNECOLOGIC CRYOSURGERY    . KNEE SURGERY  right    There were no vitals filed for this visit.      Subjective Assessment - 05/23/17 1621    Subjective Still on the prednisone, She reports she is loosening up with the exercises and medication. She really likes the strengthening aspect of the exercise. No more pain in the left calf since she started the trigger point release using ball.    Patient Stated Goals not be in pain, loosen up and return prior level of exercise   Currently in Pain? Yes   Pain Score 4    Pain Location Back   Pain Orientation Right;Left;Lower   Pain Descriptors / Indicators Throbbing;Dull   Pain Type Chronic pain   Pain Radiating Towards not into the leg anymore.    Pain Onset More than a month ago   Pain Frequency Constant   Aggravating Factors  lying down and prolonged sitting   Pain Relieving Factors TENs, exercise.             [facility] PT Assessment - 05/23/17 0001      Assessment   Medical Diagnosis  lumbar spondylosis   Referring Provider Dr Dianah Field   Onset Date/Surgical Date 11/01/16   Hand Dominance Right     AROM   AROM Assessment Site Lumbar   Lumbar Flexion 2" from top of shoes   Lumbar Extension 75% present, with pain at endrange   Lumbar - Right Side Bend WNL   Lumbar - Left Side Bend WNL   Lumbar - Right Rotation WNL   Lumbar - Left Rotation WNL     Strength   Right Hip Extension --  5-/5   Left Hip Extension 4+/5                     [facility] Adult PT Treatment/Exercise - 05/23/17 0001      Lumbar Exercises: Stretches   Passive Hamstring Stretch 30 seconds  with strap each side    Passive Hamstring Stretch Limitations 30 sec hip flexor stretch off EOB  - some tension in low back with this.    ITB Stretch 30 seconds  cross body with strap     Lumbar Exercises: Aerobic   Stationary Bike nustep L5x6'     Lumbar Exercises: Supine   Other Supine Lumbar Exercises pilates table top ex,- cycling and 100's  Lumbar Exercises: Prone   Other Prone Lumbar Exercises 5 reps pelvic press series     Modalities   Modalities Electrical Stimulation;Cryotherapy     Cryotherapy   Number Minutes Cryotherapy 15 Minutes   Cryotherapy Location Lumbar Spine   Type of Cryotherapy Ice pack     Electrical Stimulation   Electrical Stimulation Location bilat lumbar   Electrical Stimulation Action IFC   Electrical Stimulation Parameters to tolerance   Electrical Stimulation Goals Pain;Tone                PT Education - 05/23/17 1646    Education provided Yes   Education Details HEP progression   Person(s) Educated Patient   Methods Explanation;Demonstration;Handout   Comprehension Returned demonstration;Verbalized understanding             PT Long Term Goals - 05/23/17 1659      PT LONG TERM GOAL #1   Title I with advanced HEP to include yoga and/or pilates    Status On-going     PT LONG TERM GOAL #2   Title report =/> 75% reduction of  low back pain when she is traveling    Status On-going     PT LONG TERM GOAL #3   Title perform core work with good stability and no reports of back pain    Status On-going     PT LONG TERM GOAL #4   Title demo full pain free lumbar ROM   Status Partially Met     PT LONG TERM GOAL #5   Title improve FOTO =/< 32% limited    Status On-going               Plan - 05/23/17 1659    Clinical Impression Statement Pt is making progress, she has met a goal and progressing to the others.  She responds very well to HEP for strengthening.  Schedule to have MRI at end of month and has about a week left on prednisone.    Rehab Potential Good   PT Frequency 2x / week   PT Duration 4 weeks   PT Treatment/Interventions Moist Heat;Traction;Ultrasound;Therapeutic exercise;Dry needling;Taping;Manual techniques;Neuromuscular re-education;Cryotherapy;Electrical Stimulation;Iontophoresis 38m/ml Dexamethasone;Patient/family education   PT Next Visit Plan review HEP progress as able.    Consulted and Agree with Plan of Care Patient      Patient will benefit from skilled therapeutic intervention in order to improve the following deficits and impairments:  Decreased range of motion, Obesity, Increased muscle spasms, Pain, Decreased strength  Visit Diagnosis: Acute right-sided low back pain without sciatica  Muscle weakness (generalized)  Muscle spasm of back     Problem List Patient Active Problem List   Diagnosis Date Noted  . Lumbar spondylosis 04/16/2017  . GAD (generalized anxiety disorder) 04/08/2017  . Shrimp allergy 09/24/2016  . 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 MRSA (methicillin resistant Staphylococcus aureus) 01/14/2015  . Family history of hypertrophic cardiomyopathy 04/23/2013  . Raynaud disease 11/19/2012  . Irritable bowel syndrome with diarrhea (colonoscopy 2007) 07/04/2012  . Obesity, morbid (more than 100 lbs over ideal weight or BMI > 40) [place] 06/30/2012  . Anxiety  06/30/2012  . PCOS (polycystic ovarian syndrome) 06/30/2012    SJeral PinchPT  05/23/2017, 5:01 PM  [facility] [address] [place] [address]Phone: [phone]  Fax:  [phone] Name: Barbara FloridoMRN: [id number] of Birth: 411/01/1976  PHYSICAL THERAPY DISCHARGE SUMMARY  Visits from Start of  Care: 4  Current functional level related to goals / functional outcomes: unknown   Remaining deficits: unknown   Education / Equipment: Initial HEP Plan:                                                    Patient goals were partially met. Patient is being discharged due to not returning since the last visit.  ?????   Jeral Pinch, PT 06/24/17 8:51 AM

## 2017-05-24 DIAGNOSIS — J301 Allergic rhinitis due to pollen: Secondary | ICD-10-CM | POA: Diagnosis not present

## 2017-05-30 ENCOUNTER — Encounter: Payer: Self-pay | Admitting: Physical Therapy

## 2017-05-31 DIAGNOSIS — J301 Allergic rhinitis due to pollen: Secondary | ICD-10-CM | POA: Diagnosis not present

## 2017-06-03 ENCOUNTER — Ambulatory Visit (INDEPENDENT_AMBULATORY_CARE_PROVIDER_SITE_OTHER): Payer: BLUE CROSS/BLUE SHIELD

## 2017-06-03 DIAGNOSIS — M47816 Spondylosis without myelopathy or radiculopathy, lumbar region: Secondary | ICD-10-CM | POA: Diagnosis not present

## 2017-06-03 DIAGNOSIS — M545 Low back pain: Secondary | ICD-10-CM | POA: Diagnosis not present

## 2017-06-04 NOTE — Addendum Note (Signed)
Addended by: Silverio Decamp on: 06/04/2017 10:40 AM   Modules accepted: Orders

## 2017-06-14 ENCOUNTER — Other Ambulatory Visit: Payer: Self-pay | Admitting: *Deleted

## 2017-06-14 DIAGNOSIS — J301 Allergic rhinitis due to pollen: Secondary | ICD-10-CM | POA: Diagnosis not present

## 2017-06-21 ENCOUNTER — Ambulatory Visit
Admission: RE | Admit: 2017-06-21 | Discharge: 2017-06-21 | Disposition: A | Discharge status: Home / Self Care | Payer: BLUE CROSS/BLUE SHIELD | Source: Ambulatory Visit | Attending: Sports Medicine | Admitting: Sports Medicine

## 2017-06-21 ENCOUNTER — Other Ambulatory Visit: Payer: Self-pay | Admitting: Sports Medicine

## 2017-06-21 DIAGNOSIS — M47816 Spondylosis without myelopathy or radiculopathy, lumbar region: Secondary | ICD-10-CM

## 2017-06-21 DIAGNOSIS — M47817 Spondylosis without myelopathy or radiculopathy, lumbosacral region: Secondary | ICD-10-CM | POA: Diagnosis not present

## 2017-06-21 MED ORDER — IOPAMIDOL (ISOVUE-M 200) INJECTION 41%
1.0000 mL | Freq: Once | INTRAMUSCULAR | Status: AC
  Administered 2017-06-21: 1 mL via INTRA_ARTICULAR

## 2017-06-21 MED ORDER — METHYLPREDNISOLONE ACETATE 40 MG/ML INJ SUSP (RADIOLOG
120.0000 mg | Freq: Once | INTRAMUSCULAR | Status: AC
  Administered 2017-06-21: 120 mg via INTRA_ARTICULAR

## 2017-06-21 NOTE — Discharge Instructions (Signed)

## 2017-06-22 ENCOUNTER — Other Ambulatory Visit: Payer: Self-pay | Admitting: Family Medicine

## 2017-06-22 DIAGNOSIS — F419 Anxiety disorder, unspecified: Secondary | ICD-10-CM

## 2017-06-28 DIAGNOSIS — J301 Allergic rhinitis due to pollen: Secondary | ICD-10-CM | POA: Diagnosis not present

## 2017-07-03 ENCOUNTER — Telehealth: Payer: Self-pay | Admitting: Family Medicine

## 2017-07-03 NOTE — Telephone Encounter (Signed)
Called pt and left a message for her to call and schedule a F/u appt with Dr.Metheney  The f/u would for mood

## 2017-07-04 ENCOUNTER — Ambulatory Visit (INDEPENDENT_AMBULATORY_CARE_PROVIDER_SITE_OTHER): Payer: BLUE CROSS/BLUE SHIELD

## 2017-07-04 ENCOUNTER — Ambulatory Visit (INDEPENDENT_AMBULATORY_CARE_PROVIDER_SITE_OTHER): Payer: BLUE CROSS/BLUE SHIELD | Admitting: Sports Medicine

## 2017-07-04 ENCOUNTER — Ambulatory Visit (HOSPITAL_BASED_OUTPATIENT_CLINIC_OR_DEPARTMENT_OTHER)
Admission: RE | Admit: 2017-07-04 | Discharge: 2017-07-04 | Disposition: A | Discharge status: Home / Self Care | Payer: BLUE CROSS/BLUE SHIELD | Source: Ambulatory Visit | Attending: Sports Medicine | Admitting: Sports Medicine

## 2017-07-04 DIAGNOSIS — R0602 Shortness of breath: Secondary | ICD-10-CM

## 2017-07-04 DIAGNOSIS — J9811 Atelectasis: Secondary | ICD-10-CM | POA: Diagnosis not present

## 2017-07-04 DIAGNOSIS — Z86711 Personal history of pulmonary embolism: Secondary | ICD-10-CM

## 2017-07-04 DIAGNOSIS — I2699 Other pulmonary embolism without acute cor pulmonale: Secondary | ICD-10-CM | POA: Diagnosis not present

## 2017-07-04 HISTORY — DX: Personal history of pulmonary embolism: Z86.711

## 2017-07-04 LAB — CBC WITH DIFFERENTIAL/PLATELET
Basophils Absolute: 72 cells/uL (ref 0–200)
Basophils Relative: 0.5 %
Eosinophils Absolute: 86 cells/uL (ref 15–500)
Eosinophils Relative: 0.6 %
HCT: 36.7 % (ref 35.0–45.0)
Hemoglobin: 12.7 g/dL (ref 11.7–15.5)
Lymphs Abs: 3604 cells/uL (ref 850–3900)
MCH: 30.1 pg (ref 27.0–33.0)
MCHC: 34.6 g/dL (ref 32.0–36.0)
MCV: 87 fL (ref 80.0–100.0)
MPV: 10.5 fL (ref 7.5–12.5)
Monocytes Relative: 6.9 %
Neutro Abs: 9552 {cells}/uL — ABNORMAL HIGH (ref 1500–7800)
Neutrophils Relative %: 66.8 %
Platelets: 279 10*3/uL (ref 140–400)
RBC: 4.22 Million/uL (ref 3.80–5.10)
RDW: 13.1 % (ref 11.0–15.0)
Total Lymphocyte: 25.2 %
WBC mixed population: 987 cells/uL — ABNORMAL HIGH (ref 200–950)
WBC: 14.3 Thousand/uL — ABNORMAL HIGH (ref 3.8–10.8)

## 2017-07-04 LAB — BASIC METABOLIC PANEL WITH GFR
CO2: 26 mmol/L (ref 20–32)
Calcium: 8.4 mg/dL — ABNORMAL LOW (ref 8.6–10.2)
Glucose, Bld: 99 mg/dL (ref 65–99)

## 2017-07-04 LAB — BASIC METABOLIC PANEL
BUN: 12 mg/dL (ref 7–25)
Chloride: 107 mmol/L (ref 98–110)
Creat: 0.65 mg/dL (ref 0.50–1.10)
Potassium: 3.9 mmol/L (ref 3.5–5.3)
Sodium: 140 mmol/L (ref 135–146)

## 2017-07-04 LAB — D-DIMER, QUANTITATIVE: D-Dimer, Quant: 1.87 ug{FEU}/mL — ABNORMAL HIGH (ref <0.50)

## 2017-07-04 MED ORDER — AMOXICILLIN-POT CLAVULANATE 875-125 MG PO TABS: 1.0000 | ORAL_TABLET | Freq: Two times a day (BID) | ORAL | 0 refills | Status: DC

## 2017-07-04 MED ORDER — APIXABAN 5 MG PO TABS: ORAL_TABLET | ORAL | 6 refills | Status: DC

## 2017-07-04 MED ORDER — IOPAMIDOL (ISOVUE-370) INJECTION 76%
100.0000 mL | Freq: Once | INTRAVENOUS | Status: AC | PRN
  Administered 2017-07-04: 100 mL via INTRAVENOUS

## 2017-07-04 MED ORDER — FLUTICASONE PROPIONATE 50 MCG/ACT NA SUSP: NASAL | 3 refills | Status: DC

## 2017-07-04 NOTE — Assessment & Plan Note (Addendum)
With maxillary sinus symptoms, adding Augmentin, Flonase. Course sounds in the left lower lobe, chest x-ray.  Because she's had multiple trips, as well as pleuritic chest pain and shortness of breath I am going to add a d-dimer.  D-dimer elevated, stat CT angiogram ordered. [facility] [place].  CT angio positive, large left PE, suspect right heart strain.  Adding Eliquis tonight.  Discussed treatment on the phone with patient, she will start Eliquis tonight and see me tomorrow. She was clinically stable and neither tachycardic nor tachypneic nor hypoxic so I'm not going to immediately hospitalize her.  I would like her to come see me tomorrow for a 12 lead ECG.  I will look for evidence of right heart strain on her ECG tomorrow (new RBBB, ST depression in the right leads, or S1Q3T3 pattern). I anticipate 6 months of treatment for provoked PE.

## 2017-07-04 NOTE — Addendum Note (Signed)
Addended by: Silverio Decamp on: 07/04/2017 04:51 PM   Modules accepted: Orders

## 2017-07-04 NOTE — Progress Notes (Addendum)
  Subjective:    CC: shortness of breath  HPI: This is a pleasant 41 year old female, for the past several days she has had increasing shortness of breath, pleuritic chest pain, sinus pain and pressure. Moderate, persistent. She has been traveling to multiple airports. No history of blood clots, leg swelling.  Past medical history:  Negative.  See flowsheet/record as well for more information.  Surgical history: Negative.  See flowsheet/record as well for more information.  Family history: Negative.  See flowsheet/record as well for more information.  Social history: Negative.  See flowsheet/record as well for more information.  Allergies, and medications have been entered into the medical record, reviewed, and no changes needed.   Review of Systems: No fevers, chills, night sweats, weight loss, chest pain, or shortness of breath.   Objective:    General: Well Developed, well nourished, and in no acute distress.  Neuro: Alert and oriented x3, extra-ocular muscles intact, sensation grossly intact.  HEENT: Normocephalic, atraumatic, pupils equal round reactive to light, neck supple, no masses, no lymphadenopathy, thyroid nonpalpable. Oropharynx, nasopharynx, ear canals unremarkable, tender to palpation over the left maxillary sinus Skin: Warm and dry, no rashes. Cardiac: Regular rate and rhythm, no murmurs rubs or gallops, no lower extremity edema.  Respiratory: coarse breath sounds in the left base. Not using accessory muscles, speaking in full sentences.  Impression and Recommendations:    Pulmonary embolism (HCC) With maxillary sinus symptoms, adding Augmentin, Flonase. Course sounds in the left lower lobe, chest x-ray.  Because she's had multiple trips, as well as pleuritic chest pain and shortness of breath I am going to add a d-dimer.  D-dimer elevated, stat CT angiogram ordered. [facility] [place].  CT angio positive, large left PE, suspect right heart strain.  Adding Eliquis  tonight.  Discussed treatment on the phone with patient, she will start Eliquis tonight and see me tomorrow. She was clinically stable and neither tachycardic nor tachypneic nor hypoxic so I'm not going to immediately hospitalize her.  I would like her to come see me tomorrow for a 12 lead ECG.  I will look for evidence of right heart strain on her ECG tomorrow (new RBBB, ST depression in the right leads, or S1Q3T3 pattern). I anticipate 6 months of treatment for provoked PE.  I spent 25 minutes with this patient, greater than 50% was face-to-face time counseling regarding the above diagnoses ___________________________________________ Gwen Her. Dianah Field, M.D., ABFM., CAQSM. Primary Care and [place] Instructor of [employer] [facility] of Medicine

## 2017-07-04 NOTE — Addendum Note (Signed)
Addended by: Silverio Decamp on: 07/04/2017 07:40 PM   Modules accepted: Orders

## 2017-07-05 ENCOUNTER — Encounter: Payer: Self-pay | Admitting: Sports Medicine

## 2017-07-05 ENCOUNTER — Ambulatory Visit (INDEPENDENT_AMBULATORY_CARE_PROVIDER_SITE_OTHER): Payer: BLUE CROSS/BLUE SHIELD | Admitting: Sports Medicine

## 2017-07-05 VITALS — BP 138/86 | HR 84

## 2017-07-05 DIAGNOSIS — I2699 Other pulmonary embolism without acute cor pulmonale: Secondary | ICD-10-CM | POA: Diagnosis not present

## 2017-07-05 NOTE — Progress Notes (Signed)
Pt was diagnosed yesterday with a PE. Per Provider review, she was advised to come into clinic today for an EKG. Results gone over with Physician in clinic today.

## 2017-07-05 NOTE — Assessment & Plan Note (Signed)
Barbara Mcmahon has a large left pulmonary embolism, there was some evidence of right heart strain on the CT scan but clinically she was stable with minimal symptoms. I brought her back today to obtain an ECG looking for signs of right heart strain, a previous ECG in 2014 was normal with the exception of right axis deviation, today right axis deviation was also noted without ST changes, no S1Q3T3 pattern, no tachycardia.  Thus there are no new changes to suggest right heart strain/cor pulmonale. She has started her Eliquis. We are stopping her birth control which is now contraindicated, she will discuss further management of her endometriosis with her gynecologist and new treatments such as [name]. I anticipate 4-6 months of treatment with Eliquis, she can follow-up further treatment with her PCP.

## 2017-07-05 NOTE — Progress Notes (Signed)
   I spent a great deal of time with this patient describing what happened, what likely caused it, and the pathophysiologic cascade/virchow's triad that results in blood clots.  I spent 25 minutes with this patient, greater than 50% was face-to-face time counseling regarding the above diagnoses

## 2017-07-06 ENCOUNTER — Encounter: Payer: Self-pay | Admitting: Sports Medicine

## 2017-07-07 ENCOUNTER — Encounter: Payer: Self-pay | Admitting: Sports Medicine

## 2017-07-08 ENCOUNTER — Other Ambulatory Visit: Payer: Self-pay | Admitting: Sports Medicine

## 2017-07-08 MED ORDER — FLUCONAZOLE 150 MG PO TABS: 150.0000 mg | ORAL_TABLET | Freq: Once | ORAL | 0 refills | Status: AC

## 2017-07-10 DIAGNOSIS — I2699 Other pulmonary embolism without acute cor pulmonale: Secondary | ICD-10-CM | POA: Diagnosis not present

## 2017-07-14 ENCOUNTER — Encounter: Payer: Self-pay | Admitting: Family Medicine

## 2017-07-15 ENCOUNTER — Encounter: Payer: Self-pay | Admitting: Family Medicine

## 2017-07-15 ENCOUNTER — Ambulatory Visit (INDEPENDENT_AMBULATORY_CARE_PROVIDER_SITE_OTHER): Payer: BLUE CROSS/BLUE SHIELD | Admitting: Family Medicine

## 2017-07-15 VITALS — BP 118/82 | HR 100 | Ht 65.0 in | Wt 283.0 lb

## 2017-07-15 DIAGNOSIS — I2699 Other pulmonary embolism without acute cor pulmonale: Secondary | ICD-10-CM

## 2017-07-15 DIAGNOSIS — Z23 Encounter for immunization: Secondary | ICD-10-CM | POA: Diagnosis not present

## 2017-07-15 DIAGNOSIS — R0602 Shortness of breath: Secondary | ICD-10-CM

## 2017-07-15 DIAGNOSIS — I7300 Raynaud's syndrome without gangrene: Secondary | ICD-10-CM | POA: Diagnosis not present

## 2017-07-15 DIAGNOSIS — F419 Anxiety disorder, unspecified: Secondary | ICD-10-CM

## 2017-07-15 NOTE — Progress Notes (Signed)
Subjective:    Patient ID: Barbara Mcmahon, female    DOB: 06/28/1976, 41 y.o.   MRN: [id number]  HPI F/U anxiety/Stress - He is been a little bit more stress recently with his new diagnosis but otherwise is doing well.She just feels like she never really relaxes. That she does sometimes read for enjoyment. She says she has a hard time sitting through movie because she feels that she has to doing multiple tasks like folding laundry etc. That she doesn't like the behaviors interfere with her daily life or work. Or cause relationship problems.  F/U PE - will tx for 6 months.  She still having some mild chest pain as well as some shortness of breath with activities. She is taking her Eliquis regularly and has been on it for about a week now. She did go see her OB/GYN. She is fully off her birth control and they actually will be putting an IUD later this week. Barbara Mcmahon, her OB/GYN who recommended a referral to Barbara Mcmahon with hematology oncology for consultation. I suspect her pulmonary is embolism is from a combination of birth control, flying in an airplane, and dehydration.  She does feel like her and it has been worse a little bit since her pulmonary embolism.   Review of Systems  BP 118/82   Pulse 100   Ht 5\' 5"  (1.651 m)   Wt 283 lb (128.4 kg)   SpO2 98%   BMI 47.09 kg/m     Allergies  Allergen Reactions  . Shrimp [Shellfish Allergy] Hives and Swelling    Throat closes  . Codeine Hives and Itching    Pt says she can take percocet  . Cinnamon   . Morphine And Related Other (See Comments)    Big mood changes  . Robitussin A-C [Guaifenesin-Codeine] Itching  . Yeast-Related Products Hives    Past Medical History:  Diagnosis Date  . Abnormal Pap smear and cervical HPV (human papillomavirus)   . Generalized anxiety disorder   . IBS (irritable bowel syndrome)   . PCOS (polycystic ovarian syndrome)   . Seizure disorder [facility])     Past Surgical History:  Procedure  Laterality Date  . CHOLECYSTECTOMY    . COLPOSCOPY    . DILATION AND CURETTAGE OF UTERUS    . GYNECOLOGIC CRYOSURGERY    . KNEE SURGERY  right    Social History   Social History  . Marital status: Married    Spouse name: N/A  . Number of children: N/A  . Years of education: N/A   Occupational History  . Engineering geologist.     Social History Main Topics  . Smoking status: Never Smoker  . Smokeless tobacco: Never Used  . Alcohol use No  . Drug use: No  . Sexual activity: Not Currently   Other Topics Concern  . Not on file   Social History Narrative  . No narrative on file    Family History  Problem Relation Age of Onset  . Hypertension Father   . Diabetes Paternal Grandmother   . Hypertension Paternal Grandmother   . Stroke Paternal Grandmother   . Diabetes Paternal Grandfather   . Hypertension Paternal Grandfather   . Stroke Paternal Grandfather   . Hypertension Mother     Outpatient Encounter Prescriptions as of 07/15/2017  Medication Sig  . apixaban (ELIQUIS) 5 MG TABS tablet 10mg  PO BID for 1 week then 5mg  PO BID x6 months.  . cetirizine (ZYRTEC) 10 MG tablet Take  10 mg by mouth daily.  Marland Kitchen EPINEPHrine (EPI-PEN) 0.3 mg/0.3 mL SOAJ injection Inject 0.3 mLs (0.3 mg total) into the muscle once as needed (anaphylaxis).  . fluticasone (FLONASE) 50 MCG/ACT nasal spray One spray in each nostril twice a day, use left hand for right nostril, and right hand for left nostril.  Marland Kitchen venlafaxine XR (EFFEXOR-XR) 75 MG 24 hr capsule TAKE 1 CAPSULE (75 MG TOTAL) BY MOUTH DAILY WITH BREAKFAST. MUST KEEP APPT   No facility-administered encounter medications on file as of 07/15/2017.           Objective:   Physical Exam  Constitutional: She is oriented to person, place, and time. She appears well-developed and well-nourished.  HENT:  Head: Normocephalic and atraumatic.  Cardiovascular: Normal rate, regular rhythm and normal heart sounds.   Pulmonary/Chest: Effort normal and  breath sounds normal.  Neurological: She is alert and oriented to person, place, and time.  Skin: Skin is warm and dry.  Psychiatric: She has a normal mood and affect. Her behavior is normal.          Assessment & Plan:  PE - Her sxs can actually take a couple of months for her symptoms to completely resolve and to be patient and give her body time to heal. Encouraged her to start being as active as she feels she is able to be. 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 She is still felt just extremely fatigued. Continue with Eliquis and the plan will be to continue that for 4-6 months. Now off birth control.  See her back in January and will plan to place referra to Dr. Dr. Marin Mcmahon at that time.  I did recommend that she avoid flying at this time. But will check into more specifics and she does travel a lot for her job.  Anxiety/Stress- GAD 7 score of 4 today. We did discuss that some people are just more active and multitask. As long as it's not disruptive to relationships or to [place] activities and is not necessarily something that's worrisome or needs to be treated. We discussed options of continuing with her current dose of Effexor or actually adjusting her dose. She would like to continue with her current regimen.  Raynauds - she feels like her raynauds has been more bother since dx with the PE.  Discussed strategies around this for the winter months.   Could consider a calcium channel blocker .  She is on blood thinners so often people feel more cold intolerant.

## 2017-07-16 ENCOUNTER — Encounter: Payer: Self-pay | Admitting: Family Medicine

## 2017-07-16 DIAGNOSIS — F419 Anxiety disorder, unspecified: Secondary | ICD-10-CM

## 2017-07-16 LAB — HM PAP SMEAR: HM Pap smear: NEGATIVE

## 2017-07-16 NOTE — Telephone Encounter (Signed)
Attempted to contact Dr.Ennever's office, they are currently closed. Will call back tomorrow.

## 2017-07-17 NOTE — Telephone Encounter (Signed)
Called and spoke with Barbara Mcmahon at Dr. Antonieta Pert office, she was able to speak with Dr. Marin Olp. Was advised if a Pt presents with an acute PE and has no cardiac issues/concerns they must be anticoagulated for 6 weeks prior to flying.

## 2017-07-18 DIAGNOSIS — J301 Allergic rhinitis due to pollen: Secondary | ICD-10-CM | POA: Diagnosis not present

## 2017-07-18 DIAGNOSIS — Z30430 Encounter for insertion of intrauterine contraceptive device: Secondary | ICD-10-CM | POA: Diagnosis not present

## 2017-07-19 ENCOUNTER — Ambulatory Visit: Payer: Self-pay | Admitting: Sports Medicine

## 2017-07-22 ENCOUNTER — Encounter: Payer: Self-pay | Admitting: Family Medicine

## 2017-07-22 MED ORDER — VENLAFAXINE HCL ER 75 MG PO CP24: 75.0000 mg | ORAL_CAPSULE | Freq: Every day | ORAL | 0 refills | Status: DC

## 2017-07-24 DIAGNOSIS — J301 Allergic rhinitis due to pollen: Secondary | ICD-10-CM | POA: Diagnosis not present

## 2017-07-25 DIAGNOSIS — Z6841 Body Mass Index (BMI) 40.0 and over, adult: Secondary | ICD-10-CM | POA: Diagnosis not present

## 2017-07-25 DIAGNOSIS — Z1231 Encounter for screening mammogram for malignant neoplasm of breast: Secondary | ICD-10-CM | POA: Diagnosis not present

## 2017-07-25 DIAGNOSIS — Z8041 Family history of malignant neoplasm of ovary: Secondary | ICD-10-CM | POA: Diagnosis not present

## 2017-07-25 DIAGNOSIS — Z803 Family history of malignant neoplasm of breast: Secondary | ICD-10-CM | POA: Diagnosis not present

## 2017-07-25 DIAGNOSIS — Z800 Family history of malignant neoplasm of digestive organs: Secondary | ICD-10-CM | POA: Diagnosis not present

## 2017-07-25 DIAGNOSIS — Z808 Family history of malignant neoplasm of other organs or systems: Secondary | ICD-10-CM | POA: Diagnosis not present

## 2017-07-25 DIAGNOSIS — Z01419 Encounter for gynecological examination (general) (routine) without abnormal findings: Secondary | ICD-10-CM | POA: Diagnosis not present

## 2017-07-29 ENCOUNTER — Encounter: Payer: Self-pay | Admitting: Sports Medicine

## 2017-07-30 ENCOUNTER — Other Ambulatory Visit: Payer: Self-pay | Admitting: Obstetrics and Gynecology

## 2017-07-30 DIAGNOSIS — R928 Other abnormal and inconclusive findings on diagnostic imaging of breast: Secondary | ICD-10-CM

## 2017-07-30 MED ORDER — CYCLOBENZAPRINE HCL 10 MG PO TABS: ORAL_TABLET | ORAL | 0 refills | Status: DC

## 2017-07-30 MED ORDER — PREDNISONE 50 MG PO TABS: ORAL_TABLET | ORAL | 0 refills | Status: DC

## 2017-07-31 MED ORDER — RIZATRIPTAN BENZOATE 5 MG PO TBDP
5.0000 mg | ORAL_TABLET | ORAL | 0 refills | Status: DC | PRN
Start: 2017-07-31 — End: 2017-10-11

## 2017-08-01 DIAGNOSIS — J301 Allergic rhinitis due to pollen: Secondary | ICD-10-CM | POA: Diagnosis not present

## 2017-08-05 ENCOUNTER — Ambulatory Visit: Payer: Self-pay

## 2017-08-05 ENCOUNTER — Ambulatory Visit
Admission: RE | Admit: 2017-08-05 | Discharge: 2017-08-05 | Disposition: A | Discharge status: Home / Self Care | Payer: BLUE CROSS/BLUE SHIELD | Source: Ambulatory Visit | Attending: Obstetrics and Gynecology | Admitting: Obstetrics and Gynecology

## 2017-08-05 DIAGNOSIS — R928 Other abnormal and inconclusive findings on diagnostic imaging of breast: Secondary | ICD-10-CM

## 2017-08-05 DIAGNOSIS — J301 Allergic rhinitis due to pollen: Secondary | ICD-10-CM | POA: Diagnosis not present

## 2017-08-12 DIAGNOSIS — J301 Allergic rhinitis due to pollen: Secondary | ICD-10-CM | POA: Diagnosis not present

## 2017-09-03 DIAGNOSIS — Z809 Family history of malignant neoplasm, unspecified: Secondary | ICD-10-CM | POA: Diagnosis not present

## 2017-09-03 DIAGNOSIS — Z30431 Encounter for routine checking of intrauterine contraceptive device: Secondary | ICD-10-CM | POA: Diagnosis not present

## 2017-09-04 DIAGNOSIS — J301 Allergic rhinitis due to pollen: Secondary | ICD-10-CM | POA: Diagnosis not present

## 2017-09-05 ENCOUNTER — Ambulatory Visit: Payer: BLUE CROSS/BLUE SHIELD | Admitting: Sports Medicine

## 2017-09-05 ENCOUNTER — Encounter: Payer: Self-pay | Admitting: Sports Medicine

## 2017-09-05 DIAGNOSIS — M47816 Spondylosis without myelopathy or radiculopathy, lumbar region: Secondary | ICD-10-CM | POA: Diagnosis not present

## 2017-09-05 MED ORDER — METHOCARBAMOL 500 MG PO TABS: 500.0000 mg | ORAL_TABLET | Freq: Three times a day (TID) | ORAL | 0 refills | Status: DC

## 2017-09-05 NOTE — Assessment & Plan Note (Signed)
Did not get any response to multilevel facet joint injections in spite of how marketed the osteoarthritis was on MRI. We are going to proceed with bilateral sacroiliac joint injections today. Return in 1 month to evaluate relief. If gets good relief we will proceed with radiofrequency ablation.

## 2017-09-05 NOTE — Progress Notes (Signed)
Subjective:    CC: Low back pain  HPI: Barbara Mcmahon returns to discuss her low back pain, she had multilevel facet joint injections, this is over a month ago, unfortunately had no relief, not even temporary, and tells me she did not have concordant pain during the injection procedure itself.  Pain is localized axial, somewhat between the L5-S1 facets and the sacroiliac joints with radiation into the buttocks.  Left worse than right.  Pulmonary embolism: Continues with her blood thinners.  No further shortness of breath or cough  Morbid obesity: Has not yet heard back from bariatric surgery.    Past medical history:  Negative.  See flowsheet/record as well for more information.  Surgical history: Negative.  See flowsheet/record as well for more information.  Family history: Negative.  See flowsheet/record as well for more information.  Social history: Negative.  See flowsheet/record as well for more information.  Allergies, and medications have been entered into the medical record, reviewed, and no changes needed.   Review of Systems: No fevers, chills, night sweats, weight loss, chest pain, or shortness of breath.   Objective:    General: Well Developed, well nourished, and in no acute distress.  Neuro: Alert and oriented x3, extra-ocular muscles intact, sensation grossly intact.  HEENT: Normocephalic, atraumatic, pupils equal round reactive to light, neck supple, no masses, no lymphadenopathy, thyroid nonpalpable.  Skin: Warm and dry, no rashes. Cardiac: Regular rate and rhythm, no murmurs rubs or gallops, no lower extremity edema.  Respiratory: Clear to auscultation bilaterally. Not using accessory muscles, speaking in full sentences. Back Exam:  Inspection: Unremarkable  Motion: Flexion 45 deg, Extension 45 deg, Side Bending to 45 deg bilaterally,  Rotation to 45 deg bilaterally  SLR laying: Negative  XSLR laying: Negative  Palpable tenderness: Bilateral SI joints. FABER:  negative. Sensory change: Gross sensation intact to all lumbar and sacral dermatomes.  Reflexes: 2+ at both patellar tendons, 2+ at achilles tendons, Babinski's downgoing.  Strength at foot  Plantar-flexion: 5/5 Dorsi-flexion: 5/5 Eversion: 5/5 Inversion: 5/5  Leg strength  Quad: 5/5 Hamstring: 5/5 Hip flexor: 5/5 Hip abductors: 5/5  Gait unremarkable.  Procedure: Real-time Ultrasound Guided Injection of left sacroiliac joint Device: GE Logiq E  Verbal informed consent obtained.  Time-out conducted.  Noted no overlying erythema, induration, or other signs of local infection.  Skin prepped in a sterile fashion.  Local anesthesia: Topical Ethyl chloride.  With sterile technique and under real time ultrasound guidance: Taking care to avoid the S1 foramen I guided a 22-gauge 3 inch spinal needle into the sacroiliac joint, I then injected 1 cc kenalog 40, 2 cc lidocaine, 2 cc bupivacaine, patient experienced concordant pain during the procedure Completed without difficulty  Pain immediately resolved suggesting accurate placement of the medication.  Advised to call if fevers/chills, erythema, induration, drainage, or persistent bleeding.  Images permanently stored and available for review in the ultrasound unit.  Impression: Technically successful ultrasound guided injection.  Procedure: Real-time Ultrasound Guided Injection of right sacroiliac joint Device: GE Logiq E  Verbal informed consent obtained.  Time-out conducted.  Noted no overlying erythema, induration, or other signs of local infection.  Skin prepped in a sterile fashion.  Local anesthesia: Topical Ethyl chloride.  With sterile technique and under real time ultrasound guidance: Taking care to avoid the S1 foramen I guided a 22-gauge 3 inch spinal needle into the sacroiliac joint, I then injected 1 cc kenalog 40, 2 cc lidocaine, 2 cc bupivacaine, patient experienced concordant pain during the  procedure Completed without  difficulty  Pain immediately resolved suggesting accurate placement of the medication.  Advised to call if fevers/chills, erythema, induration, drainage, or persistent bleeding.  Images permanently stored and available for review in the ultrasound unit.  Impression: Technically successful ultrasound guided injection.  Impression and Recommendations:    Lumbar spondylosis Did not get any response to multilevel facet joint injections in spite of how marketed the osteoarthritis was on MRI. We are going to proceed with bilateral sacroiliac joint injections today. Return in 1 month to evaluate relief. If gets good relief we will proceed with radiofrequency ablation.  ___________________________________________ Gwen Her. Dianah Field, M.D., ABFM., CAQSM. Primary Care and [place] Instructor of [place] of [facility] of Medicine

## 2017-09-13 DIAGNOSIS — J301 Allergic rhinitis due to pollen: Secondary | ICD-10-CM | POA: Diagnosis not present

## 2017-09-27 DIAGNOSIS — J301 Allergic rhinitis due to pollen: Secondary | ICD-10-CM | POA: Diagnosis not present

## 2017-10-04 DIAGNOSIS — J301 Allergic rhinitis due to pollen: Secondary | ICD-10-CM | POA: Diagnosis not present

## 2017-10-08 HISTORY — PX: BREAST BIOPSY: SHX20

## 2017-10-09 DIAGNOSIS — J301 Allergic rhinitis due to pollen: Secondary | ICD-10-CM | POA: Diagnosis not present

## 2017-10-11 ENCOUNTER — Other Ambulatory Visit: Payer: Self-pay

## 2017-10-11 ENCOUNTER — Encounter: Payer: Self-pay | Admitting: Hematology & Oncology

## 2017-10-11 ENCOUNTER — Other Ambulatory Visit (HOSPITAL_BASED_OUTPATIENT_CLINIC_OR_DEPARTMENT_OTHER): Payer: BLUE CROSS/BLUE SHIELD

## 2017-10-11 ENCOUNTER — Ambulatory Visit (HOSPITAL_BASED_OUTPATIENT_CLINIC_OR_DEPARTMENT_OTHER): Payer: BLUE CROSS/BLUE SHIELD | Admitting: Hematology & Oncology

## 2017-10-11 VITALS — BP 140/83 | HR 75 | Temp 98.2°F | Resp 20 | Ht 64.5 in | Wt 293.1 lb

## 2017-10-11 DIAGNOSIS — I2699 Other pulmonary embolism without acute cor pulmonale: Secondary | ICD-10-CM

## 2017-10-11 DIAGNOSIS — I2602 Saddle embolus of pulmonary artery with acute cor pulmonale: Secondary | ICD-10-CM | POA: Diagnosis not present

## 2017-10-11 LAB — CBC WITH DIFFERENTIAL (CANCER CENTER ONLY)
BASO#: 0 10*3/uL (ref 0.0–0.2)
BASO%: 0.3 % (ref 0.0–2.0)
EOS ABS: 0 10*3/uL (ref 0.0–0.5)
EOS%: 0.4 % (ref 0.0–7.0)
HCT: 40.9 % (ref 34.8–46.6)
HEMOGLOBIN: 13.4 g/dL (ref 11.6–15.9)
LYMPH#: 2.8 10*3/uL (ref 0.9–3.3)
LYMPH%: 31 % (ref 14.0–48.0)
MCH: 30.2 pg (ref 26.0–34.0)
MCHC: 32.8 g/dL (ref 32.0–36.0)
MCV: 92 fL (ref 81–101)
MONO#: 0.7 10*3/uL (ref 0.1–0.9)
MONO%: 8 % (ref 0.0–13.0)
NEUT%: 60.3 % (ref 39.6–80.0)
NEUTROS ABS: 5.4 10*3/uL (ref 1.5–6.5)
Platelets: 291 10*3/uL (ref 145–400)
RBC: 4.44 10*6/uL (ref 3.70–5.32)
RDW: 13.3 % (ref 11.1–15.7)
WBC: 9 10*3/uL (ref 3.9–10.0)

## 2017-10-11 LAB — CMP (CANCER CENTER ONLY)
ALBUMIN: 3.6 g/dL (ref 3.3–5.5)
ALK PHOS: 83 U/L (ref 26–84)
ALT: 36 U/L (ref 10–47)
AST: 24 U/L (ref 11–38)
BUN, Bld: 10 mg/dL (ref 7–22)
CHLORIDE: 103 meq/L (ref 98–108)
CO2: 29 mEq/L (ref 18–33)
CREATININE: 0.9 mg/dL (ref 0.6–1.2)
Calcium: 8.7 mg/dL (ref 8.0–10.3)
Glucose, Bld: 95 mg/dL (ref 73–118)
POTASSIUM: 4.2 meq/L (ref 3.3–4.7)
Sodium: 140 mEq/L (ref 128–145)
TOTAL PROTEIN: 6.8 g/dL (ref 6.4–8.1)
Total Bilirubin: 0.6 mg/dl (ref 0.20–1.60)

## 2017-10-11 LAB — CHCC SATELLITE - SMEAR

## 2017-10-11 NOTE — Progress Notes (Signed)
Referral MD  Reason for Referral: Pulmonary embolism of the left lung;PCOS  Chief Complaint  Patient presents with  . New Patient (Initial Visit)    PE  : I am too young to have a blood clot in my lung.  HPI: Ms. Chrestman is a very nice 42 year old white female.  She is incredibly interesting to talk to.  She has accumulated a ton of frequent flyer miles on [employer].  She has 2 children.  She has a 57-year-old and a 36 year old.  She adopted the 43 year old.  Her 32-year-old was born without difficulties.  She has never had any miscarriages..  She has been on oral contraceptives since her teenage years.  She has polycystic ovaries.  She is being followed by Dr. Dian Queen.  Apparently, she was up in [facility] on a trip.  She felt some shortness of breath when she was going between terminals.  She came back.  She saw her family doctor, Dr.Thekkekakandam.  As always, he did a thorough evaluation.  She had a d-dimer that was done and it was elevated.  Ultimately she had a CT angiogram done.  This did show a thrombus in a distal left main pulmonary artery.  There was some right heart strain.  She never had any Dopplers of her leg.  She was not complain of any had a leg pain or swelling.  She does not smoke.  She does not drink.  There is no diabetes.  There is no family history of thromboembolic disease.  There is a history of malignancy in the family.  She was placed on Eliquis.  She had the CT scan done in late September.  No thrombophilic studies were done.  She was kindly referred to the [place] center for an evaluation of anticoagulation recommendations.  Again, she does have the polycystic ovaries.  I am sure that her testosterone level is high.  She still has some discomfort in the chest.  She has a little bit of left-sided chest wall discomfort.  There is no hemoptysis.  She has had no cough.  She has had no headache.  She is somewhat robust in size.  I told  her I do not think this was an issue for her having blood clots.  Overall, her performance status is ECOG 0.    Past Medical History:  Diagnosis Date  . Abnormal Pap smear and cervical HPV (human papillomavirus)   . Generalized anxiety disorder   . IBS (irritable bowel syndrome)   . PCOS (polycystic ovarian syndrome)   . Seizure disorder [facility])   :  Past Surgical History:  Procedure Laterality Date  . CHOLECYSTECTOMY    . COLPOSCOPY    . DILATION AND CURETTAGE OF UTERUS    . GYNECOLOGIC CRYOSURGERY    . KNEE SURGERY  right  :   Current Outpatient Medications:  .  apixaban (ELIQUIS) 5 MG TABS tablet, 10mg  PO BID for 1 week then 5mg  PO BID x6 months., Disp: 60 tablet, Rfl: 6 .  cetirizine (ZYRTEC) 10 MG tablet, Take 10 mg by mouth daily., Disp: , Rfl:  .  EPINEPHrine (EPI-PEN) 0.3 mg/0.3 mL SOAJ injection, Inject 0.3 mLs (0.3 mg total) into the muscle once as needed (anaphylaxis)., Disp: 1 Device, Rfl: 1 .  fluticasone (FLONASE) 50 MCG/ACT nasal spray, One spray in each nostril twice a day, use left hand for right nostril, and right hand for left nostril., Disp: 48 g, Rfl: 3 .  venlafaxine XR (EFFEXOR-XR) 75  MG 24 hr capsule, Take 1 capsule (75 mg total) by mouth daily with breakfast., Disp: 90 capsule, Rfl: 0 .  methocarbamol (ROBAXIN) 500 MG tablet, Take 1 tablet (500 mg total) by mouth 3 (three) times daily. (Patient not taking: Reported on 10/11/2017), Disp: 90 tablet, Rfl: 0:  :  Allergies  Allergen Reactions  . Shrimp [Shellfish Allergy] Hives and Swelling    Throat closes  . Codeine Hives and Itching    Pt says she can take percocet  . Cinnamon   . Morphine And Related Other (See Comments)    Big mood changes  . Robitussin A-C [Guaifenesin-Codeine] Itching  . Yeast-Related Products Hives  :  Family History  Problem Relation Age of Onset  . Hypertension Father   . Diabetes Paternal Grandmother   . Hypertension Paternal Grandmother   . Stroke Paternal Grandmother    . Diabetes Paternal Grandfather   . Hypertension Paternal Grandfather   . Stroke Paternal Grandfather   . Hypertension Mother   :  Social History   Socioeconomic History  . Marital status: Married    Spouse name: Not on file  . Number of children: Not on file  . Years of education: Not on file  . Highest education level: Not on file  Social Needs  . Financial resource strain: Not on file  . Food insecurity - worry: Not on file  . Food insecurity - inability: Not on file  . Transportation needs - medical: Not on file  . Transportation needs - non-medical: Not on file  Occupational History  . Occupation: Engineering geologist.   Tobacco Use  . Smoking status: Never Smoker  . Smokeless tobacco: Never Used  Substance and Sexual Activity  . Alcohol use: No  . Drug use: No  . Sexual activity: Not Currently  Other Topics Concern  . Not on file  Social History Narrative  . Not on file  :  Pertinent items are noted in HPI.  Exam: Obese white female in no obvious distress.  Vital signs show temperature of 98.2.  Pulse 75.  Blood pressure 140/83.  Weight is 293 pounds.  Head neck exam shows no ocular or oral lesions.  There are no palpable cervical or supra clavicular lymph nodes.  Lungs are clear bilaterally.  Cardiac exam regular rate and rhythm with no murmurs, rubs or bruits.  Abdomen is soft.  She is obese.  She has good bowel sounds.  There is no fluid wave.  There is no palpable liver or spleen tip.  Back exam shows no tenderness over the spine, ribs or hips.  Extremities shows no clubbing, cyanosis or edema.  She has no venous cord in the legs.  She has a negative Homan's sign in the legs.  She has good range of motion of her joints.  Skin exam shows no rashes, ecchymoses or petechia.  Neurological exam shows no focal neurological deficits. @IPVITALS @   Recent Labs    10/11/17 1122  WBC 9.0  HGB 13.4  HCT 40.9  PLT 291   Recent Labs    10/11/17 1122  NA 140  K 4.2  CL  103  CO2 29  GLUCOSE 95  BUN 10  CREATININE 0.9  CALCIUM 8.7    Blood smear review: None  Pathology: None    Assessment and Plan: Ms. Motl is a very charming 42 year old white female.  She has had a pulmonary embolism on the left side.  She is quite young.  She had been  on oral contraceptives.  She had been on these for quite a while.  I would not think that she has a thrombophilic state.  However, we will have to work her up for this.  Given that she has the polycystic ovary syndrome, I think this would be considered her main risk factor for blood clots.  I would think that her testosterone level is on the higher side.  I would clearly keep her on full dose Eliquis for 1 year.  I would then put her on maintenance dose for 1 year.  We will have to see what her hypercoagulable studies show.  It seems like nowadays, we find a lot of patients have a positive lupus anticoagulant.  Of note, she has been getting her mammograms.  I do not think there is any underlying malignancy.  I spent a good 45 minutes with her.  We will set her up with a CT angiogram to assess for response to Eliquis.  I also believe that she needs a Doppler of her legs.  I answered all of her questions.  I would like to see her back in about 5 or 6 weeks.

## 2017-10-12 LAB — PROTEIN C ACTIVITY: Protein C-Functional: 129 % (ref 73–180)

## 2017-10-12 LAB — PROTEIN S, TOTAL: PROTEIN S AG TOTAL: 86 % (ref 60–150)

## 2017-10-12 LAB — ANTITHROMBIN III: ANTITHROMBIN ACTIVITY: 134 % (ref 75–135)

## 2017-10-12 LAB — D-DIMER, QUANTITATIVE (NOT AT ARMC): D-DIMER: 0.34 mg{FEU}/L (ref 0.00–0.49)

## 2017-10-12 LAB — LUPUS ANTICOAGULANT PANEL
PTT-LA: 33.8 s (ref 0.0–51.9)
dRVVT: 45.1 s (ref 0.0–47.0)

## 2017-10-12 LAB — PROTEIN S ACTIVITY: PROTEIN S ACTIVITY: 73 % (ref 63–140)

## 2017-10-13 LAB — BETA-2-GLYCOPROTEIN I ABS, IGG/M/A
Beta-2 Glyco 1 IgA: <9 GPI IgA units (ref 0–25)
Beta-2 Glyco 1 IgM: <9 GPI IgM units (ref 0–32)
Beta-2 Glycoprotein I Ab, IgG: <9 GPI IgG units (ref 0–20)

## 2017-10-13 LAB — PROTEIN C, TOTAL: Protein C Antigen: 109 % (ref 60–150)

## 2017-10-13 LAB — CARDIOLIPIN ANTIBODIES, IGG, IGM, IGA: ANTICARDIOLIPIN IGM: 9 [MPL'U]/mL (ref 0–12)

## 2017-10-14 ENCOUNTER — Ambulatory Visit: Payer: BLUE CROSS/BLUE SHIELD | Admitting: Sports Medicine

## 2017-10-14 ENCOUNTER — Encounter: Payer: Self-pay | Admitting: Sports Medicine

## 2017-10-14 DIAGNOSIS — M47816 Spondylosis without myelopathy or radiculopathy, lumbar region: Secondary | ICD-10-CM

## 2017-10-14 MED ORDER — TRAMADOL HCL 50 MG PO TABS: ORAL_TABLET | ORAL | 0 refills | Status: DC

## 2017-10-14 MED ORDER — ACETAMINOPHEN ER 650 MG PO TBCR: 650.0000 mg | EXTENDED_RELEASE_TABLET | Freq: Three times a day (TID) | ORAL | 3 refills | Status: DC | PRN

## 2017-10-14 NOTE — Assessment & Plan Note (Signed)
No response to multilevel facet joint injections, not even temporary. Bilateral SI joint injections provided good relief, right side is pain-free, left side still has a recurrence of pain. I do think this indicates that her SI joints are the pain generators. Unable to do NSAIDs due to current anticoagulant. Tylenol daily, tramadol for breakthrough pain, SI joint rehab exercises. I think when she has her sleeve gastrectomy and loses 100 pounds her pain will probably go away.

## 2017-10-14 NOTE — Progress Notes (Signed)
Subjective:    CC: Low back pain  HPI: Barbara Mcmahon is a pleasant 42 year old female, she has lumbar spondylosis, axial back pain, we obtained an MRI that showed some facet arthritis, we targeted these facets with injections under fluoroscopy, she did not have any relief, not even temporary.  Afterwards at the last visit we did bilateral sacroiliac joint injections, she notices that these provided complete relief of her pain on the right and the majority of her pain on the left.  Pain is moderate, persistent.  I reviewed the past medical history, family history, social history, surgical history, and allergies today and no changes were needed.  Please see the problem list section below in epic for further details.  Past Medical History: Past Medical History:  Diagnosis Date  . Abnormal Pap smear and cervical HPV (human papillomavirus)   . Generalized anxiety disorder   . IBS (irritable bowel syndrome)   . PCOS (polycystic ovarian syndrome)   . Seizure disorder [facility])    Past Surgical History: Past Surgical History:  Procedure Laterality Date  . CHOLECYSTECTOMY    . COLPOSCOPY    . DILATION AND CURETTAGE OF UTERUS    . GYNECOLOGIC CRYOSURGERY    . KNEE SURGERY  right   Social History: Social History   Socioeconomic History  . Marital status: Married    Spouse name: None  . Number of children: None  . Years of education: None  . Highest education level: None  Social Needs  . Financial resource strain: None  . Food insecurity - worry: None  . Food insecurity - inability: None  . Transportation needs - medical: None  . Transportation needs - non-medical: None  Occupational History  . Occupation: Engineering geologist.   Tobacco Use  . Smoking status: Never Smoker  . Smokeless tobacco: Never Used  Substance and Sexual Activity  . Alcohol use: No  . Drug use: No  . Sexual activity: Not Currently  Other Topics Concern  . None  Social History Narrative  . None   Family  History: Family History  Problem Relation Age of Onset  . Hypertension Father   . Diabetes Paternal Grandmother   . Hypertension Paternal Grandmother   . Stroke Paternal Grandmother   . Diabetes Paternal Grandfather   . Hypertension Paternal Grandfather   . Stroke Paternal Grandfather   . Hypertension Mother    Allergies: Allergies  Allergen Reactions  . Shrimp [Shellfish Allergy] Hives and Swelling    Throat closes  . Codeine Hives and Itching    Pt says she can take percocet  . Cinnamon   . Morphine And Related Other (See Comments)    Big mood changes  . Robitussin A-C [Guaifenesin-Codeine] Itching  . Yeast-Related Products Hives   Medications: See med rec.  Review of Systems: No fevers, chills, night sweats, weight loss, chest pain, or shortness of breath.   Objective:    General: Well Developed, well nourished, and in no acute distress.  Neuro: Alert and oriented x3, extra-ocular muscles intact, sensation grossly intact.  HEENT: Normocephalic, atraumatic, pupils equal round reactive to light, neck supple, no masses, no lymphadenopathy, thyroid nonpalpable.  Skin: Warm and dry, no rashes. Cardiac: Regular rate and rhythm, no murmurs rubs or gallops, no lower extremity edema.  Respiratory: Clear to auscultation bilaterally. Not using accessory muscles, speaking in full sentences.   Impression and Recommendations:    Lumbar spondylosis No response to multilevel facet joint injections, not even temporary. Bilateral SI joint injections provided good  relief, right side is pain-free, left side still has a recurrence of pain. I do think this indicates that her SI joints are the pain generators. Unable to do NSAIDs due to current anticoagulant. Tylenol daily, tramadol for breakthrough pain, SI joint rehab exercises. I think when she has her sleeve gastrectomy and loses 100 pounds her pain will probably go away. ___________________________________________ Gwen Her.  Dianah Field, M.D., ABFM., CAQSM. Primary Care and [place] Instructor of [place] [facility]

## 2017-10-15 ENCOUNTER — Other Ambulatory Visit: Payer: Self-pay | Admitting: Sports Medicine

## 2017-10-15 ENCOUNTER — Ambulatory Visit: Payer: Self-pay | Admitting: Family Medicine

## 2017-10-15 ENCOUNTER — Encounter: Payer: Self-pay | Admitting: Sports Medicine

## 2017-10-15 LAB — FACTOR 5 LEIDEN

## 2017-10-16 LAB — PROTHROMBIN GENE MUTATION

## 2017-10-17 ENCOUNTER — Encounter: Payer: Self-pay | Admitting: *Deleted

## 2017-10-18 ENCOUNTER — Ambulatory Visit (HOSPITAL_BASED_OUTPATIENT_CLINIC_OR_DEPARTMENT_OTHER)
Admission: RE | Admit: 2017-10-18 | Discharge: 2017-10-18 | Disposition: A | Discharge status: Home / Self Care | Payer: BLUE CROSS/BLUE SHIELD | Source: Ambulatory Visit | Attending: Hematology & Oncology | Admitting: Hematology & Oncology

## 2017-10-18 DIAGNOSIS — I2602 Saddle embolus of pulmonary artery with acute cor pulmonale: Secondary | ICD-10-CM | POA: Insufficient documentation

## 2017-10-18 DIAGNOSIS — Z86711 Personal history of pulmonary embolism: Secondary | ICD-10-CM | POA: Insufficient documentation

## 2017-10-18 DIAGNOSIS — J301 Allergic rhinitis due to pollen: Secondary | ICD-10-CM | POA: Diagnosis not present

## 2017-10-18 DIAGNOSIS — I82403 Acute embolism and thrombosis of unspecified deep veins of lower extremity, bilateral: Secondary | ICD-10-CM | POA: Diagnosis not present

## 2017-10-18 DIAGNOSIS — I2609 Other pulmonary embolism with acute cor pulmonale: Secondary | ICD-10-CM | POA: Diagnosis not present

## 2017-10-18 MED ORDER — IOPAMIDOL (ISOVUE-370) INJECTION 76%
100.0000 mL | Freq: Once | INTRAVENOUS | Status: AC | PRN
  Administered 2017-10-18: 100 mL via INTRAVENOUS

## 2017-10-19 ENCOUNTER — Other Ambulatory Visit: Payer: Self-pay | Admitting: Family Medicine

## 2017-10-19 DIAGNOSIS — F419 Anxiety disorder, unspecified: Secondary | ICD-10-CM

## 2017-10-21 ENCOUNTER — Encounter: Payer: Self-pay | Admitting: *Deleted

## 2017-10-25 ENCOUNTER — Ambulatory Visit: Payer: BLUE CROSS/BLUE SHIELD | Admitting: Family Medicine

## 2017-10-25 ENCOUNTER — Encounter: Payer: Self-pay | Admitting: Family Medicine

## 2017-10-25 VITALS — BP 132/84 | HR 90 | Ht 65.0 in | Wt 293.0 lb

## 2017-10-25 DIAGNOSIS — I2699 Other pulmonary embolism without acute cor pulmonale: Secondary | ICD-10-CM | POA: Diagnosis not present

## 2017-10-25 DIAGNOSIS — Z6841 Body Mass Index (BMI) 40.0 and over, adult: Secondary | ICD-10-CM | POA: Diagnosis not present

## 2017-10-25 DIAGNOSIS — F419 Anxiety disorder, unspecified: Secondary | ICD-10-CM

## 2017-10-25 NOTE — Progress Notes (Signed)
Subjective:    Patient ID: Barbara Mcmahon, female    DOB: 1976/04/18, 42 y.o.   MRN: [id number]  HPI 3 mo f/u for Pulmonary embolism - she is doing well overall. She will be on low-dose Eliquis for 1 year and then maintenance dosing for 1 year.  BMI 48 -she is actually consulting with the bariatric surgeon.  She has gone to the initial seminar and is planning on scheduling an office visit.  She knows she will have to go through an appeal process through work and is Artie checked with them on this.  Anxiety-she is doing well on her Effexor and is very happy with her regimen.  She really takes it because she just gets a little worked up at work and she does have a more high stress job and is just can help keep things a little bit more level.  Review of Systems  BP 132/84   Pulse 90   Ht 5\' 5"  (1.651 m)   Wt 293 lb (132.9 kg)   SpO2 97%   BMI 48.76 kg/m     Allergies  Allergen Reactions  . Shrimp [Shellfish Allergy] Hives and Swelling    Throat closes  . Codeine Hives and Itching    Pt says she can take percocet  . Cinnamon   . Tramadol Itching  . Morphine And Related Other (See Comments)    Big mood changes  . Robitussin A-C [Guaifenesin-Codeine] Itching  . Yeast-Related Products Hives    Past Medical History:  Diagnosis Date  . Abnormal Pap smear and cervical HPV (human papillomavirus)   . Generalized anxiety disorder   . IBS (irritable bowel syndrome)   . PCOS (polycystic ovarian syndrome)   . Seizure disorder [facility])     Past Surgical History:  Procedure Laterality Date  . CHOLECYSTECTOMY    . COLPOSCOPY    . DILATION AND CURETTAGE OF UTERUS    . GYNECOLOGIC CRYOSURGERY    . KNEE SURGERY  right    Social History   Socioeconomic History  . Marital status: Married    Spouse name: Not on file  . Number of children: Not on file  . Years of education: Not on file  . Highest education level: Not on file  Social Needs  . Financial resource strain: Not on file   . Food insecurity - worry: Not on file  . Food insecurity - inability: Not on file  . Transportation needs - medical: Not on file  . Transportation needs - non-medical: Not on file  Occupational History  . Occupation: Engineering geologist.   Tobacco Use  . Smoking status: Never Smoker  . Smokeless tobacco: Never Used  Substance and Sexual Activity  . Alcohol use: No  . Drug use: No  . Sexual activity: Not Currently  Other Topics Concern  . Not on file  Social History Narrative  . Not on file    Family History  Problem Relation Age of Onset  . Hypertension Father   . Diabetes Paternal Grandmother   . Hypertension Paternal Grandmother   . Stroke Paternal Grandmother   . Diabetes Paternal Grandfather   . Hypertension Paternal Grandfather   . Stroke Paternal Grandfather   . Hypertension Mother     Outpatient Encounter Medications as of 10/25/2017  Medication Sig  . acetaminophen (TYLENOL) 650 MG CR tablet Take 1 tablet (650 mg total) by mouth every 8 (eight) hours as needed for pain.  Marland Kitchen apixaban (ELIQUIS) 5 MG TABS tablet 10mg   PO BID for 1 week then 5mg  PO BID x6 months.  . cetirizine (ZYRTEC) 10 MG tablet Take 10 mg by mouth daily.  Marland Kitchen EPINEPHrine (EPI-PEN) 0.3 mg/0.3 mL SOAJ injection Inject 0.3 mLs (0.3 mg total) into the muscle once as needed (anaphylaxis).  . fluticasone (FLONASE) 50 MCG/ACT nasal spray One spray in each nostril twice a day, use left hand for right nostril, and right hand for left nostril.  . methocarbamol (ROBAXIN) 500 MG tablet Take 1 tablet (500 mg total) by mouth 3 (three) times daily.  Marland Kitchen venlafaxine XR (EFFEXOR-XR) 75 MG 24 hr capsule TAKE 1 CAPSULE (75 MG TOTAL) BY MOUTH DAILY WITH BREAKFAST.   No facility-administered encounter medications on file as of 10/25/2017.         Objective:   Physical Exam  Constitutional: She is oriented to person, place, and time. She appears well-developed and well-nourished.  HENT:  Head: Normocephalic and  atraumatic.  Cardiovascular: Normal rate, regular rhythm and normal heart sounds.  Pulmonary/Chest: Effort normal and breath sounds normal.  Neurological: She is alert and oriented to person, place, and time.  Skin: Skin is warm and dry.  Psychiatric: She has a normal mood and affect. Her behavior is normal.     Assessment & Plan:  Pulm embolism -she is doing really well overall and has a good care plan in place.  She will actually follow back up with Dr. Marin Olp in about 3 months of Aleve.  BMI 48 -she is scheduling appointment the bariatric surgeon.  Will be happy to help her at any point if we need to follow her for care for weight loss at any point.  Anxiety-continue Effexor.  She actually would like to drop down her dose in the spring.  Encouraged her just to give [name] a call and if she is doing well at that point we will try dropping her down to 37.5 mg and see if this works well for her.  I think it is reasonable to try as she is been on this regimen for several years at this point.

## 2017-11-01 DIAGNOSIS — J301 Allergic rhinitis due to pollen: Secondary | ICD-10-CM | POA: Diagnosis not present

## 2017-11-06 DIAGNOSIS — J301 Allergic rhinitis due to pollen: Secondary | ICD-10-CM | POA: Diagnosis not present

## 2017-11-10 ENCOUNTER — Encounter: Payer: Self-pay | Admitting: Sports Medicine

## 2017-11-11 ENCOUNTER — Telehealth: Payer: Self-pay

## 2017-11-11 MED ORDER — OSELTAMIVIR PHOSPHATE 75 MG PO CAPS: 75.0000 mg | ORAL_CAPSULE | Freq: Every day | ORAL | 0 refills | Status: DC

## 2017-11-11 NOTE — Telephone Encounter (Signed)
Patient aware.

## 2017-11-11 NOTE — Telephone Encounter (Signed)
Barbara Mcmahon called and states both her kids tested positive for Flu A. She would like Tamiflu sent to [facility].

## 2017-11-11 NOTE — Telephone Encounter (Signed)
Sent!

## 2017-11-15 ENCOUNTER — Encounter: Payer: Self-pay | Admitting: Hematology & Oncology

## 2017-11-15 ENCOUNTER — Other Ambulatory Visit: Payer: Self-pay

## 2017-11-15 ENCOUNTER — Inpatient Hospital Stay: Payer: BLUE CROSS/BLUE SHIELD | Attending: Hematology & Oncology | Admitting: Hematology & Oncology

## 2017-11-15 ENCOUNTER — Inpatient Hospital Stay: Payer: BLUE CROSS/BLUE SHIELD

## 2017-11-15 VITALS — BP 138/90 | HR 80 | Temp 98.2°F | Resp 18 | Wt 292.0 lb

## 2017-11-15 DIAGNOSIS — J301 Allergic rhinitis due to pollen: Secondary | ICD-10-CM | POA: Diagnosis not present

## 2017-11-15 DIAGNOSIS — I2782 Chronic pulmonary embolism: Secondary | ICD-10-CM

## 2017-11-15 DIAGNOSIS — I2602 Saddle embolus of pulmonary artery with acute cor pulmonale: Secondary | ICD-10-CM

## 2017-11-15 DIAGNOSIS — Z7901 Long term (current) use of anticoagulants: Secondary | ICD-10-CM | POA: Insufficient documentation

## 2017-11-15 DIAGNOSIS — E282 Polycystic ovarian syndrome: Secondary | ICD-10-CM | POA: Insufficient documentation

## 2017-11-15 DIAGNOSIS — I2699 Other pulmonary embolism without acute cor pulmonale: Secondary | ICD-10-CM | POA: Diagnosis not present

## 2017-11-15 LAB — CMP (CANCER CENTER ONLY)
ALBUMIN: 3.7 g/dL (ref 3.5–5.0)
ALK PHOS: 89 U/L (ref 40–150)
ALT: 31 U/L (ref 0–55)
ANION GAP: 9 (ref 3–11)
AST: 25 U/L (ref 5–34)
BILIRUBIN TOTAL: 0.4 mg/dL (ref 0.2–1.2)
BUN: 12 mg/dL (ref 7–26)
CALCIUM: 9.2 mg/dL (ref 8.4–10.4)
CO2: 27 mmol/L (ref 22–29)
Chloride: 106 mmol/L (ref 98–109)
Creatinine: 0.74 mg/dL (ref 0.60–1.10)
GFR, Est AFR Am: >60 mL/min (ref >60)
GLUCOSE: 95 mg/dL (ref 70–140)
Potassium: 4.2 mmol/L (ref 3.5–5.1)
Sodium: 142 mmol/L (ref 136–145)
TOTAL PROTEIN: 7 g/dL (ref 6.4–8.3)

## 2017-11-15 LAB — D-DIMER, QUANTITATIVE (NOT AT ARMC): D DIMER QUANT: 0.38 ug{FEU}/mL (ref 0.00–0.50)

## 2017-11-15 LAB — CBC WITH DIFFERENTIAL (CANCER CENTER ONLY)
BASOS PCT: 1 %
Basophils Absolute: 0 10*3/uL (ref 0.0–0.1)
Eosinophils Absolute: 0.1 10*3/uL (ref 0.0–0.5)
Eosinophils Relative: 1 %
HEMATOCRIT: 41.8 % (ref 34.8–46.6)
HEMOGLOBIN: 13.6 g/dL (ref 11.6–15.9)
LYMPHS ABS: 2.6 10*3/uL (ref 0.9–3.3)
LYMPHS PCT: 36 %
MCH: 29.8 pg (ref 26.0–34.0)
MCHC: 32.5 g/dL (ref 32.0–36.0)
MCV: 91.5 fL (ref 81.0–101.0)
MONO ABS: 0.6 10*3/uL (ref 0.1–0.9)
Monocytes Relative: 8 %
NEUTROS ABS: 4 10*3/uL (ref 1.5–6.5)
NEUTROS PCT: 54 %
Platelet Count: 278 10*3/uL (ref 145–400)
RBC: 4.57 MIL/uL (ref 3.70–5.32)
RDW: 12.9 % (ref 11.1–15.7)
WBC Count: 7.4 10*3/uL (ref 3.9–10.0)

## 2017-11-15 NOTE — Progress Notes (Signed)
Hematology and Oncology Follow Up Visit  Barbara Mcmahon [id number] 1976-01-16 42 y.o. 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 twice daily-complete 1 year of therapy in September 2019     Interim History:  [name] is back for follow-up.  We first saw her back in early January.  At that time, she had a pulmonary embolism.  This happened when she was on a trip.  This was back in September.  She had a CT angiogram.  This showed a thrombus in her left main pulmonary artery.  We went ahead and repeated her CT angiogram in January.  There is no pulmonary embolism that was noted.  We also did a Doppler of her legs.  The Doppler did not show any evidence of thromboembolic disease.  We did do a comprehensive thrombophilic panel.  Thankfully, the thrombophilic panel did not show any hypercoagulable condition.  I have to believe that her polycystic ovaries are the main reason for her to have the thromboembolic event.  She is feeling well.  She is still traveling quite a bit.  She has had no issues with nausea or vomiting.  She has had no bleeding.  She does not have any monthly cycle because of a IUD.  Her children recently have had influenza A.  Thankfully, she has been free of this.  She has not had any issues with leg swelling.  She does wear compression stockings when she travels.  She has had no problems with bowels or bladder.  Overall, her performance status is ECOG 0.  Medications:  Current Outpatient Medications:  .  acetaminophen (TYLENOL) 650 MG CR tablet, Take 1 tablet (650 mg total) by mouth every 8 (eight) hours as needed for pain., Disp: 90 tablet, Rfl: 3 .  apixaban (ELIQUIS) 5 MG TABS tablet, 10mg  PO BID for 1 week then 5mg  PO BID x6 months., Disp: 60 tablet, Rfl: 6 .  cetirizine (ZYRTEC) 10 MG tablet, Take 10 mg by mouth daily., Disp: , Rfl:  .   EPINEPHrine (EPI-PEN) 0.3 mg/0.3 mL SOAJ injection, Inject 0.3 mLs (0.3 mg total) into the muscle once as needed (anaphylaxis)., Disp: 1 Device, Rfl: 1 .  fluticasone (FLONASE) 50 MCG/ACT nasal spray, One spray in each nostril twice a day, use left hand for right nostril, and right hand for left nostril., Disp: 48 g, Rfl: 3 .  methocarbamol (ROBAXIN) 500 MG tablet, Take 1 tablet (500 mg total) by mouth 3 (three) times daily., Disp: 90 tablet, Rfl: 0 .  oseltamivir (TAMIFLU) 75 MG capsule, Take 1 capsule (75 mg total) by mouth daily., Disp: 10 capsule, Rfl: 0 .  venlafaxine XR (EFFEXOR-XR) 75 MG 24 hr capsule, TAKE 1 CAPSULE (75 MG TOTAL) BY MOUTH DAILY WITH BREAKFAST., Disp: 90 capsule, Rfl: 0  Allergies:  Allergies  Allergen Reactions  . Morphine And Related Other (See Comments), Hives and Rash    Big mood changes BEHAVIOR CHANGES   . Shellfish Allergy Hives, Swelling and Anaphylaxis    Throat closes  . Codeine Hives and Itching    Pt says she can take percocet  . Cinnamon   . Tramadol Itching  . Robitussin A-C [Guaifenesin-Codeine] Itching  . Yeast-Related Products Hives    Past Medical History, Surgical history, Social history, and Family History were reviewed and updated.  Review of Systems: Review of Systems  Constitutional: Negative.   HENT:  Negative.   Eyes: Negative.   Respiratory: Negative.   Cardiovascular: Negative.   Gastrointestinal: Negative.   Endocrine: Negative.   Genitourinary: Negative.    Musculoskeletal: Negative.   Skin: Negative.   Neurological: Negative.   Hematological: Negative.   Psychiatric/Behavioral: Negative.     Physical Exam:  weight is 292 lb (132.5 kg). Her oral temperature is 98.2 F (36.8 C). Her blood pressure is 138/90 and her pulse is 80. Her respiration is 18 and oxygen saturation is 99%.   Wt Readings from Last 3 Encounters:  11/15/17 292 lb (132.5 kg)  10/25/17 293 lb (132.9 kg)  10/14/17 293 lb (132.9 kg)    Physical  Exam  Constitutional: She is oriented to person, place, and time.  HENT:  Head: Normocephalic and atraumatic.  Mouth/Throat: Oropharynx is clear and moist.  Eyes: EOM are normal. Pupils are equal, round, and reactive to light.  Neck: Normal range of motion.  Cardiovascular: Normal rate, regular rhythm and normal heart sounds.  Pulmonary/Chest: Effort normal and breath sounds normal.  Abdominal: Soft. Bowel sounds are normal.  Musculoskeletal: Normal range of motion. She exhibits no edema, tenderness or deformity.  Lymphadenopathy:    She has no cervical adenopathy.  Neurological: She is alert and oriented to person, place, and time.  Skin: Skin is warm and dry. No rash noted. No erythema.  Psychiatric: She has a normal mood and affect. Her behavior is normal. Judgment and thought content normal.  Vitals reviewed.    Lab Results  Component Value Date   WBC 7.4 11/15/2017   HGB 13.4 10/11/2017   HCT 41.8 11/15/2017   MCV 91.5 11/15/2017   PLT 278 11/15/2017     Chemistry      Component Value Date/Time   NA 140 10/11/2017 1122   K 4.2 10/11/2017 1122   CL 103 10/11/2017 1122   CO2 29 10/11/2017 1122   BUN 10 10/11/2017 1122   CREATININE 0.9 10/11/2017 1122      Component Value Date/Time   CALCIUM 8.7 10/11/2017 1122   ALKPHOS 83 10/11/2017 1122   AST 24 10/11/2017 1122   ALT 36 10/11/2017 1122   BILITOT 0.60 10/11/2017 1122       Impression and Plan: Barbara Mcmahon is a 42 year old white female.  She has polycystic ovaries.  She had a pulmonary embolism in September 2018.  She is doing well on Eliquis.  Her recent studies did not show any residual thromboembolism.  We will continue her on full dose Eliquis for a year.  I would then put her on maintenance Eliquis.  I just am not sure if she requires long-term maintenance Eliquis.  Again, I have to believe that her risk factor for thromboembolic disease is the polycystic ovaries.  I will plan to see her back in 6  months.  I spent about 30 minutes with her.  I spent over 50% of the time face-to-face.  I reviewed her CT angiogram and Dopplers.  I talked to her about Eliquis.  I reviewed her labs.   Volanda Napoleon, MD 2/8/201911:07 AM

## 2017-11-16 LAB — TESTOSTERONE: TESTOSTERONE: 7 ng/dL — AB (ref 8–48)

## 2017-11-22 DIAGNOSIS — J301 Allergic rhinitis due to pollen: Secondary | ICD-10-CM | POA: Diagnosis not present

## 2017-11-27 ENCOUNTER — Encounter: Payer: Self-pay | Admitting: Osteopathic Medicine

## 2017-11-27 ENCOUNTER — Ambulatory Visit: Payer: BLUE CROSS/BLUE SHIELD | Admitting: Osteopathic Medicine

## 2017-11-27 VITALS — BP 140/89 | HR 99 | Temp 97.9°F | Wt 293.0 lb

## 2017-11-27 DIAGNOSIS — B9789 Other viral agents as the cause of diseases classified elsewhere: Secondary | ICD-10-CM | POA: Diagnosis not present

## 2017-11-27 DIAGNOSIS — J069 Acute upper respiratory infection, unspecified: Secondary | ICD-10-CM | POA: Diagnosis not present

## 2017-11-27 MED ORDER — LIDOCAINE VISCOUS HCL 2 % MT SOLN: 5.0000 mL | OROMUCOSAL | 1 refills | Status: DC | PRN

## 2017-11-27 MED ORDER — BENZONATATE 200 MG PO CAPS: 200.0000 mg | ORAL_CAPSULE | Freq: Three times a day (TID) | ORAL | 0 refills | Status: DC | PRN

## 2017-11-27 MED ORDER — IPRATROPIUM BROMIDE 0.06 % NA SOLN: 2.0000 | Freq: Four times a day (QID) | NASAL | 1 refills | Status: DC

## 2017-11-27 MED ORDER — METHYLPREDNISOLONE 4 MG PO TBPK: ORAL_TABLET | ORAL | 0 refills | Status: DC

## 2017-11-27 NOTE — Progress Notes (Signed)
HPI: Barbara Mcmahon is a 42 y.o. female who  has a past medical history of Abnormal Pap smear and cervical HPV (human papillomavirus), Generalized anxiety disorder, IBS (irritable bowel syndrome), PCOS (polycystic ovarian syndrome), and Seizure disorder ([place]).  she presents to [facility] today, 11/27/17,  for chief complaint of: FEELING SICK   . Location/Quality: productive cough, nasal drainage, chest congestion, ear pain . Duration: 4-5 days  . Modifying factors: OTC sudafed minimally helpful  . Assoc signs/symptoms: low grade temp to 63 F    Past medical, surgical, social and family history reviewed:  Patient Active Problem List   Diagnosis Date Noted  . Pulmonary embolism ([place]) 07/04/2017  . Lumbar spondylosis 04/16/2017  . GAD (generalized anxiety disorder) 04/08/2017  . Shrimp allergy 09/24/2016  . 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 MRSA (methicillin resistant Staphylococcus aureus) 01/14/2015  . Family history of hypertrophic cardiomyopathy 04/23/2013  . Raynaud disease 11/19/2012  . Irritable bowel syndrome with diarrhea (colonoscopy 2007) 07/04/2012  . Obesity, morbid (more than 100 lbs over ideal weight or BMI > 40) ([place]) 06/30/2012  . Anxiety 06/30/2012  . PCOS (polycystic ovarian syndrome) 06/30/2012    Past Surgical History:  Procedure Laterality Date  . CHOLECYSTECTOMY    . COLPOSCOPY    . DILATION AND CURETTAGE OF UTERUS    . GYNECOLOGIC CRYOSURGERY    . KNEE SURGERY  right    Social History   Tobacco Use  . Smoking status: Never Smoker  . Smokeless tobacco: Never Used  Substance Use Topics  . Alcohol use: No    Family History  Problem Relation Age of Onset  . Hypertension Father   . Diabetes Paternal Grandmother   . Hypertension Paternal Grandmother   . Stroke Paternal Grandmother   . Diabetes Paternal Grandfather   . Hypertension Paternal Grandfather   . Stroke Paternal Grandfather   . Hypertension Mother       Current medication list and allergy/intolerance information reviewed:    Current Outpatient Medications  Medication Sig Dispense Refill  . acetaminophen (TYLENOL) 650 MG CR tablet Take 1 tablet (650 mg total) by mouth every 8 (eight) hours as needed for pain. 90 tablet 3  . apixaban (ELIQUIS) 5 MG TABS tablet 10mg  PO BID for 1 week then 5mg  PO BID x6 months. 60 tablet 6  . cetirizine (ZYRTEC) 10 MG tablet Take 10 mg by mouth daily.    Marland Kitchen EPINEPHrine (EPI-PEN) 0.3 mg/0.3 mL SOAJ injection Inject 0.3 mLs (0.3 mg total) into the muscle once as needed (anaphylaxis). 1 Device 1  . fluticasone (FLONASE) 50 MCG/ACT nasal spray One spray in each nostril twice a day, use left hand for right nostril, and right hand for left nostril. 48 g 3  . methocarbamol (ROBAXIN) 500 MG tablet Take 1 tablet (500 mg total) by mouth 3 (three) times daily. 90 tablet 0  . oseltamivir (TAMIFLU) 75 MG capsule Take 1 capsule (75 mg total) by mouth daily. 10 capsule 0  . venlafaxine XR (EFFEXOR-XR) 75 MG 24 hr capsule TAKE 1 CAPSULE (75 MG TOTAL) BY MOUTH DAILY WITH BREAKFAST. 90 capsule 0   No current facility-administered medications for this visit.     Allergies  Allergen Reactions  . Morphine And Related Other (See Comments), Hives and Rash    Big mood changes BEHAVIOR CHANGES   . Shellfish Allergy Hives, Swelling and Anaphylaxis    Throat closes  . Codeine Hives and Itching    Pt says she  can take percocet  . Cinnamon   . Tramadol Itching  . Robitussin A-C [Guaifenesin-Codeine] Itching  . Yeast-Related Products Hives      Review of Systems:  Constitutional:  No  fever, no chills, +recent illness, No unintentional weight changes. +fatigue.   HEENT: No  headache, no vision change, no hearing change, +sore throat, +sinus pressure  Cardiac: No  chest pain, No  pressure, No palpitations  Respiratory:  No  shortness of breath. +Cough  Gastrointestinal: No  abdominal pain, No  nausea, No  vomiting,   No  blood in stool, No  diarrhea  Musculoskeletal: No new myalgia/arthralgia  Skin: No  Rash  Neurologic: No  weakness, No  dizziness  Exam:  BP 140/89   Pulse 99   Temp 97.9 F (36.6 C) (Oral)   Wt 293 lb 0.6 oz (132.9 kg)   BMI 48.76 kg/m   Constitutional: VS see above. General Appearance: alert, well-developed, well-nourished, NAD  Eyes: Normal lids and conjunctive, non-icteric sclera  Ears, Nose, Mouth, Throat: MMM, Normal external inspection ears/nares/mouth/lips/gums. TM normal bilaterally. Pharynx/tonsils +erythema, no exudate. Nasal mucosa normal.   Neck: No masses, trachea midline. No tenderness/mass appreciated. No lymphadenopathy  Respiratory: Normal respiratory effort. no wheeze, no rhonchi, no rales  Cardiovascular: S1/S2 normal, no murmur, no rub/gallop auscultated. RRR. No lower extremity edema.   Gastrointestinal: Nontender, no masses. Bowel sounds normal.  Musculoskeletal: Gait normal.   Neurological: Normal balance/coordination. No tremor.   Skin: warm, dry, intact.   Psychiatric: Normal judgment/insight. Normal mood and affect.     ASSESSMENT/PLAN:   Viral URI with cough   Meds ordered this encounter  Medications  . benzonatate (TESSALON) 200 MG capsule    Sig: Take 1 capsule (200 mg total) by mouth 3 (three) times daily as needed for cough.    Dispense:  30 capsule    Refill:  0  . ipratropium (ATROVENT) 0.06 % nasal spray    Sig: Place 2 sprays into both nostrils 4 (four) times daily.    Dispense:  15 mL    Refill:  1  . Lidocaine HCl 2 % SOLN    Sig: Use as directed 5-10 mLs in the mouth or throat every 3 (three) hours as needed (mouth/throat pain).    Dispense:  100 mL    Refill:  1  . methylPREDNISolone (MEDROL DOSEPAK) 4 MG TBPK tablet    Sig: 6-day pack as directed    Dispense:  21 tablet    Refill:  0       Patient Instructions  Over-the-Counter Medications & Home Remedies for Upper Respiratory Illness  Note: the  following list assumes no pregnancy, normal liver & kidney function and no other drug interactions. Dr. Sheppard Coil has highlighted medications which are safe for you to use, but these may not be appropriate for everyone. Always ask a pharmacist or qualified medical provider if you have any questions!   Aches/Pains, Fever, Headache Acetaminophen (Tylenol) 500 mg tablets - take max 2 tablets (1000 mg) every 6 hours (4 times per day)   Sinus Congestion Prescription Atrovent as directed Nasal Saline if desired Phenylephrine (Sudafed) 10 mg tablets every 4 hours (or the 12-hour formulation)* Diphenhydramine (Benadryl) 25 mg tablets - take max 2 tablets every 4 hours  Cough & Sore Throat Prescription cough pills or syrups as directed Dextromethorphan (Robitussin, others) - cough suppressant Guaifenesin (Robitussin, Mucinex, others) - expectorant (helps cough up mucus) (Dextromethorphan and Guaifenesin also come in a combination tablet) Lozenges w/  Benzocaine + Menthol (Cepacol) Honey - as much as you want! Teas which "coat the throat" - look for ingredients Elm Bark, Licorice Root, Marshmallow Root  Other Steroids to reduce inflammation, caution due to small risk of blood clots  Antibiotics if these are prescribed - take ALL, even if you're feeling better  Zinc Lozenges within 24 hours of symptoms onset - mixed evidence this shortens the duration of the common cold Don't waste your money on Vitamin C or Echinacea  *Caution in patients with high blood pressure       Visit summary with medication list and pertinent instructions was printed for patient to review. All questions at time of visit were answered - patient instructed to contact office with any additional concerns. ER/RTC precautions were reviewed with the patient.   Follow-up plan: Return if symptoms worsen or fail to improve.   Please note: voice recognition software was used to produce this document, and typos may escape  review. Please contact Dr. Sheppard Coil for any needed clarifications.

## 2017-11-27 NOTE — Patient Instructions (Signed)
Over-the-Counter Medications & Home Remedies for Upper Respiratory Illness  Note: the following list assumes no pregnancy, normal liver & kidney function and no other drug interactions. Dr. Sheppard Coil has highlighted medications which are safe for you to use, but these may not be appropriate for everyone. Always ask a pharmacist or qualified medical provider if you have any questions!   Aches/Pains, Fever, Headache Acetaminophen (Tylenol) 500 mg tablets - take max 2 tablets (1000 mg) every 6 hours (4 times per day)   Sinus Congestion Prescription Atrovent as directed Nasal Saline if desired Phenylephrine (Sudafed) 10 mg tablets every 4 hours (or the 12-hour formulation)* Diphenhydramine (Benadryl) 25 mg tablets - take max 2 tablets every 4 hours  Cough & Sore Throat Prescription cough pills or syrups as directed Dextromethorphan (Robitussin, others) - cough suppressant Guaifenesin (Robitussin, Mucinex, others) - expectorant (helps cough up mucus) (Dextromethorphan and Guaifenesin also come in a combination tablet) Lozenges w/ Benzocaine + Menthol (Cepacol) Honey - as much as you want! Teas which "coat the throat" - look for ingredients Elm Bark, Licorice Root, Marshmallow Root  Other Steroids to reduce inflammation, caution due to small risk of blood clots  Antibiotics if these are prescribed - take ALL, even if you're feeling better  Zinc Lozenges within 24 hours of symptoms onset - mixed evidence this shortens the duration of the common cold Don't waste your money on Vitamin C or Echinacea  *Caution in patients with high blood pressure

## 2017-11-29 ENCOUNTER — Ambulatory Visit: Payer: BLUE CROSS/BLUE SHIELD | Admitting: Sports Medicine

## 2017-12-04 ENCOUNTER — Ambulatory Visit: Payer: BLUE CROSS/BLUE SHIELD | Admitting: Family Medicine

## 2017-12-04 ENCOUNTER — Encounter: Payer: Self-pay | Admitting: Family Medicine

## 2017-12-04 VITALS — BP 122/76 | HR 89 | Temp 98.0°F | Ht 65.0 in | Wt 294.0 lb

## 2017-12-04 DIAGNOSIS — L539 Erythematous condition, unspecified: Secondary | ICD-10-CM | POA: Diagnosis not present

## 2017-12-04 DIAGNOSIS — M542 Cervicalgia: Secondary | ICD-10-CM | POA: Diagnosis not present

## 2017-12-04 DIAGNOSIS — J069 Acute upper respiratory infection, unspecified: Secondary | ICD-10-CM

## 2017-12-04 LAB — CBC WITH DIFFERENTIAL/PLATELET
BASOS PCT: 0.7 %
Basophils Absolute: 85 cells/uL (ref 0–200)
EOS PCT: 0.7 %
Eosinophils Absolute: 85 cells/uL (ref 15–500)
HCT: 42.3 % (ref 35.0–45.0)
HEMOGLOBIN: 14.2 g/dL (ref 11.7–15.5)
Lymphs Abs: 4795 cells/uL — ABNORMAL HIGH (ref 850–3900)
MCH: 29.2 pg (ref 27.0–33.0)
MCHC: 33.6 g/dL (ref 32.0–36.0)
MCV: 86.9 fL (ref 80.0–100.0)
MONOS PCT: 7.3 %
MPV: 9.9 fL (ref 7.5–12.5)
NEUTROS ABS: 6344 {cells}/uL (ref 1500–7800)
Neutrophils Relative %: 52 %
PLATELETS: 387 10*3/uL (ref 140–400)
RBC: 4.87 10*6/uL (ref 3.80–5.10)
RDW: 12.8 % (ref 11.0–15.0)
TOTAL LYMPHOCYTE: 39.3 %
WBC mixed population: 891 cells/uL (ref 200–950)
WBC: 12.2 10*3/uL — AB (ref 3.8–10.8)

## 2017-12-04 NOTE — Progress Notes (Signed)
Subjective:    Patient ID: Barbara Mcmahon, female    DOB: June 08, 1976, 42 y.o.   MRN: [id number]  HPI 10 days of swelling over her upper body and cough.  She feels very fatigued and achey.  NO fever, chills or sweats.  She was seen about 1 week ago and was given tessalon pearles and prednisone for viral URI and cough.  At the time she had a productive cough, chest congestion, nasal congestion and low-grade temperature.  Since then she is actually feels like the chest congestion has gotten better and her cough is become a little bit more dry but she is still coughing.  She still has some nasal congestion though it is better as well.  The throat feels a little irritated at times but no significant sore throat.  She came in mostly today because she was feeling that her lymph nodes in her neck and down into her upper arms and chest were swelling.  They just feel tender and sore.  She has not had any recent fevers.  Review of Systems  BP 122/76   Pulse 89   Temp 98 F (36.7 C)   Ht 5\' 5"  (1.651 m)   Wt 294 lb (133.4 kg)   SpO2 99%   BMI 48.92 kg/m     Allergies  Allergen Reactions  . Morphine And Related Other (See Comments), Hives and Rash    Big mood changes BEHAVIOR CHANGES   . Shellfish Allergy Hives, Swelling and Anaphylaxis    Throat closes  . Codeine Hives and Itching    Pt says she can take percocet  . Cinnamon   . Tramadol Itching  . Robitussin A-C [Guaifenesin-Codeine] Itching  . Yeast-Related Products Hives    Past Medical History:  Diagnosis Date  . Abnormal Pap smear and cervical HPV (human papillomavirus)   . Generalized anxiety disorder   . IBS (irritable bowel syndrome)   . PCOS (polycystic ovarian syndrome)   . Seizure disorder [facility])     Past Surgical History:  Procedure Laterality Date  . CHOLECYSTECTOMY    . COLPOSCOPY    . DILATION AND CURETTAGE OF UTERUS    . GYNECOLOGIC CRYOSURGERY    . KNEE SURGERY  right    Social History   Socioeconomic  History  . Marital status: Married    Spouse name: Not on file  . Number of children: Not on file  . Years of education: Not on file  . Highest education level: Not on file  Social Needs  . Financial resource strain: Not on file  . Food insecurity - worry: Not on file  . Food insecurity - inability: Not on file  . Transportation needs - medical: Not on file  . Transportation needs - non-medical: Not on file  Occupational History  . Occupation: Engineering geologist.   Tobacco Use  . Smoking status: Never Smoker  . Smokeless tobacco: Never Used  Substance and Sexual Activity  . Alcohol use: No  . Drug use: No  . Sexual activity: Not Currently  Other Topics Concern  . Not on file  Social History Narrative  . Not on file    Family History  Problem Relation Age of Onset  . Hypertension Father   . Diabetes Paternal Grandmother   . Hypertension Paternal Grandmother   . Stroke Paternal Grandmother   . Diabetes Paternal Grandfather   . Hypertension Paternal Grandfather   . Stroke Paternal Grandfather   . Hypertension Mother  Outpatient Encounter Medications as of 12/04/2017  Medication Sig  . acetaminophen (TYLENOL) 650 MG CR tablet Take 1 tablet (650 mg total) by mouth every 8 (eight) hours as needed for pain.  Marland Kitchen apixaban (ELIQUIS) 5 MG TABS tablet 10mg  PO BID for 1 week then 5mg  PO BID x6 months.  . cetirizine (ZYRTEC) 10 MG tablet Take 10 mg by mouth daily.  Marland Kitchen EPINEPHrine (EPI-PEN) 0.3 mg/0.3 mL SOAJ injection Inject 0.3 mLs (0.3 mg total) into the muscle once as needed (anaphylaxis).  . fluticasone (FLONASE) 50 MCG/ACT nasal spray One spray in each nostril twice a day, use left hand for right nostril, and right hand for left nostril.  Marland Kitchen ipratropium (ATROVENT) 0.06 % nasal spray Place 2 sprays into both nostrils 4 (four) times daily.  . methocarbamol (ROBAXIN) 500 MG tablet Take 1 tablet (500 mg total) by mouth 3 (three) times daily.  Marland Kitchen venlafaxine XR (EFFEXOR-XR) 75 MG 24 hr  capsule TAKE 1 CAPSULE (75 MG TOTAL) BY MOUTH DAILY WITH BREAKFAST.  . [DISCONTINUED] benzonatate (TESSALON) 200 MG capsule Take 1 capsule (200 mg total) by mouth 3 (three) times daily as needed for cough.  . [DISCONTINUED] Lidocaine HCl 2 % SOLN Use as directed 5-10 mLs in the mouth or throat every 3 (three) hours as needed (mouth/throat pain).  . [DISCONTINUED] methylPREDNISolone (MEDROL DOSEPAK) 4 MG TBPK tablet 6-day pack as directed  . [DISCONTINUED] oseltamivir (TAMIFLU) 75 MG capsule Take 1 capsule (75 mg total) by mouth daily. (Patient not taking: Reported on 11/27/2017)   No facility-administered encounter medications on file as of 12/04/2017.          Objective:   Physical Exam  Constitutional: She is oriented to person, place, and time. She appears well-developed and well-nourished.  HENT:  Head: Normocephalic and atraumatic.  Right Ear: External ear normal.  Left Ear: External ear normal.  Nose: Nose normal.  TMs and canals are clear.  Posterior oropharynx with significant erythema with erythematous blotches.  No vesicles.  Eyes: Conjunctivae and EOM are normal. Pupils are equal, round, and reactive to light.  Neck: Neck supple. No thyromegaly present.  Anterior neck was tender to palpation though I didn't palpate any distinctly enlarged nodes.    Cardiovascular: Normal rate, regular rhythm and normal heart sounds.  Pulmonary/Chest: Effort normal and breath sounds normal. She has no wheezes.  Lymphadenopathy:    She has no cervical adenopathy.  Neurological: She is alert and oriented to person, place, and time.  Skin: Skin is warm and dry.  Psychiatric: She has a normal mood and affect.        Assessment & Plan:  Upper respiratory illness-unclear etiology.  She does have some significant erythema on her throat.  Suspect that this is viral.  Will check a CBC since she feels like there is a lot of inflammation in her lymph nodes will call with results once available.   Consider alternative diagnoses such as CMV and EBV.  If not improving we will work this up further.  If cough and nasal symptoms persist then consider treating her sinusitis with an antibiotic.

## 2017-12-05 ENCOUNTER — Encounter: Payer: Self-pay | Admitting: Family Medicine

## 2017-12-05 ENCOUNTER — Other Ambulatory Visit: Payer: Self-pay | Admitting: Family Medicine

## 2017-12-05 DIAGNOSIS — R82998 Other abnormal findings in urine: Secondary | ICD-10-CM

## 2017-12-05 DIAGNOSIS — J069 Acute upper respiratory infection, unspecified: Secondary | ICD-10-CM

## 2017-12-06 DIAGNOSIS — J069 Acute upper respiratory infection, unspecified: Secondary | ICD-10-CM | POA: Diagnosis not present

## 2017-12-06 DIAGNOSIS — R82998 Other abnormal findings in urine: Secondary | ICD-10-CM | POA: Diagnosis not present

## 2017-12-06 LAB — URINALYSIS, ROUTINE W REFLEX MICROSCOPIC
BACTERIA UA: NONE SEEN /HPF
Bilirubin Urine: NEGATIVE
Glucose, UA: NEGATIVE
Hyaline Cast: NONE SEEN /LPF
Ketones, ur: NEGATIVE
NITRITE: NEGATIVE
PROTEIN: NEGATIVE
Specific Gravity, Urine: 1.025 (ref 1.001–1.03)
pH: 6.5 (ref 5.0–8.0)

## 2017-12-09 ENCOUNTER — Encounter: Payer: Self-pay | Admitting: Family Medicine

## 2017-12-09 ENCOUNTER — Other Ambulatory Visit: Payer: Self-pay | Admitting: *Deleted

## 2017-12-09 DIAGNOSIS — R319 Hematuria, unspecified: Secondary | ICD-10-CM

## 2017-12-09 LAB — EPSTEIN-BARR VIRUS VCA ANTIBODY PANEL
EBV NA IgG: 56.9 U/mL — ABNORMAL HIGH
EBV VCA IgG: 358 U/mL — ABNORMAL HIGH

## 2017-12-10 LAB — CMV ABS, IGG+IGM (CYTOMEGALOVIRUS)
CMV IgM: <30 AU/mL
Cytomegalovirus Ab-IgG: 3.5 U/mL — ABNORMAL HIGH

## 2017-12-11 ENCOUNTER — Encounter: Payer: Self-pay | Admitting: Family Medicine

## 2017-12-18 ENCOUNTER — Encounter: Payer: Self-pay | Admitting: Family Medicine

## 2017-12-18 ENCOUNTER — Ambulatory Visit: Payer: BLUE CROSS/BLUE SHIELD | Admitting: Family Medicine

## 2017-12-18 VITALS — BP 118/78 | HR 100 | Ht 65.0 in | Wt 290.0 lb

## 2017-12-18 DIAGNOSIS — J0100 Acute maxillary sinusitis, unspecified: Secondary | ICD-10-CM | POA: Diagnosis not present

## 2017-12-18 DIAGNOSIS — D72829 Elevated white blood cell count, unspecified: Secondary | ICD-10-CM

## 2017-12-18 MED ORDER — AMOXICILLIN-POT CLAVULANATE 875-125 MG PO TABS: 1.0000 | ORAL_TABLET | Freq: Two times a day (BID) | ORAL | 0 refills | Status: DC

## 2017-12-18 MED ORDER — FLUCONAZOLE 150 MG PO TABS: 150.0000 mg | ORAL_TABLET | Freq: Once | ORAL | 0 refills | Status: AC

## 2017-12-18 NOTE — Progress Notes (Signed)
   Subjective:    Patient ID: Barbara Mcmahon, female    DOB: 07/29/1976, 42 y.o.   MRN: [id number]  HPI 42 year old female comes in today to follow-up for recent upper respiratory symptoms.  He is coming back in today because she is not feeling tremendously better.  When I saw her 3 weeks ago she had Artie been having 10 days of swelling over her upper body and persistent cough.  She feels very fatigued and achey.  NO fever, chills or sweats.  She was seen about 1 week ago and was given tessalon pearles and prednisone for viral URI and cough.  At the time she had a productive cough, chest congestion, nasal congestion and low-grade temperature. She is now having more sinus contestion, post nasal drip. Pain over her cheeck under her eyes. Getting yellow/green mucous. She was feeling that her lymph nodes in her neck and down into her upper arms and chest were swelling.  They just feel tender and sore.  She has not had any recent fevers.  She is still having pain in her upper arms which is why she is coming back in today.  She tested negative for CMV and EBV.  She did have a slight bump in her white blood cell count at 12.2.  Most consistent with a viral etiology.   Review of Systems     Objective:   Physical Exam  Constitutional: She is oriented to person, place, and time. She appears well-developed and well-nourished.  HENT:  Head: Normocephalic and atraumatic.  Right Ear: External ear normal.  Left Ear: External ear normal.  Nose: Nose normal.  Mouth/Throat: Oropharynx is clear and moist.  TMs and canals are clear.   Eyes: Conjunctivae and EOM are normal. Pupils are equal, round, and reactive to light.  Neck: Neck supple. No thyromegaly present.  Cardiovascular: Normal rate, regular rhythm and normal heart sounds.  Pulmonary/Chest: Effort normal and breath sounds normal. She has no wheezes.  Lymphadenopathy:    She has no cervical adenopathy.  Neurological: She is alert and oriented to  person, place, and time.  Skin: Skin is warm and dry.  Psychiatric: She has a normal mood and affect.       Assessment & Plan:  Acute sinusitis -we will treat with Augmentin.  She also requested a prescription for Diflucan as she often gets a yeast infection with antibiotic use.  She is also eating yogurt regularly which is also helpful.  Call if not significantly better in 1 week.  If she is not better than identified we will recheck her CBC to make sure her white blood cell count has returned to normal.

## 2017-12-20 ENCOUNTER — Ambulatory Visit: Payer: BLUE CROSS/BLUE SHIELD | Admitting: Sports Medicine

## 2017-12-20 ENCOUNTER — Encounter: Payer: Self-pay | Admitting: Sports Medicine

## 2017-12-20 DIAGNOSIS — M47816 Spondylosis without myelopathy or radiculopathy, lumbar region: Secondary | ICD-10-CM

## 2017-12-20 MED ORDER — DULOXETINE HCL 30 MG PO CPEP: 30.0000 mg | ORAL_CAPSULE | Freq: Every day | ORAL | 3 refills | Status: DC

## 2017-12-20 MED ORDER — VENLAFAXINE HCL ER 37.5 MG PO CP24: ORAL_CAPSULE | ORAL | 0 refills | Status: DC

## 2017-12-20 NOTE — Assessment & Plan Note (Signed)
No response to multilevel facet joint injections, not even temporary, she did have bilateral SI joint injections in November 2018 that provided good relief, the right side became pain free, the left side only had a bit of pain. Unable to do NSAIDs due to current anticoagulant. Allergic to tramadol. We are going to down taper her from Effexor and start Cymbalta, it is a better SNRI than Effexor. This will probably help her pain, if persistent pain after the due diligence of taking Effexor up to a middle to the maximum dose we will proceed with an additional set of bilateral sacroiliac joint injections. Robaxin has been ineffective, discontinue this, again, sleeve gastrectomy and 100 pound weight loss will probably fix all of the above problems. Return to see me in 1 month.

## 2017-12-20 NOTE — Progress Notes (Signed)
Subjective:    CC: Follow-up  HPI: Low back pain: Traced to the left and right sacroiliac joint, good response to initial SI joint injection back in November 2018.  Still having some pain, agreeable to try other pharmacologic interventions.  I reviewed the past medical history, family history, social history, surgical history, and allergies today and no changes were needed.  Please see the problem list section below in epic for further details.  Past Medical History: Past Medical History:  Diagnosis Date  . Abnormal Pap smear and cervical HPV (human papillomavirus)   . Generalized anxiety disorder   . IBS (irritable bowel syndrome)   . PCOS (polycystic ovarian syndrome)   . Seizure disorder [facility])    Past Surgical History: Past Surgical History:  Procedure Laterality Date  . CHOLECYSTECTOMY    . COLPOSCOPY    . DILATION AND CURETTAGE OF UTERUS    . GYNECOLOGIC CRYOSURGERY    . KNEE SURGERY  right   Social History: Social History   Socioeconomic History  . Marital status: Married    Spouse name: None  . Number of children: None  . Years of education: None  . Highest education level: None  Social Needs  . Financial resource strain: None  . Food insecurity - worry: None  . Food insecurity - inability: None  . Transportation needs - medical: None  . Transportation needs - non-medical: None  Occupational History  . Occupation: Engineering geologist.   Tobacco Use  . Smoking status: Never Smoker  . Smokeless tobacco: Never Used  Substance and Sexual Activity  . Alcohol use: No  . Drug use: No  . Sexual activity: Not Currently  Other Topics Concern  . None  Social History Narrative  . None   Family History: Family History  Problem Relation Age of Onset  . Hypertension Father   . Diabetes Paternal Grandmother   . Hypertension Paternal Grandmother   . Stroke Paternal Grandmother   . Diabetes Paternal Grandfather   . Hypertension Paternal Grandfather   . Stroke  Paternal Grandfather   . Hypertension Mother    Allergies: Allergies  Allergen Reactions  . Morphine And Related Other (See Comments), Hives and Rash    Big mood changes BEHAVIOR CHANGES   . Shellfish Allergy Hives, Swelling and Anaphylaxis    Throat closes  . Codeine Hives and Itching    Pt says she can take percocet  . Cinnamon   . Tramadol Itching  . Robitussin A-C [Guaifenesin-Codeine] Itching  . Yeast-Related Products Hives   Medications: See med rec.  Review of Systems: No fevers, chills, night sweats, weight loss, chest pain, or shortness of breath.   Objective:    General: Well Developed, well nourished, and in no acute distress.  Neuro: Alert and oriented x3, extra-ocular muscles intact, sensation grossly intact.  HEENT: Normocephalic, atraumatic, pupils equal round reactive to light, neck supple, no masses, no lymphadenopathy, thyroid nonpalpable.  Skin: Warm and dry, no rashes. Cardiac: Regular rate and rhythm, no murmurs rubs or gallops, no lower extremity edema.  Respiratory: Clear to auscultation bilaterally. Not using accessory muscles, speaking in full sentences.  Impression and Recommendations:    Lumbar spondylosis No response to multilevel facet joint injections, not even temporary, she did have bilateral SI joint injections in November 2018 that provided good relief, the right side became pain free, the left side only had a bit of pain. Unable to do NSAIDs due to current anticoagulant. Allergic to tramadol. We are going to  down taper her from Effexor and start Cymbalta, it is a better SNRI than Effexor. This will probably help her pain, if persistent pain after the due diligence of taking Effexor up to a middle to the maximum dose we will proceed with an additional set of bilateral sacroiliac joint injections. Robaxin has been ineffective, discontinue this, again, sleeve gastrectomy and 100 pound weight loss will probably fix all of the above  problems. Return to see me in 1 month.  I spent 25 minutes with this patient, greater than 50% was face-to-face time counseling regarding the above diagnoses ___________________________________________ Gwen Her. Dianah Field, M.D., ABFM., CAQSM. Primary Care and [place] Instructor of [place] [facility]

## 2017-12-23 ENCOUNTER — Other Ambulatory Visit: Payer: Self-pay | Admitting: *Deleted

## 2017-12-23 MED ORDER — IPRATROPIUM BROMIDE 0.06 % NA SOLN: 2.0000 | Freq: Four times a day (QID) | NASAL | 3 refills | Status: DC

## 2017-12-24 ENCOUNTER — Other Ambulatory Visit: Payer: Self-pay

## 2017-12-24 DIAGNOSIS — R0602 Shortness of breath: Secondary | ICD-10-CM

## 2017-12-24 MED ORDER — APIXABAN 5 MG PO TABS: ORAL_TABLET | ORAL | 0 refills | Status: DC

## 2018-01-03 DIAGNOSIS — J301 Allergic rhinitis due to pollen: Secondary | ICD-10-CM | POA: Diagnosis not present

## 2018-01-10 DIAGNOSIS — J301 Allergic rhinitis due to pollen: Secondary | ICD-10-CM | POA: Diagnosis not present

## 2018-01-14 ENCOUNTER — Other Ambulatory Visit: Payer: Self-pay

## 2018-01-14 DIAGNOSIS — M47816 Spondylosis without myelopathy or radiculopathy, lumbar region: Secondary | ICD-10-CM

## 2018-01-14 MED ORDER — DULOXETINE HCL 30 MG PO CPEP: 30.0000 mg | ORAL_CAPSULE | Freq: Every day | ORAL | 1 refills | Status: DC

## 2018-01-17 ENCOUNTER — Encounter: Payer: Self-pay | Admitting: Sports Medicine

## 2018-01-17 ENCOUNTER — Other Ambulatory Visit: Payer: Self-pay | Admitting: *Deleted

## 2018-01-17 ENCOUNTER — Ambulatory Visit: Payer: BLUE CROSS/BLUE SHIELD | Admitting: Sports Medicine

## 2018-01-17 ENCOUNTER — Other Ambulatory Visit: Payer: Self-pay | Admitting: Family Medicine

## 2018-01-17 DIAGNOSIS — M47816 Spondylosis without myelopathy or radiculopathy, lumbar region: Secondary | ICD-10-CM

## 2018-01-17 DIAGNOSIS — J301 Allergic rhinitis due to pollen: Secondary | ICD-10-CM | POA: Diagnosis not present

## 2018-01-17 DIAGNOSIS — F419 Anxiety disorder, unspecified: Secondary | ICD-10-CM

## 2018-01-17 MED ORDER — DULOXETINE HCL 60 MG PO CPEP: 60.0000 mg | ORAL_CAPSULE | Freq: Every day | ORAL | 3 refills | Status: DC

## 2018-01-17 NOTE — Progress Notes (Signed)
Subjective:    CC: Follow-up  HPI: Mata returns, she is a pleasant 42 year old female, she has known lumbar spondylosis, SI joint degenerative changes, she did well after a sacroiliac joint injection, subsequently we decided to pursue more of a pharmacologic approach, I switched her from Effexor to Cymbalta at the last visit and she has done extremely well with near complete relief of her back pain.  She still has a bit of pain and is agreeable to go up on the dose.  I reviewed the past medical history, family history, social history, surgical history, and allergies today and no changes were needed.  Please see the problem list section below in epic for further details.  Past Medical History: Past Medical History:  Diagnosis Date  . Abnormal Pap smear and cervical HPV (human papillomavirus)   . Generalized anxiety disorder   . IBS (irritable bowel syndrome)   . PCOS (polycystic ovarian syndrome)   . Seizure disorder [facility])    Past Surgical History: Past Surgical History:  Procedure Laterality Date  . CHOLECYSTECTOMY    . COLPOSCOPY    . DILATION AND CURETTAGE OF UTERUS    . GYNECOLOGIC CRYOSURGERY    . KNEE SURGERY  right   Social History: Social History   Socioeconomic History  . Marital status: Married    Spouse name: Not on file  . Number of children: Not on file  . Years of education: Not on file  . Highest education level: Not on file  Occupational History  . Occupation: Engineering geologist.   Social Needs  . Financial resource strain: Not on file  . Food insecurity:    Worry: Not on file    Inability: Not on file  . Transportation needs:    Medical: Not on file    Non-medical: Not on file  Tobacco Use  . Smoking status: Never Smoker  . Smokeless tobacco: Never Used  Substance and Sexual Activity  . Alcohol use: No  . Drug use: No  . Sexual activity: Not Currently  Lifestyle  . Physical activity:    Days per week: Not on file    Minutes per session:  Not on file  . Stress: Not on file  Relationships  . Social connections:    Talks on phone: Not on file    Gets together: Not on file    Attends religious service: Not on file    Active member of club or organization: Not on file    Attends meetings of clubs or organizations: Not on file    Relationship status: Not on file  Other Topics Concern  . Not on file  Social History Narrative  . Not on file   Family History: Family History  Problem Relation Age of Onset  . Hypertension Father   . Diabetes Paternal Grandmother   . Hypertension Paternal Grandmother   . Stroke Paternal Grandmother   . Diabetes Paternal Grandfather   . Hypertension Paternal Grandfather   . Stroke Paternal Grandfather   . Hypertension Mother    Allergies: Allergies  Allergen Reactions  . Morphine And Related Other (See Comments), Hives and Rash    Big mood changes BEHAVIOR CHANGES   . Shellfish Allergy Hives, Swelling and Anaphylaxis    Throat closes  . Codeine Hives and Itching    Pt says she can take percocet  . Cinnamon   . Tramadol Itching  . Robitussin A-C [Guaifenesin-Codeine] Itching  . Yeast-Related Products Hives   Medications: See med rec.  Review of Systems: No fevers, chills, night sweats, weight loss, chest pain, or shortness of breath.   Objective:    General: Well Developed, well nourished, and in no acute distress.  Neuro: Alert and oriented x3, extra-ocular muscles intact, sensation grossly intact.  HEENT: Normocephalic, atraumatic, pupils equal round reactive to light, neck supple, no masses, no lymphadenopathy, thyroid nonpalpable.  Skin: Warm and dry, no rashes. Cardiac: Regular rate and rhythm, no murmurs rubs or gallops, no lower extremity edema.  Respiratory: Clear to auscultation bilaterally. Not using accessory muscles, speaking in full sentences.  Impression and Recommendations:    Lumbar spondylosis No response to multilevel facet joint injections, not even  temporary, bilateral SI joint injections in November 2018 provided good relief. Unable to use NSAIDs, currently on anticoagulants for PE. Allergic to tramadol. We switched her from Effexor to Cymbalta at the last visit, and she has had a good response, increasing to 60 mg. Return to see me in 1 month. If after giving the due diligence of wrapping to the maximum dose we will discuss repeat injections, also I think a sleeve gastrectomy and a 100 pound weight loss will fix all of the above problems.  I spent 25 minutes with this patient, greater than 50% was face-to-face time counseling regarding the above diagnoses ___________________________________________ Gwen Her. Dianah Field, M.D., ABFM., CAQSM. Primary Care and [place] Instructor of [place] of [facility] of Medicine

## 2018-01-17 NOTE — Assessment & Plan Note (Signed)
No response to multilevel facet joint injections, not even temporary, bilateral SI joint injections in November 2018 provided good relief. Unable to use NSAIDs, currently on anticoagulants for PE. Allergic to tramadol. We switched her from Effexor to Cymbalta at the last visit, and she has had a good response, increasing to 60 mg. Return to see me in 1 month. If after giving the due diligence of wrapping to the maximum dose we will discuss repeat injections, also I think a sleeve gastrectomy and a 100 pound weight loss will fix all of the above problems.

## 2018-01-21 DIAGNOSIS — J301 Allergic rhinitis due to pollen: Secondary | ICD-10-CM | POA: Diagnosis not present

## 2018-01-31 DIAGNOSIS — J301 Allergic rhinitis due to pollen: Secondary | ICD-10-CM | POA: Diagnosis not present

## 2018-02-01 ENCOUNTER — Encounter: Payer: Self-pay | Admitting: Sports Medicine

## 2018-02-07 DIAGNOSIS — J301 Allergic rhinitis due to pollen: Secondary | ICD-10-CM | POA: Diagnosis not present

## 2018-02-11 ENCOUNTER — Other Ambulatory Visit: Payer: Self-pay | Admitting: Sports Medicine

## 2018-02-11 DIAGNOSIS — M47816 Spondylosis without myelopathy or radiculopathy, lumbar region: Secondary | ICD-10-CM

## 2018-02-14 DIAGNOSIS — J301 Allergic rhinitis due to pollen: Secondary | ICD-10-CM | POA: Diagnosis not present

## 2018-02-20 DIAGNOSIS — J301 Allergic rhinitis due to pollen: Secondary | ICD-10-CM | POA: Diagnosis not present

## 2018-02-21 ENCOUNTER — Ambulatory Visit: Payer: BLUE CROSS/BLUE SHIELD | Admitting: Sports Medicine

## 2018-02-28 ENCOUNTER — Ambulatory Visit: Payer: BLUE CROSS/BLUE SHIELD | Admitting: Sports Medicine

## 2018-02-28 DIAGNOSIS — J301 Allergic rhinitis due to pollen: Secondary | ICD-10-CM | POA: Diagnosis not present

## 2018-03-07 ENCOUNTER — Other Ambulatory Visit: Payer: Self-pay | Admitting: Sports Medicine

## 2018-03-07 ENCOUNTER — Encounter: Payer: Self-pay | Admitting: Sports Medicine

## 2018-03-07 ENCOUNTER — Ambulatory Visit: Payer: BLUE CROSS/BLUE SHIELD | Admitting: Sports Medicine

## 2018-03-07 DIAGNOSIS — M533 Sacrococcygeal disorders, not elsewhere classified: Secondary | ICD-10-CM

## 2018-03-07 DIAGNOSIS — J301 Allergic rhinitis due to pollen: Secondary | ICD-10-CM | POA: Diagnosis not present

## 2018-03-07 DIAGNOSIS — R0602 Shortness of breath: Secondary | ICD-10-CM

## 2018-03-07 MED ORDER — DULOXETINE HCL 30 MG PO CPEP: 30.0000 mg | ORAL_CAPSULE | Freq: Every day | ORAL | 3 refills | Status: DC

## 2018-03-07 NOTE — Patient Instructions (Signed)
Sacroiliac Joint Dysfunction Sacroiliac joint dysfunction is a condition that causes inflammation on one or both sides of the sacroiliac (SI) joint. The SI joint connects the lower part of the spine (sacrum) with the two upper portions of the pelvis (ilium). This condition causes deep aching or burning pain in the low back. In some cases, the pain may also spread into one or both buttocks or hips or spread down the legs. What are the causes? This condition may be caused by:  Pregnancy. During pregnancy, extra stress is put on the SI joints because the pelvis widens.  Injury, such as: ? Car accidents. ? Sport-related injuries. ? Work-related injuries.  Having one leg that is shorter than the other.  Conditions that affect the joints, such as: ? Rheumatoid arthritis. ? Gout. ? Psoriatic arthritis. ? Joint infection (septic arthritis).  Sometimes, the cause of SI joint dysfunction is not known. What are the signs or symptoms? Symptoms of this condition include:  Aching or burning pain in the lower back. The pain may also spread to other areas, such as: ? Buttocks. ? Groin. ? Thighs and legs.  Muscle spasms in or around the painful areas.  Increased pain when standing, walking, running, stair climbing, bending, or lifting.  How is this diagnosed? Your health care provider will do a physical exam and take your medical history. During the exam, the health care provider may move one or both of your legs to different positions to check for pain. Various tests may be done to help verify the diagnosis, including:  Imaging tests to look for other causes of pain. These may include: ? MRI. ? CT scan. ? Bone scan.  Diagnostic injection. A numbing medicine is injected into the SI joint using a needle. If the pain is temporarily improved or stopped after the injection, this can indicate that SI joint dysfunction is the problem.  How is this treated? Treatment may vary depending on the  cause and severity of your condition. Treatment options may include:  Applying ice or heat to the lower back area. This can help to reduce pain and muscle spasms.  Medicines to relieve pain or inflammation or to relax the muscles.  Wearing a back brace (sacroiliac brace) to help support the joint while your back is healing.  Physical therapy to increase muscle strength around the joint and flexibility at the joint. This may also involve learning proper body positions and ways of moving to relieve stress on the joint.  Direct manipulation of the SI joint.  Injections of steroid medicine into the joint in order to reduce pain and swelling.  Radiofrequency ablation to burn away nerves that are carrying pain messages from the joint.  Use of a device that provides electrical stimulation in order to reduce pain at the joint.  Surgery to put in screws and plates that limit or prevent joint motion. This is rare.  Follow these instructions at home:  Rest as needed. Limit your activities as directed by your health care provider.  Take medicines only as directed by your health care provider.  If directed, apply ice to the affected area: ? Put ice in a plastic bag. ? Place a towel between your skin and the bag. ? Leave the ice on for 20 minutes, 2-3 times per day.  Use a heating pad or a moist heat pack as directed by your health care provider.  Exercise as directed by your health care provider or physical therapist.  Keep all follow-up visits   as directed by your health care provider. This is important. Contact a health care provider if:  Your pain is not controlled with medicine.  You have a fever.  You have increasingly severe pain. Get help right away if:  You have weakness, numbness, or tingling in your legs or feet.  You lose control of your bladder or bowel. This information is not intended to replace advice given to you by your health care provider. Make sure you discuss  any questions you have with your health care provider. Document Released: 12/21/2008 Document Revised: 03/01/2016 Document Reviewed: 06/01/2014 Elsevier Interactive Patient Education  2018 Elsevier Inc.  

## 2018-03-07 NOTE — Progress Notes (Signed)
Subjective:    CC: Back pain  HPI: Barbara Mcmahon is a very pleasant 42 year old female with a history of pulmonary embolism, I have been treating her for sacroiliac joint dysfunction and myofascial pain syndrome, her last injection was back in 2018 sometime, she initially responded very well to 30 mg of duloxetine, increased to 60 mg and she was pain-free for several months.  Now having a recurrence of pain.  I reviewed the past medical history, family history, social history, surgical history, and allergies today and no changes were needed.  Please see the problem list section below in epic for further details.  Past Medical History: Past Medical History:  Diagnosis Date  . Abnormal Pap smear and cervical HPV (human papillomavirus)   . Generalized anxiety disorder   . IBS (irritable bowel syndrome)   . PCOS (polycystic ovarian syndrome)   . Seizure disorder [facility])    Past Surgical History: Past Surgical History:  Procedure Laterality Date  . CHOLECYSTECTOMY    . COLPOSCOPY    . DILATION AND CURETTAGE OF UTERUS    . GYNECOLOGIC CRYOSURGERY    . KNEE SURGERY  right   Social History: Social History   Socioeconomic History  . Marital status: Married    Spouse name: Not on file  . Number of children: Not on file  . Years of education: Not on file  . Highest education level: Not on file  Occupational History  . Occupation: Engineering geologist.   Social Needs  . Financial resource strain: Not on file  . Food insecurity:    Worry: Not on file    Inability: Not on file  . Transportation needs:    Medical: Not on file    Non-medical: Not on file  Tobacco Use  . Smoking status: Never Smoker  . Smokeless tobacco: Never Used  Substance and Sexual Activity  . Alcohol use: No  . Drug use: No  . Sexual activity: Not Currently  Lifestyle  . Physical activity:    Days per week: Not on file    Minutes per session: Not on file  . Stress: Not on file  Relationships  . Social  connections:    Talks on phone: Not on file    Gets together: Not on file    Attends religious service: Not on file    Active member of club or organization: Not on file    Attends meetings of clubs or organizations: Not on file    Relationship status: Not on file  Other Topics Concern  . Not on file  Social History Narrative  . Not on file   Family History: Family History  Problem Relation Age of Onset  . Hypertension Father   . Diabetes Paternal Grandmother   . Hypertension Paternal Grandmother   . Stroke Paternal Grandmother   . Diabetes Paternal Grandfather   . Hypertension Paternal Grandfather   . Stroke Paternal Grandfather   . Hypertension Mother    Allergies: Allergies  Allergen Reactions  . Morphine And Related Other (See Comments), Hives and Rash    Big mood changes BEHAVIOR CHANGES   . Shellfish Allergy Hives, Swelling and Anaphylaxis    Throat closes  . Codeine Hives and Itching    Pt says she can take percocet  . Cinnamon   . Tramadol Itching  . Robitussin A-C [Guaifenesin-Codeine] Itching  . Yeast-Related Products Hives   Medications: See med rec.  Review of Systems: No fevers, chills, night sweats, weight loss, chest pain, or shortness  of breath.   Objective:    General: Well Developed, well nourished, and in no acute distress.  Neuro: Alert and oriented x3, extra-ocular muscles intact, sensation grossly intact.  HEENT: Normocephalic, atraumatic, pupils equal round reactive to light, neck supple, no masses, no lymphadenopathy, thyroid nonpalpable.  Skin: Warm and dry, no rashes. Cardiac: Regular rate and rhythm, no murmurs rubs or gallops, no lower extremity edema.  Respiratory: Clear to auscultation bilaterally. Not using accessory muscles, speaking in full sentences.  Impression and Recommendations:    SI (sacroiliac) joint dysfunction No response to multilevel facet joint injections, not even temporary, bilateral SI joint injections in  November 2018 provided good relief. No NSAIDs, currently on anticoagulants for pulmonary embolism. Allergy to tramadol. Good response with the switch to Cymbalta, and the increased to 60 mg at the last visit, this had a temporary response, increasing to 90 mg. In 1 month if still symptomatic we will increase to 120 before considering SI joint radiofrequency ablation.  I spent 25 minutes with this patient, greater than 50% was face-to-face time counseling regarding the above diagnoses ___________________________________________ Gwen Her. Dianah Field, M.D., ABFM., CAQSM. Primary Care and [place] Instructor of [place] of [facility] of Medicine

## 2018-03-07 NOTE — Assessment & Plan Note (Signed)
No response to multilevel facet joint injections, not even temporary, bilateral SI joint injections in November 2018 provided good relief. No NSAIDs, currently on anticoagulants for pulmonary embolism. Allergy to tramadol. Good response with the switch to Cymbalta, and the increased to 60 mg at the last visit, this had a temporary response, increasing to 90 mg. In 1 month if still symptomatic we will increase to 120 before considering SI joint radiofrequency ablation.

## 2018-03-10 MED ORDER — APIXABAN 5 MG PO TABS: ORAL_TABLET | ORAL | 0 refills | Status: DC

## 2018-03-14 DIAGNOSIS — J301 Allergic rhinitis due to pollen: Secondary | ICD-10-CM | POA: Diagnosis not present

## 2018-03-21 DIAGNOSIS — J301 Allergic rhinitis due to pollen: Secondary | ICD-10-CM | POA: Diagnosis not present

## 2018-03-28 DIAGNOSIS — J301 Allergic rhinitis due to pollen: Secondary | ICD-10-CM | POA: Diagnosis not present

## 2018-04-04 ENCOUNTER — Ambulatory Visit: Payer: BLUE CROSS/BLUE SHIELD | Admitting: Sports Medicine

## 2018-04-04 DIAGNOSIS — J301 Allergic rhinitis due to pollen: Secondary | ICD-10-CM | POA: Diagnosis not present

## 2018-04-11 ENCOUNTER — Ambulatory Visit: Payer: BLUE CROSS/BLUE SHIELD | Admitting: Sports Medicine

## 2018-04-11 DIAGNOSIS — J301 Allergic rhinitis due to pollen: Secondary | ICD-10-CM | POA: Diagnosis not present

## 2018-04-15 ENCOUNTER — Ambulatory Visit: Payer: BLUE CROSS/BLUE SHIELD | Admitting: Sports Medicine

## 2018-04-15 DIAGNOSIS — M533 Sacrococcygeal disorders, not elsewhere classified: Secondary | ICD-10-CM | POA: Diagnosis not present

## 2018-04-15 NOTE — Progress Notes (Signed)
Subjective:    CC: Back pain  HPI: Barbara Mcmahon is a pleasant 42 year old female, back in November 2018 we injected both SI joints, more recently she had a recurrence of pain, left worse than right, she desires a left SI joint injection today, we also added Cymbalta as she was having some mood complaints as well as myofascial type pain.  We slowly tapered her up from 30 to 60 mg, at the last visit we had recommended an increase to 90 but she simply went straight to 120, has not noticed any improvement at 120 compared to when she was at 59.  I reviewed the past medical history, family history, social history, surgical history, and allergies today and no changes were needed.  Please see the problem list section below in epic for further details.  Past Medical History: Past Medical History:  Diagnosis Date  . Abnormal Pap smear and cervical HPV (human papillomavirus)   . Generalized anxiety disorder   . IBS (irritable bowel syndrome)   . PCOS (polycystic ovarian syndrome)   . Seizure disorder [facility])    Past Surgical History: Past Surgical History:  Procedure Laterality Date  . CHOLECYSTECTOMY    . COLPOSCOPY    . DILATION AND CURETTAGE OF UTERUS    . GYNECOLOGIC CRYOSURGERY    . KNEE SURGERY  right   Social History: Social History   Socioeconomic History  . Marital status: Married    Spouse name: Not on file  . Number of children: Not on file  . Years of education: Not on file  . Highest education level: Not on file  Occupational History  . Occupation: Engineering geologist.   Social Needs  . Financial resource strain: Not on file  . Food insecurity:    Worry: Not on file    Inability: Not on file  . Transportation needs:    Medical: Not on file    Non-medical: Not on file  Tobacco Use  . Smoking status: Never Smoker  . Smokeless tobacco: Never Used  Substance and Sexual Activity  . Alcohol use: No  . Drug use: No  . Sexual activity: Not Currently  Lifestyle  . Physical  activity:    Days per week: Not on file    Minutes per session: Not on file  . Stress: Not on file  Relationships  . Social connections:    Talks on phone: Not on file    Gets together: Not on file    Attends religious service: Not on file    Active member of club or organization: Not on file    Attends meetings of clubs or organizations: Not on file    Relationship status: Not on file  Other Topics Concern  . Not on file  Social History Narrative  . Not on file   Family History: Family History  Problem Relation Age of Onset  . Hypertension Father   . Diabetes Paternal Grandmother   . Hypertension Paternal Grandmother   . Stroke Paternal Grandmother   . Diabetes Paternal Grandfather   . Hypertension Paternal Grandfather   . Stroke Paternal Grandfather   . Hypertension Mother    Allergies: Allergies  Allergen Reactions  . Morphine And Related Other (See Comments), Hives and Rash    Big mood changes BEHAVIOR CHANGES   . Shellfish Allergy Hives, Swelling and Anaphylaxis    Throat closes  . Codeine Hives and Itching    Pt says she can take percocet  . Cinnamon   . Tramadol  Itching  . Robitussin A-C [Guaifenesin-Codeine] Itching  . Yeast-Related Products Hives   Medications: See med rec.  Review of Systems: No fevers, chills, night sweats, weight loss, chest pain, or shortness of breath.   Objective:    General: Well Developed, well nourished, and in no acute distress.  Neuro: Alert and oriented x3, extra-ocular muscles intact, sensation grossly intact.  HEENT: Normocephalic, atraumatic, pupils equal round reactive to light, neck supple, no masses, no lymphadenopathy, thyroid nonpalpable.  Skin: Warm and dry, no rashes. Cardiac: Regular rate and rhythm, no murmurs rubs or gallops, no lower extremity edema.  Respiratory: Clear to auscultation bilaterally. Not using accessory muscles, speaking in full sentences. Back Exam:  Inspection: Unremarkable  Motion: Flexion  45 deg, Extension 45 deg, Side Bending to 45 deg bilaterally,  Rotation to 45 deg bilaterally  SLR laying: Negative  XSLR laying: Negative  Palpable tenderness: Left sacroiliac joint. FABER: negative. Sensory change: Gross sensation intact to all lumbar and sacral dermatomes.  Reflexes: 2+ at both patellar tendons, 2+ at achilles tendons, Babinski's downgoing.  Strength at foot  Plantar-flexion: 5/5 Dorsi-flexion: 5/5 Eversion: 5/5 Inversion: 5/5  Leg strength  Quad: 5/5 Hamstring: 5/5 Hip flexor: 5/5 Hip abductors: 5/5  Gait unremarkable.  Procedure: Real-time Ultrasound Guided Injection of left sacroiliac joint  device: GE Logiq E  Verbal informed consent obtained.  Time-out conducted.  Noted no overlying erythema, induration, or other signs of local infection.  Skin prepped in a sterile fashion.  Local anesthesia: Topical Ethyl chloride.  With sterile technique and under real time ultrasound guidance: 22-gauge spinal needle advanced into the SI joint on the left, injected 1 cc: 40, 2 cc lidocaine, 2 cc bupivacaine. Completed without difficulty  Pain immediately resolved suggesting accurate placement of the medication.  Advised to call if fevers/chills, erythema, induration, drainage, or persistent bleeding.  Images permanently stored and available for review in the ultrasound unit.  Impression: Technically successful ultrasound guided injection.  Impression and Recommendations:    SI (sacroiliac) joint dysfunction No response, not even temporary to multilevel facet joint injections. Last set of injections into the SI joints were bilateral in November 2018 and provided good relief until recently, pain is now on the left side, we did a repeat left sacroiliac joint injection today. The increase to 120 of Cymbalta did not provide any efficacy beyond 60, dropping back down to 60 mg. Return as needed. She does have some paperwork that she would need me to fill out for her bariatric  surgery which I am happy to do. ___________________________________________ Gwen Her. Dianah Field, M.D., ABFM., CAQSM. Primary Care and [place] Instructor of [place] of [facility] of Medicine

## 2018-04-15 NOTE — Assessment & Plan Note (Signed)
No response, not even temporary to multilevel facet joint injections. Last set of injections into the SI joints were bilateral in November 2018 and provided good relief until recently, pain is now on the left side, we did a repeat left sacroiliac joint injection today. The increase to 120 of Cymbalta did not provide any efficacy beyond 60, dropping back down to 60 mg. Return as needed. She does have some paperwork that she would need me to fill out for her bariatric surgery which I am happy to do.

## 2018-04-18 DIAGNOSIS — J301 Allergic rhinitis due to pollen: Secondary | ICD-10-CM | POA: Diagnosis not present

## 2018-04-25 DIAGNOSIS — J301 Allergic rhinitis due to pollen: Secondary | ICD-10-CM | POA: Diagnosis not present

## 2018-05-02 DIAGNOSIS — J301 Allergic rhinitis due to pollen: Secondary | ICD-10-CM | POA: Diagnosis not present

## 2018-05-06 ENCOUNTER — Emergency Department: Admission: EM | Admit: 2018-05-06 | Discharge: 2018-05-06 | Payer: BLUE CROSS/BLUE SHIELD | Source: Home / Self Care

## 2018-05-06 ENCOUNTER — Other Ambulatory Visit: Payer: Self-pay

## 2018-05-06 DIAGNOSIS — Z79899 Other long term (current) drug therapy: Secondary | ICD-10-CM | POA: Diagnosis not present

## 2018-05-06 DIAGNOSIS — R1031 Right lower quadrant pain: Secondary | ICD-10-CM | POA: Diagnosis not present

## 2018-05-06 DIAGNOSIS — K5900 Constipation, unspecified: Secondary | ICD-10-CM | POA: Diagnosis not present

## 2018-05-06 DIAGNOSIS — J0100 Acute maxillary sinusitis, unspecified: Secondary | ICD-10-CM | POA: Diagnosis not present

## 2018-05-06 DIAGNOSIS — Z7901 Long term (current) use of anticoagulants: Secondary | ICD-10-CM | POA: Diagnosis not present

## 2018-05-06 DIAGNOSIS — Z91013 Allergy to seafood: Secondary | ICD-10-CM | POA: Diagnosis not present

## 2018-05-06 DIAGNOSIS — K589 Irritable bowel syndrome without diarrhea: Secondary | ICD-10-CM | POA: Diagnosis not present

## 2018-05-06 DIAGNOSIS — Z86711 Personal history of pulmonary embolism: Secondary | ICD-10-CM | POA: Diagnosis not present

## 2018-05-06 DIAGNOSIS — Z885 Allergy status to narcotic agent status: Secondary | ICD-10-CM | POA: Diagnosis not present

## 2018-05-06 DIAGNOSIS — R112 Nausea with vomiting, unspecified: Secondary | ICD-10-CM | POA: Diagnosis not present

## 2018-05-06 DIAGNOSIS — Z91018 Allergy to other foods: Secondary | ICD-10-CM | POA: Diagnosis not present

## 2018-05-06 DIAGNOSIS — E282 Polycystic ovarian syndrome: Secondary | ICD-10-CM | POA: Diagnosis not present

## 2018-05-06 NOTE — ED Provider Notes (Signed)
[name]    CSN: [id number] Arrival date & time: 05/06/18  1942     History   Chief Complaint Chief Complaint  Patient presents with  . Recurrent Sinusitis  . Abdominal Pain    RT flank    HPI Barbara Mcmahon is a 42 y.o. female.   The history is provided by the patient. No language interpreter was used.  Abdominal Pain  Pain location:  RLQ Pain quality: aching   Pain radiates to:  Does not radiate Onset quality:  Gradual Duration:  4 days Timing:  Constant Progression:  Worsening Relieved by:  Nothing Worsened by:  Nothing Associated symptoms: no chest pain   Risk factors: no alcohol abuse   Pt complains of right lower abdominal pain worsening since Saturday.  Pt reports vomiting on Saturday.  Pt feels like she has a sinus infection.   Pt has a PE in the past and is on elquis.   Past Medical History:  Diagnosis Date  . Abnormal Pap smear and cervical HPV (human papillomavirus)   . Generalized anxiety disorder   . IBS (irritable bowel syndrome)   . PCOS (polycystic ovarian syndrome)   . Seizure disorder [facility])     Patient Active Problem List   Diagnosis Date Noted  . Pulmonary embolism ([place]) 07/04/2017  . 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 GAD (generalized anxiety disorder) 04/08/2017  . Shrimp allergy 09/24/2016  . 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 MRSA (methicillin resistant Staphylococcus aureus) 01/14/2015  . Family history of hypertrophic cardiomyopathy 04/23/2013  . Raynaud disease 11/19/2012  . Irritable bowel syndrome with diarrhea (colonoscopy 2007) 07/04/2012  . Obesity, morbid (more than 100 lbs over ideal weight or BMI > 40) ([place]) 06/30/2012  . Anxiety 06/30/2012  . PCOS (polycystic ovarian syndrome) 06/30/2012    Past Surgical History:  Procedure Laterality Date  . CHOLECYSTECTOMY    . COLPOSCOPY    . DILATION AND CURETTAGE OF UTERUS    . GYNECOLOGIC CRYOSURGERY    . KNEE SURGERY  right    OB History    Gravida  2   Para  1   Term  1   Preterm      AB  1   Living  1     SAB  1   TAB      Ectopic      Multiple      Live Births  1            Home Medications    Prior to Admission medications   Medication Sig Start Date End Date Taking? Authorizing Provider  acetaminophen (TYLENOL) 650 MG CR tablet Take 1 tablet (650 mg total) by mouth every 8 (eight) hours as needed for pain. 10/14/17   Silverio Decamp, MD  apixaban (ELIQUIS) 5 MG TABS tablet 5mg  PO BID x6 months. 03/10/18   Breeback, Jade L, PA-C  cetirizine (ZYRTEC) 10 MG tablet Take 10 mg by mouth daily.    [provider]  DULoxetine (CYMBALTA) 60 MG capsule Take 1 capsule (60 mg total) by mouth daily. 02/11/18   Silverio Decamp, MD  EPINEPHrine (EPI-PEN) 0.3 mg/0.3 mL SOAJ injection Inject 0.3 mLs (0.3 mg total) into the muscle once as needed (anaphylaxis). 11/18/13   Hommel, Hilliard Clark, DO  ipratropium (ATROVENT) 0.06 % nasal spray Place 2 sprays into both nostrils 4 (four) times daily. 12/23/17   Hali Marry, MD    Family History Family History  Problem Relation Age  of Onset  . Hypertension Father   . Diabetes Paternal Grandmother   . Hypertension Paternal Grandmother   . Stroke Paternal Grandmother   . Diabetes Paternal Grandfather   . Hypertension Paternal Grandfather   . Stroke Paternal Grandfather   . Hypertension Mother     Social History Social History   Tobacco Use  . Smoking status: Never Smoker  . Smokeless tobacco: Never Used  Substance Use Topics  . Alcohol use: No  . Drug use: No     Allergies   Morphine and related; Shellfish allergy; Codeine; Cinnamon; Tramadol; Robitussin a-c [guaifenesin-codeine]; and Yeast-related products   Review of Systems Review of Systems  Cardiovascular: Negative for chest pain.  Gastrointestinal: Positive for abdominal pain.  All other systems reviewed and are negative.    Physical Exam Triage Vital Signs ED Triage Vitals    Enc Vitals Group     BP 05/06/18 2004 131/82     Pulse Rate 05/06/18 2004 (!) 105     Resp --      Temp 05/06/18 2004 98 F (36.7 C)     Temp Source 05/06/18 2004 Oral     SpO2 05/06/18 2004 98 %     Weight 05/06/18 2005 288 lb (130.6 kg)     Height 05/06/18 2005 5\' 5"  (1.651 m)     Head Circumference --      Peak Flow --      Pain Score 05/06/18 2005 6     Pain Loc --      Pain Edu? --      Excl. in [place]? --    No data found.  Updated Vital Signs BP 131/82 (BP Location: Right Arm)   Pulse (!) 105   Temp 98 F (36.7 C) (Oral)   Ht 5\' 5"  (1.651 m)   Wt 288 lb (130.6 kg)   SpO2 98%   BMI 47.93 kg/m   Visual Acuity Right Eye Distance:   Left Eye Distance:   Bilateral Distance:    Right Eye Near:   Left Eye Near:    Bilateral Near:     Physical Exam  Constitutional: She is oriented to person, place, and time. She appears well-developed and well-nourished.  HENT:  Head: Normocephalic.  Eyes: EOM are normal.  Neck: Normal range of motion.  Cardiovascular: Normal rate.  Pulmonary/Chest: Effort normal.  Abdominal: Soft. Normal appearance. She exhibits no distension. There is tenderness in the right lower quadrant.  Tender right lower quadrant.   Musculoskeletal: Normal range of motion.  Neurological: She is alert and oriented to person, place, and time.  Skin: Skin is warm. Capillary refill takes less than 2 seconds.  Psychiatric: She has a normal mood and affect.  Nursing note and vitals reviewed.    UC Treatments / Results  Labs (all labs ordered are listed, but only abnormal results are displayed) Labs Reviewed - No data to display  EKG None  Radiology No results found.  Procedures Procedures (including critical care time)  Medications Ordered in UC Medications - No data to display  Initial Impression / Assessment and Plan / UC Course  I have reviewed the triage vital signs and the nursing notes.  Pertinent labs & imaging results that were  available during my care of the patient were reviewed by me and considered in my medical decision making (see chart for details).     MDM  I am concerned pt could have an appendicitis.  I do not have ct available.  Pt agrees to go to Emergency Department.  Pt wants to go to [facility].  Final Clinical Impressions(s) / UC Diagnoses   Final diagnoses:  Right lower quadrant abdominal pain     Discharge Instructions     Go to the Emergency Department for evaluation    ED Prescriptions    None     Controlled Substance Prescriptions [place] Controlled Substance Registry consulted? Not Applicable   Fransico Meadow, [place] 05/06/18 2019

## 2018-05-06 NOTE — ED Triage Notes (Signed)
Pt c/o sinus infection sxs since Saturday. Has hx of them. Pressure, headache, nasal congestion. Also has been experiencing radiating right sided abdominal pain which caused her to vomit on Sat. Also having vaginal spotting which she says is abnormal for her. She got Mirena in Oct. Pain level up to 7 at its worst.

## 2018-05-06 NOTE — Discharge Instructions (Signed)
Go to the Emergency Department for evaluation

## 2018-05-16 ENCOUNTER — Encounter: Payer: Self-pay | Admitting: Hematology & Oncology

## 2018-05-16 ENCOUNTER — Other Ambulatory Visit: Payer: Self-pay

## 2018-05-16 ENCOUNTER — Inpatient Hospital Stay: Payer: BLUE CROSS/BLUE SHIELD

## 2018-05-16 ENCOUNTER — Inpatient Hospital Stay: Payer: BLUE CROSS/BLUE SHIELD | Attending: Hematology & Oncology | Admitting: Hematology & Oncology

## 2018-05-16 VITALS — BP 141/72 | HR 83 | Temp 98.1°F | Resp 18 | Wt 289.0 lb

## 2018-05-16 DIAGNOSIS — L905 Scar conditions and fibrosis of skin: Secondary | ICD-10-CM | POA: Diagnosis not present

## 2018-05-16 DIAGNOSIS — J301 Allergic rhinitis due to pollen: Secondary | ICD-10-CM | POA: Diagnosis not present

## 2018-05-16 DIAGNOSIS — I2601 Septic pulmonary embolism with acute cor pulmonale: Secondary | ICD-10-CM

## 2018-05-16 DIAGNOSIS — I2782 Chronic pulmonary embolism: Secondary | ICD-10-CM

## 2018-05-16 DIAGNOSIS — I2699 Other pulmonary embolism without acute cor pulmonale: Secondary | ICD-10-CM | POA: Insufficient documentation

## 2018-05-16 DIAGNOSIS — L853 Xerosis cutis: Secondary | ICD-10-CM | POA: Diagnosis not present

## 2018-05-16 DIAGNOSIS — L918 Other hypertrophic disorders of the skin: Secondary | ICD-10-CM | POA: Diagnosis not present

## 2018-05-16 DIAGNOSIS — Z7901 Long term (current) use of anticoagulants: Secondary | ICD-10-CM | POA: Insufficient documentation

## 2018-05-16 DIAGNOSIS — L538 Other specified erythematous conditions: Secondary | ICD-10-CM | POA: Diagnosis not present

## 2018-05-16 DIAGNOSIS — E282 Polycystic ovarian syndrome: Secondary | ICD-10-CM | POA: Diagnosis not present

## 2018-05-16 DIAGNOSIS — L298 Other pruritus: Secondary | ICD-10-CM | POA: Diagnosis not present

## 2018-05-16 DIAGNOSIS — Z808 Family history of malignant neoplasm of other organs or systems: Secondary | ICD-10-CM | POA: Diagnosis not present

## 2018-05-16 DIAGNOSIS — D2239 Melanocytic nevi of other parts of face: Secondary | ICD-10-CM | POA: Diagnosis not present

## 2018-05-16 DIAGNOSIS — R0602 Shortness of breath: Secondary | ICD-10-CM

## 2018-05-16 LAB — CBC WITH DIFFERENTIAL (CANCER CENTER ONLY)
Basophils Absolute: 0 10*3/uL (ref 0.0–0.1)
Basophils Relative: 0 %
Eosinophils Absolute: 0.1 10*3/uL (ref 0.0–0.5)
Eosinophils Relative: 1 %
HEMATOCRIT: 40.7 % (ref 34.8–46.6)
HEMOGLOBIN: 13.2 g/dL (ref 11.6–15.9)
LYMPHS ABS: 2.5 10*3/uL (ref 0.9–3.3)
LYMPHS PCT: 32 %
MCH: 29.6 pg (ref 26.0–34.0)
MCHC: 32.4 g/dL (ref 32.0–36.0)
MCV: 91.3 fL (ref 81.0–101.0)
MONOS PCT: 8 %
Monocytes Absolute: 0.7 10*3/uL (ref 0.1–0.9)
NEUTROS ABS: 4.6 10*3/uL (ref 1.5–6.5)
NEUTROS PCT: 59 %
Platelet Count: 309 10*3/uL (ref 145–400)
RBC: 4.46 MIL/uL (ref 3.70–5.32)
RDW: 13.7 % (ref 11.1–15.7)
WBC Count: 7.9 10*3/uL (ref 3.9–10.0)

## 2018-05-16 LAB — CMP (CANCER CENTER ONLY)
ALK PHOS: 98 U/L (ref 38–126)
ALT: 20 U/L (ref 0–44)
ANION GAP: 8 (ref 5–15)
AST: 19 U/L (ref 15–41)
Albumin: 3.6 g/dL (ref 3.5–5.0)
BILIRUBIN TOTAL: 0.4 mg/dL (ref 0.3–1.2)
BUN: 12 mg/dL (ref 6–20)
CALCIUM: 8.8 mg/dL — AB (ref 8.9–10.3)
CO2: 26 mmol/L (ref 22–32)
CREATININE: 0.77 mg/dL (ref 0.44–1.00)
Chloride: 105 mmol/L (ref 98–111)
GFR, Estimated: >60 mL/min (ref >60)
Glucose, Bld: 99 mg/dL (ref 70–99)
Potassium: 4 mmol/L (ref 3.5–5.1)
Sodium: 139 mmol/L (ref 135–145)
TOTAL PROTEIN: 7 g/dL (ref 6.5–8.1)

## 2018-05-16 MED ORDER — APIXABAN 2.5 MG PO TABS: ORAL_TABLET | ORAL | 3 refills | Status: DC

## 2018-05-16 NOTE — Progress Notes (Signed)
Hematology and Oncology Follow Up Visit  Barbara Mcmahon [id number] July 16, 1976 42 y.o. 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 twice daily-complete 1 year of therapy in September 2019  Eilquis 2.5 mg po BID - start 06/08/2018     Interim History:  Barbara Mcmahon is back for follow-up.  She is doing quite well.  She is under quite a bit of stress.  This has to do with the current economic climate and the [place] that are coming out of [place].  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  I just feel bad for her.  She still traveling.  She was compression stockings when she travels.  She does have the polycystic ovaries.  She is thinking about weight reduction surgery.  I think this would be a great idea for her.  She is been on full dose Eliquis now for about a year.  I think we can cut the dose back a little bit and have her on maintenance Eliquis at 2.5 mg p.o. twice daily.  She may be on this for quite a while.  I really think that her risk factor for thromboembolic disease is the polycystic ovaries..  Overall, her performance status is ECOG 0.  Medications:  Current Outpatient Medications:  .  acetaminophen (TYLENOL) 650 MG CR tablet, Take 1 tablet (650 mg total) by mouth every 8 (eight) hours as needed for pain., Disp: 90 tablet, Rfl: 3 .  amoxicillin-clavulanate (AUGMENTIN) 875-125 MG tablet, Take 1 tablet by mouth 2 (two) times daily., Disp: , Rfl: 0 .  apixaban (ELIQUIS) 5 MG TABS tablet, 5mg  PO BID x6 months., Disp: 180 tablet, Rfl: 0 .  cetirizine (ZYRTEC) 10 MG tablet, Take 10 mg by mouth daily., Disp: , Rfl:  .  DULoxetine (CYMBALTA) 60 MG capsule, Take 1 capsule (60 mg total) by mouth daily., Disp: 90 capsule, Rfl: 3 .  EPINEPHrine (EPI-PEN) 0.3 mg/0.3 mL SOAJ injection, Inject 0.3 mLs (0.3 mg total) into the muscle once as needed (anaphylaxis)., Disp: 1 Device, Rfl: 1 .   fluconazole (DIFLUCAN) 150 MG tablet, , Disp: , Rfl:  .  ipratropium (ATROVENT) 0.06 % nasal spray, Place 2 sprays into both nostrils 4 (four) times daily., Disp: 45 mL, Rfl: 3  Allergies:  Allergies  Allergen Reactions  . Morphine And Related Other (See Comments), Hives and Rash    Big mood changes BEHAVIOR CHANGES   . Shellfish Allergy Hives, Swelling and Anaphylaxis    Throat closes  . Codeine Hives and Itching    Pt says she can take percocet  . Cinnamon   . Tramadol Itching  . Robitussin A-C [Guaifenesin-Codeine] Itching  . Yeast-Related Products Hives    Past Medical History, Surgical history, Social history, and Family History were reviewed and updated.  Review of Systems: Review of Systems  Constitutional: Negative.   HENT:  Negative.   Eyes: Negative.   Respiratory: Negative.   Cardiovascular: Negative.   Gastrointestinal: Negative.   Endocrine: Negative.   Genitourinary: Negative.    Musculoskeletal: Negative.   Skin: Negative.   Neurological: Negative.   Hematological: Negative.   Psychiatric/Behavioral: Negative.     Physical Exam:  weight is 289 lb (131.1 kg). Her oral temperature is 98.1 F (36.7 C). Her blood pressure is 141/72 (abnormal) and her pulse is 83. Her respiration is 18 and oxygen saturation is 99%.   Wt Readings from  Last 3 Encounters:  05/16/18 289 lb (131.1 kg)  05/06/18 288 lb (130.6 kg)  03/07/18 290 lb (131.5 kg)    Physical Exam  Constitutional: She is oriented to person, place, and time.  HENT:  Head: Normocephalic and atraumatic.  Mouth/Throat: Oropharynx is clear and moist.  Eyes: Pupils are equal, round, and reactive to light. EOM are normal.  Neck: Normal range of motion.  Cardiovascular: Normal rate, regular rhythm and normal heart sounds.  Pulmonary/Chest: Effort normal and breath sounds normal.  Abdominal: Soft. Bowel sounds are normal.  Musculoskeletal: Normal range of motion. She exhibits no edema, tenderness or  deformity.  Lymphadenopathy:    She has no cervical adenopathy.  Neurological: She is alert and oriented to person, place, and time.  Skin: Skin is warm and dry. No rash noted. No erythema.  Psychiatric: She has a normal mood and affect. Her behavior is normal. Judgment and thought content normal.  Vitals reviewed.    Lab Results  Component Value Date   WBC 7.9 05/16/2018   HGB 13.2 05/16/2018   HCT 40.7 05/16/2018   MCV 91.3 05/16/2018   PLT 309 05/16/2018     Chemistry      Component Value Date/Time   NA 142 11/15/2017 1011   NA 140 10/11/2017 1122   K 4.2 11/15/2017 1011   K 4.2 10/11/2017 1122   CL 106 11/15/2017 1011   CL 103 10/11/2017 1122   CO2 27 11/15/2017 1011   CO2 29 10/11/2017 1122   BUN 12 11/15/2017 1011   BUN 10 10/11/2017 1122   CREATININE 0.74 11/15/2017 1011   CREATININE 0.9 10/11/2017 1122      Component Value Date/Time   CALCIUM 9.2 11/15/2017 1011   CALCIUM 8.7 10/11/2017 1122   ALKPHOS 89 11/15/2017 1011   ALKPHOS 83 10/11/2017 1122   AST 25 11/15/2017 1011   ALT 31 11/15/2017 1011   ALT 36 10/11/2017 1122   BILITOT 0.4 11/15/2017 1011       Impression and Plan: Barbara Mcmahon is a 42 year old white female.  She has polycystic ovaries.  She had a pulmonary embolism in September 2018.  She is doing well on Eliquis.  Her recent studies did not show any residual thromboembolism.  We will go ahead and switch her over to 2.5 mg p.o. twice daily.  She will finish up her full dose Eliquis that she has first.  She would prefer to stay on low-dose Eliquis since she has the polycystic ovaries.  Hopefully, come menopause, the polycystic ovaries will not be a problem for her.  We will get her back in 6 months.     Volanda Napoleon, MD 8/9/201911:15 AM

## 2018-05-20 ENCOUNTER — Encounter: Payer: Self-pay | Admitting: Sports Medicine

## 2018-05-23 DIAGNOSIS — J301 Allergic rhinitis due to pollen: Secondary | ICD-10-CM | POA: Diagnosis not present

## 2018-05-30 DIAGNOSIS — J301 Allergic rhinitis due to pollen: Secondary | ICD-10-CM | POA: Diagnosis not present

## 2018-06-05 DIAGNOSIS — J301 Allergic rhinitis due to pollen: Secondary | ICD-10-CM | POA: Diagnosis not present

## 2018-06-13 DIAGNOSIS — J301 Allergic rhinitis due to pollen: Secondary | ICD-10-CM | POA: Diagnosis not present

## 2018-06-20 ENCOUNTER — Other Ambulatory Visit: Payer: Self-pay | Admitting: Physician Assistant

## 2018-06-20 DIAGNOSIS — R0602 Shortness of breath: Secondary | ICD-10-CM

## 2018-06-27 DIAGNOSIS — J301 Allergic rhinitis due to pollen: Secondary | ICD-10-CM | POA: Diagnosis not present

## 2018-06-27 DIAGNOSIS — J310 Chronic rhinitis: Secondary | ICD-10-CM | POA: Diagnosis not present

## 2018-07-04 DIAGNOSIS — J301 Allergic rhinitis due to pollen: Secondary | ICD-10-CM | POA: Diagnosis not present

## 2018-07-06 ENCOUNTER — Encounter: Payer: Self-pay | Admitting: Sports Medicine

## 2018-07-07 ENCOUNTER — Other Ambulatory Visit: Payer: Self-pay | Admitting: Physician Assistant

## 2018-07-07 DIAGNOSIS — R0602 Shortness of breath: Secondary | ICD-10-CM

## 2018-07-11 DIAGNOSIS — J301 Allergic rhinitis due to pollen: Secondary | ICD-10-CM | POA: Diagnosis not present

## 2018-07-18 DIAGNOSIS — J301 Allergic rhinitis due to pollen: Secondary | ICD-10-CM | POA: Diagnosis not present

## 2018-07-29 DIAGNOSIS — Z6841 Body Mass Index (BMI) 40.0 and over, adult: Secondary | ICD-10-CM | POA: Diagnosis not present

## 2018-07-29 DIAGNOSIS — Z1231 Encounter for screening mammogram for malignant neoplasm of breast: Secondary | ICD-10-CM | POA: Diagnosis not present

## 2018-07-29 DIAGNOSIS — Z01419 Encounter for gynecological examination (general) (routine) without abnormal findings: Secondary | ICD-10-CM | POA: Diagnosis not present

## 2018-07-31 ENCOUNTER — Other Ambulatory Visit: Payer: Self-pay | Admitting: Obstetrics and Gynecology

## 2018-07-31 DIAGNOSIS — R928 Other abnormal and inconclusive findings on diagnostic imaging of breast: Secondary | ICD-10-CM

## 2018-08-01 DIAGNOSIS — J301 Allergic rhinitis due to pollen: Secondary | ICD-10-CM | POA: Diagnosis not present

## 2018-08-12 ENCOUNTER — Other Ambulatory Visit: Payer: BLUE CROSS/BLUE SHIELD

## 2018-08-13 ENCOUNTER — Other Ambulatory Visit: Payer: Self-pay | Admitting: Obstetrics and Gynecology

## 2018-08-13 ENCOUNTER — Ambulatory Visit
Admission: RE | Admit: 2018-08-13 | Discharge: 2018-08-13 | Disposition: A | Discharge status: Home / Self Care | Payer: BLUE CROSS/BLUE SHIELD | Source: Ambulatory Visit | Attending: Obstetrics and Gynecology | Admitting: Obstetrics and Gynecology

## 2018-08-13 DIAGNOSIS — R928 Other abnormal and inconclusive findings on diagnostic imaging of breast: Secondary | ICD-10-CM

## 2018-08-13 DIAGNOSIS — N6310 Unspecified lump in the right breast, unspecified quadrant: Secondary | ICD-10-CM

## 2018-08-13 DIAGNOSIS — N6011 Diffuse cystic mastopathy of right breast: Secondary | ICD-10-CM | POA: Diagnosis not present

## 2018-08-13 DIAGNOSIS — R922 Inconclusive mammogram: Secondary | ICD-10-CM | POA: Diagnosis not present

## 2018-08-15 DIAGNOSIS — J0100 Acute maxillary sinusitis, unspecified: Secondary | ICD-10-CM | POA: Diagnosis not present

## 2018-08-15 DIAGNOSIS — B372 Candidiasis of skin and nail: Secondary | ICD-10-CM | POA: Diagnosis not present

## 2018-08-18 ENCOUNTER — Ambulatory Visit
Admission: RE | Admit: 2018-08-18 | Discharge: 2018-08-18 | Disposition: A | Discharge status: Home / Self Care | Payer: BLUE CROSS/BLUE SHIELD | Source: Ambulatory Visit | Attending: Obstetrics and Gynecology | Admitting: Obstetrics and Gynecology

## 2018-08-18 ENCOUNTER — Other Ambulatory Visit: Payer: Self-pay | Admitting: Obstetrics and Gynecology

## 2018-08-18 DIAGNOSIS — N6310 Unspecified lump in the right breast, unspecified quadrant: Secondary | ICD-10-CM

## 2018-08-18 DIAGNOSIS — N6011 Diffuse cystic mastopathy of right breast: Secondary | ICD-10-CM | POA: Diagnosis not present

## 2018-08-18 DIAGNOSIS — N6312 Unspecified lump in the right breast, upper inner quadrant: Secondary | ICD-10-CM | POA: Diagnosis not present

## 2018-08-22 ENCOUNTER — Encounter: Payer: Self-pay | Admitting: Physician Assistant

## 2018-08-22 ENCOUNTER — Ambulatory Visit: Payer: BLUE CROSS/BLUE SHIELD | Admitting: Physician Assistant

## 2018-08-22 VITALS — BP 130/76 | HR 84 | Temp 98.5°F | Ht 65.0 in | Wt 292.5 lb

## 2018-08-22 DIAGNOSIS — J320 Chronic maxillary sinusitis: Secondary | ICD-10-CM | POA: Diagnosis not present

## 2018-08-22 MED ORDER — METHYLPREDNISOLONE ACETATE 80 MG/ML IJ SUSP
80.0000 mg | Freq: Once | INTRAMUSCULAR | Status: AC
  Administered 2018-08-22: 80 mg via INTRAMUSCULAR

## 2018-08-22 MED ORDER — METHYLPREDNISOLONE SODIUM SUCC 125 MG IJ SOLR
125.0000 mg | Freq: Once | INTRAMUSCULAR | Status: AC
  Administered 2018-08-22: 125 mg via INTRAMUSCULAR

## 2018-08-22 MED ORDER — FLUTICASONE PROPIONATE 50 MCG/ACT NA SUSP: 2.0000 | Freq: Every day | NASAL | 6 refills | Status: DC

## 2018-08-22 NOTE — Patient Instructions (Signed)

## 2018-08-22 NOTE — Progress Notes (Signed)
Subjective:    Patient ID: Barbara Mcmahon, female    DOB: 02-Jun-1976, 42 y.o.   MRN: [id number]  HPI  Pt is a 42 yo female with hx of allergies who gets regular allergy shots and presents to the clinic with 8 weeks of sinus pressure, congestion, green rhinorrhea. She has not had allergy shots in a few weeks since she has been sick. She uses atrovent nasal spray regularly. She has had an evisit and office visit. She has had 2 abx of doxycycline and augmentin. She felt no better with doxycycline but some better while on augmentin. She has a lot of pressure in her ears. Left worse than right. She has no fever, chills, body aches. Her cough is dry. She feels drainage coming down back of throat.   .. Active Ambulatory Problems    Diagnosis Date Noted  . Obesity, morbid (more than 100 lbs over ideal weight or BMI > 40) ([place]) 06/30/2012  . Anxiety 06/30/2012  . PCOS (polycystic ovarian syndrome) 06/30/2012  . Irritable bowel syndrome with diarrhea (colonoscopy 2007) 07/04/2012  . Raynaud disease 11/19/2012  . Family history of hypertrophic cardiomyopathy 04/23/2013  . MRSA (methicillin resistant Staphylococcus aureus) 01/14/2015  . Shrimp allergy 09/24/2016  . 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 GAD (generalized anxiety disorder) 04/08/2017  . 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 Pulmonary embolism ([place]) 07/04/2017  . Chronic maxillary sinusitis 08/22/2018   Resolved Ambulatory Problems    Diagnosis Date Noted  . Acute maxillary sinusitis 06/30/2012  . Preventive measure 11/18/2013  . Cellulitis and abscess of trunk 12/01/2014  . Cellulitis 01/14/2015  . Pain of right upper extremity 08/09/2015   Past Medical History:  Diagnosis Date  . Abnormal Pap smear and cervical HPV (human papillomavirus)   . Generalized anxiety disorder   . IBS (irritable bowel syndrome)   . Seizure disorder [facility])         Review of Systems See HPI.     Objective:   Physical Exam   Constitutional: She appears well-developed and well-nourished.  HENT:  Head: Normocephalic and atraumatic.  Right Ear: External ear normal.  Left Ear: External ear normal.  TM with some mild effusions. Good light reflex.  Nasal turbinates red and swollen.  Tenderness to palpation over maxillary sinuses only.  PND present.   Eyes: Conjunctivae are normal. Right eye exhibits no discharge. Left eye exhibits no discharge.  Neck: Normal range of motion.  Cardiovascular: Normal rate and regular rhythm.  Pulmonary/Chest: Effort normal and breath sounds normal.  Lymphadenopathy:    She has no cervical adenopathy.  Neurological: She is alert.  Skin: No rash noted.  Psychiatric: She has a normal mood and affect. Her behavior is normal.          Assessment & Plan:  Marland KitchenMarland KitchenHae was seen today for cough, nasal congestion and chills.  Diagnoses and all orders for this visit:  Chronic maxillary sinusitis -     fluticasone (FLONASE) 50 MCG/ACT nasal spray; Place 2 sprays into both nostrils daily. -     methylPREDNISolone sodium succinate (SOLU-MEDROL) 125 mg/2 mL injection 125 mg -     methylPREDNISolone acetate (DEPO-MEDROL) injection 80 mg   Pt has had augmentin and doxycycline. I would like to treat with prednisone first before adding more antibiotics. Pt request not to be on oral prednisone because of mood changes. Solumedrol and depo medrol given today. Start flonase 2 sprays each nostril bid.  Give me up date on Monday. Sinusitis for  8 weeks which by definition is chronic sinusitis. Would consider treating with one more round of abx before get back in with [facility] or get CT of sinuses.

## 2018-08-24 ENCOUNTER — Encounter: Payer: Self-pay | Admitting: Physician Assistant

## 2018-08-25 ENCOUNTER — Other Ambulatory Visit: Payer: Self-pay | Admitting: Physician Assistant

## 2018-08-25 MED ORDER — LEVOFLOXACIN 500 MG PO TABS: 500.0000 mg | ORAL_TABLET | Freq: Every day | ORAL | 0 refills | Status: DC

## 2018-09-01 ENCOUNTER — Encounter: Payer: Self-pay | Admitting: Gastroenterology

## 2018-09-18 DIAGNOSIS — J301 Allergic rhinitis due to pollen: Secondary | ICD-10-CM | POA: Diagnosis not present

## 2018-09-26 ENCOUNTER — Telehealth: Payer: Self-pay | Admitting: Family Medicine

## 2018-09-26 ENCOUNTER — Ambulatory Visit: Payer: BLUE CROSS/BLUE SHIELD | Admitting: Gastroenterology

## 2018-09-26 MED ORDER — ONDANSETRON HCL 4 MG PO TABS: 4.0000 mg | ORAL_TABLET | Freq: Three times a day (TID) | ORAL | 0 refills | Status: DC | PRN

## 2018-09-26 NOTE — Telephone Encounter (Signed)
OK, rx sent.

## 2018-09-26 NOTE — Telephone Encounter (Signed)
Pt took her child to [facility] last night and she was diagnosed with a viral stomach bug. Pt is not experiencing nausea a low grade temp (99.0). Requesting zofran be sent to pharmacy on file. Routing.

## 2018-09-26 NOTE — Telephone Encounter (Signed)
Pt advised.

## 2018-09-29 ENCOUNTER — Telehealth: Payer: Self-pay

## 2018-09-29 MED ORDER — OSELTAMIVIR PHOSPHATE 75 MG PO CAPS: 75.0000 mg | ORAL_CAPSULE | Freq: Two times a day (BID) | ORAL | 0 refills | Status: DC

## 2018-09-29 NOTE — Telephone Encounter (Signed)
Barbara Mcmahon states her daughter tested positive for the Flu. She does currently have a productive cough without any other symptoms. She would like tamiflu sent in.

## 2018-09-29 NOTE — Telephone Encounter (Signed)
rx sent

## 2018-09-29 NOTE — Telephone Encounter (Signed)
Patient advised.

## 2018-10-16 ENCOUNTER — Other Ambulatory Visit: Payer: Self-pay | Admitting: Physician Assistant

## 2018-10-16 ENCOUNTER — Encounter (INDEPENDENT_AMBULATORY_CARE_PROVIDER_SITE_OTHER): Payer: Self-pay

## 2018-10-16 DIAGNOSIS — R0602 Shortness of breath: Secondary | ICD-10-CM

## 2018-10-17 DIAGNOSIS — J301 Allergic rhinitis due to pollen: Secondary | ICD-10-CM | POA: Diagnosis not present

## 2018-10-24 DIAGNOSIS — J301 Allergic rhinitis due to pollen: Secondary | ICD-10-CM | POA: Diagnosis not present

## 2018-10-27 ENCOUNTER — Ambulatory Visit: Payer: BLUE CROSS/BLUE SHIELD | Admitting: Gastroenterology

## 2018-10-30 ENCOUNTER — Encounter (INDEPENDENT_AMBULATORY_CARE_PROVIDER_SITE_OTHER): Payer: Self-pay | Admitting: Family Medicine

## 2018-10-30 ENCOUNTER — Ambulatory Visit (INDEPENDENT_AMBULATORY_CARE_PROVIDER_SITE_OTHER): Payer: BLUE CROSS/BLUE SHIELD | Admitting: Family Medicine

## 2018-10-30 VITALS — BP 130/74 | HR 79 | Ht 65.0 in | Wt 292.0 lb

## 2018-10-30 DIAGNOSIS — E7849 Other hyperlipidemia: Secondary | ICD-10-CM | POA: Diagnosis not present

## 2018-10-30 DIAGNOSIS — R0602 Shortness of breath: Secondary | ICD-10-CM | POA: Diagnosis not present

## 2018-10-30 DIAGNOSIS — Z1331 Encounter for screening for depression: Secondary | ICD-10-CM

## 2018-10-30 DIAGNOSIS — Z9189 Other specified personal risk factors, not elsewhere classified: Secondary | ICD-10-CM

## 2018-10-30 DIAGNOSIS — R739 Hyperglycemia, unspecified: Secondary | ICD-10-CM

## 2018-10-30 DIAGNOSIS — Z6841 Body Mass Index (BMI) 40.0 and over, adult: Secondary | ICD-10-CM

## 2018-10-30 DIAGNOSIS — Z0289 Encounter for other administrative examinations: Secondary | ICD-10-CM

## 2018-10-30 DIAGNOSIS — R5383 Other fatigue: Secondary | ICD-10-CM

## 2018-10-31 DIAGNOSIS — J301 Allergic rhinitis due to pollen: Secondary | ICD-10-CM | POA: Diagnosis not present

## 2018-10-31 LAB — LIPID PANEL WITH LDL/HDL RATIO
Cholesterol, Total: 182 mg/dL (ref 100–199)
HDL: 56 mg/dL (ref >39)
LDL Calculated: 106 mg/dL — ABNORMAL HIGH (ref 0–99)
LDL/HDL RATIO: 1.9 ratio (ref 0.0–3.2)
TRIGLYCERIDES: 98 mg/dL (ref 0–149)
VLDL CHOLESTEROL CAL: 20 mg/dL (ref 5–40)

## 2018-10-31 LAB — CBC WITH DIFFERENTIAL
BASOS: 1 %
Basophils Absolute: 0.1 10*3/uL (ref 0.0–0.2)
EOS (ABSOLUTE): 0.1 10*3/uL (ref 0.0–0.4)
EOS: 1 %
Hematocrit: 38 % (ref 34.0–46.6)
Hemoglobin: 12.6 g/dL (ref 11.1–15.9)
Immature Grans (Abs): 0 10*3/uL (ref 0.0–0.1)
Immature Granulocytes: 0 %
LYMPHS ABS: 2.7 10*3/uL (ref 0.7–3.1)
Lymphs: 33 %
MCH: 29.2 pg (ref 26.6–33.0)
MCHC: 33.2 g/dL (ref 31.5–35.7)
MCV: 88 fL (ref 79–97)
MONOS ABS: 0.6 10*3/uL (ref 0.1–0.9)
Monocytes: 7 %
Neutrophils Absolute: 4.6 10*3/uL (ref 1.4–7.0)
Neutrophils: 58 %
RBC: 4.32 x10E6/uL (ref 3.77–5.28)
RDW: 13.2 % (ref 11.7–15.4)
WBC: 8.1 10*3/uL (ref 3.4–10.8)

## 2018-10-31 LAB — HEMOGLOBIN A1C
Est. average glucose Bld gHb Est-mCnc: 120 mg/dL
Hgb A1c MFr Bld: 5.8 % — ABNORMAL HIGH (ref 4.8–5.6)

## 2018-10-31 LAB — COMPREHENSIVE METABOLIC PANEL
ALT: 19 IU/L (ref 0–32)
AST: 16 IU/L (ref 0–40)
Albumin/Globulin Ratio: 1.6 (ref 1.2–2.2)
Albumin: 4.1 g/dL (ref 3.8–4.8)
Alkaline Phosphatase: 106 IU/L (ref 39–117)
BUN/Creatinine Ratio: 15 (ref 9–23)
BUN: 10 mg/dL (ref 6–24)
Bilirubin Total: 0.4 mg/dL (ref 0.0–1.2)
CO2: 22 mmol/L (ref 20–29)
CREATININE: 0.66 mg/dL (ref 0.57–1.00)
Calcium: 9.4 mg/dL (ref 8.7–10.2)
Chloride: 102 mmol/L (ref 96–106)
GFR, EST AFRICAN AMERICAN: 125 mL/min/{1.73_m2} (ref >59)
GFR, EST NON AFRICAN AMERICAN: 109 mL/min/{1.73_m2} (ref >59)
GLUCOSE: 99 mg/dL (ref 65–99)
Globulin, Total: 2.6 g/dL (ref 1.5–4.5)
Potassium: 4.5 mmol/L (ref 3.5–5.2)
Sodium: 140 mmol/L (ref 134–144)
TOTAL PROTEIN: 6.7 g/dL (ref 6.0–8.5)

## 2018-10-31 LAB — INSULIN, RANDOM: INSULIN: 10.3 u[IU]/mL (ref 2.6–24.9)

## 2018-10-31 LAB — VITAMIN B12: Vitamin B-12: 492 pg/mL (ref 232–1245)

## 2018-10-31 LAB — T4, FREE: FREE T4: 1.08 ng/dL (ref 0.82–1.77)

## 2018-10-31 LAB — T3: T3, Total: 134 ng/dL (ref 71–180)

## 2018-10-31 LAB — FOLATE: Folate: 8.1 ng/mL (ref >3.0)

## 2018-10-31 LAB — VITAMIN D 25 HYDROXY (VIT D DEFICIENCY, FRACTURES): VIT D 25 HYDROXY: 19.6 ng/mL — AB (ref 30.0–100.0)

## 2018-10-31 LAB — TSH: TSH: 2.28 u[IU]/mL (ref 0.450–4.500)

## 2018-11-01 ENCOUNTER — Encounter (INDEPENDENT_AMBULATORY_CARE_PROVIDER_SITE_OTHER): Payer: Self-pay | Admitting: Family Medicine

## 2018-11-03 NOTE — Progress Notes (Signed)
Office: [phone]  /  Fax: [phone]   Dear Dr. Rene Kocher. Mcmahon,  Thank you for referring Barbara Mcmahon to our clinic. The following note includes my evaluation and treatment recommendations.  HPI:   Chief Complaint: OBESITY    Barbara Mcmahon has been referred by Dr. Rene Kocher. Mcmahon for consultation regarding her obesity and obesity related comorbidities.    Barbara Mcmahon (MR# [id number]) is a 43 y.o. female who presents on 10/30/2018 for obesity evaluation and treatment. Current BMI is Body mass index is 48.59 kg/m.  Barbara Mcmahon has been struggling with her weight for many years and has been unsuccessful in either losing weight, maintaining weight loss, or reaching her healthy weight goal.     Barbara Mcmahon attended our information session and states she is currently in the action stage of change and ready to dedicate time achieving and maintaining a healthier weight. Barbara Mcmahon is interested in becoming our patient and working on intensive lifestyle modifications including (but not limited to) diet, exercise and weight loss.    Barbara Mcmahon states her family eats meals together she thinks her family will eat healthier with her her desired weight loss is 107 lbs she has been heavy most of her life she started gaining weight in college her heaviest weight ever was 293 lbs. she has significant food cravings issues  she snacks frequently in the evenings she wakes up frequently in the middle of the night to eat she is frequently drinking liquids with calories she frequently makes poor food choices she has problems with excessive hunger  she has binge eating behaviors she struggles with emotional eating    Barbara Mcmahon feels her energy is lower than it should be. This has worsened with weight gain and has not worsened recently. Barbara Mcmahon admits to daytime somnolence and admits to waking up still tired. Patient is at risk for obstructive sleep apnea. Patent has a  history of symptoms of daytime Barbara. Patient generally gets 6 or 7 hours of sleep per night, and states they generally have restless sleep. Snoring is present. Apneic episodes is not present. Epworth Sleepiness Score is 4.  Dyspnea on exertion Barbara Mcmahon notes increasing shortness of breath with exercising and seems to be worsening over time with weight gain. She notes getting out of breath sooner with activity than she used to. This has not gotten worse recently. Barbara Mcmahon denies orthopnea.  Hyperglycemia Barbara Mcmahon has a history of some elevated blood glucose readings without a diagnosis of diabetes. She has a history of polycystic ovarian syndrome.  At risk for diabetes Barbara Mcmahon is at higher than average risk for developing diabetes due to her hyperglycemia and obesity. She currently denies polyuria or polydipsia.  Hyperlipidemia Barbara Mcmahon has hyperlipidemia and has been attempting to improve her cholesterol levels with diet and intensive lifestyle modification including a low saturated fat diet, exercise and weight loss. She denies any chest pain.  Depression Screen Barbara Mcmahon's Food and Mood (modified PHQ-9) score was 13. Depression screen [facility] 2/9 10/30/2018  Decreased Interest 2  Down, Depressed, Hopeless 1  PHQ - 2 Score 3  Altered sleeping 1  Tired, decreased energy 3  Change in appetite 3  Feeling bad or failure about yourself  2  Trouble concentrating 0  Moving slowly or fidgety/restless 1  Suicidal thoughts 0  PHQ-9 Score 13  Difficult doing work/chores Not difficult at all   ASSESSMENT AND PLAN:  Other Barbara - Plan: EKG 12-Lead, CBC With Differential, VITAMIN D 25 Hydroxy (Vit-D Deficiency, Fractures), Vitamin B12, Folate, T3,  T4, free, TSH  SOB (shortness of breath) on exertion - Plan: CBC With Differential, VITAMIN D 25 Hydroxy (Vit-D Deficiency, Fractures), Vitamin B12, Folate, T3, T4, free, TSH  Hyperglycemia - Plan: Hemoglobin A1c, Insulin, random  Other  hyperlipidemia - Plan: Comprehensive metabolic panel, Lipid Panel With LDL/HDL Ratio  Depression screening  At risk for diabetes mellitus  Class 3 severe obesity with serious comorbidity and body mass index (BMI) of 45.0 to 49.9 in adult, unspecified obesity type (HCC)  PLAN:  Barbara Barbara Mcmahon was informed that her Barbara may be related to obesity, depression or many other causes. Labs will be ordered, and in the meanwhile [name] has agreed to work on diet, exercise and weight loss to help with Barbara. Proper sleep hygiene was discussed including the need for 7-8 hours of quality sleep each night. A sleep study was not ordered based on symptoms and Epworth score. An EKG and an indirect calorimetry was ordered today and Jozlynn agrees to follow up in 2 weeks.  Dyspnea on exertion Barbara Mcmahon's shortness of breath appears to be obesity related and exercise induced. She has agreed to work on weight loss and gradually increase exercise to treat her exercise induced shortness of breath. If [name] follows our instructions and loses weight without improvement of her shortness of breath, we will plan to refer to pulmonology. We will monitor this condition regularly. Labs, an indirect calorimetry, and an EKG was ordered today. [name] agrees to this plan.  Hyperglycemia Fasting labs will be obtained and results with be discussed with Barbara Mcmahon in 2 weeks at her follow up visit. In the meanwhile, Barbara Mcmahon was started on a lower simple carbohydrate diet and will work on weight loss efforts.  Diabetes risk counseling Barbara Mcmahon was given extended (15 minutes) diabetes prevention counseling today. She is 43 y.o. female and has risk factors for diabetes including hyperglycemia and obesity. We discussed intensive lifestyle modifications today with an emphasis on weight loss as well as increasing exercise and decreasing simple carbohydrates in her diet.  Hyperlipidemia Barbara Mcmahon was informed of the  American Heart Association Guidelines emphasizing intensive lifestyle modifications as the first line treatment for hyperlipidemia. We discussed many lifestyle modifications today in depth, and Barbara Mcmahon will continue to work on decreasing saturated fats such as fatty red meat, butter and many fried foods. She will also increase vegetables and lean protein in her diet and continue to work on exercise and weight loss efforts. We ordered labs today. She agrees to start her diet and follow up as directed.  Depression Screen Barbara Mcmahon had a moderately positive depression screening. Depression is commonly associated with obesity and often results in emotional eating behaviors. We will monitor this closely and work on CBT to help improve the non-hunger eating patterns. Referral to Psychology may be required if no improvement is seen as she continues in our clinic.  Obesity Barbara Mcmahon is currently in the action stage of change and her goal is to continue with weight loss efforts. I recommend Barbara Mcmahon begin the structured treatment plan as follows:  She has agreed to follow the Category 4 plan. Barbara Mcmahon has been instructed to eventually work up to a goal of 150 minutes of combined cardio and strengthening exercise per week for weight loss and overall health benefits. We discussed the following Behavioral Modification Strategies today: increasing lean protein intake, decreasing simple carbohydrates, and work on meal planning and easy cooking plans.   She was informed of the importance of frequent follow up visits to maximize her success with  intensive lifestyle modifications for her multiple health conditions. She was informed we would discuss her lab results at her next visit unless there is a critical issue that needs to be addressed sooner. Barbara Mcmahon agreed to keep her next visit at the agreed upon time to discuss these results.  ALLERGIES: Allergies  Allergen Reactions  . Morphine And Related Other  (See Comments), Hives and Rash    Big mood changes BEHAVIOR CHANGES   . Shellfish Allergy Hives, Swelling and Anaphylaxis    Throat closes  . Codeine Hives and Itching    Pt says she can take percocet  . Cinnamon   . Robitussin A-C [Guaifenesin-Codeine] Itching  . Tramadol Itching  . Yeast-Related Products Hives    MEDICATIONS: Current Outpatient Medications on File Prior to Visit  Medication Sig Dispense Refill  . acetaminophen (TYLENOL) 650 MG CR tablet Take 1 tablet (650 mg total) by mouth every 8 (eight) hours as needed for pain. 90 tablet 3  . cetirizine (ZYRTEC) 10 MG tablet Take 10 mg by mouth daily.    . DULoxetine (CYMBALTA) 60 MG capsule Take 1 capsule (60 mg total) by mouth daily. 90 capsule 3  . ELIQUIS 5 MG TABS tablet TAKE 1 TABLET BY MOUTH TWICE A DAY 180 tablet 3  . EPINEPHrine (EPI-PEN) 0.3 mg/0.3 mL SOAJ injection Inject 0.3 mLs (0.3 mg total) into the muscle once as needed (anaphylaxis). 1 Device 1  . Melatonin 1 MG CAPS Take 1 capsule by mouth at bedtime.     No current facility-administered medications on file prior to visit.     PAST MEDICAL HISTORY: Past Medical History:  Diagnosis Date  . Abnormal Pap smear and cervical HPV (human papillomavirus)   . Anxiety   . Arthritis    BAck and sacral joint  . Back pain   . Constipation   . Gallbladder problem   . Generalized anxiety disorder   . IBS (irritable bowel syndrome)   . Infertility, female   . Joint pain   . Multiple food allergies    Shellfish, cinnamon and yeast  . Obesity   . PCOS (polycystic ovarian syndrome)   . Prediabetes   . Pulmonary embolism ([place])   . Seizure disorder ([place])   . Vitamin D deficiency     PAST SURGICAL HISTORY: Past Surgical History:  Procedure Laterality Date  . CHOLECYSTECTOMY    . COLPOSCOPY    . DILATION AND CURETTAGE OF UTERUS    . GYNECOLOGIC CRYOSURGERY    . KNEE SURGERY  right    SOCIAL HISTORY: Social History   Tobacco Use  . Smoking status:  Never Smoker  . Smokeless tobacco: Never Used  Substance Use Topics  . Alcohol use: No  . Drug use: No    FAMILY HISTORY: Family History  Problem Relation Age of Onset  . Hypertension Father   . Depression Father   . Anxiety disorder Father   . Sleep apnea Father   . Obesity Father   . Diabetes Paternal Grandmother   . Hypertension Paternal Grandmother   . Stroke Paternal Grandmother   . Diabetes Paternal Grandfather   . Hypertension Paternal Grandfather   . Stroke Paternal Grandfather   . Hypertension Mother   . Depression Mother   . Anxiety disorder Mother   . Eating disorder Mother    ROS: Review of Systems  Constitutional: Positive for malaise/Barbara. Negative for weight loss.  Eyes:       Wears glasses or contacts.  Respiratory: Positive  for shortness of breath.   Cardiovascular: Negative for chest pain and orthopnea.  Gastrointestinal: Positive for constipation and diarrhea.  Genitourinary:       Negative for polyuria.  Musculoskeletal: Positive for back pain, joint pain and myalgias.       Positive for muscle stiffness.  Endo/Heme/Allergies: Negative for polydipsia.       Positive for hyperglycemia.  Psychiatric/Behavioral:       Positive for stress. Positive for nervousness.    PHYSICAL EXAM: Blood pressure 130/74, pulse 79, height 5\' 5"  (1.651 m), weight 292 lb (132.5 kg), SpO2 100 %. Body mass index is 48.59 kg/m. Physical Exam Vitals signs reviewed.  Constitutional:      Appearance: Normal appearance. She is obese.  HENT:     Head: Normocephalic and atraumatic.     Nose: Nose normal.  Eyes:     General: No scleral icterus.    Extraocular Movements: Extraocular movements intact.  Neck:     Musculoskeletal: Normal range of motion and neck supple.     Comments: Negative for thyromegaly. Cardiovascular:     Rate and Rhythm: Normal rate and regular rhythm.  Pulmonary:     Effort: Pulmonary effort is normal. No respiratory distress.  Abdominal:       Palpations: Abdomen is soft.     Tenderness: There is no abdominal tenderness.     Comments: Positive for obesity.  Musculoskeletal:     Comments: ROM normal in all extremities.  Skin:    General: Skin is warm and dry.  Neurological:     Mental Status: She is alert and oriented to person, place, and time.     Coordination: Coordination normal.  Psychiatric:        Mood and Affect: Mood normal.        Behavior: Behavior normal.     RECENT LABS AND TESTS: BMET    Component Value Date/Time   NA 140 10/30/2018 0953   NA 140 10/11/2017 1122   K 4.5 10/30/2018 0953   K 4.2 10/11/2017 1122   CL 102 10/30/2018 0953   CL 103 10/11/2017 1122   CO2 22 10/30/2018 0953   CO2 29 10/11/2017 1122   GLUCOSE 99 10/30/2018 0953   GLUCOSE 99 05/16/2018 0948   GLUCOSE 95 10/11/2017 1122   BUN 10 10/30/2018 0953   BUN 10 10/11/2017 1122   CREATININE 0.66 10/30/2018 0953   CREATININE 0.77 05/16/2018 0948   CREATININE 0.9 10/11/2017 1122   CALCIUM 9.4 10/30/2018 0953   CALCIUM 8.7 10/11/2017 1122   GFRNONAA 109 10/30/2018 0953   GFRNONAA >60 05/16/2018 0948   GFRNONAA >89 10/16/2012 1646   GFRAA 125 10/30/2018 0953   GFRAA >60 05/16/2018 0948   GFRAA >89 10/16/2012 1646   Lab Results  Component Value Date   HGBA1C 5.8 (H) 10/30/2018   Lab Results  Component Value Date   INSULIN 10.3 10/30/2018   CBC    Component Value Date/Time   WBC 8.1 10/30/2018 0953   WBC 7.9 05/16/2018 0948   WBC 12.2 (H) 12/04/2017 1103   RBC 4.32 10/30/2018 0953   RBC 4.46 05/16/2018 0948   HGB 12.6 10/30/2018 0953   HGB 13.4 10/11/2017 1122   HCT 38.0 10/30/2018 0953   HCT 40.9 10/11/2017 1122   PLT 309 05/16/2018 0948   PLT 291 10/11/2017 1122   MCV 88 10/30/2018 0953   MCV 92 10/11/2017 1122   MCH 29.2 10/30/2018 0953   MCH 29.6 05/16/2018 0948  MCHC 33.2 10/30/2018 0953   MCHC 32.4 05/16/2018 0948   RDW 13.2 10/30/2018 0953   RDW 13.3 10/11/2017 1122   LYMPHSABS 2.7 10/30/2018 0953    LYMPHSABS 2.8 10/11/2017 1122   MONOABS 0.7 05/16/2018 0948   EOSABS 0.1 10/30/2018 0953   EOSABS 0.0 10/11/2017 1122   BASOSABS 0.1 10/30/2018 0953   BASOSABS 0.0 10/11/2017 1122   Iron/TIBC/Ferritin/ %Sat No results found for: IRON, TIBC, FERRITIN, IRONPCTSAT Lipid Panel     Component Value Date/Time   CHOL 182 10/30/2018 0953   TRIG 98 10/30/2018 0953   HDL 56 10/30/2018 0953   LDLCALC 106 (H) 10/30/2018 0953   Hepatic Function Panel     Component Value Date/Time   PROT 6.7 10/30/2018 0953   PROT 6.8 10/11/2017 1122   ALBUMIN 4.1 10/30/2018 0953   AST 16 10/30/2018 0953   AST 19 05/16/2018 0948   ALT 19 10/30/2018 0953   ALT 20 05/16/2018 0948   ALT 36 10/11/2017 1122   ALKPHOS 106 10/30/2018 0953   ALKPHOS 83 10/11/2017 1122   BILITOT 0.4 10/30/2018 0953   BILITOT 0.4 05/16/2018 0948      Component Value Date/Time   TSH 2.280 10/30/2018 0953   TSH 1.351 01/13/2013 1446   ECG  shows NSR with a rate of 79 BPM. INDIRECT CALORIMETER done today shows a VO2 of 379 and a REE of 2640. Her calculated basal metabolic rate is 4818 thus her basal metabolic rate is better than expected.  OBESITY BEHAVIORAL INTERVENTION VISIT  Today's visit was # 1   Starting weight: 292 lbs Starting date: 10/30/18 Today's weight : Weight: 292 lb (132.5 kg)  Today's date: 10/30/2018 Total lbs lost to date: 0  ASK: We discussed the diagnosis of obesity with Elijio Miles today and [place] agreed to give [name] permission to discuss obesity behavioral modification therapy today.  ASSESS: Brettney has the diagnosis of obesity and her BMI today is 48.5. Acelyn is in the action stage of change.   ADVISE: Corissa was educated on the multiple health risks of obesity as well as the benefit of weight loss to improve her health. She was advised of the need for long term treatment and the importance of lifestyle modifications to improve her current health and to decrease her risk of  future health problems.  AGREE: Multiple dietary modification options and treatment options were discussed and Desarai agreed to follow the recommendations documented in the above note.  ARRANGE: Farron was educated on the importance of frequent visits to treat obesity as outlined per CMS and USPSTF guidelines and agreed to schedule her next follow up appointment today.  I, Marcille Blanco, am acting as transcriptionist for Starlyn Skeans, MD   I have reviewed the above documentation for accuracy and completeness, and I agree with the above. -Dennard Nip, MD

## 2018-11-07 DIAGNOSIS — J301 Allergic rhinitis due to pollen: Secondary | ICD-10-CM | POA: Diagnosis not present

## 2018-11-10 ENCOUNTER — Encounter (INDEPENDENT_AMBULATORY_CARE_PROVIDER_SITE_OTHER): Payer: Self-pay | Admitting: Family Medicine

## 2018-11-10 ENCOUNTER — Ambulatory Visit (INDEPENDENT_AMBULATORY_CARE_PROVIDER_SITE_OTHER): Payer: BLUE CROSS/BLUE SHIELD | Admitting: Family Medicine

## 2018-11-10 VITALS — BP 115/64 | HR 94 | Temp 97.8°F | Ht 65.0 in | Wt 288.0 lb

## 2018-11-10 DIAGNOSIS — Z9189 Other specified personal risk factors, not elsewhere classified: Secondary | ICD-10-CM | POA: Diagnosis not present

## 2018-11-10 DIAGNOSIS — Z6841 Body Mass Index (BMI) 40.0 and over, adult: Secondary | ICD-10-CM

## 2018-11-10 DIAGNOSIS — R7303 Prediabetes: Secondary | ICD-10-CM

## 2018-11-10 DIAGNOSIS — E559 Vitamin D deficiency, unspecified: Secondary | ICD-10-CM

## 2018-11-10 DIAGNOSIS — J301 Allergic rhinitis due to pollen: Secondary | ICD-10-CM | POA: Diagnosis not present

## 2018-11-10 DIAGNOSIS — J329 Chronic sinusitis, unspecified: Secondary | ICD-10-CM | POA: Diagnosis not present

## 2018-11-10 MED ORDER — VITAMIN D (ERGOCALCIFEROL) 1.25 MG (50000 UNIT) PO CAPS: 50000.0000 [IU] | ORAL_CAPSULE | ORAL | 0 refills | Status: DC

## 2018-11-11 NOTE — Progress Notes (Signed)
Office: [phone]  /  Fax: [phone]   HPI:   Chief Complaint: OBESITY Barbara Mcmahon is here to discuss her progress with her obesity treatment plan. She is on the Category 4 plan and is following her eating plan approximately 75 % of the time. She states she is exercising 0 minutes 0 times per week. Barbara Mcmahon has done well on her eating plan even with increased traveling in [place]. She did well with portion control and smarter choices during that time and got good support at home.  Her weight is 288 lb (130.6 kg) today and has had a weight loss of 4 pounds over a period of 2 weeks since her last visit. She has lost 4 lbs since starting treatment with [name].  Vitamin D deficiency Nature has a new diagnosis of vitamin D deficiency. She is not currently taking vit D and her level is very low. She admits fatigue.   Pre-Diabetes Barbara Mcmahon has a diagnosis of pre-diabetes based on her elevated Hgb A1c and was informed this puts her at greater risk of developing diabetes. She has a history of polycystic ovarian syndrome and her A1c is now elevated. She is not taking metformin currently and continues to work on diet and exercise to decrease risk of diabetes. She admits polyphagia..  At risk for diabetes Barbara Mcmahon is at higher than average risk for developing diabetes due to her pre-diabetes and obesity. She currently denies polyuria or polydipsia.  ASSESSMENT AND PLAN:  Vitamin D deficiency - Plan: Vitamin D, Ergocalciferol, (DRISDOL) 1.25 MG (50000 UT) CAPS capsule  Prediabetes  At risk for diabetes mellitus  Class 3 severe obesity with serious comorbidity and body mass index (BMI) of 45.0 to 49.9 in adult, unspecified obesity type ([place])  PLAN:  Vitamin D Deficiency Barbara Mcmahon was informed that low vitamin D levels contributes to fatigue and are associated with obesity, breast, and colon cancer. Barbara Mcmahon agrees to start to take prescription Vit D @50 ,000 IU every week #4 with no  refills and will follow up for routine testing of vitamin D, at least 2-3 times per year. She was informed of the risk of over-replacement of vitamin D and agrees to not increase her dose unless she discusses this with [name] first. We will recheck labs in 3 months and Barbara Mcmahon agrees to follow up in 2 weeks.  Pre-Diabetes Barbara Mcmahon will continue to work on weight loss, exercise, and decreasing simple carbohydrates in her diet to help decrease the risk of diabetes. We discussed metformin including benefits and risks. She was informed that eating too many simple carbohydrates or too many calories at one sitting increases the likelihood of GI side effects. Barbara Mcmahon deferred metformin for now and a agreed to continue with her diet and exercise. We will recheck labs in 3 months. Barbara Mcmahon agreed to follow up with [name] as directed to monitor her progress.  Diabetes risk counseling Barbara Mcmahon was given extended (30 minutes) diabetes prevention counseling today. She is 43 y.o. female and has risk factors for diabetes including pre-diabetes and obesity. We discussed intensive lifestyle modifications today with an emphasis on weight loss as well as increasing exercise and decreasing simple carbohydrates in her diet.  Obesity Barbara Mcmahon is currently in the action stage of change. As such, her goal is to continue with weight loss efforts. She has agreed to follow the Category 4 plan. Barbara Mcmahon has been instructed to work up to a goal of 150 minutes of combined cardio and strengthening exercise per week for weight loss and overall  health benefits. We discussed the following Behavioral Modification Strategies today: increasing lean protein intake, decreasing simple carbohydrates, and work on meal planning and easy cooking plans.  Barbara Mcmahon has agreed to follow up with our clinic in 2 weeks. She was informed of the importance of frequent follow up visits to maximize her success with intensive lifestyle modifications for  her multiple health conditions.  ALLERGIES: Allergies  Allergen Reactions  . Morphine And Related Other (See Comments), Hives and Rash    Big mood changes BEHAVIOR CHANGES   . Shellfish Allergy Hives, Swelling and Anaphylaxis    Throat closes  . Codeine Hives and Itching    Pt says she can take percocet  . Cinnamon   . Robitussin A-C [Guaifenesin-Codeine] Itching  . Tramadol Itching  . Yeast-Related Products Hives    MEDICATIONS: Current Outpatient Medications on File Prior to Visit  Medication Sig Dispense Refill  . acetaminophen (TYLENOL) 650 MG CR tablet Take 1 tablet (650 mg total) by mouth every 8 (eight) hours as needed for pain. 90 tablet 3  . cetirizine (ZYRTEC) 10 MG tablet Take 10 mg by mouth daily.    . DULoxetine (CYMBALTA) 60 MG capsule Take 1 capsule (60 mg total) by mouth daily. 90 capsule 3  . ELIQUIS 5 MG TABS tablet TAKE 1 TABLET BY MOUTH TWICE A DAY 180 tablet 3  . EPINEPHrine (EPI-PEN) 0.3 mg/0.3 mL SOAJ injection Inject 0.3 mLs (0.3 mg total) into the muscle once as needed (anaphylaxis). 1 Device 1  . Melatonin 1 MG CAPS Take 1 capsule by mouth at bedtime.     No current facility-administered medications on file prior to visit.     PAST MEDICAL HISTORY: Past Medical History:  Diagnosis Date  . Abnormal Pap smear and cervical HPV (human papillomavirus)   . Anxiety   . Arthritis    BAck and sacral joint  . Back pain   . Constipation   . Gallbladder problem   . Generalized anxiety disorder   . IBS (irritable bowel syndrome)   . Infertility, female   . Joint pain   . Multiple food allergies    Shellfish, cinnamon and yeast  . Obesity   . PCOS (polycystic ovarian syndrome)   . Prediabetes   . Pulmonary embolism ([place])   . Seizure disorder ([place])   . Vitamin D deficiency     PAST SURGICAL HISTORY: Past Surgical History:  Procedure Laterality Date  . CHOLECYSTECTOMY    . COLPOSCOPY    . DILATION AND CURETTAGE OF UTERUS    . GYNECOLOGIC  CRYOSURGERY    . KNEE SURGERY  right    SOCIAL HISTORY: Social History   Tobacco Use  . Smoking status: Never Smoker  . Smokeless tobacco: Never Used  Substance Use Topics  . Alcohol use: No  . Drug use: No    FAMILY HISTORY: Family History  Problem Relation Age of Onset  . Hypertension Father   . Depression Father   . Anxiety disorder Father   . Sleep apnea Father   . Obesity Father   . Diabetes Paternal Grandmother   . Hypertension Paternal Grandmother   . Stroke Paternal Grandmother   . Diabetes Paternal Grandfather   . Hypertension Paternal Grandfather   . Stroke Paternal Grandfather   . Hypertension Mother   . Depression Mother   . Anxiety disorder Mother   . Eating disorder Mother     ROS: Review of Systems  Constitutional: Positive for malaise/fatigue and weight loss.  Genitourinary:       Negative for polyuria.  Endo/Heme/Allergies: Negative for polydipsia.       Positive for polyphagia.   PHYSICAL EXAM: Blood pressure 115/64, pulse 94, temperature 97.8 F (36.6 C), temperature source Oral, height 5\' 5"  (1.651 m), weight 288 lb (130.6 kg), SpO2 95 %. Body mass index is 47.93 kg/m. Physical Exam Vitals signs reviewed.  Constitutional:      Appearance: Normal appearance. She is obese.  Cardiovascular:     Rate and Rhythm: Normal rate.  Pulmonary:     Effort: Pulmonary effort is normal.  Musculoskeletal: Normal range of motion.  Skin:    General: Skin is warm and dry.  Neurological:     Mental Status: She is alert and oriented to person, place, and time.  Psychiatric:        Mood and Affect: Mood normal.        Behavior: Behavior normal.    RECENT LABS AND TESTS: BMET    Component Value Date/Time   NA 140 10/30/2018 0953   NA 140 10/11/2017 1122   K 4.5 10/30/2018 0953   K 4.2 10/11/2017 1122   CL 102 10/30/2018 0953   CL 103 10/11/2017 1122   CO2 22 10/30/2018 0953   CO2 29 10/11/2017 1122   GLUCOSE 99 10/30/2018 0953   GLUCOSE 99  05/16/2018 0948   GLUCOSE 95 10/11/2017 1122   BUN 10 10/30/2018 0953   BUN 10 10/11/2017 1122   CREATININE 0.66 10/30/2018 0953   CREATININE 0.77 05/16/2018 0948   CREATININE 0.9 10/11/2017 1122   CALCIUM 9.4 10/30/2018 0953   CALCIUM 8.7 10/11/2017 1122   GFRNONAA 109 10/30/2018 0953   GFRNONAA >60 05/16/2018 0948   GFRNONAA >89 10/16/2012 1646   GFRAA 125 10/30/2018 0953   GFRAA >60 05/16/2018 0948   GFRAA >89 10/16/2012 1646   Lab Results  Component Value Date   HGBA1C 5.8 (H) 10/30/2018   Lab Results  Component Value Date   INSULIN 10.3 10/30/2018   CBC    Component Value Date/Time   WBC 8.1 10/30/2018 0953   WBC 7.9 05/16/2018 0948   WBC 12.2 (H) 12/04/2017 1103   RBC 4.32 10/30/2018 0953   RBC 4.46 05/16/2018 0948   HGB 12.6 10/30/2018 0953   HGB 13.4 10/11/2017 1122   HCT 38.0 10/30/2018 0953   HCT 40.9 10/11/2017 1122   PLT 309 05/16/2018 0948   PLT 291 10/11/2017 1122   MCV 88 10/30/2018 0953   MCV 92 10/11/2017 1122   MCH 29.2 10/30/2018 0953   MCH 29.6 05/16/2018 0948   MCHC 33.2 10/30/2018 0953   MCHC 32.4 05/16/2018 0948   RDW 13.2 10/30/2018 0953   RDW 13.3 10/11/2017 1122   LYMPHSABS 2.7 10/30/2018 0953   LYMPHSABS 2.8 10/11/2017 1122   MONOABS 0.7 05/16/2018 0948   EOSABS 0.1 10/30/2018 0953   EOSABS 0.0 10/11/2017 1122   BASOSABS 0.1 10/30/2018 0953   BASOSABS 0.0 10/11/2017 1122   Iron/TIBC/Ferritin/ %Sat No results found for: IRON, TIBC, FERRITIN, IRONPCTSAT Lipid Panel     Component Value Date/Time   CHOL 182 10/30/2018 0953   TRIG 98 10/30/2018 0953   HDL 56 10/30/2018 0953   LDLCALC 106 (H) 10/30/2018 0953   Hepatic Function Panel     Component Value Date/Time   PROT 6.7 10/30/2018 0953   PROT 6.8 10/11/2017 1122   ALBUMIN 4.1 10/30/2018 0953   AST 16 10/30/2018 0953   AST 19 05/16/2018 0948  ALT 19 10/30/2018 0953   ALT 20 05/16/2018 0948   ALT 36 10/11/2017 1122   ALKPHOS 106 10/30/2018 0953   ALKPHOS 83 10/11/2017  1122   BILITOT 0.4 10/30/2018 0953   BILITOT 0.4 05/16/2018 0948      Component Value Date/Time   TSH 2.280 10/30/2018 0953   TSH 1.351 01/13/2013 1446   Results for DE, LIBMAN (MRN [id number]) as of 11/11/2018 10:21  Ref. Range 10/30/2018 09:53  Vitamin D, 25-Hydroxy Latest Ref Range: 30.0 - 100.0 ng/mL 19.6 (L)    OBESITY BEHAVIORAL INTERVENTION VISIT  Today's visit was # 2   Starting weight: 292 lbs Starting date: 10/30/2018 Today's weight : Weight: 288 lb (130.6 kg)  Today's date: 11/10/2018 Total lbs lost to date: 4  ASK: We discussed the diagnosis of obesity with Barbara Mcmahon today and Barbara Mcmahon agreed to give [name] permission to discuss obesity behavioral modification therapy today.  ASSESS: Barbara Mcmahon has the diagnosis of obesity and her BMI today is 47.9. Harshitha is in the action stage of change.   ADVISE: Fidela was educated on the multiple health risks of obesity as well as the benefit of weight loss to improve her health. She was advised of the need for long term treatment and the importance of lifestyle modifications to improve her current health and to decrease her risk of future health problems.  AGREE: Multiple dietary modification options and treatment options were discussed and Barbara Mcmahon agreed to follow the recommendations documented in the above note.  ARRANGE: Barbara Mcmahon was educated on the importance of frequent visits to treat obesity as outlined per CMS and USPSTF guidelines and agreed to schedule her next follow up appointment today.  IMarcille Blanco, CMA, am acting as transcriptionist for Starlyn Skeans, MD  I have reviewed the above documentation for accuracy and completeness, and I agree with the above. -Dennard Nip, MD

## 2018-11-13 ENCOUNTER — Ambulatory Visit (INDEPENDENT_AMBULATORY_CARE_PROVIDER_SITE_OTHER): Payer: Self-pay | Admitting: Family Medicine

## 2018-11-14 ENCOUNTER — Inpatient Hospital Stay: Payer: BLUE CROSS/BLUE SHIELD | Attending: Hematology & Oncology

## 2018-11-14 ENCOUNTER — Other Ambulatory Visit: Payer: Self-pay

## 2018-11-14 ENCOUNTER — Encounter: Payer: Self-pay | Admitting: Hematology & Oncology

## 2018-11-14 ENCOUNTER — Inpatient Hospital Stay: Payer: BLUE CROSS/BLUE SHIELD | Admitting: Hematology & Oncology

## 2018-11-14 DIAGNOSIS — Z7901 Long term (current) use of anticoagulants: Secondary | ICD-10-CM | POA: Insufficient documentation

## 2018-11-14 DIAGNOSIS — Z79899 Other long term (current) drug therapy: Secondary | ICD-10-CM | POA: Diagnosis not present

## 2018-11-14 DIAGNOSIS — J301 Allergic rhinitis due to pollen: Secondary | ICD-10-CM | POA: Diagnosis not present

## 2018-11-14 DIAGNOSIS — I2699 Other pulmonary embolism without acute cor pulmonale: Secondary | ICD-10-CM | POA: Diagnosis not present

## 2018-11-14 DIAGNOSIS — I2782 Chronic pulmonary embolism: Secondary | ICD-10-CM

## 2018-11-14 DIAGNOSIS — I2601 Septic pulmonary embolism with acute cor pulmonale: Secondary | ICD-10-CM

## 2018-11-14 LAB — CBC WITH DIFFERENTIAL (CANCER CENTER ONLY)
Abs Immature Granulocytes: 0.02 10*3/uL (ref 0.00–0.07)
BASOS ABS: 0.1 10*3/uL (ref 0.0–0.1)
BASOS PCT: 1 %
EOS ABS: 0.1 10*3/uL (ref 0.0–0.5)
EOS PCT: 1 %
HCT: 39 % (ref 36.0–46.0)
Hemoglobin: 12.5 g/dL (ref 12.0–15.0)
Immature Granulocytes: 0 %
Lymphocytes Relative: 32 %
Lymphs Abs: 2.1 10*3/uL (ref 0.7–4.0)
MCH: 29.2 pg (ref 26.0–34.0)
MCHC: 32.1 g/dL (ref 30.0–36.0)
MCV: 91.1 fL (ref 80.0–100.0)
Monocytes Absolute: 0.5 10*3/uL (ref 0.1–1.0)
Monocytes Relative: 7 %
NRBC: 0 % (ref 0.0–0.2)
Neutro Abs: 3.8 10*3/uL (ref 1.7–7.7)
Neutrophils Relative %: 59 %
PLATELETS: 298 10*3/uL (ref 150–400)
RBC: 4.28 MIL/uL (ref 3.87–5.11)
RDW: 12.8 % (ref 11.5–15.5)
WBC: 6.5 10*3/uL (ref 4.0–10.5)

## 2018-11-14 LAB — CMP (CANCER CENTER ONLY)
ALBUMIN: 4.2 g/dL (ref 3.5–5.0)
ALT: 18 U/L (ref 0–44)
ANION GAP: 4 — AB (ref 5–15)
AST: 18 U/L (ref 15–41)
Alkaline Phosphatase: 85 U/L (ref 38–126)
BILIRUBIN TOTAL: 0.4 mg/dL (ref 0.3–1.2)
BUN: 17 mg/dL (ref 6–20)
CO2: 29 mmol/L (ref 22–32)
Calcium: 9.5 mg/dL (ref 8.9–10.3)
Chloride: 105 mmol/L (ref 98–111)
Creatinine: 0.73 mg/dL (ref 0.44–1.00)
GFR, Est AFR Am: >60 mL/min (ref >60)
GFR, Estimated: >60 mL/min (ref >60)
GLUCOSE: 126 mg/dL — AB (ref 70–99)
POTASSIUM: 4.7 mmol/L (ref 3.5–5.1)
SODIUM: 138 mmol/L (ref 135–145)
TOTAL PROTEIN: 6.7 g/dL (ref 6.5–8.1)

## 2018-11-21 ENCOUNTER — Encounter: Payer: Self-pay | Admitting: Gastroenterology

## 2018-11-21 ENCOUNTER — Ambulatory Visit: Payer: BLUE CROSS/BLUE SHIELD | Admitting: Gastroenterology

## 2018-11-21 VITALS — BP 112/82 | HR 88 | Ht 65.0 in | Wt 295.1 lb

## 2018-11-21 DIAGNOSIS — Z86010 Personal history of colonic polyps: Secondary | ICD-10-CM | POA: Diagnosis not present

## 2018-11-21 DIAGNOSIS — Z1211 Encounter for screening for malignant neoplasm of colon: Secondary | ICD-10-CM | POA: Diagnosis not present

## 2018-11-21 MED ORDER — NA SULFATE-K SULFATE-MG SULF 17.5-3.13-1.6 GM/177ML PO SOLN: 1.0000 | ORAL | 0 refills | Status: DC

## 2018-11-21 NOTE — Patient Instructions (Signed)

## 2018-11-21 NOTE — Progress Notes (Signed)
Referring Provider: Dian Queen Primary Care Physician:  Hali Marry, MD   Reason for Consultation: Discuss colonoscopy   IMPRESSION:  Personal history of colon polyps    - initially identified on colonoscopy in college    - has been having surveillance colonoscopy every 5 years IBS - diagnosed by Dr. Ferdinand Lango [facility]) BMI 49.11    - currently working with Dr. Leafy Ro for weight management Prior cholecystectomy for cholelithiasis 2003 with post-prandial diarrhea    - controlled with daily greek yogurt Family history of colon cancer (paternal aunt at age 17) Family history of colon polyps (sister)   PLAN: Obtain colonoscopy results from Dr. Darlis Loan with [facility] in [place].  Colonoscopy  I consented the patient at the bedside today discussing the risks, benefits, and alternatives to endoscopic evaluation. In particular, we discussed the risks that include, but are not limited to, reaction to medication, cardiopulmonary compromise, bleeding requiring blood transfusion, aspiration resulting in pneumonia, perforation requiring surgery, lack of diagnosis, severe illness requiring hospitalization, and even death. We reviewed the risk of missed lesion including polyps or even cancer. The patient acknowledges these risks and asks that we proceed.   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 Helane Rima. The history is obtained through the patient, review of her electronic health record, and records provided by Dr. Helane Rima. She has obesity, anxiety, vitamin D deficiency, polycystic ovarian syndrome and prediabetes.  Blood in the stool during college. Evaluated by Dr. Ferdinand Lango with colonoscopy. Diagnosed with IBS and found to have polyps. Placed on the five year surveillance endoscopy plan.   Cholecystectomy for cholelithiasis with post-prandial stool dumping. Controlled by eating [place]  yogurt. Does not feel this needs additional care.   Currently working with [name] for weight loss.   Paternal aunt with colon cancer at age 61. Sister with colon polyps.  Mother with IBS.    Past Medical History:  Diagnosis Date  . Abnormal Pap smear and cervical HPV (human papillomavirus)   . Anxiety   . Arthritis    BAck and sacral joint  . Back pain   . Colon polyps   . Constipation   . Gallbladder problem   . Generalized anxiety disorder   . IBS (irritable bowel syndrome)   . Infertility, female   . Joint pain   . Multiple food allergies    Shellfish, cinnamon and yeast  . Obesity   . PCOS (polycystic ovarian syndrome)   . Prediabetes   . Pulmonary embolism ([place])   . Seizure disorder ([place])   . Vitamin D deficiency     Past Surgical History:  Procedure Laterality Date  . CHOLECYSTECTOMY    . COLPOSCOPY    . DILATION AND CURETTAGE OF UTERUS    . GYNECOLOGIC CRYOSURGERY    . KNEE SURGERY  right    Current Outpatient Medications  Medication Sig Dispense Refill  . acetaminophen (TYLENOL) 650 MG CR tablet Take 1 tablet (650 mg total) by mouth every 8 (eight) hours as needed for pain. 90 tablet 3  . cetirizine (ZYRTEC) 10 MG tablet Take 10 mg by mouth daily.    . DULoxetine (CYMBALTA) 60 MG capsule Take 1 capsule (60 mg total) by mouth daily. 90 capsule 3  . ELIQUIS 5 MG TABS tablet TAKE 1 TABLET BY MOUTH TWICE A DAY 180 tablet 3  . EPINEPHrine (EPI-PEN) 0.3 mg/0.3 mL SOAJ injection Inject 0.3 mLs (0.3  mg total) into the muscle once as needed (anaphylaxis). 1 Device 1  . fluconazole (DIFLUCAN) 150 MG tablet Take 150 mg by mouth as needed.     . Melatonin 1 MG CAPS Take 1 capsule by mouth at bedtime.    . Vitamin D, Ergocalciferol, (DRISDOL) 1.25 MG (50000 UT) CAPS capsule Take 1 capsule (50,000 Units total) by mouth every 7 (seven) days. 4 capsule 0   No current facility-administered medications for this visit.     Allergies as of 11/21/2018 - Review  Complete 11/21/2018  Allergen Reaction Noted  . Morphine and related Other (See Comments), Hives, and Rash 07/08/2011  . Shellfish allergy Hives, Swelling, and Anaphylaxis 07/08/2011  . Codeine Hives and Itching 02/03/2011  . Cinnamon  07/15/2017  . Robitussin a-c [guaifenesin-codeine] Itching 07/08/2011  . Tramadol Itching 10/15/2017  . Yeast-related products Hives 09/24/2016    Family History  Problem Relation Age of Onset  . Hypertension Father   . Depression Father   . Anxiety disorder Father   . Sleep apnea Father   . Obesity Father   . Diabetes Paternal Grandmother   . Hypertension Paternal Grandmother   . Stroke Paternal Grandmother   . Diabetes Paternal Grandfather   . Hypertension Paternal Grandfather   . Stroke Paternal Grandfather   . Hypertension Mother   . Depression Mother   . Anxiety disorder Mother   . Eating disorder Mother     Social History   Socioeconomic History  . Marital status: Married    Spouse name: Inge Waldroup  . Number of children: 2  . Years of education: Not on file  . Highest education level: Not on file  Occupational History  . Occupation: Art gallery manager  Social Needs  . Financial resource strain: Not on file  . Food insecurity:    Worry: Not on file    Inability: Not on file  . Transportation needs:    Medical: Not on file    Non-medical: Not on file  Tobacco Use  . Smoking status: Never Smoker  . Smokeless tobacco: Never Used  Substance and Sexual Activity  . Alcohol use: No  . Drug use: No  . Sexual activity: Not Currently  Lifestyle  . Physical activity:    Days per week: Not on file    Minutes per session: Not on file  . Stress: Not on file  Relationships  . Social connections:    Talks on phone: Not on file    Gets together: Not on file    Attends religious service: Not on file    Active member of club or organization: Not on file    Attends meetings of clubs or organizations: Not on file     Relationship status: Not on file  . Intimate partner violence:    Fear of current or ex partner: Not on file    Emotionally abused: Not on file    Physically abused: Not on file    Forced sexual activity: Not on file  Other Topics Concern  . Not on file  Social History Narrative  . Not on file    Review of Systems: 12 system ROS is negative except as noted above.  Filed Weights   11/21/18 1549  Weight: 295 lb 2 oz (133.9 kg)    Physical Exam: Vital signs were reviewed. General:   Alert, well-nourished, pleasant and cooperative in NAD Head:  Normocephalic and atraumatic. Eyes:  Sclera clear, no icterus.   Conjunctiva pink. Mouth:  No deformity or lesions.   Neck:  Supple; no thyromegaly. Lungs:  Clear throughout to auscultation.   No wheezes.  Heart:  Regular rate and rhythm; no murmurs Abdomen:  Soft, nontender, normal bowel sounds. No rebound or guarding. No hepatosplenomegaly Rectal:  Deferred  Msk:  Symmetrical without gross deformities. Extremities:  No gross deformities or edema. Neurologic:  Alert and  oriented x4;  grossly nonfocal Skin:  No rash or bruise. Psych:  Alert and cooperative. Normal mood and affect.   Nehemyah Foushee L. Tarri Glenn, MD, MPH [facility] 11/21/2018, 3:54 PM

## 2018-11-22 ENCOUNTER — Encounter: Payer: Self-pay | Admitting: Gastroenterology

## 2018-12-01 ENCOUNTER — Ambulatory Visit (INDEPENDENT_AMBULATORY_CARE_PROVIDER_SITE_OTHER): Payer: BLUE CROSS/BLUE SHIELD | Admitting: Family Medicine

## 2018-12-01 ENCOUNTER — Encounter (INDEPENDENT_AMBULATORY_CARE_PROVIDER_SITE_OTHER): Payer: Self-pay | Admitting: Family Medicine

## 2018-12-01 VITALS — BP 126/76 | HR 90 | Ht 65.0 in | Wt 290.0 lb

## 2018-12-01 DIAGNOSIS — E559 Vitamin D deficiency, unspecified: Secondary | ICD-10-CM | POA: Diagnosis not present

## 2018-12-01 DIAGNOSIS — E66813 Obesity, class 3: Secondary | ICD-10-CM

## 2018-12-01 DIAGNOSIS — R7303 Prediabetes: Secondary | ICD-10-CM | POA: Diagnosis not present

## 2018-12-01 DIAGNOSIS — Z9189 Other specified personal risk factors, not elsewhere classified: Secondary | ICD-10-CM | POA: Diagnosis not present

## 2018-12-01 DIAGNOSIS — Z6841 Body Mass Index (BMI) 40.0 and over, adult: Secondary | ICD-10-CM | POA: Diagnosis not present

## 2018-12-01 NOTE — Progress Notes (Signed)
Office: [phone]  /  Fax: [phone]   HPI:   Chief Complaint: OBESITY Naysa is here to discuss her progress with her obesity treatment plan. She is on the Category 4 plan and is following her eating plan approximately 66 % of the time. She states she is exercising 0 minutes 0 times per week. Anaiza has had increased stress with some mental health issues. She hasn't been able to stay on track, but she tried to be mindful. She is now back on track.  Her weight is 290 lb (131.5 kg) today and has gained 2 pounds since her last visit. She has lost 2 lbs since starting treatment with [name].  Pre-Diabetes Ebelin has a diagnosis of pre-diabetes based on her elevated Hgb A1c and was informed this puts her at greater risk of developing diabetes. She notes polyphagia increased and she had a fasting glucose of 126 this month. She is not taking metformin currently and continues to work on diet and exercise to decrease risk of diabetes. She denies nausea or hypoglycemia.  At risk for diabetes Philana is at higher than average risk for developing diabetes due to her obesity and pre-diabetes. She currently denies polyuria or polydipsia.  Vitamin D Deficiency Ashtan has a diagnosis of vitamin D deficiency. She is currently taking prescription Vit D, but level is not yet at goal. She denies nausea, vomiting or muscle weakness.  ASSESSMENT AND PLAN:  Prediabetes - Plan: metFORMIN (GLUCOPHAGE) 500 MG tablet  Vitamin D deficiency - Plan: Vitamin D, Ergocalciferol, (DRISDOL) 1.25 MG (50000 UT) CAPS capsule  At risk for diabetes mellitus  Class 3 severe obesity with serious comorbidity and body mass index (BMI) of 45.0 to 49.9 in adult, unspecified obesity type ([place])  PLAN:  Pre-Diabetes Babetta will continue to work on weight loss, diet, exercise, and decreasing simple carbohydrates in her diet to help decrease the risk of diabetes. We dicussed metformin including benefits and  risks. She was informed that eating too many simple carbohydrates or too many calories at one sitting increases the likelihood of GI side effects. Addalyne agrees to start metformin 500 mg qhs #30 with no refills. Monae agrees to follow up with our clinic in 2 to 3 weeks as directed to monitor her progress.  Diabetes risk counseling Jolly was given extended (15 minutes) diabetes prevention counseling today. She is 43 y.o. female and has risk factors for diabetes including obesity and pre-diabetes. We discussed intensive lifestyle modifications today with an emphasis on weight loss as well as increasing exercise and decreasing simple carbohydrates in her diet.  Vitamin D Deficiency Aalina was informed that low vitamin D levels contributes to fatigue and are associated with obesity, breast, and colon cancer. Yanisa agrees to continue taking prescription Vit D '@50' ,000 IU every week #4 and we will refill for 1 month. She will follow up for routine testing of vitamin D, at least 2-3 times per year. She was informed of the risk of over-replacement of vitamin D and agrees to not increase her dose unless she discusses this with [name] first. Chasitty agrees to follow up with our clinic in 2 to 3 weeks.  Obesity Jazmyn is currently in the action stage of change. As such, her goal is to continue with weight loss efforts She has agreed to follow the Category 4 plan Shakeila has been instructed to work up to a goal of 150 minutes of combined cardio and strengthening exercise per week for weight loss and overall health benefits. We discussed  the following Behavioral Modification Strategies today: increasing lean protein intake, decreasing simple carbohydrates  and work on meal planning and easy cooking plans   Sumedha has agreed to follow up with our clinic in 2 to 3 weeks. She was informed of the importance of frequent follow up visits to maximize her success with intensive lifestyle  modifications for her multiple health conditions.  ALLERGIES: Allergies  Allergen Reactions  . Morphine And Related Other (See Comments), Hives and Rash    Big mood changes BEHAVIOR CHANGES   . Shellfish Allergy Hives, Swelling and Anaphylaxis    Throat closes  . Codeine Hives and Itching    Pt says she can take percocet  . Cinnamon   . Robitussin A-C [Guaifenesin-Codeine] Itching  . Tramadol Itching  . Yeast-Related Products Hives    MEDICATIONS: Current Outpatient Medications on File Prior to Visit  Medication Sig Dispense Refill  . acetaminophen (TYLENOL) 650 MG CR tablet Take 1 tablet (650 mg total) by mouth every 8 (eight) hours as needed for pain. 90 tablet 3  . cetirizine (ZYRTEC) 10 MG tablet Take 10 mg by mouth daily.    . DULoxetine (CYMBALTA) 60 MG capsule Take 1 capsule (60 mg total) by mouth daily. 90 capsule 3  . ELIQUIS 5 MG TABS tablet TAKE 1 TABLET BY MOUTH TWICE A DAY 180 tablet 3  . EPINEPHrine (EPI-PEN) 0.3 mg/0.3 mL SOAJ injection Inject 0.3 mLs (0.3 mg total) into the muscle once as needed (anaphylaxis). 1 Device 1  . fluconazole (DIFLUCAN) 150 MG tablet Take 150 mg by mouth as needed.     . Melatonin 1 MG CAPS Take 1 capsule by mouth at bedtime.    . Na Sulfate-K Sulfate-Mg Sulf 17.5-3.13-1.6 GM/177ML SOLN Take 1 kit by mouth as directed. 354 mL 0  . Vitamin D, Ergocalciferol, (DRISDOL) 1.25 MG (50000 UT) CAPS capsule Take 1 capsule (50,000 Units total) by mouth every 7 (seven) days. 4 capsule 0   No current facility-administered medications on file prior to visit.     PAST MEDICAL HISTORY: Past Medical History:  Diagnosis Date  . Abnormal Pap smear and cervical HPV (human papillomavirus)   . Anxiety   . Arthritis    BAck and sacral joint  . Back pain   . Colon polyps   . Constipation   . Gallbladder problem   . Generalized anxiety disorder   . IBS (irritable bowel syndrome)   . Infertility, female   . Joint pain   . Multiple food allergies     Shellfish, cinnamon and yeast  . Obesity   . PCOS (polycystic ovarian syndrome)   . Prediabetes   . Pulmonary embolism ([place])   . Seizure disorder ([place])   . Vitamin D deficiency     PAST SURGICAL HISTORY: Past Surgical History:  Procedure Laterality Date  . CHOLECYSTECTOMY    . COLPOSCOPY    . DILATION AND CURETTAGE OF UTERUS    . GYNECOLOGIC CRYOSURGERY    . KNEE SURGERY  right    SOCIAL HISTORY: Social History   Tobacco Use  . Smoking status: Never Smoker  . Smokeless tobacco: Never Used  Substance Use Topics  . Alcohol use: No  . Drug use: No    FAMILY HISTORY: Family History  Problem Relation Age of Onset  . Hypertension Father   . Depression Father   . Anxiety disorder Father   . Sleep apnea Father   . Obesity Father   . Diabetes Paternal Grandmother   .  Hypertension Paternal Grandmother   . Stroke Paternal Grandmother   . Diabetes Paternal Grandfather   . Hypertension Paternal Grandfather   . Stroke Paternal Grandfather   . Hypertension Mother   . Depression Mother   . Anxiety disorder Mother   . Eating disorder Mother     ROS: Review of Systems  Constitutional: Negative for weight loss.  Gastrointestinal: Negative for nausea and vomiting.  Genitourinary: Negative for frequency.  Musculoskeletal:       Negative muscle weakness  Endo/Heme/Allergies: Negative for polydipsia.       Positive polyphagia Negative hypoglycemia    PHYSICAL EXAM: Blood pressure 126/76, pulse 90, height '5\' 5"'  (1.651 m), weight 290 lb (131.5 kg), SpO2 99 %. Body mass index is 48.26 kg/m. Physical Exam Vitals signs reviewed.  Constitutional:      Appearance: Normal appearance. She is obese.  Cardiovascular:     Rate and Rhythm: Normal rate.     Pulses: Normal pulses.  Pulmonary:     Effort: Pulmonary effort is normal.     Breath sounds: Normal breath sounds.  Musculoskeletal: Normal range of motion.  Skin:    General: Skin is warm and dry.  Neurological:      Mental Status: She is alert and oriented to person, place, and time.  Psychiatric:        Mood and Affect: Mood normal.        Behavior: Behavior normal.     RECENT LABS AND TESTS: BMET    Component Value Date/Time   NA 138 11/14/2018 0901   NA 140 10/30/2018 0953   NA 140 10/11/2017 1122   K 4.7 11/14/2018 0901   K 4.2 10/11/2017 1122   CL 105 11/14/2018 0901   CL 103 10/11/2017 1122   CO2 29 11/14/2018 0901   CO2 29 10/11/2017 1122   GLUCOSE 126 (H) 11/14/2018 0901   GLUCOSE 95 10/11/2017 1122   BUN 17 11/14/2018 0901   BUN 10 10/30/2018 0953   BUN 10 10/11/2017 1122   CREATININE 0.73 11/14/2018 0901   CREATININE 0.9 10/11/2017 1122   CALCIUM 9.5 11/14/2018 0901   CALCIUM 8.7 10/11/2017 1122   GFRNONAA >60 11/14/2018 0901   GFRNONAA >89 10/16/2012 1646   GFRAA >60 11/14/2018 0901   GFRAA >89 10/16/2012 1646   Lab Results  Component Value Date   HGBA1C 5.8 (H) 10/30/2018   Lab Results  Component Value Date   INSULIN 10.3 10/30/2018   CBC    Component Value Date/Time   WBC 6.5 11/14/2018 0901   WBC 12.2 (H) 12/04/2017 1103   RBC 4.28 11/14/2018 0901   HGB 12.5 11/14/2018 0901   HGB 12.6 10/30/2018 0953   HGB 13.4 10/11/2017 1122   HCT 39.0 11/14/2018 0901   HCT 38.0 10/30/2018 0953   HCT 40.9 10/11/2017 1122   PLT 298 11/14/2018 0901   PLT 291 10/11/2017 1122   MCV 91.1 11/14/2018 0901   MCV 88 10/30/2018 0953   MCV 92 10/11/2017 1122   MCH 29.2 11/14/2018 0901   MCHC 32.1 11/14/2018 0901   RDW 12.8 11/14/2018 0901   RDW 13.2 10/30/2018 0953   RDW 13.3 10/11/2017 1122   LYMPHSABS 2.1 11/14/2018 0901   LYMPHSABS 2.7 10/30/2018 0953   LYMPHSABS 2.8 10/11/2017 1122   MONOABS 0.5 11/14/2018 0901   EOSABS 0.1 11/14/2018 0901   EOSABS 0.1 10/30/2018 0953   EOSABS 0.0 10/11/2017 1122   BASOSABS 0.1 11/14/2018 0901   BASOSABS 0.1 10/30/2018 3570  BASOSABS 0.0 10/11/2017 1122   Iron/TIBC/Ferritin/ %Sat No results found for: IRON, TIBC, FERRITIN,  IRONPCTSAT Lipid Panel     Component Value Date/Time   CHOL 182 10/30/2018 0953   TRIG 98 10/30/2018 0953   HDL 56 10/30/2018 0953   LDLCALC 106 (H) 10/30/2018 0953   Hepatic Function Panel     Component Value Date/Time   PROT 6.7 11/14/2018 0901   PROT 6.7 10/30/2018 0953   PROT 6.8 10/11/2017 1122   ALBUMIN 4.2 11/14/2018 0901   ALBUMIN 4.1 10/30/2018 0953   AST 18 11/14/2018 0901   ALT 18 11/14/2018 0901   ALT 36 10/11/2017 1122   ALKPHOS 85 11/14/2018 0901   ALKPHOS 83 10/11/2017 1122   BILITOT 0.4 11/14/2018 0901      Component Value Date/Time   TSH 2.280 10/30/2018 0953   TSH 1.351 01/13/2013 1446      OBESITY BEHAVIORAL INTERVENTION VISIT  Today's visit was # 3   Starting weight: 292 lbs Starting date: 10/30/2018 Today's weight : 290 lbs  Today's date: 12/01/2018 Total lbs lost to date: 2    Most Recent Value 12/04/2013 - 12/03/2018  Height '5\' 5"'  (1.651 m) 12/01/2018  Weight 290 lb (131.5 kg) 12/01/2018  BMI (Calculated) 48.26 12/01/2018  BLOOD PRESSURE - SYSTOLIC 332 9/51/8841  BLOOD PRESSURE - DIASTOLIC 76 6/60/6301  Waist Measurement  54 inches 10/30/2018   Body Fat % 53.6 % 12/01/2018  Total Body Water (lbs) 99.6 lbs 12/01/2018  RMR 2640 10/30/2018     ASK: We discussed the diagnosis of obesity with Elijio Miles today and Rayann agreed to give [name] permission to discuss obesity behavioral modification therapy today.  ASSESS: Preslei has the diagnosis of obesity and her BMI today is 48.26 Alfred is in the action stage of change   ADVISE: Kynsie was educated on the multiple health risks of obesity as well as the benefit of weight loss to improve her health. She was advised of the need for long term treatment and the importance of lifestyle modifications to improve her current health and to decrease her risk of future health problems.  AGREE: Multiple dietary modification options and treatment options were discussed and  Allysia  agreed to follow the recommendations documented in the above note.  ARRANGE: Liv was educated on the importance of frequent visits to treat obesity as outlined per CMS and USPSTF guidelines and agreed to schedule her next follow up appointment today.  I, Trixie Dredge, am acting as transcriptionist for Dennard Nip, MD I have reviewed the above documentation for accuracy and completeness, and I agree with the above. -Dennard Nip, MD

## 2018-12-02 MED ORDER — METFORMIN HCL 500 MG PO TABS: 500.0000 mg | ORAL_TABLET | Freq: Every day | ORAL | 0 refills | Status: DC

## 2018-12-02 MED ORDER — VITAMIN D (ERGOCALCIFEROL) 1.25 MG (50000 UNIT) PO CAPS: 50000.0000 [IU] | ORAL_CAPSULE | ORAL | 0 refills | Status: DC

## 2018-12-05 DIAGNOSIS — J301 Allergic rhinitis due to pollen: Secondary | ICD-10-CM | POA: Diagnosis not present

## 2018-12-09 ENCOUNTER — Other Ambulatory Visit: Payer: Self-pay

## 2018-12-09 ENCOUNTER — Inpatient Hospital Stay: Payer: BLUE CROSS/BLUE SHIELD | Attending: Hematology & Oncology | Admitting: Hematology & Oncology

## 2018-12-09 ENCOUNTER — Encounter: Payer: Self-pay | Admitting: Hematology & Oncology

## 2018-12-09 VITALS — BP 138/82 | HR 92 | Temp 98.2°F | Resp 17 | Wt 295.0 lb

## 2018-12-09 DIAGNOSIS — Z7984 Long term (current) use of oral hypoglycemic drugs: Secondary | ICD-10-CM | POA: Diagnosis not present

## 2018-12-09 DIAGNOSIS — Z79899 Other long term (current) drug therapy: Secondary | ICD-10-CM | POA: Diagnosis not present

## 2018-12-09 DIAGNOSIS — E282 Polycystic ovarian syndrome: Secondary | ICD-10-CM | POA: Diagnosis not present

## 2018-12-09 DIAGNOSIS — I2699 Other pulmonary embolism without acute cor pulmonale: Secondary | ICD-10-CM | POA: Insufficient documentation

## 2018-12-09 DIAGNOSIS — I2782 Chronic pulmonary embolism: Secondary | ICD-10-CM

## 2018-12-09 DIAGNOSIS — Z7901 Long term (current) use of anticoagulants: Secondary | ICD-10-CM | POA: Insufficient documentation

## 2018-12-09 DIAGNOSIS — I2601 Septic pulmonary embolism with acute cor pulmonale: Secondary | ICD-10-CM

## 2018-12-09 NOTE — Progress Notes (Signed)
Hematology and Oncology Follow Up Visit  Barbara Mcmahon [id number] 06/07/1976 43 y.o. 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 twice daily-complete 1 year of therapy in September 2019  Eilquis 2.5 mg po BID - start 06/08/2018     Interim History:  [name] is back for follow-up.  So far, she is doing quite well.  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  She is done well with the Eliquis.  She is on the maintenance Eliquis dose.  With all of her traveling, the Eliquis has helped her out.  She is now on metformin to try to help with her weight loss.  She is trying to lose weight.  I know this is been difficult with the polycystic ovaries.  She has had no issues with bowels or bladder.  She has had no issues with cough or shortness of breath.  There is no chest wall pain.  She is had no leg swelling.  Overall, her performance status is ECOG 0.     Medications:  Current Outpatient Medications:  .  acetaminophen (TYLENOL) 650 MG CR tablet, Take 1 tablet (650 mg total) by mouth every 8 (eight) hours as needed for pain., Disp: 90 tablet, Rfl: 3 .  cetirizine (ZYRTEC) 10 MG tablet, Take 10 mg by mouth daily., Disp: , Rfl:  .  DULoxetine (CYMBALTA) 60 MG capsule, Take 1 capsule (60 mg total) by mouth daily., Disp: 90 capsule, Rfl: 3 .  ELIQUIS 5 MG TABS tablet, TAKE 1 TABLET BY MOUTH TWICE A DAY, Disp: 180 tablet, Rfl: 3 .  EPINEPHrine (EPI-PEN) 0.3 mg/0.3 mL SOAJ injection, Inject 0.3 mLs (0.3 mg total) into the muscle once as needed (anaphylaxis)., Disp: 1 Device, Rfl: 1 .  fluconazole (DIFLUCAN) 150 MG tablet, Take 150 mg by mouth as needed. , Disp: , Rfl:  .  Melatonin 1 MG CAPS, Take 1 capsule by mouth at bedtime., Disp: , Rfl:  .  metFORMIN  (GLUCOPHAGE) 500 MG tablet, Take 1 tablet (500 mg total) by mouth daily with breakfast., Disp: 30 tablet, Rfl: 0 .  Na Sulfate-K Sulfate-Mg Sulf 17.5-3.13-1.6 GM/177ML SOLN, Take 1 kit by mouth as directed., Disp: 354 mL, Rfl: 0 .  Vitamin D, Ergocalciferol, (DRISDOL) 1.25 MG (50000 UT) CAPS capsule, Take 1 capsule (50,000 Units total) by mouth every 7 (seven) days., Disp: 4 capsule, Rfl: 0  Allergies:  Allergies  Allergen Reactions  . Morphine And Related Other (See Comments), Hives and Rash    Big mood changes BEHAVIOR CHANGES   . Shellfish Allergy Hives, Swelling and Anaphylaxis    Throat closes  . Codeine Hives and Itching    Pt says she can take percocet  . Cinnamon   . Robitussin A-C [Guaifenesin-Codeine] Itching  . Tramadol Itching  . Yeast-Related Products Hives    Past Medical History, Surgical history, Social history, and Family History were reviewed and updated.  Review of Systems: Review of Systems  Constitutional: Negative.   HENT:  Negative.   Eyes: Negative.   Respiratory: Negative.   Cardiovascular: Negative.   Gastrointestinal: Negative.   Endocrine: Negative.   Genitourinary: Negative.    Musculoskeletal: Negative.   Skin: Negative.   Neurological: Negative.   Hematological: Negative.   Psychiatric/Behavioral: Negative.  Physical Exam:  weight is 295 lb (133.8 kg). Her oral temperature is 98.2 F (36.8 C). Her blood pressure is 138/82 and her pulse is 92. Her respiration is 17 and oxygen saturation is 99%.   Wt Readings from Last 3 Encounters:  12/09/18 295 lb (133.8 kg)  12/01/18 290 lb (131.5 kg)  11/21/18 295 lb 2 oz (133.9 kg)    Physical Exam Vitals signs reviewed.  HENT:     Head: Normocephalic and atraumatic.  Eyes:     Pupils: Pupils are equal, round, and reactive to light.  Neck:     Musculoskeletal: Normal range of motion.  Cardiovascular:     Rate and Rhythm: Normal rate and regular rhythm.     Heart sounds: Normal heart  sounds.  Pulmonary:     Effort: Pulmonary effort is normal.     Breath sounds: Normal breath sounds.  Abdominal:     General: Bowel sounds are normal.     Palpations: Abdomen is soft.  Musculoskeletal: Normal range of motion.        General: No tenderness or deformity.  Lymphadenopathy:     Cervical: No cervical adenopathy.  Skin:    General: Skin is warm and dry.     Findings: No erythema or rash.  Neurological:     Mental Status: She is alert and oriented to person, place, and time.  Psychiatric:        Behavior: Behavior normal.        Thought Content: Thought content normal.        Judgment: Judgment normal.      Lab Results  Component Value Date   WBC 6.5 11/14/2018   HGB 12.5 11/14/2018   HCT 39.0 11/14/2018   MCV 91.1 11/14/2018   PLT 298 11/14/2018     Chemistry      Component Value Date/Time   NA 138 11/14/2018 0901   NA 140 10/30/2018 0953   NA 140 10/11/2017 1122   K 4.7 11/14/2018 0901   K 4.2 10/11/2017 1122   CL 105 11/14/2018 0901   CL 103 10/11/2017 1122   CO2 29 11/14/2018 0901   CO2 29 10/11/2017 1122   BUN 17 11/14/2018 0901   BUN 10 10/30/2018 0953   BUN 10 10/11/2017 1122   CREATININE 0.73 11/14/2018 0901   CREATININE 0.9 10/11/2017 1122      Component Value Date/Time   CALCIUM 9.5 11/14/2018 0901   CALCIUM 8.7 10/11/2017 1122   ALKPHOS 85 11/14/2018 0901   ALKPHOS 83 10/11/2017 1122   AST 18 11/14/2018 0901   ALT 18 11/14/2018 0901   ALT 36 10/11/2017 1122   BILITOT 0.4 11/14/2018 0901       Impression and Plan: [name] is a 43 year old white female.  She has polycystic ovaries.  She had a pulmonary embolism in September 2018.  For right now, we will maintain her on the Eliquis.  She prefers to be on Eliquis given all of her traveling.  I have recommended that she now has a new puppy.  She is very excited about this.  I will plan to see her back in another 6 months.  I will see that we had to make any changes to her  protocol.       Volanda Napoleon, MD 3/3/20208:25 AM

## 2018-12-18 DIAGNOSIS — J301 Allergic rhinitis due to pollen: Secondary | ICD-10-CM | POA: Diagnosis not present

## 2018-12-22 ENCOUNTER — Encounter (INDEPENDENT_AMBULATORY_CARE_PROVIDER_SITE_OTHER): Payer: Self-pay | Admitting: Family Medicine

## 2018-12-22 ENCOUNTER — Other Ambulatory Visit: Payer: Self-pay

## 2018-12-22 ENCOUNTER — Ambulatory Visit (INDEPENDENT_AMBULATORY_CARE_PROVIDER_SITE_OTHER): Payer: BLUE CROSS/BLUE SHIELD | Admitting: Family Medicine

## 2018-12-22 VITALS — BP 140/83 | HR 82 | Ht 65.0 in | Wt 289.0 lb

## 2018-12-22 DIAGNOSIS — Z9189 Other specified personal risk factors, not elsewhere classified: Secondary | ICD-10-CM

## 2018-12-22 DIAGNOSIS — E559 Vitamin D deficiency, unspecified: Secondary | ICD-10-CM

## 2018-12-22 DIAGNOSIS — Z711 Person with feared health complaint in whom no diagnosis is made: Secondary | ICD-10-CM | POA: Diagnosis not present

## 2018-12-22 DIAGNOSIS — Z6841 Body Mass Index (BMI) 40.0 and over, adult: Secondary | ICD-10-CM

## 2018-12-22 MED ORDER — VITAMIN D (ERGOCALCIFEROL) 1.25 MG (50000 UNIT) PO CAPS: 50000.0000 [IU] | ORAL_CAPSULE | ORAL | 0 refills | Status: DC

## 2018-12-22 NOTE — Progress Notes (Signed)
Office: [phone]  /  Fax: [phone]   HPI:   Chief Complaint: OBESITY Barbara Mcmahon is here to discuss her progress with her obesity treatment plan. She is on the Category 4 plan and is following her eating plan approximately 65 % of the time. She states she is exercising 0 minutes 0 times per week. Barbara Mcmahon continues to do well with weight loss on the Category 4 plan. Her hunger is controlled, but she struggled with meal planning at times.  Her weight is 289 lb (131.1 kg) today and has had a weight loss of 1 pounds over a period of 3 weeks since her last visit. She has lost 3 lbs since starting treatment with [name].  Vitamin D Deficiency Barbara Mcmahon has a diagnosis of vitamin D deficiency. She is currently stable on vit D, but is not yet at goal.   At risk for osteopenia and osteoporosis Barbara Mcmahon is at higher risk of osteopenia and osteoporosis due to vitamin D deficiency.   Worried Well Barbara Mcmahon has questions about COVID-19 and the appropriate isolation procedures. She shows no sign of illness and no know contacts with someone infected.  ASSESSMENT AND PLAN:  Vitamin D deficiency - Plan: Vitamin D, Ergocalciferol, (DRISDOL) 1.25 MG (50000 UT) CAPS capsule  Worried well  At risk for osteoporosis  Class 3 severe obesity with serious comorbidity and body mass index (BMI) of 45.0 to 49.9 in adult, unspecified obesity type ([place])  PLAN:  Vitamin D Deficiency Barbara Mcmahon was informed that low vitamin D levels contribute to fatigue and are associated with obesity, breast, and colon cancer. Barbara Mcmahon agrees to continue to take prescription Vit D @50 ,000 IU every week #4 with no refills and will follow up for routine testing of vitamin D, at least 2-3 times per year. She was informed of the risk of over-replacement of vitamin D and agrees to not increase her dose unless she discusses this with [name] first. Barbara Mcmahon agrees to follow up in 3 to 4 weeks as directed.  At risk for osteopenia  and osteoporosis Barbara Mcmahon was given extended (15 minutes) osteoporosis prevention counseling today. Barbara Mcmahon is at risk for osteopenia and osteoporosis due to her vitamin D deficiency. She was encouraged to take her vitamin D and follow her higher calcium diet and increase strengthening exercise to help strengthen her bones and decrease her risk of osteopenia and osteoporosis.  Worried Well Pt was educated on the need for social distancing, no travel and hand washing/hand sanitizing. We discussed symptoms of COVID19 and we discussed the possible need for sequestration and how to deal with this if it happens. Pt offered guidance and reassurance.  Obesity Barbara Mcmahon is currently in the action stage of change. As such, her goal is to continue with weight loss efforts. She has agreed to follow the Category 4 plan. Barbara Mcmahon has been instructed to work up to a goal of 150 minutes of combined cardio and strengthening exercise per week for weight loss and overall health benefits. We discussed the following Behavioral Modification Strategies today: increasing lean protein intake, decreasing simple carbohydrates, work on meal planning and easy cooking plans, ways to avoid boredom eating, better snacking choices, and ways to avoid night time snacking.  Barbara Mcmahon has agreed to follow up with our clinic in 3 to 4 weeks. She was informed of the importance of frequent follow up visits to maximize her success with intensive lifestyle modifications for her multiple health conditions.  ALLERGIES: Allergies  Allergen Reactions  . Morphine And Related Other (See Comments),  Hives and Rash    Big mood changes BEHAVIOR CHANGES   . Shellfish Allergy Hives, Swelling and Anaphylaxis    Throat closes  . Codeine Hives and Itching    Pt says she can take percocet  . Cinnamon   . Robitussin A-C [Guaifenesin-Codeine] Itching  . Tramadol Itching  . Yeast-Related Products Hives    MEDICATIONS: Current Outpatient  Medications on File Prior to Visit  Medication Sig Dispense Refill  . acetaminophen (TYLENOL) 650 MG CR tablet Take 1 tablet (650 mg total) by mouth every 8 (eight) hours as needed for pain. 90 tablet 3  . cetirizine (ZYRTEC) 10 MG tablet Take 10 mg by mouth daily.    . DULoxetine (CYMBALTA) 60 MG capsule Take 1 capsule (60 mg total) by mouth daily. 90 capsule 3  . ELIQUIS 5 MG TABS tablet TAKE 1 TABLET BY MOUTH TWICE A DAY 180 tablet 3  . EPINEPHrine (EPI-PEN) 0.3 mg/0.3 mL SOAJ injection Inject 0.3 mLs (0.3 mg total) into the muscle once as needed (anaphylaxis). 1 Device 1  . fluconazole (DIFLUCAN) 150 MG tablet Take 150 mg by mouth as needed.     . Melatonin 1 MG CAPS Take 1 capsule by mouth at bedtime.    . metFORMIN (GLUCOPHAGE) 500 MG tablet Take 1 tablet (500 mg total) by mouth daily with breakfast. 30 tablet 0  . Na Sulfate-K Sulfate-Mg Sulf 17.5-3.13-1.6 GM/177ML SOLN Take 1 kit by mouth as directed. 354 mL 0   No current facility-administered medications on file prior to visit.     PAST MEDICAL HISTORY: Past Medical History:  Diagnosis Date  . Abnormal Pap smear and cervical HPV (human papillomavirus)   . Anxiety   . Arthritis    BAck and sacral joint  . Back pain   . Colon polyps   . Constipation   . Gallbladder problem   . Generalized anxiety disorder   . IBS (irritable bowel syndrome)   . Infertility, female   . Joint pain   . Multiple food allergies    Shellfish, cinnamon and yeast  . Obesity   . PCOS (polycystic ovarian syndrome)   . Prediabetes   . Pulmonary embolism ([place])   . Seizure disorder ([place])   . Vitamin D deficiency     PAST SURGICAL HISTORY: Past Surgical History:  Procedure Laterality Date  . CHOLECYSTECTOMY    . COLPOSCOPY    . DILATION AND CURETTAGE OF UTERUS    . GYNECOLOGIC CRYOSURGERY    . KNEE SURGERY  right    SOCIAL HISTORY: Social History   Tobacco Use  . Smoking status: Never Smoker  . Smokeless tobacco: Never Used  Substance  Use Topics  . Alcohol use: No  . Drug use: No    FAMILY HISTORY: Family History  Problem Relation Age of Onset  . Hypertension Father   . Depression Father   . Anxiety disorder Father   . Sleep apnea Father   . Obesity Father   . Diabetes Paternal Grandmother   . Hypertension Paternal Grandmother   . Stroke Paternal Grandmother   . Diabetes Paternal Grandfather   . Hypertension Paternal Grandfather   . Stroke Paternal Grandfather   . Hypertension Mother   . Depression Mother   . Anxiety disorder Mother   . Eating disorder Mother     ROS: Review of Systems  Constitutional: Positive for weight loss.   PHYSICAL EXAM: Blood pressure 140/83, pulse 82, height 5' 5"  (1.651 m), weight 289 lb (131.1  kg), SpO2 97 %. Body mass index is 48.09 kg/m. Physical Exam Vitals signs reviewed.  Constitutional:      Appearance: Normal appearance. She is obese.  Cardiovascular:     Rate and Rhythm: Normal rate.  Pulmonary:     Effort: Pulmonary effort is normal.  Musculoskeletal: Normal range of motion.  Skin:    General: Skin is warm and dry.  Neurological:     Mental Status: She is alert and oriented to person, place, and time.  Psychiatric:        Mood and Affect: Mood normal.        Behavior: Behavior normal.    RECENT LABS AND TESTS: BMET    Component Value Date/Time   NA 138 11/14/2018 0901   NA 140 10/30/2018 0953   NA 140 10/11/2017 1122   K 4.7 11/14/2018 0901   K 4.2 10/11/2017 1122   CL 105 11/14/2018 0901   CL 103 10/11/2017 1122   CO2 29 11/14/2018 0901   CO2 29 10/11/2017 1122   GLUCOSE 126 (H) 11/14/2018 0901   GLUCOSE 95 10/11/2017 1122   BUN 17 11/14/2018 0901   BUN 10 10/30/2018 0953   BUN 10 10/11/2017 1122   CREATININE 0.73 11/14/2018 0901   CREATININE 0.9 10/11/2017 1122   CALCIUM 9.5 11/14/2018 0901   CALCIUM 8.7 10/11/2017 1122   GFRNONAA >60 11/14/2018 0901   GFRNONAA >89 10/16/2012 1646   GFRAA >60 11/14/2018 0901   GFRAA >89 10/16/2012  1646   Lab Results  Component Value Date   HGBA1C 5.8 (H) 10/30/2018   Lab Results  Component Value Date   INSULIN 10.3 10/30/2018   CBC    Component Value Date/Time   WBC 6.5 11/14/2018 0901   WBC 12.2 (H) 12/04/2017 1103   RBC 4.28 11/14/2018 0901   HGB 12.5 11/14/2018 0901   HGB 12.6 10/30/2018 0953   HGB 13.4 10/11/2017 1122   HCT 39.0 11/14/2018 0901   HCT 38.0 10/30/2018 0953   HCT 40.9 10/11/2017 1122   PLT 298 11/14/2018 0901   PLT 291 10/11/2017 1122   MCV 91.1 11/14/2018 0901   MCV 88 10/30/2018 0953   MCV 92 10/11/2017 1122   MCH 29.2 11/14/2018 0901   MCHC 32.1 11/14/2018 0901   RDW 12.8 11/14/2018 0901   RDW 13.2 10/30/2018 0953   RDW 13.3 10/11/2017 1122   LYMPHSABS 2.1 11/14/2018 0901   LYMPHSABS 2.7 10/30/2018 0953   LYMPHSABS 2.8 10/11/2017 1122   MONOABS 0.5 11/14/2018 0901   EOSABS 0.1 11/14/2018 0901   EOSABS 0.1 10/30/2018 0953   EOSABS 0.0 10/11/2017 1122   BASOSABS 0.1 11/14/2018 0901   BASOSABS 0.1 10/30/2018 0953   BASOSABS 0.0 10/11/2017 1122   Iron/TIBC/Ferritin/ %Sat No results found for: IRON, TIBC, FERRITIN, IRONPCTSAT Lipid Panel     Component Value Date/Time   CHOL 182 10/30/2018 0953   TRIG 98 10/30/2018 0953   HDL 56 10/30/2018 0953   LDLCALC 106 (H) 10/30/2018 0953   Hepatic Function Panel     Component Value Date/Time   PROT 6.7 11/14/2018 0901   PROT 6.7 10/30/2018 0953   PROT 6.8 10/11/2017 1122   ALBUMIN 4.2 11/14/2018 0901   ALBUMIN 4.1 10/30/2018 0953   AST 18 11/14/2018 0901   ALT 18 11/14/2018 0901   ALT 36 10/11/2017 1122   ALKPHOS 85 11/14/2018 0901   ALKPHOS 83 10/11/2017 1122   BILITOT 0.4 11/14/2018 0901      Component Value Date/Time  TSH 2.280 10/30/2018 0953   TSH 1.351 01/13/2013 1446   Results for COUTNEY, WILDERMUTH (MRN [id number]) as of 12/22/2018 17:01  Ref. Range 10/30/2018 09:53  Vitamin D, 25-Hydroxy Latest Ref Range: 30.0 - 100.0 ng/mL 19.6 (L)   OBESITY BEHAVIORAL INTERVENTION VISIT   Today's visit was # 4  Starting weight: 292 lbs Starting date: 10/30/18 Today's weight : Weight: 289 lb (131.1 kg)  Today's date: 12/22/2018 Total lbs lost to date: 3    12/22/2018  Height 5' 5"  (1.651 m)  Weight 289 lb (131.1 kg)  BMI (Calculated) 48.09  BLOOD PRESSURE - SYSTOLIC 340  BLOOD PRESSURE - DIASTOLIC 83   Body Fat % 37.0 %   ASK: We discussed the diagnosis of obesity with Elijio Miles today and Donnetta agreed to give [name] permission to discuss obesity behavioral modification therapy today.  ASSESS: Kameron has the diagnosis of obesity and her BMI today is 48.09. Jimi is in the action stage of change.   ADVISE: Barbarajean was educated on the multiple health risks of obesity as well as the benefit of weight loss to improve her health. She was advised of the need for long term treatment and the importance of lifestyle modifications to improve her current health and to decrease her risk of future health problems.  AGREE: Multiple dietary modification options and treatment options were discussed and Ignacia agreed to follow the recommendations documented in the above note.  ARRANGE: Shailyn was educated on the importance of frequent visits to treat obesity as outlined per CMS and USPSTF guidelines and agreed to schedule her next follow up appointment today.  IMarcille Blanco, CMA, am acting as transcriptionist for Starlyn Skeans, MD  I have reviewed the above documentation for accuracy and completeness, and I agree with the above. -Dennard Nip, MD

## 2018-12-26 ENCOUNTER — Encounter: Payer: Self-pay | Admitting: Family Medicine

## 2018-12-26 DIAGNOSIS — M47816 Spondylosis without myelopathy or radiculopathy, lumbar region: Secondary | ICD-10-CM

## 2018-12-28 MED ORDER — DULOXETINE HCL 30 MG PO CPEP: 90.0000 mg | ORAL_CAPSULE | Freq: Every day | ORAL | 1 refills | Status: DC

## 2018-12-30 ENCOUNTER — Encounter (INDEPENDENT_AMBULATORY_CARE_PROVIDER_SITE_OTHER): Payer: Self-pay

## 2018-12-30 DIAGNOSIS — J301 Allergic rhinitis due to pollen: Secondary | ICD-10-CM | POA: Diagnosis not present

## 2019-01-05 ENCOUNTER — Encounter: Payer: Self-pay | Admitting: Family Medicine

## 2019-01-06 ENCOUNTER — Encounter (INDEPENDENT_AMBULATORY_CARE_PROVIDER_SITE_OTHER): Payer: Self-pay | Admitting: Family Medicine

## 2019-01-09 ENCOUNTER — Encounter: Payer: BLUE CROSS/BLUE SHIELD | Admitting: Gastroenterology

## 2019-01-12 ENCOUNTER — Ambulatory Visit (INDEPENDENT_AMBULATORY_CARE_PROVIDER_SITE_OTHER): Payer: BLUE CROSS/BLUE SHIELD | Admitting: Family Medicine

## 2019-01-12 ENCOUNTER — Encounter (INDEPENDENT_AMBULATORY_CARE_PROVIDER_SITE_OTHER): Payer: Self-pay | Admitting: Family Medicine

## 2019-01-12 ENCOUNTER — Other Ambulatory Visit: Payer: Self-pay

## 2019-01-12 DIAGNOSIS — R7303 Prediabetes: Secondary | ICD-10-CM

## 2019-01-12 DIAGNOSIS — Z6841 Body Mass Index (BMI) 40.0 and over, adult: Secondary | ICD-10-CM | POA: Diagnosis not present

## 2019-01-12 DIAGNOSIS — E559 Vitamin D deficiency, unspecified: Secondary | ICD-10-CM

## 2019-01-12 MED ORDER — METFORMIN HCL 500 MG PO TABS: 500.0000 mg | ORAL_TABLET | Freq: Every day | ORAL | 0 refills | Status: DC

## 2019-01-12 MED ORDER — VITAMIN D (ERGOCALCIFEROL) 1.25 MG (50000 UNIT) PO CAPS: 50000.0000 [IU] | ORAL_CAPSULE | ORAL | 0 refills | Status: DC

## 2019-01-12 NOTE — Progress Notes (Signed)
Office: [phone]  /  Fax: [phone] TeleHealth Visit:  Barbara Mcmahon has verbally consented to this TeleHealth visit today. The patient is located at home, the provider is located at the [facility] office. The participants in this visit include the listed provider and patient. The visit was conducted today via Skype.  HPI:   Chief Complaint: OBESITY Barbara Mcmahon is here to discuss her progress with her obesity treatment plan. She is on the Category 4 plan and is following her eating plan approximately 90 % of the time. She states she is exercising 0 minutes 0 times per week. Barbara Mcmahon has done well maintaining weight while isolated. The family is doing nightly dance parties with her kids. She is struggling to eat all her protein at time, but has done well reducing snacking.   We were unable to weigh the patient today for this TeleHealth visit. She feels as if she has maintained weight since her last visit. She has lost 3 lbs since starting treatment with [name].  Pre-Diabetes Barbara Mcmahon has a diagnosis of pre-diabetes based on her elevated Hgb A1c and was informed this puts her at greater risk of developing diabetes. She is stable on metformin and diet and currently and continues to work on diet and exercise to decrease risk of diabetes. Barbara Mcmahon is doing well with her diet and has increased exercising over the last 2 weeks. She denies GI upset or hypoglycemia.   Vitamin D Deficiency Barbara Mcmahon has a diagnosis of vitamin D deficiency. She is currently stable on vit D, but is not yet at goal. Barbara Mcmahon denies nausea, vomiting, or muscle weakness.  ASSESSMENT AND PLAN:  Prediabetes - Plan: metFORMIN (GLUCOPHAGE) 500 MG tablet  Vitamin D deficiency - Plan: Vitamin D, Ergocalciferol, (DRISDOL) 1.25 MG (50000 UT) CAPS capsule  Class 3 severe obesity with serious comorbidity and body mass index (BMI) of 45.0 to 49.9 in adult, unspecified obesity type  [facility])  PLAN:  Pre-Diabetes Barbara Mcmahon will continue to work on weight loss, exercise, and decreasing simple carbohydrates in her diet to help decrease the risk of diabetes. She was informed that eating too many simple carbohydrates or too many calories at one sitting increases the likelihood of GI side effects. Barbara Mcmahon agreed to continue metformin 500 mg qAM #30 with no refills and a prescription was written today. Barbara Mcmahon agreed to follow up with [name] as directed to monitor her progress in 2 to 3 weeks.  Vitamin D Deficiency Barbara Mcmahon was informed that low vitamin D levels contribute to fatigue and are associated with obesity, breast, and colon cancer. Barbara Mcmahon agrees to continue to take prescription Vit D _0 ,000 IU every week #4 with no refills and will follow up for routine testing of vitamin D, at least 2-3 times per year. She was informed of the risk of over-replacement of vitamin D and agrees to not increase her dose unless she discusses this with [name] first. Barbara Mcmahon agrees to follow up in 2 to 3 weeks as directed.  Obesity Barbara Mcmahon is currently in the action stage of change. As such, her goal is to continue with weight loss efforts. She has agreed to follow the Category 4 plan. Barbara Mcmahon has been instructed to work up to a goal of 150 minutes of combined cardio and strengthening exercise per week for weight loss and overall health benefits. We discussed the following Behavioral Modification Strategies today: increasing lean protein intake, work on meal planning and easy cooking plans, ways to avoid boredom eating, better snacking choices, and ways  to avoid night time snacking.  Barbara Mcmahon has agreed to follow up with our clinic in 2 to 3 weeks. She was informed of the importance of frequent follow up visits to maximize her success with intensive lifestyle modifications for her multiple health conditions.  ALLERGIES: Allergies  Allergen Reactions   Morphine And Related Other (See  Comments), Hives and Rash    Big mood changes BEHAVIOR CHANGES    Shellfish Allergy Hives, Swelling and Anaphylaxis    Throat closes   Codeine Hives and Itching    Pt says she can take percocet   Cinnamon    Robitussin A-C [Guaifenesin-Codeine] Itching   Tramadol Itching   Yeast-Related Products Hives    MEDICATIONS: Current Outpatient Medications on File Prior to Visit  Medication Sig Dispense Refill   acetaminophen (TYLENOL) 650 MG CR tablet Take 1 tablet (650 mg total) by mouth every 8 (eight) hours as needed for pain. 90 tablet 3   cetirizine (ZYRTEC) 10 MG tablet Take 10 mg by mouth daily.     DULoxetine (CYMBALTA) 30 MG capsule Take 3 capsules (90 mg total) by mouth daily for 30 days. 90 capsule 1   ELIQUIS 5 MG TABS tablet TAKE 1 TABLET BY MOUTH TWICE A DAY 180 tablet 3   EPINEPHrine (EPI-PEN) 0.3 mg/0.3 mL SOAJ injection Inject 0.3 mLs (0.3 mg total) into the muscle once as needed (anaphylaxis). 1 Device 1   fluconazole (DIFLUCAN) 150 MG tablet Take 150 mg by mouth as needed.      Melatonin 1 MG CAPS Take 1 capsule by mouth at bedtime.     Na Sulfate-K Sulfate-Mg Sulf 17.5-3.13-1.6 GM/177ML SOLN Take 1 kit by mouth as directed. 354 mL 0   No current facility-administered medications on file prior to visit.     PAST MEDICAL HISTORY: Past Medical History:  Diagnosis Date   Abnormal Pap smear and cervical HPV (human papillomavirus)    Anxiety    Arthritis    BAck and sacral joint   Back pain    Colon polyps    Constipation    Gallbladder problem    Generalized anxiety disorder    IBS (irritable bowel syndrome)    Infertility, female    Joint pain    Multiple food allergies    Shellfish, cinnamon and yeast   Obesity    PCOS (polycystic ovarian syndrome)    Prediabetes    Pulmonary embolism ([facility])    Seizure disorder ([facility])    Vitamin D deficiency     PAST SURGICAL HISTORY: Past Surgical History:  Procedure Laterality Date    CHOLECYSTECTOMY     COLPOSCOPY     DILATION AND CURETTAGE OF UTERUS     GYNECOLOGIC CRYOSURGERY     KNEE SURGERY  right    SOCIAL HISTORY: Social History   Tobacco Use   Smoking status: Never Smoker   Smokeless tobacco: Never Used  Substance Use Topics   Alcohol use: No   Drug use: No    FAMILY HISTORY: Family History  Problem Relation Age of Onset   Hypertension Father    Depression Father    Anxiety disorder Father    Sleep apnea Father    Obesity Father    Diabetes Paternal Grandmother    Hypertension Paternal Grandmother    Stroke Paternal Grandmother    Diabetes Paternal Grandfather    Hypertension Paternal Grandfather    Stroke Paternal Grandfather    Hypertension Mother    Depression Mother    Anxiety  disorder Mother    Eating disorder Mother     ROS: Review of Systems  Gastrointestinal: Negative for nausea and vomiting.  Musculoskeletal:       Negative for muscle weakness.  Endo/Heme/Allergies:       Negative for hypoglycemia.    PHYSICAL EXAM: Pt in no acute distress  RECENT LABS AND TESTS: BMET    Component Value Date/Time   NA 138 11/14/2018 0901   NA 140 10/30/2018 0953   NA 140 10/11/2017 1122   K 4.7 11/14/2018 0901   K 4.2 10/11/2017 1122   CL 105 11/14/2018 0901   CL 103 10/11/2017 1122   CO2 29 11/14/2018 0901   CO2 29 10/11/2017 1122   GLUCOSE 126 (H) 11/14/2018 0901   GLUCOSE 95 10/11/2017 1122   BUN 17 11/14/2018 0901   BUN 10 10/30/2018 0953   BUN 10 10/11/2017 1122   CREATININE 0.73 11/14/2018 0901   CREATININE 0.9 10/11/2017 1122   CALCIUM 9.5 11/14/2018 0901   CALCIUM 8.7 10/11/2017 1122   GFRNONAA >60 11/14/2018 0901   GFRNONAA >89 10/16/2012 1646   GFRAA >60 11/14/2018 0901   GFRAA >89 10/16/2012 1646   Lab Results  Component Value Date   HGBA1C 5.8 (H) 10/30/2018   Lab Results  Component Value Date   INSULIN 10.3 10/30/2018   CBC    Component Value Date/Time   WBC 6.5 11/14/2018  0901   WBC 12.2 (H) 12/04/2017 1103   RBC 4.28 11/14/2018 0901   HGB 12.5 11/14/2018 0901   HGB 12.6 10/30/2018 0953   HGB 13.4 10/11/2017 1122   HCT 39.0 11/14/2018 0901   HCT 38.0 10/30/2018 0953   HCT 40.9 10/11/2017 1122   PLT 298 11/14/2018 0901   PLT 291 10/11/2017 1122   MCV 91.1 11/14/2018 0901   MCV 88 10/30/2018 0953   MCV 92 10/11/2017 1122   MCH 29.2 11/14/2018 0901   MCHC 32.1 11/14/2018 0901   RDW 12.8 11/14/2018 0901   RDW 13.2 10/30/2018 0953   RDW 13.3 10/11/2017 1122   LYMPHSABS 2.1 11/14/2018 0901   LYMPHSABS 2.7 10/30/2018 0953   LYMPHSABS 2.8 10/11/2017 1122   MONOABS 0.5 11/14/2018 0901   EOSABS 0.1 11/14/2018 0901   EOSABS 0.1 10/30/2018 0953   EOSABS 0.0 10/11/2017 1122   BASOSABS 0.1 11/14/2018 0901   BASOSABS 0.1 10/30/2018 0953   BASOSABS 0.0 10/11/2017 1122   Iron/TIBC/Ferritin/ %Sat No results found for: IRON, TIBC, FERRITIN, IRONPCTSAT Lipid Panel     Component Value Date/Time   CHOL 182 10/30/2018 0953   TRIG 98 10/30/2018 0953   HDL 56 10/30/2018 0953   LDLCALC 106 (H) 10/30/2018 0953   Hepatic Function Panel     Component Value Date/Time   PROT 6.7 11/14/2018 0901   PROT 6.7 10/30/2018 0953   PROT 6.8 10/11/2017 1122   ALBUMIN 4.2 11/14/2018 0901   ALBUMIN 4.1 10/30/2018 0953   AST 18 11/14/2018 0901   ALT 18 11/14/2018 0901   ALT 36 10/11/2017 1122   ALKPHOS 85 11/14/2018 0901   ALKPHOS 83 10/11/2017 1122   BILITOT 0.4 11/14/2018 0901      Component Value Date/Time   TSH 2.280 10/30/2018 0953   TSH 1.351 01/13/2013 1446   Results for MIKAILI, FLIPPIN (MRN [id number]) as of 01/12/2019 12:45  Ref. Range 10/30/2018 09:53  Vitamin D, 25-Hydroxy Latest Ref Range: 30.0 - 100.0 ng/mL 19.6 (L)     I, Marcille Blanco, CMA, am acting as Location manager for  Starlyn Skeans, MD I have reviewed the above documentation for accuracy and completeness, and I agree with the above. -Dennard Nip, MD

## 2019-01-13 DIAGNOSIS — J301 Allergic rhinitis due to pollen: Secondary | ICD-10-CM | POA: Diagnosis not present

## 2019-01-13 NOTE — Telephone Encounter (Signed)
I have called the insurance 3 separate time and have been unsuccessful to get Cymbalta authorized and the patient is aware. I called the pharmacy to let them know and see what the cash price is for the patient. With the discount card it will still be expensive.  I have tried to call the patient to see if she has used Goodrx in the past and waiting on a return call.

## 2019-01-13 NOTE — Telephone Encounter (Signed)
Patient has called back and states that she has been on the 60 mg of Cymbalta in the past and wants to know if you can send in a separate prescription to the pharmacy and get this cheaper. This way she will only have to take 1 tablet and have the 30 as a back up if needed.

## 2019-01-14 ENCOUNTER — Telehealth: Payer: Self-pay | Admitting: Family Medicine

## 2019-01-14 DIAGNOSIS — M47816 Spondylosis without myelopathy or radiculopathy, lumbar region: Secondary | ICD-10-CM

## 2019-01-14 MED ORDER — DULOXETINE HCL 30 MG PO CPEP: 90.0000 mg | ORAL_CAPSULE | Freq: Every day | ORAL | 1 refills | Status: DC

## 2019-01-14 NOTE — Telephone Encounter (Signed)
Patient called and is unable to pay the price for Cymbalta at [facility] and she wants it switched over to [facility] and I spoke with Marya Amsler at [facility] to cancel the Cymbalta and it has been sent to [facility] for a refill. FYI.  Disregard the previous note about switching to 60 mg Cymbalta.

## 2019-01-19 ENCOUNTER — Encounter: Payer: Self-pay | Admitting: Family Medicine

## 2019-01-19 ENCOUNTER — Ambulatory Visit (INDEPENDENT_AMBULATORY_CARE_PROVIDER_SITE_OTHER): Payer: BLUE CROSS/BLUE SHIELD | Admitting: Family Medicine

## 2019-01-19 VITALS — Temp 97.4°F | Ht 65.0 in

## 2019-01-19 DIAGNOSIS — Z6841 Body Mass Index (BMI) 40.0 and over, adult: Secondary | ICD-10-CM

## 2019-01-19 DIAGNOSIS — J0100 Acute maxillary sinusitis, unspecified: Secondary | ICD-10-CM

## 2019-01-19 MED ORDER — AMOXICILLIN-POT CLAVULANATE 875-125 MG PO TABS: 1.0000 | ORAL_TABLET | Freq: Two times a day (BID) | ORAL | 0 refills | Status: DC

## 2019-01-19 MED ORDER — FLUCONAZOLE 150 MG PO TABS: 150.0000 mg | ORAL_TABLET | Freq: Once | ORAL | 1 refills | Status: AC

## 2019-01-19 NOTE — Progress Notes (Signed)
Pt reports about for 7 days she has a lot of sinus and ear pressure, watery eyes. She has tried sudafed. Denies f/s/c/n/v  Pt sees Dr. Helane Rima for her paps. Barbara Mcmahon, Barbara Mcmahon, CMA

## 2019-01-19 NOTE — Progress Notes (Signed)
Virtual Visit via Video Note  I connected with Barbara Mcmahon on 01/19/19 at  2:40 PM EDT by a video enabled telemedicine application and verified that I am speaking with the correct person using two identifiers.   I discussed the limitations of evaluation and management by telemedicine and the availability of in person appointments. The patient expressed understanding and agreed to proceed.  Subjective:    CC:   HPI: Pt reports about for 7 days she has a lot of sinus and ear pressure, watery eyes. She has tried sudafed. Denies f/s/c/n/v. She has still been getting her allergy shots.  + HA and burning over the nasal bridge. Pressure over her maxillary sinuses.  Teeth pain for the last few days.  Left ear feels really full. NO ST. No cough or SOB.  + post nasal drip. No GI symptoms.    She is working with Dr. Leafy Ro. She is doing well. She has cut out all junk food.  She is been able to maintain her weight.  She has been getting outside and trying to be active.  She is now working from home and says that actually has been much less stressful for her and getting to spend more time with her children has been great.  Past medical history, Surgical history, Family history not pertinant except as noted below, Social history, Allergies, and medications have been entered into the medical record, reviewed, and corrections made.   Review of Systems: No fevers, chills, night sweats, weight loss, chest pain, or shortness of breath.   Objective:    General: Speaking clearly in complete sentences without any shortness of breath.  Alert and oriented x3.  Normal judgment. No apparent acute distress.    Impression and Recommendations:    Acute sinusitis-based on symptoms for 1 week and not improving now she feels like she is actually getting worse and getting tooth pain and increased ear pain and pressure we will go ahead and treat with Augmentin.  Encouraged her to go ahead and restart her Flonase at  least for the next couple weeks as well think that would be helpful with some the ear pain she is experiencing if not improving or suddenly getting worse and please give [name] call back.  BMI 48/morbidly obese-she is now working with Dr. Leafy Ro for medical management and weight loss and has actually felt like she has been able to do better since being home.  Will need to keep an eye on her blood pressure as it was a little elevated at her last office visit at the weight management center.    I discussed the assessment and treatment plan with the patient. The patient was provided an opportunity to ask questions and all were answered. The patient agreed with the plan and demonstrated an understanding of the instructions.   The patient was advised to call back or seek an in-person evaluation if the symptoms worsen or if the condition fails to improve as anticipated.   Beatrice Lecher, MD

## 2019-01-26 ENCOUNTER — Encounter (INDEPENDENT_AMBULATORY_CARE_PROVIDER_SITE_OTHER): Payer: Self-pay | Admitting: Family Medicine

## 2019-01-26 NOTE — Telephone Encounter (Signed)
Please advise Dr. Leafy Ro out of office this week.

## 2019-01-27 DIAGNOSIS — J301 Allergic rhinitis due to pollen: Secondary | ICD-10-CM | POA: Diagnosis not present

## 2019-02-03 ENCOUNTER — Ambulatory Visit (INDEPENDENT_AMBULATORY_CARE_PROVIDER_SITE_OTHER): Payer: BLUE CROSS/BLUE SHIELD | Admitting: Family Medicine

## 2019-02-03 ENCOUNTER — Encounter (INDEPENDENT_AMBULATORY_CARE_PROVIDER_SITE_OTHER): Payer: Self-pay | Admitting: Family Medicine

## 2019-02-03 ENCOUNTER — Other Ambulatory Visit: Payer: Self-pay

## 2019-02-03 DIAGNOSIS — E282 Polycystic ovarian syndrome: Secondary | ICD-10-CM

## 2019-02-03 DIAGNOSIS — Z6841 Body Mass Index (BMI) 40.0 and over, adult: Secondary | ICD-10-CM | POA: Diagnosis not present

## 2019-02-03 DIAGNOSIS — E66813 Obesity, class 3: Secondary | ICD-10-CM

## 2019-02-03 NOTE — Progress Notes (Signed)
Office: [phone]  /  Fax: [phone] TeleHealth Visit:  Barbara Mcmahon has verbally consented to this TeleHealth visit today. The patient is located at home, the provider is located at the [facility] office. The participants in this visit include the listed provider and patient and any and all parties involved. The visit was conducted today via telephone. Adaja was unable to use realtime audiovisual technology today (WebEx failed) and the telehealth visit was conducted via telephone.  HPI:   Chief Complaint: OBESITY Barbara Mcmahon is here to discuss her progress with her obesity treatment plan. She is on the Category 4 plan and is following her eating plan approximately 75 to 80 % of the time. She states she is exercising 0 minutes 0 times per week. Barbara Mcmahon feels she is doing well with maintaining. She is a lot less active while working from home and she is not sleeping well. Hunger is controlled overall, but she has increased snacking. We were unable to weigh the patient today for this TeleHealth visit. She feels as if she has maintained weight since her last visit. She has lost 3 lbs since starting treatment with Barbara Mcmahon.  PCOS (polycystic ovarian syndrome) [name] has been working on diet and she was on metformin, but she had especially heavy menses while on it, so she stopped metformin approximately ten days ago.  ASSESSMENT AND PLAN:  PCOS (polycystic ovarian syndrome)  Class 3 severe obesity with serious comorbidity and body mass index (BMI) of 45.0 to 49.9 in adult, unspecified obesity type ([place])  PLAN:  PCOS (polycystic ovarian syndrome) Barbara Mcmahon is okay to discontinue metformin and we will recheck labs in 1 month. Sayana will get back to diet and she will increase exercise. She agreed to follow up with our clinic in 2 weeks.  Obesity Barbara Mcmahon is currently in the action stage of change. As such, her goal is to continue with weight loss efforts She  has agreed to follow the Category 4 plan Barbara Mcmahon has been instructed to work up to a goal of 150 minutes of combined cardio and strengthening exercise per week for weight loss and overall health benefits. We discussed the following Behavioral Modification Strategies today: better snacking choices, emotional eating strategies, ways to avoid boredom eating and ways to avoid night time snacking  Barbara Mcmahon has agreed to follow up with our clinic in 2 weeks. She was informed of the importance of frequent follow up visits to maximize her success with intensive lifestyle modifications for her multiple health conditions.  ALLERGIES: Allergies  Allergen Reactions  . Morphine And Related Other (See Comments), Hives and Rash    Big mood changes BEHAVIOR CHANGES   . Shellfish Allergy Hives, Swelling and Anaphylaxis    Throat closes  . Codeine Hives and Itching    Pt says she can take percocet  . Cinnamon   . Robitussin A-C [Guaifenesin-Codeine] Itching  . Tramadol Itching  . Yeast-Related Products Hives    MEDICATIONS: Current Outpatient Medications on File Prior to Visit  Medication Sig Dispense Refill  . acetaminophen (TYLENOL) 650 MG CR tablet Take 1 tablet (650 mg total) by mouth every 8 (eight) hours as needed for pain. 90 tablet 3  . amoxicillin-clavulanate (AUGMENTIN) 875-125 MG tablet Take 1 tablet by mouth 2 (two) times daily. 20 tablet 0  . cetirizine (ZYRTEC) 10 MG tablet Take 10 mg by mouth daily.    . DULoxetine (CYMBALTA) 30 MG capsule Take 3 capsules (90 mg total) by mouth daily for 30 days.  90 capsule 1  . ELIQUIS 5 MG TABS tablet TAKE 1 TABLET BY MOUTH TWICE A DAY 180 tablet 3  . EPINEPHrine (EPI-PEN) 0.3 mg/0.3 mL SOAJ injection Inject 0.3 mLs (0.3 mg total) into the muscle once as needed (anaphylaxis). 1 Device 1  . Melatonin 1 MG CAPS Take 1 capsule by mouth at bedtime.    . Vitamin D, Ergocalciferol, (DRISDOL) 1.25 MG (50000 UT) CAPS capsule Take 1 capsule (50,000 Units  total) by mouth every 7 (seven) days. 4 capsule 0   No current facility-administered medications on file prior to visit.     PAST MEDICAL HISTORY: Past Medical History:  Diagnosis Date  . Abnormal Pap smear and cervical HPV (human papillomavirus)   . Anxiety   . Arthritis    BAck and sacral joint  . Back pain   . Colon polyps   . Constipation   . Gallbladder problem   . Generalized anxiety disorder   . IBS (irritable bowel syndrome)   . Infertility, female   . Joint pain   . Multiple food allergies    Shellfish, cinnamon and yeast  . Obesity   . PCOS (polycystic ovarian syndrome)   . Prediabetes   . Pulmonary embolism ([place])   . Seizure disorder ([place])   . Vitamin D deficiency     PAST SURGICAL HISTORY: Past Surgical History:  Procedure Laterality Date  . CHOLECYSTECTOMY    . COLPOSCOPY    . DILATION AND CURETTAGE OF UTERUS    . GYNECOLOGIC CRYOSURGERY    . KNEE SURGERY  right    SOCIAL HISTORY: Social History   Tobacco Use  . Smoking status: Never Smoker  . Smokeless tobacco: Never Used  Substance Use Topics  . Alcohol use: No  . Drug use: No    FAMILY HISTORY: Family History  Problem Relation Age of Onset  . Hypertension Father   . Depression Father   . Anxiety disorder Father   . Sleep apnea Father   . Obesity Father   . Diabetes Paternal Grandmother   . Hypertension Paternal Grandmother   . Stroke Paternal Grandmother   . Diabetes Paternal Grandfather   . Hypertension Paternal Grandfather   . Stroke Paternal Grandfather   . Hypertension Mother   . Depression Mother   . Anxiety disorder Mother   . Eating disorder Mother     ROS: Review of Systems  Constitutional: Negative for weight loss.    PHYSICAL EXAM: Pt in no acute distress  RECENT LABS AND TESTS: BMET    Component Value Date/Time   NA 138 11/14/2018 0901   NA 140 10/30/2018 0953   NA 140 10/11/2017 1122   K 4.7 11/14/2018 0901   K 4.2 10/11/2017 1122   CL 105 11/14/2018  0901   CL 103 10/11/2017 1122   CO2 29 11/14/2018 0901   CO2 29 10/11/2017 1122   GLUCOSE 126 (H) 11/14/2018 0901   GLUCOSE 95 10/11/2017 1122   BUN 17 11/14/2018 0901   BUN 10 10/30/2018 0953   BUN 10 10/11/2017 1122   CREATININE 0.73 11/14/2018 0901   CREATININE 0.9 10/11/2017 1122   CALCIUM 9.5 11/14/2018 0901   CALCIUM 8.7 10/11/2017 1122   GFRNONAA >60 11/14/2018 0901   GFRNONAA >89 10/16/2012 1646   GFRAA >60 11/14/2018 0901   GFRAA >89 10/16/2012 1646   Lab Results  Component Value Date   HGBA1C 5.8 (H) 10/30/2018   Lab Results  Component Value Date   INSULIN 10.3 10/30/2018  CBC    Component Value Date/Time   WBC 6.5 11/14/2018 0901   WBC 12.2 (H) 12/04/2017 1103   RBC 4.28 11/14/2018 0901   HGB 12.5 11/14/2018 0901   HGB 12.6 10/30/2018 0953   HGB 13.4 10/11/2017 1122   HCT 39.0 11/14/2018 0901   HCT 38.0 10/30/2018 0953   HCT 40.9 10/11/2017 1122   PLT 298 11/14/2018 0901   PLT 291 10/11/2017 1122   MCV 91.1 11/14/2018 0901   MCV 88 10/30/2018 0953   MCV 92 10/11/2017 1122   MCH 29.2 11/14/2018 0901   MCHC 32.1 11/14/2018 0901   RDW 12.8 11/14/2018 0901   RDW 13.2 10/30/2018 0953   RDW 13.3 10/11/2017 1122   LYMPHSABS 2.1 11/14/2018 0901   LYMPHSABS 2.7 10/30/2018 0953   LYMPHSABS 2.8 10/11/2017 1122   MONOABS 0.5 11/14/2018 0901   EOSABS 0.1 11/14/2018 0901   EOSABS 0.1 10/30/2018 0953   EOSABS 0.0 10/11/2017 1122   BASOSABS 0.1 11/14/2018 0901   BASOSABS 0.1 10/30/2018 0953   BASOSABS 0.0 10/11/2017 1122   Iron/TIBC/Ferritin/ %Sat No results found for: IRON, TIBC, FERRITIN, IRONPCTSAT Lipid Panel     Component Value Date/Time   CHOL 182 10/30/2018 0953   TRIG 98 10/30/2018 0953   HDL 56 10/30/2018 0953   LDLCALC 106 (H) 10/30/2018 0953   Hepatic Function Panel     Component Value Date/Time   PROT 6.7 11/14/2018 0901   PROT 6.7 10/30/2018 0953   PROT 6.8 10/11/2017 1122   ALBUMIN 4.2 11/14/2018 0901   ALBUMIN 4.1 10/30/2018 0953    AST 18 11/14/2018 0901   ALT 18 11/14/2018 0901   ALT 36 10/11/2017 1122   ALKPHOS 85 11/14/2018 0901   ALKPHOS 83 10/11/2017 1122   BILITOT 0.4 11/14/2018 0901      Component Value Date/Time   TSH 2.280 10/30/2018 0953   TSH 1.351 01/13/2013 1446    Results for ADALIN, VANDERPLOEG (MRN [id number]) as of 02/03/2019 15:24  Ref. Range 10/30/2018 09:53  Vitamin D, 25-Hydroxy Latest Ref Range: 30.0 - 100.0 ng/mL 19.6 (L)    I, Doreene Nest, am acting as Location manager for Dennard Nip, MD I have reviewed the above documentation for accuracy and completeness, and I agree with the above. -Dennard Nip, MD

## 2019-02-10 DIAGNOSIS — J301 Allergic rhinitis due to pollen: Secondary | ICD-10-CM | POA: Diagnosis not present

## 2019-02-17 ENCOUNTER — Other Ambulatory Visit: Payer: Self-pay

## 2019-02-17 ENCOUNTER — Ambulatory Visit (INDEPENDENT_AMBULATORY_CARE_PROVIDER_SITE_OTHER): Payer: BLUE CROSS/BLUE SHIELD | Admitting: Family Medicine

## 2019-02-17 ENCOUNTER — Encounter (INDEPENDENT_AMBULATORY_CARE_PROVIDER_SITE_OTHER): Payer: Self-pay | Admitting: Family Medicine

## 2019-02-17 DIAGNOSIS — E559 Vitamin D deficiency, unspecified: Secondary | ICD-10-CM

## 2019-02-17 DIAGNOSIS — E7849 Other hyperlipidemia: Secondary | ICD-10-CM

## 2019-02-17 DIAGNOSIS — R7303 Prediabetes: Secondary | ICD-10-CM | POA: Diagnosis not present

## 2019-02-17 DIAGNOSIS — Z6841 Body Mass Index (BMI) 40.0 and over, adult: Secondary | ICD-10-CM

## 2019-02-18 MED ORDER — VITAMIN D (ERGOCALCIFEROL) 1.25 MG (50000 UNIT) PO CAPS: 50000.0000 [IU] | ORAL_CAPSULE | ORAL | 0 refills | Status: DC

## 2019-02-18 NOTE — Telephone Encounter (Signed)
Pt had an appt yesterday added vit D refill to encounter if you will sign?

## 2019-02-18 NOTE — Progress Notes (Signed)
Office: [phone]  /  Fax: [phone] TeleHealth Visit:  Barbara Mcmahon has verbally consented to this TeleHealth visit today. The patient is located at home, the provider is located at the [employer] [facility] office. The participants in this visit include the listed provider and patient. The visit was conducted today via [email or web address].  HPI:   Chief Complaint: OBESITY Barbara Mcmahon is here to discuss her progress with her obesity treatment plan. She is on the Category 4 plan and is following her eating plan approximately 90 % of the time. She states she is using stairs and gardening. Barbara Mcmahon has done well maintaining her weight. She indulged over Mother's day, but felt bloated and has gotten back on track. She enjoys her Category 4 plan and notes that hunger is controlled.  We were unable to weigh the patient today for this TeleHealth visit. She feels as if she has gained weight since her last visit. She has lost 3 lbs since starting treatment with [name].  Pre-Diabetes Barbara Mcmahon has a diagnosis of pre-diabetes based on her elevated Hgb A1c and was informed this puts her at greater risk of developing diabetes. She is doing well on her diet prescription. Her last A1c was 5.8 on 10/30/18. She is not taking metformin currently and continues to work on diet and exercise to decrease risk of diabetes. Barbara Mcmahon is due to have her labs rechecked. She denies nausea, vomiting, or hypoglycemia.   Hyperlipidemia Barbara Mcmahon has hyperlipidemia and her LDL was elevated at 106 on 10/30/18 and she is not on a statin. Barbara Mcmahon has been attempting with diet to improve her cholesterol levels with intensive lifestyle modification including a low saturated fat diet, exercise, and weight loss. She denies any chest pain.  Vitamin D Deficiency Barbara Mcmahon has a diagnosis of vitamin D deficiency. She is currently stable on vit D, but is not yet at goal. She is due for labs. Barbara Mcmahon denies nausea, vomiting, or muscle  weakness.  ASSESSMENT AND PLAN:  Prediabetes - Plan: Comprehensive metabolic panel, Hemoglobin A1c, Insulin, random  Other hyperlipidemia - Plan: Lipid Panel With LDL/HDL Ratio  Vitamin D deficiency - Plan: VITAMIN D 25 Hydroxy (Vit-D Deficiency, Fractures), Vitamin D, Ergocalciferol, (DRISDOL) 1.25 MG (50000 UT) CAPS capsule  Class 3 severe obesity with serious comorbidity and body mass index (BMI) of 45.0 to 49.9 in adult, unspecified obesity type Barbara Mcmahon)  PLAN:  Pre-Diabetes Barbara Mcmahon will continue to work on weight loss, exercise, and decreasing simple carbohydrates in her diet to help decrease the risk of diabetes. She was informed that eating too many simple carbohydrates or too many calories at one sitting increases the likelihood of GI side effects. Myrikal agreed to continue her diet and we will order labs. We will order labs and Barbara Mcmahon agreed to follow up with [name] as directed to monitor her progress in 2 weeks.  Hyperlipidemia Barbara Mcmahon was informed of the American Heart Association Guidelines emphasizing intensive lifestyle modifications as the first line treatment for hyperlipidemia. We discussed many lifestyle modifications today in depth, and Barbara Mcmahon will continue to work on decreasing saturated fats such as fatty red meat, butter and many fried foods. She will also increase vegetables and lean protein in her diet and continue to work on exercise and weight loss efforts. Labs were ordered and Barbara Mcmahon agreed to follow up as directed.  Vitamin D Deficiency Barbara Mcmahon was informed that low vitamin D levels contribute to fatigue and are associated with obesity, breast, and colon cancer. Barbara Mcmahon agrees to continue to take prescription  Vit D @50 ,000 IU every week  and will follow up for routine testing of vitamin D, at least 2-3 times per year. She was informed of the risk of over-replacement of vitamin D and agrees to not increase her dose unless she discusses this with [name] first.  Barbara Mcmahon agrees to follow up in 2 weeks as directed.  Obesity Barbara Mcmahon is currently in the action stage of change. As such, her goal is to continue with weight loss efforts. She has agreed to follow the Category 4 plan. Barbara Mcmahon has been instructed to work up to a goal of 150 minutes of combined cardio and strengthening exercise per week for weight loss and overall health benefits. We discussed the following Behavioral Modification Strategies today: decreasing sodium intake, better snacking choices, and work on meal planning and easy cooking plans.  Barbara Mcmahon has agreed to follow up with our clinic in 2 weeks. She was informed of the importance of frequent follow up visits to maximize her success with intensive lifestyle modifications for her multiple health conditions.  ALLERGIES: Allergies  Allergen Reactions  . Morphine And Related Other (See Comments), Hives and Rash    Big mood changes BEHAVIOR CHANGES   . Shellfish Allergy Hives, Swelling and Anaphylaxis    Throat closes  . Codeine Hives and Itching    Pt says she can take percocet  . Cinnamon   . Robitussin A-C [Guaifenesin-Codeine] Itching  . Tramadol Itching  . Yeast-Related Products Hives    MEDICATIONS: Current Outpatient Medications on File Prior to Visit  Medication Sig Dispense Refill  . acetaminophen (TYLENOL) 650 MG CR tablet Take 1 tablet (650 mg total) by mouth every 8 (eight) hours as needed for pain. 90 tablet 3  . amoxicillin-clavulanate (AUGMENTIN) 875-125 MG tablet Take 1 tablet by mouth 2 (two) times daily. 20 tablet 0  . cetirizine (ZYRTEC) 10 MG tablet Take 10 mg by mouth daily.    . DULoxetine (CYMBALTA) 30 MG capsule Take 3 capsules (90 mg total) by mouth daily for 30 days. 90 capsule 1  . ELIQUIS 5 MG TABS tablet TAKE 1 TABLET BY MOUTH TWICE A DAY 180 tablet 3  . EPINEPHrine (EPI-PEN) 0.3 mg/0.3 mL SOAJ injection Inject 0.3 mLs (0.3 mg total) into the muscle once as needed (anaphylaxis). 1 Device 1   . Melatonin 1 MG CAPS Take 1 capsule by mouth at bedtime.    . Vitamin D, Ergocalciferol, (DRISDOL) 1.25 MG (50000 UT) CAPS capsule Take 1 capsule (50,000 Units total) by mouth every 7 (seven) days. 4 capsule 0   No current facility-administered medications on file prior to visit.     PAST MEDICAL HISTORY: Past Medical History:  Diagnosis Date  . Abnormal Pap smear and cervical HPV (human papillomavirus)   . Anxiety   . Arthritis    BAck and sacral joint  . Back pain   . Colon polyps   . Constipation   . Gallbladder problem   . Generalized anxiety disorder   . IBS (irritable bowel syndrome)   . Infertility, female   . Joint pain   . Multiple food allergies    Shellfish, cinnamon and yeast  . Obesity   . PCOS (polycystic ovarian syndrome)   . Prediabetes   . Pulmonary embolism ([place])   . Seizure disorder ([place])   . Vitamin D deficiency     PAST SURGICAL HISTORY: Past Surgical History:  Procedure Laterality Date  . CHOLECYSTECTOMY    . COLPOSCOPY    . DILATION AND  CURETTAGE OF UTERUS    . GYNECOLOGIC CRYOSURGERY    . KNEE SURGERY  right    SOCIAL HISTORY: Social History   Tobacco Use  . Smoking status: Never Smoker  . Smokeless tobacco: Never Used  Substance Use Topics  . Alcohol use: No  . Drug use: No    FAMILY HISTORY: Family History  Problem Relation Age of Onset  . Hypertension Father   . Depression Father   . Anxiety disorder Father   . Sleep apnea Father   . Obesity Father   . Diabetes Paternal Grandmother   . Hypertension Paternal Grandmother   . Stroke Paternal Grandmother   . Diabetes Paternal Grandfather   . Hypertension Paternal Grandfather   . Stroke Paternal Grandfather   . Hypertension Mother   . Depression Mother   . Anxiety disorder Mother   . Eating disorder Mother     ROS: Review of Systems  Gastrointestinal: Negative for nausea and vomiting.  Musculoskeletal:       Negative for muscle weakness.  Endo/Heme/Allergies:        Negative for hypoglycemia.    PHYSICAL EXAM: Pt in no acute distress  RECENT LABS AND TESTS: BMET    Component Value Date/Time   NA 138 11/14/2018 0901   NA 140 10/30/2018 0953   NA 140 10/11/2017 1122   K 4.7 11/14/2018 0901   K 4.2 10/11/2017 1122   CL 105 11/14/2018 0901   CL 103 10/11/2017 1122   CO2 29 11/14/2018 0901   CO2 29 10/11/2017 1122   GLUCOSE 126 (H) 11/14/2018 0901   GLUCOSE 95 10/11/2017 1122   BUN 17 11/14/2018 0901   BUN 10 10/30/2018 0953   BUN 10 10/11/2017 1122   CREATININE 0.73 11/14/2018 0901   CREATININE 0.9 10/11/2017 1122   CALCIUM 9.5 11/14/2018 0901   CALCIUM 8.7 10/11/2017 1122   GFRNONAA >60 11/14/2018 0901   GFRNONAA >89 10/16/2012 1646   GFRAA >60 11/14/2018 0901   GFRAA >89 10/16/2012 1646   Lab Results  Component Value Date   HGBA1C 5.8 (H) 10/30/2018   Lab Results  Component Value Date   INSULIN 10.3 10/30/2018   CBC    Component Value Date/Time   WBC 6.5 11/14/2018 0901   WBC 12.2 (H) 12/04/2017 1103   RBC 4.28 11/14/2018 0901   HGB 12.5 11/14/2018 0901   HGB 12.6 10/30/2018 0953   HGB 13.4 10/11/2017 1122   HCT 39.0 11/14/2018 0901   HCT 38.0 10/30/2018 0953   HCT 40.9 10/11/2017 1122   PLT 298 11/14/2018 0901   PLT 291 10/11/2017 1122   MCV 91.1 11/14/2018 0901   MCV 88 10/30/2018 0953   MCV 92 10/11/2017 1122   MCH 29.2 11/14/2018 0901   MCHC 32.1 11/14/2018 0901   RDW 12.8 11/14/2018 0901   RDW 13.2 10/30/2018 0953   RDW 13.3 10/11/2017 1122   LYMPHSABS 2.1 11/14/2018 0901   LYMPHSABS 2.7 10/30/2018 0953   LYMPHSABS 2.8 10/11/2017 1122   MONOABS 0.5 11/14/2018 0901   EOSABS 0.1 11/14/2018 0901   EOSABS 0.1 10/30/2018 0953   EOSABS 0.0 10/11/2017 1122   BASOSABS 0.1 11/14/2018 0901   BASOSABS 0.1 10/30/2018 0953   BASOSABS 0.0 10/11/2017 1122   Iron/TIBC/Ferritin/ %Sat No results found for: IRON, TIBC, FERRITIN, IRONPCTSAT Lipid Panel     Component Value Date/Time   CHOL 182 10/30/2018 0953   TRIG  98 10/30/2018 0953   HDL 56 10/30/2018 0953   LDLCALC 106 (  H) 10/30/2018 0953   Hepatic Function Panel     Component Value Date/Time   PROT 6.7 11/14/2018 0901   PROT 6.7 10/30/2018 0953   PROT 6.8 10/11/2017 1122   ALBUMIN 4.2 11/14/2018 0901   ALBUMIN 4.1 10/30/2018 0953   AST 18 11/14/2018 0901   ALT 18 11/14/2018 0901   ALT 36 10/11/2017 1122   ALKPHOS 85 11/14/2018 0901   ALKPHOS 83 10/11/2017 1122   BILITOT 0.4 11/14/2018 0901      Component Value Date/Time   TSH 2.280 10/30/2018 0953   TSH 1.351 01/13/2013 1446    Results for JOZLYNN, PLAIA (MRN [id number]) as of 02/18/2019 09:14  Ref. Range 10/30/2018 09:53  Vitamin D, 25-Hydroxy Latest Ref Range: 30.0 - 100.0 ng/mL 19.6 (L)    I, Marcille Blanco, CMA, am acting as transcriptionist for Starlyn Skeans, MD I have reviewed the above documentation for accuracy and completeness, and I agree with the above. -Dennard Nip, MD

## 2019-02-24 ENCOUNTER — Encounter (INDEPENDENT_AMBULATORY_CARE_PROVIDER_SITE_OTHER): Payer: Self-pay

## 2019-02-24 DIAGNOSIS — J301 Allergic rhinitis due to pollen: Secondary | ICD-10-CM | POA: Diagnosis not present

## 2019-02-26 ENCOUNTER — Encounter (INDEPENDENT_AMBULATORY_CARE_PROVIDER_SITE_OTHER): Payer: Self-pay | Admitting: Family Medicine

## 2019-02-26 NOTE — Telephone Encounter (Signed)
Please advise 

## 2019-03-02 ENCOUNTER — Encounter: Payer: Self-pay | Admitting: Sports Medicine

## 2019-03-02 DIAGNOSIS — M533 Sacrococcygeal disorders, not elsewhere classified: Secondary | ICD-10-CM

## 2019-03-03 NOTE — Assessment & Plan Note (Signed)
Did not respond, not even temporarily to multilevel facet joint injections. She had a set of SI joint injections in November 2018 that provided good relief for almost a year, at the last visit in July 2019 we performed a left sacroiliac joint injection that lasted until recently May 2020. We dropped her back down to 60 of Cymbalta, 120 did not provide any additional benefit. I am also going to refer her for chiropractic manipulation, and we have already discussed referral to bariatric surgery.

## 2019-03-04 ENCOUNTER — Other Ambulatory Visit: Payer: Self-pay

## 2019-03-04 ENCOUNTER — Ambulatory Visit (INDEPENDENT_AMBULATORY_CARE_PROVIDER_SITE_OTHER): Payer: BLUE CROSS/BLUE SHIELD | Admitting: Family Medicine

## 2019-03-04 ENCOUNTER — Encounter (INDEPENDENT_AMBULATORY_CARE_PROVIDER_SITE_OTHER): Payer: Self-pay | Admitting: Family Medicine

## 2019-03-04 DIAGNOSIS — Z6841 Body Mass Index (BMI) 40.0 and over, adult: Secondary | ICD-10-CM

## 2019-03-04 DIAGNOSIS — R7303 Prediabetes: Secondary | ICD-10-CM | POA: Diagnosis not present

## 2019-03-04 MED ORDER — LIRAGLUTIDE 18 MG/3ML ~~LOC~~ SOPN: 0.6000 mg | PEN_INJECTOR | SUBCUTANEOUS | 0 refills | Status: DC

## 2019-03-04 MED ORDER — INSULIN PEN NEEDLE 32G X 4 MM MISC: 1.0000 | Freq: Two times a day (BID) | 0 refills | Status: DC

## 2019-03-04 NOTE — Telephone Encounter (Signed)
Please advise 

## 2019-03-05 NOTE — Progress Notes (Signed)
Office: [phone]  /  Fax: [phone] TeleHealth Visit:  Barbara Mcmahon has verbally consented to this TeleHealth visit today. The patient is located at home, the provider is located at the [employer] [facility] office. The participants in this visit include the listed provider and patient. The visit was conducted today via [email or web address].  HPI:   Chief Complaint: OBESITY Barbara Mcmahon is here to discuss her progress with her obesity treatment plan. She is on the Category 4 plan and is following her eating plan approximately 90 % of the time. She states she is walking 25 minutes 3 times per week. Barbara Mcmahon continues to do well maintaining her weight during [place] isolation. She is struggling with hunger and cannot seem to lose any further weight.  We were unable to weigh the patient today for this TeleHealth visit. She feels as if she has lost weight since her last visit. She has lost 3 lbs since starting treatment with [name].  Pre-Diabetes Barbara Mcmahon has a diagnosis of pre-diabetes based on her elevated Hgb A1c and was informed this puts her at greater risk of developing diabetes. She notes polyphagia that is worse in the afternoon. She could not tolerate metformin due to GI upset. Barbara Mcmahon continues to work on diet and exercise to decrease risk of diabetes.   ASSESSMENT AND PLAN:  Prediabetes - Plan: liraglutide (VICTOZA) 18 MG/3ML SOPN, Insulin Pen Needle (BD PEN NEEDLE NANO 2ND GEN) 32G X 4 MM MISC  Class 3 severe obesity with serious comorbidity and body mass index (BMI) of 45.0 to 49.9 in adult, unspecified obesity type [facility])  PLAN:  Pre-Diabetes Barbara Mcmahon will continue to work on weight loss, exercise, and decreasing simple carbohydrates in her diet to help decrease the risk of diabetes. She was informed that eating too many simple carbohydrates or too many calories at one sitting increases the likelihood of GI side effects. Barbara Mcmahon agreed to start Victoza 0.6 mg Carver qAM #1 and nano  needles #100 with no refills and a prescription was written today. Shandria agreed to follow up with [name] as directed to monitor her progress in 2 weeks.   Obesity Barbara Mcmahon is currently in the action stage of change. As such, her goal is to continue with weight loss efforts. She has agreed to follow the Category 3 plan. Barbara Mcmahon has been instructed to work up to a goal of 150 minutes of combined cardio and strengthening exercise per week for weight loss and overall health benefits. We discussed the following Behavioral Modification Strategies today: increasing lean protein intake, decrease eating out, and ways to avoid night time snacking.  Barbara Mcmahon has agreed to follow up with our clinic in 2 weeks. She was informed of the importance of frequent follow up visits to maximize her success with intensive lifestyle modifications for her multiple health conditions.  ALLERGIES: Allergies  Allergen Reactions  . Morphine And Related Other (See Comments), Hives and Rash    Big mood changes BEHAVIOR CHANGES   . Shellfish Allergy Hives, Swelling and Anaphylaxis    Throat closes  . Codeine Hives and Itching    Pt says she can take percocet  . Cinnamon   . Robitussin A-C [Guaifenesin-Codeine] Itching  . Tramadol Itching  . Yeast-Related Products Hives    MEDICATIONS: Current Outpatient Medications on File Prior to Visit  Medication Sig Dispense Refill  . acetaminophen (TYLENOL) 650 MG CR tablet Take 1 tablet (650 mg total) by mouth every 8 (eight) hours as needed for pain. 90 tablet 3  .  cetirizine (ZYRTEC) 10 MG tablet Take 10 mg by mouth daily.    Marland Kitchen ELIQUIS 5 MG TABS tablet TAKE 1 TABLET BY MOUTH TWICE A DAY 180 tablet 3  . EPINEPHrine (EPI-PEN) 0.3 mg/0.3 mL SOAJ injection Inject 0.3 mLs (0.3 mg total) into the muscle once as needed (anaphylaxis). 1 Device 1  . Melatonin 1 MG CAPS Take 1 capsule by mouth at bedtime.    . Vitamin D, Ergocalciferol, (DRISDOL) 1.25 MG (50000 UT) CAPS capsule  Take 1 capsule (50,000 Units total) by mouth every 7 (seven) days. 4 capsule 0  . DULoxetine (CYMBALTA) 30 MG capsule Take 3 capsules (90 mg total) by mouth daily for 30 days. 90 capsule 1   No current facility-administered medications on file prior to visit.     PAST MEDICAL HISTORY: Past Medical History:  Diagnosis Date  . Abnormal Pap smear and cervical HPV (human papillomavirus)   . Anxiety   . Arthritis    BAck and sacral joint  . Back pain   . Colon polyps   . Constipation   . Gallbladder problem   . Generalized anxiety disorder   . IBS (irritable bowel syndrome)   . Infertility, female   . Joint pain   . Multiple food allergies    Shellfish, cinnamon and yeast  . Obesity   . PCOS (polycystic ovarian syndrome)   . Prediabetes   . Pulmonary embolism ([place])   . Seizure disorder ([place])   . Vitamin D deficiency     PAST SURGICAL HISTORY: Past Surgical History:  Procedure Laterality Date  . CHOLECYSTECTOMY    . COLPOSCOPY    . DILATION AND CURETTAGE OF UTERUS    . GYNECOLOGIC CRYOSURGERY    . KNEE SURGERY  right    SOCIAL HISTORY: Social History   Tobacco Use  . Smoking status: Never Smoker  . Smokeless tobacco: Never Used  Substance Use Topics  . Alcohol use: No  . Drug use: No    FAMILY HISTORY: Family History  Problem Relation Age of Onset  . Hypertension Father   . Depression Father   . Anxiety disorder Father   . Sleep apnea Father   . Obesity Father   . Diabetes Paternal Grandmother   . Hypertension Paternal Grandmother   . Stroke Paternal Grandmother   . Diabetes Paternal Grandfather   . Hypertension Paternal Grandfather   . Stroke Paternal Grandfather   . Hypertension Mother   . Depression Mother   . Anxiety disorder Mother   . Eating disorder Mother     ROS: Review of Systems  Endo/Heme/Allergies:       Positive for polyphagia.    PHYSICAL EXAM: Pt in no acute distress  RECENT LABS AND TESTS: BMET    Component Value Date/Time    NA 138 11/14/2018 0901   NA 140 10/30/2018 0953   NA 140 10/11/2017 1122   K 4.7 11/14/2018 0901   K 4.2 10/11/2017 1122   CL 105 11/14/2018 0901   CL 103 10/11/2017 1122   CO2 29 11/14/2018 0901   CO2 29 10/11/2017 1122   GLUCOSE 126 (H) 11/14/2018 0901   GLUCOSE 95 10/11/2017 1122   BUN 17 11/14/2018 0901   BUN 10 10/30/2018 0953   BUN 10 10/11/2017 1122   CREATININE 0.73 11/14/2018 0901   CREATININE 0.9 10/11/2017 1122   CALCIUM 9.5 11/14/2018 0901   CALCIUM 8.7 10/11/2017 1122   GFRNONAA >60 11/14/2018 0901   GFRNONAA >89 10/16/2012 1646  GFRAA >60 11/14/2018 0901   GFRAA >89 10/16/2012 1646   Lab Results  Component Value Date   HGBA1C 5.8 (H) 10/30/2018   Lab Results  Component Value Date   INSULIN 10.3 10/30/2018   CBC    Component Value Date/Time   WBC 6.5 11/14/2018 0901   WBC 12.2 (H) 12/04/2017 1103   RBC 4.28 11/14/2018 0901   HGB 12.5 11/14/2018 0901   HGB 12.6 10/30/2018 0953   HGB 13.4 10/11/2017 1122   HCT 39.0 11/14/2018 0901   HCT 38.0 10/30/2018 0953   HCT 40.9 10/11/2017 1122   PLT 298 11/14/2018 0901   PLT 291 10/11/2017 1122   MCV 91.1 11/14/2018 0901   MCV 88 10/30/2018 0953   MCV 92 10/11/2017 1122   MCH 29.2 11/14/2018 0901   MCHC 32.1 11/14/2018 0901   RDW 12.8 11/14/2018 0901   RDW 13.2 10/30/2018 0953   RDW 13.3 10/11/2017 1122   LYMPHSABS 2.1 11/14/2018 0901   LYMPHSABS 2.7 10/30/2018 0953   LYMPHSABS 2.8 10/11/2017 1122   MONOABS 0.5 11/14/2018 0901   EOSABS 0.1 11/14/2018 0901   EOSABS 0.1 10/30/2018 0953   EOSABS 0.0 10/11/2017 1122   BASOSABS 0.1 11/14/2018 0901   BASOSABS 0.1 10/30/2018 0953   BASOSABS 0.0 10/11/2017 1122   Iron/TIBC/Ferritin/ %Sat No results found for: IRON, TIBC, FERRITIN, IRONPCTSAT Lipid Panel     Component Value Date/Time   CHOL 182 10/30/2018 0953   TRIG 98 10/30/2018 0953   HDL 56 10/30/2018 0953   LDLCALC 106 (H) 10/30/2018 0953   Hepatic Function Panel     Component Value  Date/Time   PROT 6.7 11/14/2018 0901   PROT 6.7 10/30/2018 0953   PROT 6.8 10/11/2017 1122   ALBUMIN 4.2 11/14/2018 0901   ALBUMIN 4.1 10/30/2018 0953   AST 18 11/14/2018 0901   ALT 18 11/14/2018 0901   ALT 36 10/11/2017 1122   ALKPHOS 85 11/14/2018 0901   ALKPHOS 83 10/11/2017 1122   BILITOT 0.4 11/14/2018 0901      Component Value Date/Time   TSH 2.280 10/30/2018 0953   TSH 1.351 01/13/2013 1446    Results for SHAUNDREA, CARRIGG (MRN [id number]) as of 03/05/2019 08:24  Ref. Range 10/30/2018 09:53  Vitamin D, 25-Hydroxy Latest Ref Range: 30.0 - 100.0 ng/mL 19.6 (L)    I, Marcille Blanco, CMA, am acting as transcriptionist for Starlyn Skeans, MD I have reviewed the above documentation for accuracy and completeness, and I agree with the above. -Dennard Nip, MD

## 2019-03-10 DIAGNOSIS — J301 Allergic rhinitis due to pollen: Secondary | ICD-10-CM | POA: Diagnosis not present

## 2019-03-18 ENCOUNTER — Other Ambulatory Visit: Payer: Self-pay

## 2019-03-18 ENCOUNTER — Ambulatory Visit (INDEPENDENT_AMBULATORY_CARE_PROVIDER_SITE_OTHER): Payer: BC Managed Care – PPO | Admitting: Family Medicine

## 2019-03-18 ENCOUNTER — Encounter (INDEPENDENT_AMBULATORY_CARE_PROVIDER_SITE_OTHER): Payer: Self-pay | Admitting: Family Medicine

## 2019-03-18 DIAGNOSIS — F418 Other specified anxiety disorders: Secondary | ICD-10-CM

## 2019-03-18 DIAGNOSIS — E559 Vitamin D deficiency, unspecified: Secondary | ICD-10-CM | POA: Diagnosis not present

## 2019-03-18 DIAGNOSIS — E8881 Metabolic syndrome: Secondary | ICD-10-CM | POA: Diagnosis not present

## 2019-03-18 DIAGNOSIS — Z6841 Body Mass Index (BMI) 40.0 and over, adult: Secondary | ICD-10-CM

## 2019-03-23 ENCOUNTER — Other Ambulatory Visit: Payer: Self-pay | Admitting: Family Medicine

## 2019-03-23 DIAGNOSIS — M47816 Spondylosis without myelopathy or radiculopathy, lumbar region: Secondary | ICD-10-CM

## 2019-03-23 MED ORDER — VITAMIN D (ERGOCALCIFEROL) 1.25 MG (50000 UNIT) PO CAPS: 50000.0000 [IU] | ORAL_CAPSULE | ORAL | 0 refills | Status: DC

## 2019-03-23 NOTE — Progress Notes (Signed)
Office: [phone]  /  Fax: [phone] TeleHealth Visit:  Barbara Mcmahon has verbally consented to this TeleHealth visit today. The patient is located at home, the provider is located at the [employer] [facility] office. The participants in this visit include the listed provider and patient. The visit was conducted today via [email or web address].  HPI:   Chief Complaint: OBESITY Barbara Mcmahon is here to discuss her progress with her obesity treatment plan. She is on the Category 4 plan and is following her eating plan approximately 95 % of the time. She states she is very active in the pool and taking short walks. Barbara Mcmahon is continues to do well with weight loss and has lost another 2 pounds in the last 2 weeks. She states that her hunger is controlled and she likes the structure and routine of the Category 4 plan. She is struggling with comfort eating at times.  We were unable to weigh the patient today for this TeleHealth visit. She feels as if she has lost weight since her last visit. She has lost 3 lbs since starting treatment with [name].  Vitamin D Deficiency Barbara Mcmahon has a diagnosis of vitamin D deficiency. She is currently stable on vit D, but is not yet at goal. Barbara Mcmahon denies nausea, vomiting, or muscle weakness.  Insulin Resistance Barbara Mcmahon has a diagnosis of insulin resistance based on her elevated fasting insulin level >5. Although Barbara Mcmahon's blood glucose readings are still under good control, insulin resistance puts her at greater risk of metabolic syndrome and diabetes. She is taking Victoza at 0.6 mg and still notes afternoon polyphagia though. Barbara Mcmahon continues to do well on her eating plan and continues to work on diet and exercise to decrease risk of diabetes. She denies nausea, vomiting, or diarrhea.  Depression with Anxiety Barbara Mcmahon has had issues with being judged for her weight by her family since growing up. She feels that she has a very dysfunctional relationship with  food, self esteem, and self image. Barbara Mcmahon requests a referral to therapy. She is struggling with emotional eating and using food for comfort to the extent that it is negatively impacting her health. She often snacks when she is not hungry. Barbara Mcmahon sometimes feels she is out of control and then feels guilty that she made poor food choices. She has been working on behavior modification techniques to help reduce her emotional eating and has been somewhat successful.   Depression screen Barbara Mcmahon 2/9 01/19/2019 10/30/2018 10/14/2017 04/08/2017  Decreased Interest 0 2 0 -  Down, Depressed, Hopeless 0 1 0 0  PHQ - 2 Score 0 3 0 0  Altered sleeping - 1 - 0  Tired, decreased energy - 3 - 0  Change in appetite - 3 - 0  Feeling bad or failure about yourself  - 2 - 0  Trouble concentrating - 0 - 0  Moving slowly or fidgety/restless - 1 - 0  Suicidal thoughts - 0 - 0  PHQ-9 Score - 13 - 0  Difficult doing work/chores - Not difficult at all - -   ASSESSMENT AND PLAN:  Vitamin D deficiency - Plan: Vitamin D, Ergocalciferol, (DRISDOL) 1.25 MG (50000 UT) CAPS capsule  Insulin resistance  Depression with anxiety  Class 3 severe obesity with serious comorbidity and body mass index (BMI) of 45.0 to 49.9 in adult, unspecified obesity type (HCC)  PLAN:  Vitamin D Deficiency Barbara Mcmahon was informed that low vitamin D levels contribute to fatigue and are associated with obesity, breast, and colon cancer. [name] agrees  to continue to take prescription Vit D @50 ,000 IU every week #4 with no refills and will follow up for routine testing of vitamin D, at least 2-3 times per year. She was informed of the risk of over-replacement of vitamin D and agrees to not increase her dose unless she discusses this with [name] first. Baylin agrees to follow up in 2 weeks as directed.  Insulin Resistance Barbara Mcmahon will continue to work on weight loss, exercise, and decreasing simple carbohydrates in her diet to help decrease the  risk of diabetes. She was informed that eating too many simple carbohydrates or too many calories at one sitting increases the likelihood of GI side effects. Clemmie agreed to increase Victoza to 1.2 mg with no refill needed and continue with her diet and exercise. Sapphire agreed to follow up with [name] as directed to monitor her progress.   Depression with Anxiety We discussed behavior modification techniques today to help Shatora deal with her emotional eating and depression. Patient was referred to Dr. Mallie Mcmahon, our bariatric psychologist for evaluation due to elevated PHQ-9 score and significant struggles with emotional eating. Kashana agreed to follow up as directed.  Obesity Barbara Mcmahon is currently in the action stage of change. As such, her goal is to continue with weight loss efforts. She has agreed to follow the Category 4 plan. Raahi has been instructed to work up to a goal of 150 minutes of combined cardio and strengthening exercise per week for weight loss and overall health benefits. We discussed the following Behavioral Modification Strategies today: increasing lean protein intake and emotional eating strategies.  Barbara Mcmahon has agreed to follow up with our clinic in 2 weeks. She was informed of the importance of frequent follow up visits to maximize her success with intensive lifestyle modifications for her multiple health conditions.  ALLERGIES: Allergies  Allergen Reactions   Morphine And Related Other (See Comments), Hives and Rash    Big mood changes BEHAVIOR CHANGES    Shellfish Allergy Hives, Swelling and Anaphylaxis    Throat closes   Codeine Hives and Itching    Pt says she can take percocet   Cinnamon    Robitussin A-C [Guaifenesin-Codeine] Itching   Tramadol Itching   Yeast-Related Products Hives    MEDICATIONS: Current Outpatient Medications on File Prior to Visit  Medication Sig Dispense Refill   acetaminophen (TYLENOL) 650 MG CR tablet Take 1  tablet (650 mg total) by mouth every 8 (eight) hours as needed for pain. 90 tablet 3   cetirizine (ZYRTEC) 10 MG tablet Take 10 mg by mouth daily.     ELIQUIS 5 MG TABS tablet TAKE 1 TABLET BY MOUTH TWICE A DAY 180 tablet 3   EPINEPHrine (EPI-PEN) 0.3 mg/0.3 mL SOAJ injection Inject 0.3 mLs (0.3 mg total) into the muscle once as needed (anaphylaxis). 1 Device 1   Insulin Pen Needle (BD PEN NEEDLE NANO 2ND GEN) 32G X 4 MM MISC 1 Package by Does not apply route 2 (two) times daily. 100 each 0   liraglutide (VICTOZA) 18 MG/3ML SOPN Inject 0.1 mLs (0.6 mg total) into the skin every morning. 1 pen 0   Melatonin 1 MG CAPS Take 1 capsule by mouth at bedtime.     No current facility-administered medications on file prior to visit.     PAST MEDICAL HISTORY: Past Medical History:  Diagnosis Date   Abnormal Pap smear and cervical HPV (human papillomavirus)    Anxiety    Arthritis    BAck and sacral  joint   Back pain    Colon polyps    Constipation    Gallbladder problem    Generalized anxiety disorder    IBS (irritable bowel syndrome)    Infertility, female    Joint pain    Multiple food allergies    Shellfish, cinnamon and yeast   Obesity    PCOS (polycystic ovarian syndrome)    Prediabetes    Pulmonary embolism (HCC)    Seizure disorder (HCC)    Vitamin D deficiency     PAST SURGICAL HISTORY: Past Surgical History:  Procedure Laterality Date   CHOLECYSTECTOMY     COLPOSCOPY     DILATION AND CURETTAGE OF UTERUS     GYNECOLOGIC CRYOSURGERY     KNEE SURGERY  right    SOCIAL HISTORY: Social History   Tobacco Use   Smoking status: Never Smoker   Smokeless tobacco: Never Used  Substance Use Topics   Alcohol use: No   Drug use: No    FAMILY HISTORY: Family History  Problem Relation Age of Onset   Hypertension Father    Depression Father    Anxiety disorder Father    Sleep apnea Father    Obesity Father    Diabetes Paternal  Grandmother    Hypertension Paternal Grandmother    Stroke Paternal Grandmother    Diabetes Paternal Grandfather    Hypertension Paternal Grandfather    Stroke Paternal Grandfather    Hypertension Mother    Depression Mother    Anxiety disorder Mother    Eating disorder Mother     ROS: Review of Systems  Gastrointestinal: Negative for nausea and vomiting.  Musculoskeletal:       Negative for muscle weakness.  Endo/Heme/Allergies:       Positive for polyphagia.  Psychiatric/Behavioral: Positive for depression. The patient is nervous/anxious.     PHYSICAL EXAM: Pt in no acute distress  RECENT LABS AND TESTS: BMET    Component Value Date/Time   NA 138 11/14/2018 0901   NA 140 10/30/2018 0953   NA 140 10/11/2017 1122   K 4.7 11/14/2018 0901   K 4.2 10/11/2017 1122   CL 105 11/14/2018 0901   CL 103 10/11/2017 1122   CO2 29 11/14/2018 0901   CO2 29 10/11/2017 1122   GLUCOSE 126 (H) 11/14/2018 0901   GLUCOSE 95 10/11/2017 1122   BUN 17 11/14/2018 0901   BUN 10 10/30/2018 0953   BUN 10 10/11/2017 1122   CREATININE 0.73 11/14/2018 0901   CREATININE 0.9 10/11/2017 1122   CALCIUM 9.5 11/14/2018 0901   CALCIUM 8.7 10/11/2017 1122   GFRNONAA >60 11/14/2018 0901   GFRNONAA >89 10/16/2012 1646   GFRAA >60 11/14/2018 0901   GFRAA >89 10/16/2012 1646   Lab Results  Component Value Date   HGBA1C 5.8 (H) 10/30/2018   Lab Results  Component Value Date   INSULIN 10.3 10/30/2018   CBC    Component Value Date/Time   WBC 6.5 11/14/2018 0901   WBC 12.2 (H) 12/04/2017 1103   RBC 4.28 11/14/2018 0901   HGB 12.5 11/14/2018 0901   HGB 12.6 10/30/2018 0953   HGB 13.4 10/11/2017 1122   HCT 39.0 11/14/2018 0901   HCT 38.0 10/30/2018 0953   HCT 40.9 10/11/2017 1122   PLT 298 11/14/2018 0901   PLT 291 10/11/2017 1122   MCV 91.1 11/14/2018 0901   MCV 88 10/30/2018 0953   MCV 92 10/11/2017 1122   MCH 29.2 11/14/2018 0901   MCHC  32.1 11/14/2018 0901   RDW 12.8  11/14/2018 0901   RDW 13.2 10/30/2018 0953   RDW 13.3 10/11/2017 1122   LYMPHSABS 2.1 11/14/2018 0901   LYMPHSABS 2.7 10/30/2018 0953   LYMPHSABS 2.8 10/11/2017 1122   MONOABS 0.5 11/14/2018 0901   EOSABS 0.1 11/14/2018 0901   EOSABS 0.1 10/30/2018 0953   EOSABS 0.0 10/11/2017 1122   BASOSABS 0.1 11/14/2018 0901   BASOSABS 0.1 10/30/2018 0953   BASOSABS 0.0 10/11/2017 1122   Iron/TIBC/Ferritin/ %Sat No results found for: IRON, TIBC, FERRITIN, IRONPCTSAT Lipid Panel     Component Value Date/Time   CHOL 182 10/30/2018 0953   TRIG 98 10/30/2018 0953   HDL 56 10/30/2018 0953   LDLCALC 106 (H) 10/30/2018 0953   Hepatic Function Panel     Component Value Date/Time   PROT 6.7 11/14/2018 0901   PROT 6.7 10/30/2018 0953   PROT 6.8 10/11/2017 1122   ALBUMIN 4.2 11/14/2018 0901   ALBUMIN 4.1 10/30/2018 0953   AST 18 11/14/2018 0901   ALT 18 11/14/2018 0901   ALT 36 10/11/2017 1122   ALKPHOS 85 11/14/2018 0901   ALKPHOS 83 10/11/2017 1122   BILITOT 0.4 11/14/2018 0901      Component Value Date/Time   TSH 2.280 10/30/2018 0953   TSH 1.351 01/13/2013 1446   Results for TERIANNA, PEGGS (MRN [id number]) as of 03/23/2019 17:01  Ref. Range 10/30/2018 09:53  Vitamin D, 25-Hydroxy Latest Ref Range: 30.0 - 100.0 ng/mL 19.6 (L)     I, Marcille Blanco, CMA, am acting as transcriptionist for Starlyn Skeans, MD I have reviewed the above documentation for accuracy and completeness, and I agree with the above. -Dennard Nip, MD

## 2019-03-29 ENCOUNTER — Encounter (INDEPENDENT_AMBULATORY_CARE_PROVIDER_SITE_OTHER): Payer: Self-pay | Admitting: Family Medicine

## 2019-03-29 NOTE — Telephone Encounter (Signed)
For Dr. Mallie Mussel visit.

## 2019-03-30 NOTE — Progress Notes (Signed)
Office: [phone]  /  Fax: [phone]    Date: March 31, 2019   Appointment Start Time: 12:00pm Duration:37 minutes Provider: Glennie Isle, Psy.D. Type of Session: Intake for Individual Therapy  Location of Patient: Home Location of Provider: Provider's Home Type of Contact: Telepsychological Visit via [employer] WebEx  Informed Consent: Prior to proceeding with today's appointment, two pieces of identifying information were obtained from [name] to verify identity. In addition, Barbara Mcmahon's physical location at the time of this appointment was obtained. Barbara Mcmahon reported she was at home and provided the address. In the event of technical difficulties, Barbara Mcmahon shared a phone number she could be reached at. Barbara Mcmahon and this provider participated in today's telepsychological service. Also, Barbara Mcmahon denied anyone else being present in the room or on the WebEx appointment.   The provider's role was explained to Barbara Mcmahon. The provider reviewed and discussed issues of confidentiality, privacy, and limits therein (e.g., reporting obligations). In addition to verbal informed consent, written informed consent for psychological services was obtained from Barbara Mcmahon prior to the initial intake interview. Written consent included information concerning the practice, financial arrangements, and confidentiality and patients' rights. Since the clinic is not a 24/7 crisis center, mental health emergency resources were shared, and the provider explained MyChart, e-mail, voicemail, and/or other messaging systems should be utilized only for non-emergency reasons. This provider also explained that information obtained during appointments will be placed in Barbara Mcmahon's medical record in a confidential manner and relevant information will be shared with other providers at [facility] that she meets with for coordination of care. Barbara Mcmahon verbally acknowledged understanding of the  aforementioned, and agreed to use mental health emergency resources discussed if needed. Moreover, Barbara Mcmahon agreed information may be shared with other [facility] providers as needed for coordination of care. By signing the service agreement document, Barbara Mcmahon provided written consent for coordination of care.   Prior to initiating telepsychological services, Barbara Mcmahon was provided with an informed consent document, which included the development of a safety plan (i.e., an emergency contact and emergency resources) in the event of an emergency/crisis. Barbara Mcmahon expressed understanding of the rationale of the safety plan and provided consent for this provider to reach out to her emergency contact in the event of an emergency/crisis. Barbara Mcmahon returned the completed consent form prior to today's appointment. This provider verbally reviewed the consent form during today's appointment prior to proceeding with the appointment. Barbara Mcmahon verbally acknowledged understanding that she is ultimately responsible for understanding her insurance benefits as it relates to reimbursement of telepsychological and in-person services. This provider also reviewed confidentiality, as it relates to telepsychological services, as well as the rationale for telepsychological services. More specifically, this provider's clinic is limiting in-person visits due to COVID-19. Therapeutic services will resume to in-person appointments once deemed appropriate. Barbara Mcmahon expressed understanding regarding the rationale for telepsychological services. In addition, this provider explained the telepsychological services informed consent document would be considered an addendum to the initial consent document/service agreement. Barbara Mcmahon verbally consented to proceed.   Chief Complaint/HPI: Barbara Mcmahon was referred by [name] due to depression with anxiety. Per the note for the visit with [name] on March 18, 2019,  "Barbara Mcmahon has had issues with being judged for her weight by her family since growing up. She feels that she has a very dysfunctional relationship with food, self esteem, and self image. Barbara Mcmahon requests a referral to therapy. She is struggling with emotional eating and using food for comfort to the extent that  it is negatively impacting her health. She often snacks when she is not hungry. Barbara Mcmahon sometimes feels she is out of control and then feels guilty that she made poor food choices. She has been working on behavior modification techniques to help reduce her emotional eating and has been somewhat successful." During the initial appointment with [name] at [name] on October 30, 2018, Barbara Mcmahon reported experiencing the following: significant food cravings issues , snacking frequently in the evenings, frequently drinking liquids with calories, frequently making poor food choices, frequently eating larger portions than normal , struggling with emotional eating, waking up frquently in the middle of the night to eat and having problems with excessive hunger.   During today's appointment, Barbara Mcmahon reported a belief that her eating habits stem from childhood. Lately, Barbara Mcmahon feels she does not have control with weight and her eating habits, and described herself as "successful" in other domains of her life. Barbara Mcmahon was verbally administered a questionnaire assessing various behaviors related to emotional eating. Barbara Mcmahon endorsed the following: overeat when you are celebrating, experience food cravings on a regular basis, eat certain foods when you are anxious, stressed, depressed, or your feelings are hurt, use food to help you cope with emotional situations, find food is comforting to you, overeat when you are worried about something, overeat when you are alone, but eat much less when you are with other people and eat as a reward. Barbara Mcmahon shared the onset of emotional  eating was likely when she was in college, and believes it is because she was raised in a "very strict" household and in college she was able to make her own choices.  She shared she craves the following: sweets, such as chocolate. Barbara Mcmahon reported the frequency of emotional eating as "every day" prior to COVID-19. She denied engaging in binge eating, as well as mindless eating. Barbara Mcmahon denied a history of restricting food intake, purging and engagement in other compensatory strategies, and has never been diagnosed with an eating disorder. She also denied a history of treatment for emotional eating. Moreover, Barbara Mcmahon indicated stress triggers emotional eating, whereas increased awareness makes emotional eating better. She explained, "I have a child with Autism and I also have a very stressful job." Furthermore, Barbara Mcmahon denied other problems of concern.    Mental Status Examination:  Appearance: neat Behavior: cooperative Mood: euthymic Affect: mood congruent Speech: normal in rate, volume, and tone Eye Contact: appropriate Psychomotor Activity: appropriate Thought Process: linear, logical, and goal directed  Content/Perceptual Disturbances: denies suicidal and homicidal ideation, plan, and intent and no hallucinations, delusions, bizarre thinking or behavior reported or observed Orientation: time, person, place and purpose of appointment Cognition/Sensorium: memory, attention, language, and fund of knowledge intact  Insight: good Judgment: good  Family & Psychosocial History: Barbara Mcmahon reported she has been married for 15 years and they have two children (ages 69 and 2). 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 Additionally, Barbara Mcmahon shared her highest level of education obtained is a bachelor's degree. Currently, Barbara Mcmahon's social support system consists of her husband, best friend, Tree surgeon, and church. Moreover, Barbara Mcmahon stated she  resides with her husband, children, and dog.   Medical History:  Past Medical History:  Diagnosis Date   Abnormal Pap smear and cervical HPV (human papillomavirus)    Anxiety    Arthritis    BAck and sacral joint   Back pain    Colon polyps    Constipation  Gallbladder problem    Generalized anxiety disorder    IBS (irritable bowel syndrome)    Infertility, female    Joint pain    Multiple food allergies    Shellfish, cinnamon and yeast   Obesity    PCOS (polycystic ovarian syndrome)    Prediabetes    Pulmonary embolism (HCC)    Seizure disorder (HCC)    Vitamin D deficiency    Past Surgical History:  Procedure Laterality Date   CHOLECYSTECTOMY     COLPOSCOPY     DILATION AND CURETTAGE OF UTERUS     GYNECOLOGIC CRYOSURGERY     KNEE SURGERY  right   Current Outpatient Medications on File Prior to Visit  Medication Sig Dispense Refill   acetaminophen (TYLENOL) 650 MG CR tablet Take 1 tablet (650 mg total) by mouth every 8 (eight) hours as needed for pain. 90 tablet 3   cetirizine (ZYRTEC) 10 MG tablet Take 10 mg by mouth daily.     DULoxetine (CYMBALTA) 30 MG capsule Take 3 capsules (90 mg total) by mouth daily. [facility].MUST SCHEDULE/KEEP APPOINTMENT FOR REFILLS 90 capsule 0   ELIQUIS 5 MG TABS tablet TAKE 1 TABLET BY MOUTH TWICE A DAY 180 tablet 3   EPINEPHrine (EPI-PEN) 0.3 mg/0.3 mL SOAJ injection Inject 0.3 mLs (0.3 mg total) into the muscle once as needed (anaphylaxis). 1 Device 1   Insulin Pen Needle (BD PEN NEEDLE NANO 2ND GEN) 32G X 4 MM MISC 1 Package by Does not apply route 2 (two) times daily. 100 each 0   liraglutide (VICTOZA) 18 MG/3ML SOPN Inject 0.1 mLs (0.6 mg total) into the skin every morning. 1 pen 0   Melatonin 1 MG CAPS Take 1 capsule by mouth at bedtime.     Vitamin D, Ergocalciferol, (DRISDOL) 1.25 MG (50000 UT) CAPS capsule Take 1 capsule (50,000 Units total) by mouth every 7 (seven) days. 4 capsule 0    No current facility-administered medications on file prior to visit.   Takina denied a history of head injuries and loss of consciousness.   Mental Health History: Alveria endorsed a history of mental health treatment, including therapeutic services. She indicated she sought services at age 76 when her maternal grandmother was diagnosed with Alzheimer's Disease. She attended therapy for approximately 1 year. In addition, she attended individual therapy with her son's psychologist as it relates to his diagnosis of Autism. She stated it is ongoing as needed. Gessica denied a history of hospitalizations for psychiatric concerns, and has never met with a psychiatrist. Regarding psychotropic medications, Vanesha stated she is currently taking Cymbalta, which is prescribed by her PCP. She noted, "It is dual purpose" for mood and arthritis. Rilda was previously diagnosed Effexor, and she believes she was prescribed another medication prior to that. Fenna endorsed a family history of mental health related concerns. She explained her mother and sister are diagnosed with anorexia. Korianna denied a trauma history, including psychological, physical  and sexual abuse, as well as neglect.   Kayson described her typical mood as "pretty positive." Aside from concerns noted above and endorsed on the PHQ-9 and GAD-7, Norabelle reported worry related to work and her son. Jacklyne endorsed social alcohol use. She drinks "once a week" in the form of "a half of glass of wine." She denied tobacco use. She denied illicit substance use. Regarding caffeine intake, Charlot reported drinking unsweetened tea daily. Furthermore, Nechelle denied experiencing the following: hopelessness, obsessions and compulsions, hallucinations and delusions, paranoia, mania and  decreased motivation. She also denied history of and current suicidal ideation, plan, and intent; history of and current homicidal ideation, plan, and  intent; and history of and current engagement in self-harm.  The following strengths were reported by Jahari: positive, strong, caring, great mom, storyteller, and advocate for her son. The following strengths were observed by this provider: ability to express thoughts and feelings during the therapeutic session, ability to establish and benefit from a therapeutic relationship, ability to learn and practice coping skills, willingness to work toward established goal(s) with the clinic and ability to engage in reciprocal conversation.  Legal History: Kaliya denied a history of legal involvement.   Structured Assessment Results: The Patient Health Questionnaire-9 (PHQ-9) is a self-report measure that assesses symptoms and severity of depression over the course of the last two weeks. Ahniyah obtained a score of 1 suggesting minimal depression. Jhoselin finds the endorsed symptoms to be not difficult at all. Little interest or pleasure in doing things 0  Feeling down, depressed, or hopeless 0  Trouble falling or staying asleep, or sleeping too much 0  Feeling tired or having little energy 0  Poor appetite or overeating 1  Feeling bad about yourself --- or that you are a failure or have let yourself or your family down 0  Trouble concentrating on things, such as reading the newspaper or watching television 0  Moving or speaking so slowly that other people could have noticed? Or the opposite --- being so fidgety or restless that you have been moving around a lot more than usual 0  Thoughts that you would be better off dead or hurting yourself in some way 0  PHQ-9 Score 1    The Generalized Anxiety Disorder-7 (GAD-7) is a brief self-report measure that assesses symptoms of anxiety over the course of the last two weeks. Simonne obtained a score of 4 suggesting minimal anxiety. Daisie finds the endorsed symptoms to be not difficult at all. Feeling nervous, anxious, on edge 1  Not being able  to stop or control worrying 0  Worrying too much about different things 0  Trouble relaxing 3  Being so restless that it's hard to sit still 0  Becoming easily annoyed or irritable 0  Feeling afraid as if something awful might happen 0  GAD-7 Score 4   Interventions: A chart review was conducted prior to the clinical intake interview. The PHQ-9, and GAD-7 were verbally administered as well as a Mood and Food questionnaire to assess various behaviors related to emotional eating. Throughout session, empathic reflections and validation was provided. Continuing treatment with this provider was discussed and a treatment goal was established. Psychoeducation regarding emotional versus physical hunger was provided. Tosha was sent a handout via e-mail to utilize between now and the next appointment to increase awareness of hunger patterns and subsequent eating. Leean provided verbal consent during today's appointment for this provider to send the handout via e-mail.   Provisional DSM-5 Diagnosis: 311 (F32.8) Other Specified Depressive Disorder, Emotional Eating Behaviors  Plan: Aaleeyah appears able and willing to participate as evidenced by collaboration on a treatment goal, engagement in reciprocal conversation, and asking questions as needed for clarification. Due to the upcoming holiday and limited availability of appointments, Zanayah was offered an appointment in one week or alternatively in three weeks. Per Ginnette's request, the next appointment will be scheduled in three weeks, which will be via [employer]. The following treatment goal was established: decrease emotional eating. Once this provider's office resumes in-person appointments  and it is deemed appropriate, Cielle will be notified. For the aforementioned goal, Anyela can benefit from biweekly individual therapy sessions that are brief in duration for approximately four to six sessions. The treatment modality will be  individual therapeutic services, including an eclectic therapeutic approach utilizing techniques from Cognitive Behavioral Therapy, Patient Centered Therapy, Dialectical Behavior Therapy, Acceptance and Commitment Therapy, Interpersonal Therapy, and Cognitive Restructuring. Therapeutic approach will include various interventions as appropriate, such as validation, support, mindfulness, thought defusion, reframing, psychoeducation, values assessment, and role playing. This provider will regularly review the treatment plan and medical chart to keep informed of status changes. Terin expressed understanding and agreement with the initial treatment plan of care.

## 2019-03-31 ENCOUNTER — Ambulatory Visit (INDEPENDENT_AMBULATORY_CARE_PROVIDER_SITE_OTHER): Payer: BC Managed Care – PPO | Admitting: Psychology

## 2019-03-31 ENCOUNTER — Other Ambulatory Visit: Payer: Self-pay

## 2019-03-31 DIAGNOSIS — F3289 Other specified depressive episodes: Secondary | ICD-10-CM

## 2019-04-01 ENCOUNTER — Encounter (INDEPENDENT_AMBULATORY_CARE_PROVIDER_SITE_OTHER): Payer: Self-pay | Admitting: Family Medicine

## 2019-04-01 ENCOUNTER — Other Ambulatory Visit: Payer: Self-pay

## 2019-04-01 ENCOUNTER — Telehealth (INDEPENDENT_AMBULATORY_CARE_PROVIDER_SITE_OTHER): Payer: BC Managed Care – PPO | Admitting: Family Medicine

## 2019-04-01 DIAGNOSIS — E8881 Metabolic syndrome: Secondary | ICD-10-CM

## 2019-04-01 DIAGNOSIS — Z6841 Body Mass Index (BMI) 40.0 and over, adult: Secondary | ICD-10-CM | POA: Diagnosis not present

## 2019-04-01 DIAGNOSIS — K5901 Slow transit constipation: Secondary | ICD-10-CM | POA: Diagnosis not present

## 2019-04-01 MED ORDER — POLYETHYLENE GLYCOL 3350 17 GM/SCOOP PO POWD: 17.0000 g | Freq: Every day | ORAL | 0 refills | Status: AC

## 2019-04-02 NOTE — Progress Notes (Signed)
Office: [phone]  /  Fax: [phone] TeleHealth Visit:  [name] has verbally consented to this TeleHealth visit today. The patient is located at home, the provider is located at the [employer] [facility] office. The participants in this visit include the listed provider and patient. The visit was conducted today via [email or web address].  HPI:   Chief Complaint: OBESITY Barbara Mcmahon is here to discuss her progress with her obesity treatment plan. She is on the Category 4 plan and is following her eating plan approximately 90 % of the time. She states she is exercising 0 minutes 0 times per week. Barbara Mcmahon feels that she has done well maintaining her weight loss on her Category 4 plan. She notes that her hunger is controlled, but she is struggling with meal planning and prepping for dinner especially.  We were unable to weigh the patient today for this TeleHealth visit. She feels as if she has maintained weight since her last visit. She has lost 3 lbs since starting treatment with [name].  Insulin Resistance Barbara Mcmahon has a diagnosis of insulin resistance based on her elevated fasting insulin level >5. Although Barbara Mcmahon's blood glucose readings are still under good control, insulin resistance puts her at greater risk of metabolic syndrome and diabetes. She started Victoza at 0.9 mg, which is 5 clicks past 0.6 mg. She is working on diet and exercise to decrease risk of diabetes. [name] notes increased constipation, reduced polyphagia, but denies abdominal pain.  Constipation Barbara Mcmahon notes decreased BM frequency and abdominal bloating, which is worse since starting Victoza. She took Ducolax and it helped, but she only took it 1 time.  ASSESSMENT AND PLAN:  Insulin resistance  BMI 45.0-49.9, adult (HCC)  Constipation due to slow transit  Class 3 severe obesity with serious comorbidity and body mass index (BMI) of 45.0 to 49.9 in adult, unspecified obesity type ([place])  PLAN:  Insulin  Resistance [name] will continue to work on weight loss, exercise, and decreasing simple carbohydrates in her diet to help decrease the risk of diabetes. She was informed that eating too many simple carbohydrates or too many calories at one sitting increases the likelihood of GI side effects. Astella agreed to continue Victoza as prescribed and to not increase the dose until her constipation is controlled. Aarian agreed to follow up with [name] as directed to monitor her progress in 3 weeks.  Constipation Barbara Mcmahon was informed decrease bowel movement frequency is normal while losing weight, but stools should not be hard or painful. She was advised to increase her H20 intake and work on increasing her fiber intake. High fiber foods were discussed today. Barbara Mcmahon agreed to take OTC Miralax 17 grams per day as needed and to follow up at the agreed upon time.  I spent > than 50% of the 25 minute visit on counseling as documented in the note.  Obesity Barbara Mcmahon is currently in the action stage of change. As such, her goal is to continue with weight loss efforts. She has agreed to follow the Category 4 plan. [name] has been instructed to work up to a goal of 150 minutes of combined cardio and strengthening exercise per week for weight loss and overall health benefits. We discussed the following Behavioral Modification Strategies today: increasing vegetables, increasing fiber rich foods, increase H2O intake, and work on meal planning and easy cooking plans.  Barbara Mcmahon has agreed to follow up with our clinic in 3 weeks. She was informed of the importance of frequent follow up visits to maximize her  success with intensive lifestyle modifications for her multiple health conditions.  ALLERGIES: Allergies  Allergen Reactions  . Morphine And Related Other (See Comments), Hives and Rash    Big mood changes BEHAVIOR CHANGES   . Shellfish Allergy Hives, Swelling and Anaphylaxis    Throat closes  .  Codeine Hives and Itching    Pt says she can take percocet  . Cinnamon   . Robitussin A-C [Guaifenesin-Codeine] Itching  . Tramadol Itching  . Yeast-Related Products Hives    MEDICATIONS: Current Outpatient Medications on File Prior to Visit  Medication Sig Dispense Refill  . acetaminophen (TYLENOL) 650 MG CR tablet Take 1 tablet (650 mg total) by mouth every 8 (eight) hours as needed for pain. 90 tablet 3  . cetirizine (ZYRTEC) 10 MG tablet Take 10 mg by mouth daily.    . DULoxetine (CYMBALTA) 30 MG capsule Take 3 capsules (90 mg total) by mouth daily. [place].MUST SCHEDULE/KEEP APPOINTMENT FOR REFILLS 90 capsule 0  . ELIQUIS 5 MG TABS tablet TAKE 1 TABLET BY MOUTH TWICE A DAY 180 tablet 3  . EPINEPHrine (EPI-PEN) 0.3 mg/0.3 mL SOAJ injection Inject 0.3 mLs (0.3 mg total) into the muscle once as needed (anaphylaxis). 1 Device 1  . Insulin Pen Needle (BD PEN NEEDLE NANO 2ND GEN) 32G X 4 MM MISC 1 Package by Does not apply route 2 (two) times daily. 100 each 0  . liraglutide (VICTOZA) 18 MG/3ML SOPN Inject 0.1 mLs (0.6 mg total) into the skin every morning. 1 pen 0  . Melatonin 1 MG CAPS Take 1 capsule by mouth at bedtime.    . Vitamin D, Ergocalciferol, (DRISDOL) 1.25 MG (50000 UT) CAPS capsule Take 1 capsule (50,000 Units total) by mouth every 7 (seven) days. 4 capsule 0   No current facility-administered medications on file prior to visit.     PAST MEDICAL HISTORY: Past Medical History:  Diagnosis Date  . Abnormal Pap smear and cervical HPV (human papillomavirus)   . Anxiety   . Arthritis    BAck and sacral joint  . Back pain   . Colon polyps   . Constipation   . Gallbladder problem   . Generalized anxiety disorder   . IBS (irritable bowel syndrome)   . Infertility, female   . Joint pain   . Multiple food allergies    Shellfish, cinnamon and yeast  . Obesity   . PCOS (polycystic ovarian syndrome)   . Prediabetes   . Pulmonary embolism ([place])   . Seizure  disorder ([place])   . Vitamin D deficiency     PAST SURGICAL HISTORY: Past Surgical History:  Procedure Laterality Date  . CHOLECYSTECTOMY    . COLPOSCOPY    . DILATION AND CURETTAGE OF UTERUS    . GYNECOLOGIC CRYOSURGERY    . KNEE SURGERY  right    SOCIAL HISTORY: Social History   Tobacco Use  . Smoking status: Never Smoker  . Smokeless tobacco: Never Used  Substance Use Topics  . Alcohol use: No  . Drug use: No    FAMILY HISTORY: Family History  Problem Relation Age of Onset  . Hypertension Father   . Depression Father   . Anxiety disorder Father   . Sleep apnea Father   . Obesity Father   . Diabetes Paternal Grandmother   . Hypertension Paternal Grandmother   . Stroke Paternal Grandmother   . Diabetes Paternal Grandfather   . Hypertension Paternal Grandfather   . Stroke Paternal Grandfather   .  Hypertension Mother   . Depression Mother   . Anxiety disorder Mother   . Eating disorder Mother     ROS: Review of Systems  Gastrointestinal: Positive for constipation. Negative for abdominal pain.       Positive for abdominal bloating.  Endo/Heme/Allergies:       Positive for polyphagia.    PHYSICAL EXAM: Pt in no acute distress  RECENT LABS AND TESTS: BMET    Component Value Date/Time   NA 138 11/14/2018 0901   NA 140 10/30/2018 0953   NA 140 10/11/2017 1122   K 4.7 11/14/2018 0901   K 4.2 10/11/2017 1122   CL 105 11/14/2018 0901   CL 103 10/11/2017 1122   CO2 29 11/14/2018 0901   CO2 29 10/11/2017 1122   GLUCOSE 126 (H) 11/14/2018 0901   GLUCOSE 95 10/11/2017 1122   BUN 17 11/14/2018 0901   BUN 10 10/30/2018 0953   BUN 10 10/11/2017 1122   CREATININE 0.73 11/14/2018 0901   CREATININE 0.9 10/11/2017 1122   CALCIUM 9.5 11/14/2018 0901   CALCIUM 8.7 10/11/2017 1122   GFRNONAA >60 11/14/2018 0901   GFRNONAA >89 10/16/2012 1646   GFRAA >60 11/14/2018 0901   GFRAA >89 10/16/2012 1646   Lab Results  Component Value Date   HGBA1C 5.8 (H)  10/30/2018   Lab Results  Component Value Date   INSULIN 10.3 10/30/2018   CBC    Component Value Date/Time   WBC 6.5 11/14/2018 0901   WBC 12.2 (H) 12/04/2017 1103   RBC 4.28 11/14/2018 0901   HGB 12.5 11/14/2018 0901   HGB 12.6 10/30/2018 0953   HGB 13.4 10/11/2017 1122   HCT 39.0 11/14/2018 0901   HCT 38.0 10/30/2018 0953   HCT 40.9 10/11/2017 1122   PLT 298 11/14/2018 0901   PLT 291 10/11/2017 1122   MCV 91.1 11/14/2018 0901   MCV 88 10/30/2018 0953   MCV 92 10/11/2017 1122   MCH 29.2 11/14/2018 0901   MCHC 32.1 11/14/2018 0901   RDW 12.8 11/14/2018 0901   RDW 13.2 10/30/2018 0953   RDW 13.3 10/11/2017 1122   LYMPHSABS 2.1 11/14/2018 0901   LYMPHSABS 2.7 10/30/2018 0953   LYMPHSABS 2.8 10/11/2017 1122   MONOABS 0.5 11/14/2018 0901   EOSABS 0.1 11/14/2018 0901   EOSABS 0.1 10/30/2018 0953   EOSABS 0.0 10/11/2017 1122   BASOSABS 0.1 11/14/2018 0901   BASOSABS 0.1 10/30/2018 0953   BASOSABS 0.0 10/11/2017 1122   Iron/TIBC/Ferritin/ %Sat No results found for: IRON, TIBC, FERRITIN, IRONPCTSAT Lipid Panel     Component Value Date/Time   CHOL 182 10/30/2018 0953   TRIG 98 10/30/2018 0953   HDL 56 10/30/2018 0953   LDLCALC 106 (H) 10/30/2018 0953   Hepatic Function Panel     Component Value Date/Time   PROT 6.7 11/14/2018 0901   PROT 6.7 10/30/2018 0953   PROT 6.8 10/11/2017 1122   ALBUMIN 4.2 11/14/2018 0901   ALBUMIN 4.1 10/30/2018 0953   AST 18 11/14/2018 0901   ALT 18 11/14/2018 0901   ALT 36 10/11/2017 1122   ALKPHOS 85 11/14/2018 0901   ALKPHOS 83 10/11/2017 1122   BILITOT 0.4 11/14/2018 0901      Component Value Date/Time   TSH 2.280 10/30/2018 0953   TSH 1.351 01/13/2013 1446   Results for NADEGE, CARRIGER (MRN [id number]) as of 04/02/2019 10:15  Ref. Range 10/30/2018 09:53  Vitamin D, 25-Hydroxy Latest Ref Range: 30.0 - 100.0 ng/mL 19.6 (L)  IMarcille Blanco, CMA, am acting as transcriptionist for Starlyn Skeans, MD I have reviewed the  above documentation for accuracy and completeness, and I agree with the above. -Dennard Nip, MD

## 2019-04-09 ENCOUNTER — Encounter: Payer: Self-pay | Admitting: Family Medicine

## 2019-04-19 ENCOUNTER — Encounter (INDEPENDENT_AMBULATORY_CARE_PROVIDER_SITE_OTHER): Payer: Self-pay | Admitting: Family Medicine

## 2019-04-20 ENCOUNTER — Encounter (INDEPENDENT_AMBULATORY_CARE_PROVIDER_SITE_OTHER): Payer: Self-pay

## 2019-04-20 NOTE — Telephone Encounter (Signed)
FYI

## 2019-04-21 ENCOUNTER — Other Ambulatory Visit: Payer: Self-pay

## 2019-04-21 ENCOUNTER — Ambulatory Visit (INDEPENDENT_AMBULATORY_CARE_PROVIDER_SITE_OTHER): Payer: BC Managed Care – PPO | Admitting: Psychology

## 2019-04-21 DIAGNOSIS — F3289 Other specified depressive episodes: Secondary | ICD-10-CM

## 2019-04-21 NOTE — Progress Notes (Signed)
Office: [phone]  /  Fax: [phone]    Date: April 21, 2019    Appointment Start Time: Mcmahon:32pm Duration: 33 minutes Provider: Glennie Isle, Psy.D. Type of Session: Individual Therapy  Location of Patient: Home Location of Provider: Provider's Home Type of Contact: Telepsychological Visit via [employer] WebEx   Session Content: Barbara Mcmahon is Barbara 43 y.o. female presenting via [place] for Barbara follow-up appointment to address the previously established treatment goal of decreasing emotional eating. Today's appointment was Barbara telepsychological visit, as this provider's clinic is seeing Barbara limited number of patients for in-person visits due to COVID-19. Therapeutic services will resume to in-person appointments once deemed appropriate. Barbara Mcmahon expressed understanding regarding the rationale for telepsychological services, and provided verbal consent for today's appointment. Prior to proceeding with today's appointment, Barbara Mcmahon's physical location at the time of this appointment was obtained. Barbara Mcmahon reported she was at home and provided the address. In the event of technical difficulties, Barbara Mcmahon shared Barbara phone number she could be reached at. Barbara Mcmahon and this provider participated in today's telepsychological service. Also, Barbara Mcmahon denied anyone else being present in the room or on the WebEx appointment.  This provider conducted Barbara brief check-in and verbally administered the PHQ-9 and GAD-7. Barbara Mcmahon reported she was furloughed last week; therefore, she decided to paint her son's bedroom resulting in arthritic pain. The aforementioned contributed to decreased appetite. Associated thoughts and feelings were processed and she described feeling disappointed in herself because she did not eat congruent to her meal plan. Thus, this provider explored what she would have done as it relates to eating prior to starting with the clinic. Barbara Mcmahon identified that in the past Barbara similar  situation would  have resulted in emotional eating and her frequently eating Mcmahon. The change in her eating habits since starting with the clinic was reflected to Barbara Mcmahon. Additionally, psychoeducation regarding emotional hunger versus emotional eating was provided. Session then focused on Barbara Mcmahon reporting "feeling Mcmahon of control" with eating. This was processed further. She indicated Barbara desire to focus on what increases accountability for herself as it relates to her eating and exercise habits. Thus, today's appointment focused on values. Psychoeducation regarding values was provided, including how it differs from goals. She identified valuing health; therefore, she was encouraged to set short-term and long-term goals congruent to this value. Barbara Mcmahon was receptive to today's session as evidenced by openness to sharing, responsiveness to feedback, and willingness to explore values.  Mental Status Examination:  Appearance: neat Behavior: cooperative Mood: euthymic Affect: mood congruent Speech: normal in rate, volume, and tone Eye Contact: appropriate Psychomotor Activity: appropriate Thought Process: linear, logical, and goal directed  Content/Perceptual Disturbances: denies suicidal and homicidal ideation, plan, and intent and no hallucinations, delusions, bizarre thinking or behavior reported or observed Orientation: time, person, place and purpose of appointment Cognition/Sensorium: memory, attention, language, and fund of knowledge intact  Insight: good Judgment: good  Structured Assessment Results: The Patient Health Questionnaire-9 (PHQ-9) is Barbara self-report measure that assesses symptoms and severity of depression over the course of the last two weeks. Barbara Mcmahon suggesting minimal depression. Barbara Mcmahon finds the endorsed symptoms to be not difficult at all. Barbara Mcmahon 0  Feeling down, depressed, or hopeless 0  Trouble falling or staying asleep, or  sleeping too much 3  Feeling tired or having Barbara energy 0  Poor appetite or overeating 1  Feeling bad about yourself --- or that you are Barbara failure or have let yourself  or your family down 0  Trouble concentrating on Mcmahon, such as reading the newspaper or watching television 0  Moving or speaking so slowly that other people could have noticed? Or the opposite --- being so fidgety or restless that you have been moving around Barbara lot more than usual 0  Thoughts that you would be better off dead or hurting yourself in some way 0  PHQ-9 Score Mcmahon    The Generalized Anxiety Disorder-7 (GAD-7) is Barbara brief self-report measure that assesses symptoms of anxiety over the course of the last two weeks. Barbara Mcmahon obtained Barbara score of 3 suggesting minimal anxiety. Barbara Mcmahon finds the endorsed symptoms to be not difficult at all. Feeling nervous, anxious, on edge 0  Not being able to stop or control worrying 0  Worrying too much about different Mcmahon 0  Trouble relaxing 3  Being so restless that it's hard to sit still 0  Becoming easily annoyed or irritable 0  Feeling afraid as if something awful might happen 0  GAD-7 Score 3   Interventions:  Conducted Barbara brief chart review Verbal administration of PHQ-9 and GAD-7 for symptom monitoring Provided empathic reflections and validation Processed thoughts and feelings Psychoeducation provided regarding values Focused on rapport building Employed supportive psychotherapy interventions to facilitate reduced distress, and to improve coping skills with identified stressors Psychoeducation provided regarding emotional hunger versus emotional eating  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 Progress is limited, as Iyanla has just begun treatment with this  provider; however, she is receptive to the interaction and interventions and rapport is being established.   Plan: Kameria continues to appear able and willing to participate as evidenced by engagement in reciprocal conversation, and asking questions for clarification as appropriate. The next appointment will be scheduled in two weeks, which will be via [employer]. Once this provider's office resumes in-person appointments and it is deemed appropriate, Cia will be notified. The next session will focus on reviewing values and the introduction of triggers, as they were not discussed today based on Kayda's presenting concerns.

## 2019-04-22 ENCOUNTER — Other Ambulatory Visit: Payer: Self-pay

## 2019-04-22 ENCOUNTER — Encounter (INDEPENDENT_AMBULATORY_CARE_PROVIDER_SITE_OTHER): Payer: Self-pay | Admitting: Family Medicine

## 2019-04-22 ENCOUNTER — Telehealth (INDEPENDENT_AMBULATORY_CARE_PROVIDER_SITE_OTHER): Payer: BC Managed Care – PPO | Admitting: Family Medicine

## 2019-04-22 DIAGNOSIS — Z6841 Body Mass Index (BMI) 40.0 and over, adult: Secondary | ICD-10-CM

## 2019-04-22 DIAGNOSIS — R7303 Prediabetes: Secondary | ICD-10-CM | POA: Diagnosis not present

## 2019-04-22 DIAGNOSIS — F418 Other specified anxiety disorders: Secondary | ICD-10-CM

## 2019-04-22 DIAGNOSIS — E559 Vitamin D deficiency, unspecified: Secondary | ICD-10-CM

## 2019-04-22 MED ORDER — VITAMIN D (ERGOCALCIFEROL) 1.25 MG (50000 UNIT) PO CAPS: 50000.0000 [IU] | ORAL_CAPSULE | ORAL | 0 refills | Status: DC

## 2019-04-22 MED ORDER — VICTOZA 18 MG/3ML ~~LOC~~ SOPN: 1.2000 mg | PEN_INJECTOR | SUBCUTANEOUS | 0 refills | Status: DC

## 2019-04-23 NOTE — Progress Notes (Signed)
Office: [phone]  /  Fax: [phone] TeleHealth Visit:  Barbara Mcmahon has verbally consented to this TeleHealth visit today. The patient is located at home, the provider is located at the [employer] [facility] office. The participants in this visit include the listed provider and patient. The visit was conducted today via [employer].  HPI:   Chief Complaint: OBESITY Barbara Mcmahon is here to discuss her progress with her obesity treatment plan. She is on the Category 4 plan and is following her eating plan approximately 60% of the time. She states she is doing back stretches 30 minutes 7 times per week. Barbara Mcmahon feels she has maintained her weight since her last visit. She has had back pain and is trying to do back stretches as her exercise. She notes increased cravings and stress eating during this time.  We were unable to weigh the patient today for this TeleHealth visit. She feels as if she has maintained her weight since her last visit. She has lost 3 lbs since starting treatment with [name].  Vitamin D deficiency Barbara Mcmahon has a diagnosis of Vitamin D deficiency, which is not yet at goal. Her last Vitamin D level was reported to be 19.6 on 10/30/2018. She is currently stable on prescription Vit D and denies nausea, vomiting or muscle weakness.  Pre-Diabetes Barbara Mcmahon has a diagnosis of prediabetes based on her elevated Hgb A1c and was informed this puts her at greater risk of developing diabetes. She is doing well on Victoza and notes decreased polyphagia and decreased hypoglycemia. She is working on diet and exercise to decrease risk of diabetes.  Depression with emotional eating behaviors Barbara Mcmahon is struggling with emotional eating and using food for comfort to the extent that it is negatively impacting her health. She often snacks when she is not hungry. Barbara Mcmahon sometimes feels she is out of control and then feels guilty that she made poor food choices. She has been working on  behavior modification techniques to help reduce her emotional eating and has been somewhat successful. Barbara Mcmahon is on Cymbalta and is doing therapy with Dr. Mallie Mussel. She continues to struggle with emotional eating, especially in the p.m. and is frustrated this is still an issue. She shows no sign of suicidal or homicidal ideations.  Depression screen [facility] 2/9 01/19/2019 10/30/2018 10/14/2017 04/08/2017  Decreased Interest 0 2 0 -  Down, Depressed, Hopeless 0 1 0 0  PHQ - 2 Score 0 3 0 0  Altered sleeping - 1 - 0  Tired, decreased energy - 3 - 0  Change in appetite - 3 - 0  Feeling bad or failure about yourself  - 2 - 0  Trouble concentrating - 0 - 0  Moving slowly or fidgety/restless - 1 - 0  Suicidal thoughts - 0 - 0  PHQ-9 Score - 13 - 0  Difficult doing work/chores - Not difficult at all - -   ASSESSMENT AND PLAN:  Prediabetes - Plan: liraglutide (VICTOZA) 18 MG/3ML SOPN  Vitamin D deficiency - Plan: Vitamin D, Ergocalciferol, (DRISDOL) 1.25 MG (50000 UT) CAPS capsule  Depression with anxiety  Class 3 severe obesity with serious comorbidity and body mass index (BMI) of 45.0 to 49.9 in adult, unspecified obesity type (HCC)  PLAN:  Vitamin D Deficiency Barbara Mcmahon was informed that low Vitamin D levels contributes to fatigue and are associated with obesity, breast, and colon cancer. She agrees to continue to take prescription Vit D @ 50,000 IU every week #4 with 0 refills and will follow-up for  routine testing of Vitamin D, at least 2-3 times per year. She was informed of the risk of over-replacement of Vitamin D and agrees to not increase her dose unless she discusses this with [name] first. Barbara Mcmahon agrees to follow-up with our clinic in 2 weeks.  Pre-Diabetes Barbara Mcmahon will continue to work on weight loss, exercise, and decreasing simple carbohydrates in her diet to help decrease the risk of diabetes. We dicussed metformin including benefits and risks. She was informed that eating too many  simple carbohydrates or too many calories at one sitting increases the likelihood of GI side effects. Barbara Mcmahon was given a refill on her Victoza 1.2 mg #2 pens with 0 refills and agrees to follow-up with our clinic in 2 weeks.  Depression with Emotional Eating Behaviors We discussed behavior modification techniques today to help Barbara Mcmahon deal with her emotional eating and depression. We discussed CBT to help identify why this is happening and strategies to decrease emotional eating. She will continue Cymbalta and therapy with Dr. Mallie Mussel.  Obesity Barbara Mcmahon is currently in the action stage of change. As such, her goal is to continue with weight loss efforts. She has agreed to follow the Category 4 plan. Barbara Mcmahon has been instructed to work up to a goal of 150 minutes of combined cardio and strengthening exercise per week for weight loss and overall health benefits. We discussed the following Behavioral Modification Strategies today: increasing lean protein intake, work on meal planning and easy cooking plans, and emotional eating strategies.  Barbara Mcmahon has agreed to follow-up with our clinic in 2 weeks. She was informed of the importance of frequent follow-up visits to maximize her success with intensive lifestyle modifications for her multiple health conditions.  ALLERGIES: Allergies  Allergen Reactions   Morphine And Related Other (See Comments), Hives and Rash    Big mood changes BEHAVIOR CHANGES    Shellfish Allergy Hives, Swelling and Anaphylaxis    Throat closes   Codeine Hives and Itching    Pt says she can take percocet   Cinnamon    Robitussin A-C [Guaifenesin-Codeine] Itching   Tramadol Itching   Yeast-Related Products Hives    MEDICATIONS: Current Outpatient Medications on File Prior to Visit  Medication Sig Dispense Refill   acetaminophen (TYLENOL) 500 MG tablet Take 500 mg by mouth every 6 (six) hours as needed.     cetirizine (ZYRTEC) 10 MG tablet Take 10  mg by mouth daily.     DULoxetine (CYMBALTA) 30 MG capsule Take 3 capsules (90 mg total) by mouth daily. [place].MUST SCHEDULE/KEEP APPOINTMENT FOR REFILLS 90 capsule 0   ELIQUIS 5 MG TABS tablet TAKE 1 TABLET BY MOUTH TWICE A DAY 180 tablet 3   EPINEPHrine (EPI-PEN) 0.3 mg/0.3 mL SOAJ injection Inject 0.3 mLs (0.3 mg total) into the muscle once as needed (anaphylaxis). 1 Device 1   Insulin Pen Needle (BD PEN NEEDLE NANO 2ND GEN) 32G X 4 MM MISC 1 Package by Does not apply route 2 (two) times daily. 100 each 0   Melatonin 1 MG CAPS Take 1 capsule by mouth at bedtime.     polyethylene glycol powder (GLYCOLAX/MIRALAX) 17 GM/SCOOP powder Take 17 g by mouth daily for 30 doses. 578 g 0   No current facility-administered medications on file prior to visit.     PAST MEDICAL HISTORY: Past Medical History:  Diagnosis Date   Abnormal Pap smear and cervical HPV (human papillomavirus)    Anxiety    Arthritis    BAck  and sacral joint   Back pain    Colon polyps    Constipation    Gallbladder problem    Generalized anxiety disorder    IBS (irritable bowel syndrome)    Infertility, female    Joint pain    Multiple food allergies    Shellfish, cinnamon and yeast   Obesity    PCOS (polycystic ovarian syndrome)    Prediabetes    Pulmonary embolism (HCC)    Seizure disorder (HCC)    Vitamin D deficiency     PAST SURGICAL HISTORY: Past Surgical History:  Procedure Laterality Date   CHOLECYSTECTOMY     COLPOSCOPY     DILATION AND CURETTAGE OF UTERUS     GYNECOLOGIC CRYOSURGERY     KNEE SURGERY  right    SOCIAL HISTORY: Social History   Tobacco Use   Smoking status: Never Smoker   Smokeless tobacco: Never Used  Substance Use Topics   Alcohol use: No   Drug use: No    FAMILY HISTORY: Family History  Problem Relation Age of Onset   Hypertension Father    Depression Father    Anxiety disorder Father    Sleep apnea Father     Obesity Father    Diabetes Paternal Grandmother    Hypertension Paternal Grandmother    Stroke Paternal Grandmother    Diabetes Paternal Grandfather    Hypertension Paternal Grandfather    Stroke Paternal Grandfather    Hypertension Mother    Depression Mother    Anxiety disorder Mother    Eating disorder Mother    ROS: Review of Systems  Gastrointestinal: Negative for nausea and vomiting.  Musculoskeletal:       Negative for muscle weakness.  Endo/Heme/Allergies:       Positive for decreased polyphagia. Positive for decreased hypoglycemia.  Psychiatric/Behavioral: Positive for depression (emotional eating). Negative for suicidal ideas.       Negative for homicidal ideas.   PHYSICAL EXAM: Pt in no acute distress  RECENT LABS AND TESTS: BMET    Component Value Date/Time   NA 138 11/14/2018 0901   NA 140 10/30/2018 0953   NA 140 10/11/2017 1122   K 4.7 11/14/2018 0901   K 4.2 10/11/2017 1122   CL 105 11/14/2018 0901   CL 103 10/11/2017 1122   CO2 29 11/14/2018 0901   CO2 29 10/11/2017 1122   GLUCOSE 126 (H) 11/14/2018 0901   GLUCOSE 95 10/11/2017 1122   BUN 17 11/14/2018 0901   BUN 10 10/30/2018 0953   BUN 10 10/11/2017 1122   CREATININE 0.73 11/14/2018 0901   CREATININE 0.9 10/11/2017 1122   CALCIUM 9.5 11/14/2018 0901   CALCIUM 8.7 10/11/2017 1122   GFRNONAA >60 11/14/2018 0901   GFRNONAA >89 10/16/2012 1646   GFRAA >60 11/14/2018 0901   GFRAA >89 10/16/2012 1646   Lab Results  Component Value Date   HGBA1C 5.8 (H) 10/30/2018   Lab Results  Component Value Date   INSULIN 10.3 10/30/2018   CBC    Component Value Date/Time   WBC 6.5 11/14/2018 0901   WBC 12.2 (H) 12/04/2017 1103   RBC 4.28 11/14/2018 0901   HGB 12.5 11/14/2018 0901   HGB 12.6 10/30/2018 0953   HGB 13.4 10/11/2017 1122   HCT 39.0 11/14/2018 0901   HCT 38.0 10/30/2018 0953   HCT 40.9 10/11/2017 1122   PLT 298 11/14/2018 0901   PLT 291 10/11/2017 1122   MCV 91.1  11/14/2018 0901   MCV 88  10/30/2018 0953   MCV 92 10/11/2017 1122   MCH 29.2 11/14/2018 0901   MCHC 32.1 11/14/2018 0901   RDW 12.8 11/14/2018 0901   RDW 13.2 10/30/2018 0953   RDW 13.3 10/11/2017 1122   LYMPHSABS 2.1 11/14/2018 0901   LYMPHSABS 2.7 10/30/2018 0953   LYMPHSABS 2.8 10/11/2017 1122   MONOABS 0.5 11/14/2018 0901   EOSABS 0.1 11/14/2018 0901   EOSABS 0.1 10/30/2018 0953   EOSABS 0.0 10/11/2017 1122   BASOSABS 0.1 11/14/2018 0901   BASOSABS 0.1 10/30/2018 0953   BASOSABS 0.0 10/11/2017 1122   Iron/TIBC/Ferritin/ %Sat No results found for: IRON, TIBC, FERRITIN, IRONPCTSAT Lipid Panel     Component Value Date/Time   CHOL 182 10/30/2018 0953   TRIG 98 10/30/2018 0953   HDL 56 10/30/2018 0953   LDLCALC 106 (H) 10/30/2018 0953   Hepatic Function Panel     Component Value Date/Time   PROT 6.7 11/14/2018 0901   PROT 6.7 10/30/2018 0953   PROT 6.8 10/11/2017 1122   ALBUMIN 4.2 11/14/2018 0901   ALBUMIN 4.1 10/30/2018 0953   AST 18 11/14/2018 0901   ALT 18 11/14/2018 0901   ALT 36 10/11/2017 1122   ALKPHOS 85 11/14/2018 0901   ALKPHOS 83 10/11/2017 1122   BILITOT 0.4 11/14/2018 0901      Component Value Date/Time   TSH 2.280 10/30/2018 0953   TSH 1.351 01/13/2013 1446   Results for BINDU, DOCTER (MRN [id number]) as of 04/23/2019 08:58  Ref. Range 10/30/2018 09:53  Vitamin D, 25-Hydroxy Latest Ref Range: 30.0 - 100.0 ng/mL 19.6 (L)   I, Michaelene Song, am acting as Location manager for Dennard Nip, MD I have reviewed the above documentation for accuracy and completeness, and I agree with the above. -Dennard Nip, MD

## 2019-04-26 ENCOUNTER — Other Ambulatory Visit: Payer: Self-pay | Admitting: Family Medicine

## 2019-04-26 DIAGNOSIS — M47816 Spondylosis without myelopathy or radiculopathy, lumbar region: Secondary | ICD-10-CM

## 2019-04-28 ENCOUNTER — Encounter (INDEPENDENT_AMBULATORY_CARE_PROVIDER_SITE_OTHER): Payer: Self-pay | Admitting: Family Medicine

## 2019-04-29 NOTE — Telephone Encounter (Signed)
Please advise 

## 2019-05-02 ENCOUNTER — Encounter (INDEPENDENT_AMBULATORY_CARE_PROVIDER_SITE_OTHER): Payer: Self-pay | Admitting: Family Medicine

## 2019-05-04 NOTE — Telephone Encounter (Signed)
Please advise 

## 2019-05-05 ENCOUNTER — Encounter (INDEPENDENT_AMBULATORY_CARE_PROVIDER_SITE_OTHER): Payer: Self-pay | Admitting: Family Medicine

## 2019-05-05 NOTE — Progress Notes (Signed)
Office: [phone]  /  Fax: [phone]    Date: May 06, 2019   Appointment Start Time: 4:01pm Duration: 30 minutes Provider: Glennie Isle, Psy.D. Type of Session: Individual Therapy  Location of Patient: Home Location of Provider: Provider's Home Type of Contact: Telepsychological Visit via [employer]   Session Content: Barbara Mcmahon is a 43 y.o. female presenting via [employer] for a follow-up appointment to address the previously established treatment goal of decreasing emotional eating. Today's appointment was a telepsychological visit, as this provider's clinic is seeing a limited number of patients for in-person visits due to COVID-19. Therapeutic services will resume to in-person appointments once deemed appropriate. Barbara Mcmahon expressed understanding regarding the rationale for telepsychological services, and provided verbal consent for today's appointment. Prior to proceeding with today's appointment, Barbara Mcmahon's physical location at the time of this appointment was obtained. Barbara Mcmahon reported she was at home and provided the address. In the event of technical difficulties, Barbara Mcmahon shared a phone number she could be reached at. Barbara Mcmahon and this provider participated in today's telepsychological service. Also, Barbara Mcmahon denied anyone else being present in the room or on the WebEx appointment.  This provider conducted a brief check-in and verbally administered the PHQ-9 and GAD-7. Barbara Mcmahon stated an increase in water intake, which was a goal she set based on values previously identified. This was positively reinforced. Due to PCOS, she discussed fluctuations in her weight resulting in frustration. Regarding emotional eating, Barbara Mcmahon stated she experiences physical hunger during the day; however, she experiences emotional hunger after dinner. As such, psychoeducation regarding triggers for emotional eating was provided. Barbara Mcmahon was provided a handout, and encouraged to utilize the  handout between now and the next appointment to increase awareness of triggers and frequency. Barbara Mcmahon agreed. This provider also discussed behavioral strategies for specific triggers, such as placing the utensil down when conversing to avoid mindless eating. Barbara Mcmahon provided verbal consent during today's appointment for this provider to send the handout for triggers via e-mail. Barbara Mcmahon was receptive to today's session as evidenced by openness to sharing, responsiveness to feedback, and willingness to explore triggers for emotional eating.  Mental Status Examination:  Appearance: neat Behavior: cooperative Mood: euthymic Affect: mood congruent Speech: normal in rate, volume, and tone Eye Contact: appropriate Psychomotor Activity: appropriate Thought Process: linear, logical, and goal directed  Content/Perceptual Disturbances: denies suicidal and homicidal ideation, plan, and intent and no hallucinations, delusions, bizarre thinking or behavior reported or observed Orientation: time, person, place and purpose of appointment Cognition/Sensorium: memory, attention, language, and fund of knowledge intact  Insight: good Judgment: good  Structured Assessment Results: The Patient Health Questionnaire-9 (PHQ-9) is a self-report measure that assesses symptoms and severity of depression over the course of the last two weeks. Barbara Mcmahon obtained a score of 0. Little interest or pleasure in doing things 0  Feeling down, depressed, or hopeless 0  Trouble falling or staying asleep, or sleeping too much 0  Feeling tired or having little energy 0  Poor appetite or overeating 0  Feeling bad about yourself --- or that you are a failure or have let yourself or your family down 0  Trouble concentrating on things, such as reading the newspaper or watching television 0  Moving or speaking so slowly that other people could have noticed? Or the opposite --- being so fidgety or restless that you have been moving  around a lot more than usual 0  Thoughts that you would be better off dead or hurting yourself in some way 0  PHQ-9  Score 0    The Generalized Anxiety Disorder-7 (GAD-7) is a brief self-report measure that assesses symptoms of anxiety over the course of the last two weeks. Barbara Mcmahon obtained a score of 3 suggesting minimal anxiety. Barbara Mcmahon finds the endorsed symptoms to be not difficult at all. Feeling nervous, anxious, on edge 0  Not being able to stop or control worrying 0  Worrying too much about different things 0  Trouble relaxing 3  Being so restless that it's hard to sit still 0  Becoming easily annoyed or irritable 0  Feeling afraid as if something awful might happen 0  GAD-7 Score 3   Interventions:  Conducted a brief chart review Verbal administration of PHQ-9 and GAD-7 for symptom monitoring Provided empathic reflections and validation Psychoeducation provided regarding triggers for emotional eating Provided positive reinforcement Employed supportive psychotherapy interventions to facilitate reduced distress, and to improve coping skills with identified stressors  DSM-5 Diagnosis: 311 (F32.8) Other Specified Depressive Disorder, Emotional Eating Behaviors  Treatment Goal & Progress: During the initial appointment with this provider, the following treatment goal was established: decrease emotional eating. Barbara Mcmahon has demonstrated progress in her goal as evidenced by increased awareness of hunger patterns and exploration of triggers for emotional eating. She continues to demonstrate willingness to engage in learned skills and strategies.    Plan: Barbara Mcmahon continues to appear able and willing to participate as evidenced by engagement in reciprocal conversation, and asking questions for clarification as appropriate. The next appointment will be scheduled in two weeks, which will be via [employer]. Once this provider's office resumes in-person appointments and it is deemed  appropriate, Barbara Mcmahon will be notified. The next session will focus on reviewing triggers for emotional eating and the introduction of mindfulness.

## 2019-05-06 ENCOUNTER — Encounter (INDEPENDENT_AMBULATORY_CARE_PROVIDER_SITE_OTHER): Payer: Self-pay | Admitting: Family Medicine

## 2019-05-06 ENCOUNTER — Other Ambulatory Visit: Payer: Self-pay

## 2019-05-06 ENCOUNTER — Telehealth (INDEPENDENT_AMBULATORY_CARE_PROVIDER_SITE_OTHER): Payer: BC Managed Care – PPO | Admitting: Family Medicine

## 2019-05-06 ENCOUNTER — Ambulatory Visit (INDEPENDENT_AMBULATORY_CARE_PROVIDER_SITE_OTHER): Payer: BC Managed Care – PPO | Admitting: Psychology

## 2019-05-06 DIAGNOSIS — E8881 Metabolic syndrome: Secondary | ICD-10-CM

## 2019-05-06 DIAGNOSIS — Z6841 Body Mass Index (BMI) 40.0 and over, adult: Secondary | ICD-10-CM

## 2019-05-06 DIAGNOSIS — F3289 Other specified depressive episodes: Secondary | ICD-10-CM | POA: Diagnosis not present

## 2019-05-06 NOTE — Telephone Encounter (Signed)
Please advise 

## 2019-05-07 NOTE — Progress Notes (Signed)
Office: [phone]  /  Fax: [phone] TeleHealth Visit:  Barbara Mcmahon has verbally consented to this TeleHealth visit today. The patient is located at home, the provider is located at the [employer] [facility] office. The participants in this visit include the listed provider and patient and any and all parties involved. The visit was conducted today via [employer].  HPI:   Chief Complaint: OBESITY Barbara Mcmahon is here to discuss her progress with her obesity treatment plan. She is on the Category 4 plan and is following her eating plan approximately 90 % of the time. She states she is more active overall. She is doing cosmic yoga 20 minutes 3 times per week, and she is in the pool everyday with her kids. Barbara Mcmahon has gained 2 to 3 pounds since her last visit. She has had two heavy menstrual cycles, two weeks apart, and she feels her weight gain may be due to bloating. She is on Saxenda at 1.2 mg and she still notes between meal hunger. We were unable to weigh the patient today for this TeleHealth visit. She feels as if she has gained weight since her last visit. She has lost 3 lbs since starting treatment with [name].  Insulin Resistance Barbara Mcmahon has a diagnosis of insulin resistance based on her elevated fasting insulin level >5. Although Barbara Mcmahon's blood glucose readings are still under good control, insulin resistance puts her at greater risk of metabolic syndrome and diabetes. Shemicka is still struggling with polyphagia on Victoza at 1.2 mg. She denies nausea, vomiting or hypoglycemia. Tamyra continues to work on diet and exercise to decrease risk of diabetes.  ASSESSMENT AND PLAN:  Insulin resistance  Class 3 severe obesity with serious comorbidity and body mass index (BMI) of 45.0 to 49.9 in adult, unspecified obesity type ([place])  PLAN:  Insulin Resistance Barbara Mcmahon will continue to work on weight loss, exercise, and decreasing simple carbohydrates in her diet to help  decrease the risk of diabetes. We dicussed metformin including benefits and risks. She was informed that eating too many simple carbohydrates or too many calories at one sitting increases the likelihood of GI side effects. Dandrea agrees to increase Victoza to 1.5 mg (or 5 clicks past 1.2 mg), and we will continue to monitor. Barbara Mcmahon agrees to follow up with [name] as directed to monitor her progress.  I spent > than 50% of the 25 minute visit on counseling as documented in the note.  Obesity Taite is currently in the action stage of change. As such, her goal is to continue with weight loss efforts She has agreed to keep a food journal with 400 to 600 calories and 40+ grams of protein daily and follow the Category 4 plan Barbara Mcmahon has been instructed to work up to a goal of 150 minutes of combined cardio and strengthening exercise per week for weight loss and overall health benefits. We discussed the following Behavioral Modification Strategies today: increasing lean protein intake, decreasing simple carbohydrates, increasing vegetables and work on meal planning and easy cooking plans Barbara Mcmahon will increase Victoza to 1.5 mg (or 5 clicks past 1.2).  Barbara Mcmahon has agreed to follow up with our clinic in 2 weeks. She was informed of the importance of frequent follow up visits to maximize her success with intensive lifestyle modifications for her multiple health conditions.  ALLERGIES: Allergies  Allergen Reactions  . Morphine And Related Other (See Comments), Hives and Rash    Big mood changes BEHAVIOR CHANGES   . Shellfish Allergy Hives, Swelling  and Anaphylaxis    Throat closes  . Codeine Hives and Itching    Pt says she can take percocet  . Cinnamon   . Robitussin A-C [Guaifenesin-Codeine] Itching  . Tramadol Itching  . Yeast-Related Products Hives    MEDICATIONS: Current Outpatient Medications on File Prior to Visit  Medication Sig Dispense Refill  . acetaminophen (TYLENOL)  500 MG tablet Take 500 mg by mouth every 6 (six) hours as needed.    . cetirizine (ZYRTEC) 10 MG tablet Take 10 mg by mouth daily.    . DULoxetine (CYMBALTA) 30 MG capsule TAKE THREE CAPSULES BY MOUTH DAILY 90 capsule 0  . ELIQUIS 5 MG TABS tablet TAKE 1 TABLET BY MOUTH TWICE A DAY 180 tablet 3  . EPINEPHrine (EPI-PEN) 0.3 mg/0.3 mL SOAJ injection Inject 0.3 mLs (0.3 mg total) into the muscle once as needed (anaphylaxis). 1 Device 1  . Insulin Pen Needle (BD PEN NEEDLE NANO 2ND GEN) 32G X 4 MM MISC 1 Package by Does not apply route 2 (two) times daily. 100 each 0  . liraglutide (VICTOZA) 18 MG/3ML SOPN Inject 0.2 mLs (1.2 mg total) into the skin every morning. 2 pen 0  . Melatonin 1 MG CAPS Take 1 capsule by mouth at bedtime.    . Vitamin D, Ergocalciferol, (DRISDOL) 1.25 MG (50000 UT) CAPS capsule Take 1 capsule (50,000 Units total) by mouth every 7 (seven) days. 4 capsule 0   No current facility-administered medications on file prior to visit.     PAST MEDICAL HISTORY: Past Medical History:  Diagnosis Date  . Abnormal Pap smear and cervical HPV (human papillomavirus)   . Anxiety   . Arthritis    BAck and sacral joint  . Back pain   . Colon polyps   . Constipation   . Gallbladder problem   . Generalized anxiety disorder   . IBS (irritable bowel syndrome)   . Infertility, female   . Joint pain   . Multiple food allergies    Shellfish, cinnamon and yeast  . Obesity   . PCOS (polycystic ovarian syndrome)   . Prediabetes   . Pulmonary embolism ([place])   . Seizure disorder ([place])   . Vitamin D deficiency     PAST SURGICAL HISTORY: Past Surgical History:  Procedure Laterality Date  . CHOLECYSTECTOMY    . COLPOSCOPY    . DILATION AND CURETTAGE OF UTERUS    . GYNECOLOGIC CRYOSURGERY    . KNEE SURGERY  right    SOCIAL HISTORY: Social History   Tobacco Use  . Smoking status: Never Smoker  . Smokeless tobacco: Never Used  Substance Use Topics  . Alcohol use: No  . Drug use:  No    FAMILY HISTORY: Family History  Problem Relation Age of Onset  . Hypertension Father   . Depression Father   . Anxiety disorder Father   . Sleep apnea Father   . Obesity Father   . Diabetes Paternal Grandmother   . Hypertension Paternal Grandmother   . Stroke Paternal Grandmother   . Diabetes Paternal Grandfather   . Hypertension Paternal Grandfather   . Stroke Paternal Grandfather   . Hypertension Mother   . Depression Mother   . Anxiety disorder Mother   . Eating disorder Mother     ROS: Review of Systems  Constitutional: Negative for weight loss.  Gastrointestinal: Negative for nausea and vomiting.  Endo/Heme/Allergies:       Positive for polyphagia Negative for hypoglycemia    PHYSICAL  EXAM: Pt in no acute distress  RECENT LABS AND TESTS: BMET    Component Value Date/Time   NA 138 11/14/2018 0901   NA 140 10/30/2018 0953   NA 140 10/11/2017 1122   K 4.7 11/14/2018 0901   K 4.2 10/11/2017 1122   CL 105 11/14/2018 0901   CL 103 10/11/2017 1122   CO2 29 11/14/2018 0901   CO2 29 10/11/2017 1122   GLUCOSE 126 (H) 11/14/2018 0901   GLUCOSE 95 10/11/2017 1122   BUN 17 11/14/2018 0901   BUN 10 10/30/2018 0953   BUN 10 10/11/2017 1122   CREATININE 0.73 11/14/2018 0901   CREATININE 0.9 10/11/2017 1122   CALCIUM 9.5 11/14/2018 0901   CALCIUM 8.7 10/11/2017 1122   GFRNONAA >60 11/14/2018 0901   GFRNONAA >89 10/16/2012 1646   GFRAA >60 11/14/2018 0901   GFRAA >89 10/16/2012 1646   Lab Results  Component Value Date   HGBA1C 5.8 (H) 10/30/2018   Lab Results  Component Value Date   INSULIN 10.3 10/30/2018   CBC    Component Value Date/Time   WBC 6.5 11/14/2018 0901   WBC 12.2 (H) 12/04/2017 1103   RBC 4.28 11/14/2018 0901   HGB 12.5 11/14/2018 0901   HGB 12.6 10/30/2018 0953   HGB 13.4 10/11/2017 1122   HCT 39.0 11/14/2018 0901   HCT 38.0 10/30/2018 0953   HCT 40.9 10/11/2017 1122   PLT 298 11/14/2018 0901   PLT 291 10/11/2017 1122   MCV  91.1 11/14/2018 0901   MCV 88 10/30/2018 0953   MCV 92 10/11/2017 1122   MCH 29.2 11/14/2018 0901   MCHC 32.1 11/14/2018 0901   RDW 12.8 11/14/2018 0901   RDW 13.2 10/30/2018 0953   RDW 13.3 10/11/2017 1122   LYMPHSABS 2.1 11/14/2018 0901   LYMPHSABS 2.7 10/30/2018 0953   LYMPHSABS 2.8 10/11/2017 1122   MONOABS 0.5 11/14/2018 0901   EOSABS 0.1 11/14/2018 0901   EOSABS 0.1 10/30/2018 0953   EOSABS 0.0 10/11/2017 1122   BASOSABS 0.1 11/14/2018 0901   BASOSABS 0.1 10/30/2018 0953   BASOSABS 0.0 10/11/2017 1122   Iron/TIBC/Ferritin/ %Sat No results found for: IRON, TIBC, FERRITIN, IRONPCTSAT Lipid Panel     Component Value Date/Time   CHOL 182 10/30/2018 0953   TRIG 98 10/30/2018 0953   HDL 56 10/30/2018 0953   LDLCALC 106 (H) 10/30/2018 0953   Hepatic Function Panel     Component Value Date/Time   PROT 6.7 11/14/2018 0901   PROT 6.7 10/30/2018 0953   PROT 6.8 10/11/2017 1122   ALBUMIN 4.2 11/14/2018 0901   ALBUMIN 4.1 10/30/2018 0953   AST 18 11/14/2018 0901   ALT 18 11/14/2018 0901   ALT 36 10/11/2017 1122   ALKPHOS 85 11/14/2018 0901   ALKPHOS 83 10/11/2017 1122   BILITOT 0.4 11/14/2018 0901      Component Value Date/Time   TSH 2.280 10/30/2018 0953   TSH 1.351 01/13/2013 1446     Ref. Range 10/30/2018 09:53  Vitamin D, 25-Hydroxy Latest Ref Range: 30.0 - 100.0 ng/mL 19.6 (L)    I, Doreene Nest, am acting as Location manager for Dennard Nip, MD I have reviewed the above documentation for accuracy and completeness, and I agree with the above. -Dennard Nip, MD

## 2019-05-07 NOTE — Telephone Encounter (Signed)
Please advise 

## 2019-05-18 ENCOUNTER — Telehealth: Payer: Self-pay | Admitting: Family Medicine

## 2019-05-18 NOTE — Telephone Encounter (Signed)
Ok to put on virtual at the end of day tomorrow.

## 2019-05-18 NOTE — Telephone Encounter (Signed)
Patient states that she's not in a good place. She feels overwhelmed, has not been sleeping. Would like to have a virtual visit with Dr.Metheney ASAP. Needs maybe a change in medication or a bump up she states. Nothing open in the appropriate time slot. Offered to speak to a triage nurse and patient declined. Please advise where you would like me to work patient in.

## 2019-05-19 ENCOUNTER — Ambulatory Visit (INDEPENDENT_AMBULATORY_CARE_PROVIDER_SITE_OTHER): Payer: BC Managed Care – PPO | Admitting: Family Medicine

## 2019-05-19 ENCOUNTER — Encounter: Payer: Self-pay | Admitting: Family Medicine

## 2019-05-19 VITALS — Ht 65.0 in

## 2019-05-19 DIAGNOSIS — F419 Anxiety disorder, unspecified: Secondary | ICD-10-CM

## 2019-05-19 DIAGNOSIS — F418 Other specified anxiety disorders: Secondary | ICD-10-CM | POA: Diagnosis not present

## 2019-05-19 DIAGNOSIS — G4700 Insomnia, unspecified: Secondary | ICD-10-CM | POA: Diagnosis not present

## 2019-05-19 MED ORDER — CLONAZEPAM 0.5 MG PO TABS: 0.2500 mg | ORAL_TABLET | Freq: Every day | ORAL | 0 refills | Status: DC | PRN

## 2019-05-19 NOTE — Progress Notes (Signed)
Office: [phone]  /  Fax: [phone]    Date: May 21, 2019   Appointment Start Time: 10:00am Duration: 24 minutes Provider: Glennie Isle, Psy.D. Type of Session: Individual Therapy  Location of Patient: Home Location of Provider: Provider's Home Type of Contact: Telepsychological Visit via [employer]   Session Content: Barbara Mcmahon is a 43 y.o. female presenting via [place] for a follow-up appointment to address the previously established treatment goal of decreasing emotional eating. Today's appointment was a telepsychological visit, as this provider's clinic is seeing a limited number of patients for in-person visits due to COVID-19. Therapeutic services will resume to in-person appointments once deemed appropriate. Barbara Mcmahon expressed understanding regarding the rationale for telepsychological services, and provided verbal consent for today's appointment. Prior to proceeding with today's appointment, Barbara Mcmahon's physical location at the time of this appointment was obtained. Barbara Mcmahon reported she was at home and provided the address. In the event of technical difficulties, Barbara Mcmahon shared a phone number she could be reached at. Barbara Mcmahon and this provider participated in today's telepsychological service. Also, Barbara Mcmahon denied anyone else being on the WebEx appointment. She shared her 71 year old daughter was sleeping in the room, but noted she would likely not wake up during the duration of the appointment. Barbara Mcmahon also was observed wearing her headphones throughout the appointment.   This provider conducted a brief check-in and verbally administered the PHQ-9 and GAD-7. Barbara Mcmahon reported things are going well with her meal plan and changes were made to her dinner. More specifically, she was switched to journaling for dinner. Regarding emotional eating, Barbara Mcmahon stated, "Not really happening that much." However, she shared ongoing stress secondary to her son's therapy and  school starting. Barbara Mcmahon indicated she started having panic attacks and she requested medication from her PCP. She described the frequency of panic attacks as "several" times throughout the day and she noted engaging in breathing exercises to assist with coping.This was positively reinforced. To further assist with coping, psychoeducation regarding mindfulness was provided. A handout was provided to Barbara Mcmahon with further information regarding mindfulness, including exercises. This provider also explained the benefit of mindfulness as it relates to emotional eating. [name] was encouraged to engage in the provided exercises between now and the next appointment with this provider. [name] agreed. She was led through an exercise involving her senses during today's appointment. Following the exercise, she shared, "I like it." [name] provided verbal consent during today's appointment for this provider to send the handout about mindfulness via e-mail. Overall, Barbara Mcmahon was receptive to today's session as evidenced by openness to sharing, responsiveness to feedback, and willingness to engage in mindfulness exercises.  Mental Status Examination:  Appearance: neat Behavior: cooperative Mood: euthymic Affect: mood congruent Speech: normal in rate, volume, and tone Eye Contact: appropriate Psychomotor Activity: appropriate Thought Process: linear, logical, and goal directed  Content/Perceptual Disturbances: no hallucinations, delusions, bizarre thinking or behavior reported or observed and no evidence of suicidal and homicidal ideation, plan, and intent Orientation: time, person, place and purpose of appointment Cognition/Sensorium: memory, attention, language, and fund of knowledge intact  Insight: good Judgment: good  Structured Assessment Results: The Patient Health Questionnaire-9 (PHQ-9) is a self-report measure that assesses symptoms and severity of depression over the course of the last two  weeks. Barbara Mcmahon obtained a score of 7 suggesting mild depression. Barbara Mcmahon finds the endorsed symptoms to be somewhat difficult. Little interest or pleasure in doing things 0  Feeling down, depressed, or hopeless 0  Trouble falling or staying asleep, or sleeping  too much 3  Feeling tired or having little energy 3  Poor appetite or overeating 1  Feeling bad about yourself --- or that you are a failure or have let yourself or your family down 0  Trouble concentrating on things, such as reading the newspaper or watching television 0  Moving or speaking so slowly that other people could have noticed? Or the opposite --- being so fidgety or restless that you have been moving around a lot more than usual 0  Thoughts that you would be better off dead or hurting yourself in some way 0  PHQ-9 Score 7    The Generalized Anxiety Disorder-7 (GAD-7) is a brief self-report measure that assesses symptoms of anxiety over the course of the last two weeks. Barbara Mcmahon obtained a score of 4 suggesting minimal anxiety. Barbara Mcmahon finds the endorsed symptoms to be not difficult at all. Feeling nervous, anxious, on edge 1  Not being able to stop or control worrying 0  Worrying too much about different things 0  Trouble relaxing 3  Being so restless that it's hard to sit still 0  Becoming easily annoyed or irritable 0  Feeling afraid as if something awful might happen 0  GAD-7 Score 4   Interventions:  Conducted a brief chart review Verbal administration of PHQ-9 and GAD-7 for symptom monitoring Provided empathic reflections and validation Psychoeducation provided regarding mindfulness Engaged patient in a mindfulness exercise Employed supportive psychotherapy interventions to facilitate reduced distress, and to improve coping skills with identified stressors Employed acceptance and commitment interventions to emphasize mindfulness and acceptance without struggle  DSM-5 Diagnosis: 311 (F32.8) Other Specified  Depressive Disorder, Emotional Eating Behaviors  Treatment Goal & Progress: During the initial appointment with this provider, the following treatment goal was established: decrease emotional eating. Aijalon has demonstrated progress in her goal as evidenced by increased awareness of hunger patterns and triggers for emotional eating. She continues to demonstrate willingness to engage in learned skills.   Plan: Britanni continues to appear able and willing to participate as evidenced by engagement in reciprocal conversation, and asking questions for clarification as appropriate. The next appointment will be scheduled in three weeks, which will be via [employer]. The next session will focus further on mindfulness and termination planning.

## 2019-05-19 NOTE — Telephone Encounter (Signed)
Appointment has been made. No further questions at this time.  

## 2019-05-19 NOTE — Progress Notes (Signed)
Virtual Visit via Video Note  I connected with Barbara Mcmahon on 05/21/19 at  4:20 PM EDT by a video enabled telemedicine application and verified that I am speaking with the correct person using two identifiers.   I discussed the limitations of evaluation and management by telemedicine and the availability of in person appointments. The patient expressed understanding and agreed to proceed.    Established Patient Office Visit  Subjective:  Patient ID: Barbara Mcmahon, female    DOB: 03/03/1976  Age: 43 y.o. MRN: [id number]  CC:  Chief Complaint  Patient presents with  . Insomnia    HPI Barbara Mcmahon presents for increased sleep and not sleeping well. She is a very busy mom with a son with special needs and works full time. She has a lot gong on and felt she was doing OK until about a week or two ago when she realized school was starting back with her son and she is on the fundraising com for the PTA and realized how much she has going on. She just feels very stressed and anxious and when she is alone she starts to feel panicky. She feels there is too much on her plate.  She is already on 90 mg of Duloxetine.  She is also not sleeping well.   Pt stated that every day around 3 AM she wakes up from her sleep. She stated that if she has a moment to herself she has a panic attack. She feels overwhelmed.   Past Medical History:  Diagnosis Date  . Abnormal Pap smear and cervical HPV (human papillomavirus)   . Anxiety   . Arthritis    BAck and sacral joint  . Back pain   . Colon polyps   . Constipation   . Gallbladder problem   . Generalized anxiety disorder   . IBS (irritable bowel syndrome)   . Infertility, female   . Joint pain   . Multiple food allergies    Shellfish, cinnamon and yeast  . Obesity   . PCOS (polycystic ovarian syndrome)   . Prediabetes   . Pulmonary embolism ([employer])   . Seizure disorder ([place])   . Vitamin D deficiency     Past Surgical History:   Procedure Laterality Date  . CHOLECYSTECTOMY    . COLPOSCOPY    . DILATION AND CURETTAGE OF UTERUS    . GYNECOLOGIC CRYOSURGERY    . KNEE SURGERY  right    Family History  Problem Relation Age of Onset  . Hypertension Father   . Depression Father   . Anxiety disorder Father   . Sleep apnea Father   . Obesity Father   . Diabetes Paternal Grandmother   . Hypertension Paternal Grandmother   . Stroke Paternal Grandmother   . Diabetes Paternal Grandfather   . Hypertension Paternal Grandfather   . Stroke Paternal Grandfather   . Hypertension Mother   . Depression Mother   . Anxiety disorder Mother   . Eating disorder Mother     Social History   Socioeconomic History  . Marital status: Married    Spouse name: Laynee Lockamy  . Number of children: 2  . Years of education: Not on file  . Highest education level: Not on file  Occupational History  . Occupation: Art gallery manager  Social Needs  . Financial resource strain: Not on file  . Food insecurity    Worry: Not on file    Inability: Not on file  . Transportation needs  Medical: Not on file    Non-medical: Not on file  Tobacco Use  . Smoking status: Never Smoker  . Smokeless tobacco: Never Used  Substance and Sexual Activity  . Alcohol use: No  . Drug use: No  . Sexual activity: Not Currently  Lifestyle  . Physical activity    Days per week: Not on file    Minutes per session: Not on file  . Stress: Not on file  Relationships  . Social Herbalist on phone: Not on file    Gets together: Not on file    Attends religious service: Not on file    Active member of club or organization: Not on file    Attends meetings of clubs or organizations: Not on file    Relationship status: Not on file  . Intimate partner violence    Fear of current or ex partner: Not on file    Emotionally abused: Not on file    Physically abused: Not on file    Forced sexual activity: Not on file  Other  Topics Concern  . Not on file  Social History Narrative  . Not on file    Outpatient Medications Prior to Visit  Medication Sig Dispense Refill  . acetaminophen (TYLENOL) 500 MG tablet Take 500 mg by mouth every 6 (six) hours as needed.    . cetirizine (ZYRTEC) 10 MG tablet Take 10 mg by mouth daily.    . DULoxetine (CYMBALTA) 30 MG capsule TAKE THREE CAPSULES BY MOUTH DAILY 90 capsule 0  . ELIQUIS 5 MG TABS tablet TAKE 1 TABLET BY MOUTH TWICE A DAY 180 tablet 3  . EPINEPHrine (EPI-PEN) 0.3 mg/0.3 mL SOAJ injection Inject 0.3 mLs (0.3 mg total) into the muscle once as needed (anaphylaxis). 1 Device 1  . Insulin Pen Needle (BD PEN NEEDLE NANO 2ND GEN) 32G X 4 MM MISC 1 Package by Does not apply route 2 (two) times daily. 100 each 0  . liraglutide (VICTOZA) 18 MG/3ML SOPN Inject 0.2 mLs (1.2 mg total) into the skin every morning. 2 pen 0  . Melatonin 1 MG CAPS Take 1 capsule by mouth at bedtime.    . Multiple Vitamin (MULTI-VITAMIN) tablet Take by mouth.    Marland Kitchen olopatadine (PATANOL) 0.1 % ophthalmic solution     . Vitamin D, Ergocalciferol, (DRISDOL) 1.25 MG (50000 UT) CAPS capsule Take 1 capsule (50,000 Units total) by mouth every 7 (seven) days. 4 capsule 0   No facility-administered medications prior to visit.     Allergies  Allergen Reactions  . Morphine And Related Other (See Comments), Hives and Rash    Big mood changes BEHAVIOR CHANGES   . Shellfish Allergy Hives, Swelling and Anaphylaxis    Throat closes  . Codeine Hives and Itching    Pt says she can take percocet  . Clonazepam Itching  . Cinnamon   . Robitussin A-C [Guaifenesin-Codeine] Itching  . Tramadol Itching  . Yeast-Related Products Hives    ROS Review of Systems    Objective:    Physical Exam  Constitutional: She is oriented to person, place, and time. She appears well-developed and well-nourished.  Well groomed.   Pulmonary/Chest: Effort normal.  Neurological: She is alert and oriented to person,  place, and time.    Ht 5\' 5"  (1.651 m)   BMI 48.09 kg/m  Wt Readings from Last 3 Encounters:  12/22/18 289 lb (131.1 kg)  12/09/18 295 lb (133.8 kg)  12/01/18 290 lb (131.5  kg)     There are no preventive care reminders to display for this patient.  There are no preventive care reminders to display for this patient.  Lab Results  Component Value Date   TSH 2.280 10/30/2018   Lab Results  Component Value Date   WBC 6.5 11/14/2018   HGB 12.5 11/14/2018   HCT 39.0 11/14/2018   MCV 91.1 11/14/2018   PLT 298 11/14/2018   Lab Results  Component Value Date   NA 138 11/14/2018   K 4.7 11/14/2018   CO2 29 11/14/2018   GLUCOSE 126 (H) 11/14/2018   BUN 17 11/14/2018   CREATININE 0.73 11/14/2018   BILITOT 0.4 11/14/2018   ALKPHOS 85 11/14/2018   AST 18 11/14/2018   ALT 18 11/14/2018   PROT 6.7 11/14/2018   ALBUMIN 4.2 11/14/2018   CALCIUM 9.5 11/14/2018   ANIONGAP 4 (L) 11/14/2018   Lab Results  Component Value Date   CHOL 182 10/30/2018   Lab Results  Component Value Date   HDL 56 10/30/2018   Lab Results  Component Value Date   LDLCALC 106 (H) 10/30/2018   Lab Results  Component Value Date   TRIG 98 10/30/2018   No results found for: CHOLHDL Lab Results  Component Value Date   HGBA1C 5.8 (H) 10/30/2018      Assessment & Plan:   Problem List Items Addressed This Visit      Other   Anxiety    Discussed options. The Cymbalta is really helpful for her chronic neck pain so will keep that even though not the most effective for anxiety. We discussed trying small dose of clonazepam for rescue only. Warned about potential for dependency.  F/U in 4 weeks.  Discussed working with a therapist and counselor to help her prioritize and may need to consider stepping back form some responsibilities this year.        Relevant Orders   Ambulatory referral to [facility]    Other Visit Diagnoses    Depression with anxiety    -  Primary   Relevant Orders    Ambulatory referral to Behavioral Health   Insomnia, unspecified type       Relevant Orders   Ambulatory referral to Behavioral Health     Insomnia - work on lowering stress levels and sleep should improve.    Meds ordered this encounter  Medications  . DISCONTD: clonazePAM (KLONOPIN) 0.5 MG tablet    Sig: Take 0.5-1 tablets (0.25-0.5 mg total) by mouth daily as needed for anxiety.    Dispense:  15 tablet    Refill:  0    Follow-up: No follow-ups on file.    Beatrice Lecher, MD   I discussed the assessment and treatment plan with the patient. The patient was provided an opportunity to ask questions and all were answered. The patient agreed with the plan and demonstrated an understanding of the instructions.   The patient was advised to call back or seek an in-person evaluation if the symptoms worsen or if the condition fails to improve as anticipated.    Beatrice Lecher, MD

## 2019-05-20 ENCOUNTER — Encounter: Payer: Self-pay | Admitting: Family Medicine

## 2019-05-20 ENCOUNTER — Telehealth: Payer: Self-pay

## 2019-05-20 MED ORDER — HYDROXYZINE HCL 25 MG PO TABS: 25.0000 mg | ORAL_TABLET | Freq: Two times a day (BID) | ORAL | 0 refills | Status: DC | PRN

## 2019-05-20 NOTE — Telephone Encounter (Signed)
Patient advised. Updated her allergies.

## 2019-05-20 NOTE — Telephone Encounter (Signed)
Barbara Mcmahon called and states she started itching about 2 hours after taking the Clonazepam. Denies hives, shortness of breath or mouth swelling. Please advise.

## 2019-05-20 NOTE — Telephone Encounter (Signed)
Ok, please stop the med and and add to her intolerance list and I will send in something new.

## 2019-05-20 NOTE — Assessment & Plan Note (Signed)
Discussed options. The Cymbalta is really helpful for her chronic neck pain so will keep that even though not the most effective for anxiety. We discussed trying small dose of clonazepam for rescue only. Warned about potential for dependency.  F/U in 4 weeks.  Discussed working with a therapist and counselor to help her prioritize and may need to consider stepping back form some responsibilities this year.

## 2019-05-21 ENCOUNTER — Encounter (INDEPENDENT_AMBULATORY_CARE_PROVIDER_SITE_OTHER): Payer: Self-pay | Admitting: Family Medicine

## 2019-05-21 ENCOUNTER — Telehealth (INDEPENDENT_AMBULATORY_CARE_PROVIDER_SITE_OTHER): Payer: BC Managed Care – PPO | Admitting: Family Medicine

## 2019-05-21 ENCOUNTER — Ambulatory Visit (INDEPENDENT_AMBULATORY_CARE_PROVIDER_SITE_OTHER): Payer: BC Managed Care – PPO | Admitting: Psychology

## 2019-05-21 ENCOUNTER — Other Ambulatory Visit: Payer: Self-pay

## 2019-05-21 DIAGNOSIS — F3289 Other specified depressive episodes: Secondary | ICD-10-CM | POA: Diagnosis not present

## 2019-05-21 DIAGNOSIS — Z6841 Body Mass Index (BMI) 40.0 and over, adult: Secondary | ICD-10-CM

## 2019-05-21 DIAGNOSIS — E559 Vitamin D deficiency, unspecified: Secondary | ICD-10-CM

## 2019-05-21 DIAGNOSIS — F419 Anxiety disorder, unspecified: Secondary | ICD-10-CM | POA: Diagnosis not present

## 2019-05-21 MED ORDER — VITAMIN D (ERGOCALCIFEROL) 1.25 MG (50000 UNIT) PO CAPS: 50000.0000 [IU] | ORAL_CAPSULE | ORAL | 0 refills | Status: DC

## 2019-05-22 DIAGNOSIS — D225 Melanocytic nevi of trunk: Secondary | ICD-10-CM | POA: Diagnosis not present

## 2019-05-22 DIAGNOSIS — D485 Neoplasm of uncertain behavior of skin: Secondary | ICD-10-CM | POA: Diagnosis not present

## 2019-05-22 DIAGNOSIS — Z808 Family history of malignant neoplasm of other organs or systems: Secondary | ICD-10-CM | POA: Diagnosis not present

## 2019-05-27 NOTE — Progress Notes (Signed)
Office: [phone]  /  Fax: [phone] TeleHealth Visit:  Barbara Mcmahon has verbally consented to this TeleHealth visit today. The patient is located at home, the provider is located at the [facility] office. The participants in this visit include the listed provider and patient. The visit was conducted today via [email or web address].  HPI:   Chief Complaint: OBESITY Barbara Mcmahon is here to discuss her progress with her obesity treatment plan. She is on the keep a food journal with 400-600 calories and 40+ grams of protein at supper daily and follow the Category 4 plan and is following her eating plan approximately 90 % of the time. She states she is exercising 0 minutes 0 times per week. Barbara Mcmahon was changed to a journaling plan for dinner and is doing very well with the freedom. She notes her clothes are fitting looser even though the scale says she has maintained. Her hunger is controlled and she is feeling more satisfied.  We were unable to weigh the patient today for this TeleHealth visit. She feels as if she has lost weight since her last visit. She has lost 3 lbs since starting treatment with [name].  Vitamin D Deficiency Barbara Mcmahon has a diagnosis of vitamin D deficiency. She is currently taking prescription Vit D, but level is not yet at goal. She denies nausea, vomiting or muscle weakness.  Anxiety Barbara Mcmahon discussed her anxiety with her primary care physician recently. She is on Cymbalta and Clonazepam as needed. She feels she is doing better overall. She denies suicidal ideas or homicidal ideas.  ASSESSMENT AND PLAN:  Vitamin D deficiency - Plan: Vitamin D, Ergocalciferol, (DRISDOL) 1.25 MG (50000 UT) CAPS capsule  Anxiety  Class 3 severe obesity with serious comorbidity and body mass index (BMI) of 45.0 to 49.9 in adult, unspecified obesity type ([place])  PLAN:  Vitamin D Deficiency Barbara Mcmahon was informed that low vitamin D levels contributes to fatigue and are  associated with obesity, breast, and colon cancer. Starlynn agrees to continue taking prescription Vit D 50,000 IU every week #4 and we will refill for 1 month. She will follow up for routine testing of vitamin D, at least 2-3 times per year. She was informed of the risk of over-replacement of vitamin D and agrees to not increase her dose unless she discusses this with [name] first. Barbara Mcmahon agrees to follow up with our clinic in 3 weeks.  Anxiety Barbara Mcmahon agrees to continue her medications as is and will continue to monitor. Barbara Mcmahon agrees to follow up with our clinic in 3 weeks.  Obesity Barbara Mcmahon is currently in the action stage of change. As such, her goal is to continue with weight loss efforts She has agreed to keep a food journal with 400-600 calories and 40+ grams of protein at supper daily and follow the Category Barbara Mcmahon has been instructed to work up to a goal of 150 minutes of combined cardio and strengthening exercise per week for weight loss and overall health benefits. We discussed the following Behavioral Modification Strategies today: increasing lean protein intake, decreasing simple carbohydrates, increasing vegetables, and keep a strict food journal   Barbara Mcmahon has agreed to follow up with our clinic in 3 weeks. She was informed of the importance of frequent follow up visits to maximize her success with intensive lifestyle modifications for her multiple health conditions.  ALLERGIES: Allergies  Allergen Reactions  . Morphine And Related Other (See Comments), Hives and Rash    Big mood changes BEHAVIOR CHANGES   .  Shellfish Allergy Hives, Swelling and Anaphylaxis    Throat closes  . Codeine Hives and Itching    Pt says she can take percocet  . Clonazepam Itching  . Cinnamon   . Robitussin A-C [Guaifenesin-Codeine] Itching  . Tramadol Itching  . Yeast-Related Products Hives    MEDICATIONS: Current Outpatient Medications on File Prior to Visit  Medication  Sig Dispense Refill  . acetaminophen (TYLENOL) 500 MG tablet Take 500 mg by mouth every 6 (six) hours as needed.    . cetirizine (ZYRTEC) 10 MG tablet Take 10 mg by mouth daily.    . DULoxetine (CYMBALTA) 30 MG capsule TAKE THREE CAPSULES BY MOUTH DAILY 90 capsule 0  . ELIQUIS 5 MG TABS tablet TAKE 1 TABLET BY MOUTH TWICE A DAY 180 tablet 3  . EPINEPHrine (EPI-PEN) 0.3 mg/0.3 mL SOAJ injection Inject 0.3 mLs (0.3 mg total) into the muscle once as needed (anaphylaxis). 1 Device 1  . hydrOXYzine (ATARAX/VISTARIL) 25 MG tablet Take 1-2 tablets (25-50 mg total) by mouth 2 (two) times daily as needed. 30 tablet 0  . Insulin Pen Needle (BD PEN NEEDLE NANO 2ND GEN) 32G X 4 MM MISC 1 Package by Does not apply route 2 (two) times daily. 100 each 0  . liraglutide (VICTOZA) 18 MG/3ML SOPN Inject 0.2 mLs (1.2 mg total) into the skin every morning. 2 pen 0  . Melatonin 1 MG CAPS Take 1 capsule by mouth at bedtime.    . Multiple Vitamin (MULTI-VITAMIN) tablet Take by mouth.    Marland Kitchen olopatadine (PATANOL) 0.1 % ophthalmic solution      No current facility-administered medications on file prior to visit.     PAST MEDICAL HISTORY: Past Medical History:  Diagnosis Date  . Abnormal Pap smear and cervical HPV (human papillomavirus)   . Anxiety   . Arthritis    BAck and sacral joint  . Back pain   . Colon polyps   . Constipation   . Gallbladder problem   . Generalized anxiety disorder   . IBS (irritable bowel syndrome)   . Infertility, female   . Joint pain   . Multiple food allergies    Shellfish, cinnamon and yeast  . Obesity   . PCOS (polycystic ovarian syndrome)   . Prediabetes   . Pulmonary embolism ([place])   . Seizure disorder ([place])   . Vitamin D deficiency     PAST SURGICAL HISTORY: Past Surgical History:  Procedure Laterality Date  . CHOLECYSTECTOMY    . COLPOSCOPY    . DILATION AND CURETTAGE OF UTERUS    . GYNECOLOGIC CRYOSURGERY    . KNEE SURGERY  right    SOCIAL HISTORY: Social  History   Tobacco Use  . Smoking status: Never Smoker  . Smokeless tobacco: Never Used  Substance Use Topics  . Alcohol use: No  . Drug use: No    FAMILY HISTORY: Family History  Problem Relation Age of Onset  . Hypertension Father   . Depression Father   . Anxiety disorder Father   . Sleep apnea Father   . Obesity Father   . Diabetes Paternal Grandmother   . Hypertension Paternal Grandmother   . Stroke Paternal Grandmother   . Diabetes Paternal Grandfather   . Hypertension Paternal Grandfather   . Stroke Paternal Grandfather   . Hypertension Mother   . Depression Mother   . Anxiety disorder Mother   . Eating disorder Mother     ROS: Review of Systems  Constitutional: Positive for  weight loss.  Gastrointestinal: Negative for nausea and vomiting.  Musculoskeletal:       Negative muscle weakness  Psychiatric/Behavioral: Negative for suicidal ideas.       + Anxiety    PHYSICAL EXAM: Pt in no acute distress  RECENT LABS AND TESTS: BMET    Component Value Date/Time   NA 138 11/14/2018 0901   NA 140 10/30/2018 0953   NA 140 10/11/2017 1122   K 4.7 11/14/2018 0901   K 4.2 10/11/2017 1122   CL 105 11/14/2018 0901   CL 103 10/11/2017 1122   CO2 29 11/14/2018 0901   CO2 29 10/11/2017 1122   GLUCOSE 126 (H) 11/14/2018 0901   GLUCOSE 95 10/11/2017 1122   BUN 17 11/14/2018 0901   BUN 10 10/30/2018 0953   BUN 10 10/11/2017 1122   CREATININE 0.73 11/14/2018 0901   CREATININE 0.9 10/11/2017 1122   CALCIUM 9.5 11/14/2018 0901   CALCIUM 8.7 10/11/2017 1122   GFRNONAA >60 11/14/2018 0901   GFRNONAA >89 10/16/2012 1646   GFRAA >60 11/14/2018 0901   GFRAA >89 10/16/2012 1646   Lab Results  Component Value Date   HGBA1C 5.8 (H) 10/30/2018   Lab Results  Component Value Date   INSULIN 10.3 10/30/2018   CBC    Component Value Date/Time   WBC 6.5 11/14/2018 0901   WBC 12.2 (H) 12/04/2017 1103   RBC 4.28 11/14/2018 0901   HGB 12.5 11/14/2018 0901   HGB 12.6  10/30/2018 0953   HGB 13.4 10/11/2017 1122   HCT 39.0 11/14/2018 0901   HCT 38.0 10/30/2018 0953   HCT 40.9 10/11/2017 1122   PLT 298 11/14/2018 0901   PLT 291 10/11/2017 1122   MCV 91.1 11/14/2018 0901   MCV 88 10/30/2018 0953   MCV 92 10/11/2017 1122   MCH 29.2 11/14/2018 0901   MCHC 32.1 11/14/2018 0901   RDW 12.8 11/14/2018 0901   RDW 13.2 10/30/2018 0953   RDW 13.3 10/11/2017 1122   LYMPHSABS 2.1 11/14/2018 0901   LYMPHSABS 2.7 10/30/2018 0953   LYMPHSABS 2.8 10/11/2017 1122   MONOABS 0.5 11/14/2018 0901   EOSABS 0.1 11/14/2018 0901   EOSABS 0.1 10/30/2018 0953   EOSABS 0.0 10/11/2017 1122   BASOSABS 0.1 11/14/2018 0901   BASOSABS 0.1 10/30/2018 0953   BASOSABS 0.0 10/11/2017 1122   Iron/TIBC/Ferritin/ %Sat No results found for: IRON, TIBC, FERRITIN, IRONPCTSAT Lipid Panel     Component Value Date/Time   CHOL 182 10/30/2018 0953   TRIG 98 10/30/2018 0953   HDL 56 10/30/2018 0953   LDLCALC 106 (H) 10/30/2018 0953   Hepatic Function Panel     Component Value Date/Time   PROT 6.7 11/14/2018 0901   PROT 6.7 10/30/2018 0953   PROT 6.8 10/11/2017 1122   ALBUMIN 4.2 11/14/2018 0901   ALBUMIN 4.1 10/30/2018 0953   AST 18 11/14/2018 0901   ALT 18 11/14/2018 0901   ALT 36 10/11/2017 1122   ALKPHOS 85 11/14/2018 0901   ALKPHOS 83 10/11/2017 1122   BILITOT 0.4 11/14/2018 0901      Component Value Date/Time   TSH 2.280 10/30/2018 0953   TSH 1.351 01/13/2013 1446      I, Trixie Dredge, am acting as Location manager for Dennard Nip, MD I have reviewed the above documentation for accuracy and completeness, and I agree with the above. -Dennard Nip, MD

## 2019-05-28 DIAGNOSIS — N3000 Acute cystitis without hematuria: Secondary | ICD-10-CM | POA: Diagnosis not present

## 2019-05-28 DIAGNOSIS — Z23 Encounter for immunization: Secondary | ICD-10-CM | POA: Diagnosis not present

## 2019-05-28 DIAGNOSIS — Z792 Long term (current) use of antibiotics: Secondary | ICD-10-CM | POA: Diagnosis not present

## 2019-05-31 ENCOUNTER — Other Ambulatory Visit: Payer: Self-pay | Admitting: Family Medicine

## 2019-05-31 DIAGNOSIS — M47816 Spondylosis without myelopathy or radiculopathy, lumbar region: Secondary | ICD-10-CM

## 2019-06-03 NOTE — Progress Notes (Signed)
Office: [phone]  /  Fax: [phone]    Date: June 11, 2019   Appointment Start Time: 10:30am Duration: 27 minutes Provider: Glennie Isle, Psy.D. Type of Session: Individual Therapy  Location of Patient: Car in parking lot Location of Provider: [facility] Type of Contact: Telepsychological Visit via [employer] WebEx   Session Content: Barbara Mcmahon is a 43 y.o. female presenting via [place] for a follow-up appointment to address the previously established treatment goal of decreasing emotional eating. Today's appointment was a telepsychological visit, as this provider's clinic is seeing a limited number of patients for in-person visits due to COVID-19. Therapeutic services will resume to in-person appointments once deemed appropriate. Barbara Mcmahon expressed understanding regarding the rationale for telepsychological services, and provided verbal consent for today's appointment. Prior to proceeding with today's appointment, Barbara Mcmahon's physical location at the time of this appointment was obtained. Barbara Mcmahon reported she was in the car of the parking lot of a laundromat and provided the address. In the event of technical difficulties, Barbara Mcmahon shared a phone number she could be reached at. Barbara Mcmahon and this provider participated in today's telepsychological service. Also, Barbara Mcmahon denied anyone else being present in the car or on the WebEx appointment.  This provider conducted a brief check-in and verbally administered the PHQ-9 and GAD-7. [name] stated her washer machine broke down; therefore, she has to go to a laundromat. She also reported being sick recently and noted, "I'm starting to bounce back." Since the last appointment with this provider, she recalled experiencing one panic attack and reported she was able to cope. In addition, Sa discussed she continues to engage in mindfulness. This was positively reinforced. Aside from decreased appetite when she  was sick, she noted she "followed the plan." She denied episodes of emotional eating since the last appointment with this provider. To further assist with mindfulness, psychoeducation regarding formal (e.g., setting aside a specific time daily to engage in an exercise) and informal (e.g., cultivating awareness in the present moment and taking a non-judgmental approach while engaging in day-to-day tasks) mindfulness was provided. This provider discussed the utilization of YouTube for mindfulness exercises (e.g., videos by Merri Ray). Session further focused on identifying a time every day for Barbara Mcmahon to engage in formal mindfulness exercise(s). She noted 8pm in the evening is a good time, as the children are playing and her husband is wrapping up his work day. Obstacles/barriers that may prevent her from following through were explored. 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 Thus, a balance of engaging in mindfulness and working on her blog was discussed. Furthermore, this provider discussed termination planning, including the frequency of upcoming appointments. Barbara Mcmahon was receptive to scheduling two additional follow-up appointments. [name] was receptive to today's session as evidenced by openness to sharing, responsiveness to feedback, and willingness to continue engaging in learned skills.  Mental Status Examination:  Appearance: neat Behavior: cooperative Mood: euthymic Affect: mood congruent Speech: normal in rate, volume, and tone Eye Contact: appropriate Psychomotor Activity: appropriate Thought Process: linear, logical, and goal directed  Content/Perceptual Disturbances: no hallucinations, delusions, bizarre thinking or behavior reported or observed and no evidence of suicidal and homicidal ideation, plan, and intent Orientation: time, person, place and purpose of appointment Cognition/Sensorium: memory, attention, language, and fund of knowledge intact  Insight: good  Judgment: good  Structured Assessment Results: The Patient Health Questionnaire-9 (PHQ-9) is a self-report measure that assesses symptoms and severity of depression over the course of the last two weeks. Barbara Mcmahon  obtained a score of 1 suggesting minimal depression. Barbara Mcmahon finds the endorsed symptoms to be not difficult at all. Little interest or pleasure in doing things 0  Feeling down, depressed, or hopeless 0  Trouble falling or staying asleep, or sleeping too much 0  Feeling tired or having little energy 0  Poor appetite or overeating 1  Feeling bad about yourself --- or that you are a failure or have let yourself or your family down 0  Trouble concentrating on things, such as reading the newspaper or watching television 0  Moving or speaking so slowly that other people could have noticed? Or the opposite --- being so fidgety or restless that you have been moving around a lot more than usual 0  Thoughts that you would be better off dead or hurting yourself in some way 0  PHQ-9 Score 1    The Generalized Anxiety Disorder-7 (GAD-7) is a brief self-report measure that assesses symptoms of anxiety over the course of the last two weeks. Barbara Mcmahon obtained a score of 3 suggesting minimal anxiety. Barbara Mcmahon finds the endorsed symptoms to be not difficult at all. Feeling nervous, anxious, on edge 0  Not being able to stop or control worrying 0  Worrying too much about different things 0  Trouble relaxing 3  Being so restless that it's hard to sit still 0  Becoming easily annoyed or irritable 0  Feeling afraid as if something awful might happen 0  GAD-7 Score 3   Interventions:  Conducted a brief chart review Verbal administration of PHQ-9 and GAD-7 for symptom monitoring Provided empathic reflections and validation Reviewed content from the previous session Engaged patient in problem solving Further psychoeducation provided regarding mindfulness Discussed termination planning Provided  positive reinforcement Employed supportive psychotherapy interventions to facilitate reduced distress, and to improve coping skills with identified stressors  DSM-5 Diagnosis: 311 (F32.8) Other Specified Depressive Disorder, Emotional Eating Behaviors  Treatment Goal & Progress: During the initial appointment with this provider, the following treatment goal was established: decrease emotional eating. Lisanna has demonstrated progress in her goal as evidenced by increased awareness of hunger patterns and triggers for emotional eating. She denied episodes of emotional eating since the last appointment and continues to demonstrate willingness to engage in learned skills.   Plan: Damilola continues to appear able and willing to participate as evidenced by engagement in reciprocal conversation, and asking questions for clarification as appropriate. The next appointment will be scheduled in one month, which will be via [employer]. The next session will focus on the introduction of thought defusion.

## 2019-06-08 ENCOUNTER — Telehealth: Payer: Self-pay | Admitting: Hematology & Oncology

## 2019-06-08 NOTE — Telephone Encounter (Signed)
lmom to inform patient of r/s appt to 9/9 at 745 due to MD out of office 9/3

## 2019-06-10 ENCOUNTER — Other Ambulatory Visit: Payer: Self-pay

## 2019-06-10 ENCOUNTER — Encounter (INDEPENDENT_AMBULATORY_CARE_PROVIDER_SITE_OTHER): Payer: Self-pay | Admitting: Family Medicine

## 2019-06-10 ENCOUNTER — Telehealth (INDEPENDENT_AMBULATORY_CARE_PROVIDER_SITE_OTHER): Payer: BC Managed Care – PPO | Admitting: Family Medicine

## 2019-06-10 DIAGNOSIS — Z6841 Body Mass Index (BMI) 40.0 and over, adult: Secondary | ICD-10-CM

## 2019-06-10 DIAGNOSIS — R7303 Prediabetes: Secondary | ICD-10-CM | POA: Diagnosis not present

## 2019-06-10 DIAGNOSIS — E559 Vitamin D deficiency, unspecified: Secondary | ICD-10-CM

## 2019-06-10 MED ORDER — VITAMIN D (ERGOCALCIFEROL) 1.25 MG (50000 UNIT) PO CAPS: 50000.0000 [IU] | ORAL_CAPSULE | ORAL | 0 refills | Status: DC

## 2019-06-10 MED ORDER — VICTOZA 18 MG/3ML ~~LOC~~ SOPN: 1.8000 mg | PEN_INJECTOR | SUBCUTANEOUS | 0 refills | Status: DC

## 2019-06-11 ENCOUNTER — Other Ambulatory Visit: Payer: Self-pay

## 2019-06-11 ENCOUNTER — Ambulatory Visit: Payer: BLUE CROSS/BLUE SHIELD | Admitting: Hematology & Oncology

## 2019-06-11 ENCOUNTER — Other Ambulatory Visit: Payer: BLUE CROSS/BLUE SHIELD

## 2019-06-11 ENCOUNTER — Ambulatory Visit (INDEPENDENT_AMBULATORY_CARE_PROVIDER_SITE_OTHER): Payer: BC Managed Care – PPO | Admitting: Psychology

## 2019-06-11 DIAGNOSIS — F3289 Other specified depressive episodes: Secondary | ICD-10-CM | POA: Diagnosis not present

## 2019-06-13 DIAGNOSIS — N3001 Acute cystitis with hematuria: Secondary | ICD-10-CM | POA: Diagnosis not present

## 2019-06-16 NOTE — Progress Notes (Signed)
Office: [phone]  /  Fax: [phone] TeleHealth Visit:  Barbara Mcmahon has verbally consented to this TeleHealth visit today. The patient is located at home, the provider is located at the [employer] [facility] office. The participants in this visit include the listed provider and patient. The visit was conducted today via [email or web address].  HPI:   Chief Complaint: OBESITY Barbara Mcmahon is here to discuss her progress with her obesity treatment plan. She is on the keep a food journal with 400-600 calories and 40+ grams of protein at supper daily and follow the Category 4 plan and is following her eating plan approximately 85 % of the time. She states she is exercising 0 minutes 0 times per week. Barbara Mcmahon has been doing well on her diet prescription and likes the freedom she gets with journaling dinner. Her hunger has been controlled, but has increased in the last week or so. She states her was 287 lbs today. We were unable to weigh the patient today for this TeleHealth visit. She feels as if she has maintained her weight since her last visit. She has lost 3 lbs since starting treatment with [name].  Pre-Diabetes Barbara Mcmahon has a diagnosis of pre-diabetes based on her elevated Hgb A1c and was informed this puts her at greater risk of developing diabetes. She has tolerated Victoza well and had noted decreased polyphagia, but this has increased in the last week. She continues to work on diet and exercise to decrease risk of diabetes. She denies nausea, vomiting, or hypoglycemia.  Vitamin D Deficiency Barbara Mcmahon has a diagnosis of vitamin D deficiency. She is stable on prescription Vit D and denies nausea, vomiting or muscle weakness.  ASSESSMENT AND PLAN:  Prediabetes  Vitamin D deficiency - Plan: Vitamin D, Ergocalciferol, (DRISDOL) 1.25 MG (50000 UT) CAPS capsule  Class 3 severe obesity with serious comorbidity and body mass index (BMI) of 45.0 to 49.9 in adult, unspecified obesity type [facility])   PLAN:  Pre-Diabetes Barbara Mcmahon will continue to work on weight loss, exercise, and decreasing simple carbohydrates in her diet to help decrease the risk of diabetes. We dicussed metformin including benefits and risks. She was informed that eating too many simple carbohydrates or too many calories at one sitting increases the likelihood of GI side effects. Barbara Mcmahon agrees to continue Victoza 1.8 mg SubQ q AM #3 pens and we will refill for 1 month. Barbara Mcmahon agrees to follow up with our clinic in 2 to 3 weeks as directed to monitor her progress.  Vitamin D Deficiency Barbara Mcmahon was informed that low vitamin D levels contributes to fatigue and are associated with obesity, breast, and colon cancer. Barbara Mcmahon agrees to continue taking prescription Vit D 50,000 IU every week #4 and we will refill for 1 month. She will follow up for routine testing of vitamin D, at least 2-3 times per year. She was informed of the risk of over-replacement of vitamin D and agrees to not increase her dose unless she discusses this with [name] first. Barbara Mcmahon agrees to follow up with our clinic in 2 to 3 weeks.  Obesity Barbara Mcmahon is currently in the action stage of change. As such, her goal is to continue with weight loss efforts She has agreed to keep a food journal with 400-600 calories and 40 grams of protein at supper daily and follow the Category 3 plan Barbara Mcmahon has been instructed to work up to a goal of 150 minutes of combined cardio and strengthening exercise per week for weight loss and overall health benefits.  We discussed the following Behavioral Modification Strategies today: work on meal planning and easy cooking plans and keeping healthy foods in the home   Barbara Mcmahon has agreed to follow up with our clinic in 2 to 3 weeks. She was informed of the importance of frequent follow up visits to maximize her success with intensive lifestyle modifications for her multiple health conditions.  ALLERGIES: Allergies   Allergen Reactions  . Morphine And Related Other (See Comments), Hives and Rash    Big mood changes BEHAVIOR CHANGES   . Shellfish Allergy Hives, Swelling and Anaphylaxis    Throat closes  . Codeine Hives and Itching    Pt says she can take percocet  . Clonazepam Itching  . Cinnamon   . Robitussin A-C [Guaifenesin-Codeine] Itching  . Tramadol Itching  . Yeast-Related Products Hives    MEDICATIONS: Current Outpatient Medications on File Prior to Visit  Medication Sig Dispense Refill  . acetaminophen (TYLENOL) 500 MG tablet Take 500 mg by mouth every 6 (six) hours as needed.    . cetirizine (ZYRTEC) 10 MG tablet Take 10 mg by mouth daily.    . DULoxetine (CYMBALTA) 30 MG capsule TAKE THREE CAPSULES BY MOUTH DAILY 90 capsule 1  . ELIQUIS 5 MG TABS tablet TAKE 1 TABLET BY MOUTH TWICE A DAY 180 tablet 3  . EPINEPHrine (EPI-PEN) 0.3 mg/0.3 mL SOAJ injection Inject 0.3 mLs (0.3 mg total) into the muscle once as needed (anaphylaxis). 1 Device 1  . hydrOXYzine (ATARAX/VISTARIL) 25 MG tablet Take 1-2 tablets (25-50 mg total) by mouth 2 (two) times daily as needed. 30 tablet 0  . Insulin Pen Needle (BD PEN NEEDLE NANO 2ND GEN) 32G X 4 MM MISC 1 Package by Does not apply route 2 (two) times daily. 100 each 0  . Melatonin 1 MG CAPS Take 1 capsule by mouth at bedtime.    . Multiple Vitamin (MULTI-VITAMIN) tablet Take by mouth.    Marland Kitchen olopatadine (PATANOL) 0.1 % ophthalmic solution     . penicillin v potassium (VEETID) 500 MG tablet Take 500 mg by mouth 4 (four) times daily.     No current facility-administered medications on file prior to visit.     PAST MEDICAL HISTORY: Past Medical History:  Diagnosis Date  . Abnormal Pap smear and cervical HPV (human papillomavirus)   . Anxiety   . Arthritis    BAck and sacral joint  . Back pain   . Colon polyps   . Constipation   . Gallbladder problem   . Generalized anxiety disorder   . IBS (irritable bowel syndrome)   . Infertility, female   .  Joint pain   . Multiple food allergies    Shellfish, cinnamon and yeast  . Obesity   . PCOS (polycystic ovarian syndrome)   . Prediabetes   . Pulmonary embolism ([place])   . Seizure disorder ([place])   . Vitamin D deficiency     PAST SURGICAL HISTORY: Past Surgical History:  Procedure Laterality Date  . CHOLECYSTECTOMY    . COLPOSCOPY    . DILATION AND CURETTAGE OF UTERUS    . GYNECOLOGIC CRYOSURGERY    . KNEE SURGERY  right    SOCIAL HISTORY: Social History   Tobacco Use  . Smoking status: Never Smoker  . Smokeless tobacco: Never Used  Substance Use Topics  . Alcohol use: No  . Drug use: No    FAMILY HISTORY: Family History  Problem Relation Age of Onset  . Hypertension Father   .  Depression Father   . Anxiety disorder Father   . Sleep apnea Father   . Obesity Father   . Diabetes Paternal Grandmother   . Hypertension Paternal Grandmother   . Stroke Paternal Grandmother   . Diabetes Paternal Grandfather   . Hypertension Paternal Grandfather   . Stroke Paternal Grandfather   . Hypertension Mother   . Depression Mother   . Anxiety disorder Mother   . Eating disorder Mother     ROS: Review of Systems  Constitutional: Negative for weight loss.  Gastrointestinal: Negative for nausea and vomiting.  Musculoskeletal:       Negative muscle weakness  Endo/Heme/Allergies:       Negative hypoglycemia Positive polyphagia    PHYSICAL EXAM: Pt in no acute distress  RECENT LABS AND TESTS: BMET    Component Value Date/Time   NA 138 11/14/2018 0901   NA 140 10/30/2018 0953   NA 140 10/11/2017 1122   K 4.7 11/14/2018 0901   K 4.2 10/11/2017 1122   CL 105 11/14/2018 0901   CL 103 10/11/2017 1122   CO2 29 11/14/2018 0901   CO2 29 10/11/2017 1122   GLUCOSE 126 (H) 11/14/2018 0901   GLUCOSE 95 10/11/2017 1122   BUN 17 11/14/2018 0901   BUN 10 10/30/2018 0953   BUN 10 10/11/2017 1122   CREATININE 0.73 11/14/2018 0901   CREATININE 0.9 10/11/2017 1122   CALCIUM  9.5 11/14/2018 0901   CALCIUM 8.7 10/11/2017 1122   GFRNONAA >60 11/14/2018 0901   GFRNONAA >89 10/16/2012 1646   GFRAA >60 11/14/2018 0901   GFRAA >89 10/16/2012 1646   Lab Results  Component Value Date   HGBA1C 5.8 (H) 10/30/2018   Lab Results  Component Value Date   INSULIN 10.3 10/30/2018   CBC    Component Value Date/Time   WBC 6.5 11/14/2018 0901   WBC 12.2 (H) 12/04/2017 1103   RBC 4.28 11/14/2018 0901   HGB 12.5 11/14/2018 0901   HGB 12.6 10/30/2018 0953   HGB 13.4 10/11/2017 1122   HCT 39.0 11/14/2018 0901   HCT 38.0 10/30/2018 0953   HCT 40.9 10/11/2017 1122   PLT 298 11/14/2018 0901   PLT 291 10/11/2017 1122   MCV 91.1 11/14/2018 0901   MCV 88 10/30/2018 0953   MCV 92 10/11/2017 1122   MCH 29.2 11/14/2018 0901   MCHC 32.1 11/14/2018 0901   RDW 12.8 11/14/2018 0901   RDW 13.2 10/30/2018 0953   RDW 13.3 10/11/2017 1122   LYMPHSABS 2.1 11/14/2018 0901   LYMPHSABS 2.7 10/30/2018 0953   LYMPHSABS 2.8 10/11/2017 1122   MONOABS 0.5 11/14/2018 0901   EOSABS 0.1 11/14/2018 0901   EOSABS 0.1 10/30/2018 0953   EOSABS 0.0 10/11/2017 1122   BASOSABS 0.1 11/14/2018 0901   BASOSABS 0.1 10/30/2018 0953   BASOSABS 0.0 10/11/2017 1122   Iron/TIBC/Ferritin/ %Sat No results found for: IRON, TIBC, FERRITIN, IRONPCTSAT Lipid Panel     Component Value Date/Time   CHOL 182 10/30/2018 0953   TRIG 98 10/30/2018 0953   HDL 56 10/30/2018 0953   LDLCALC 106 (H) 10/30/2018 0953   Hepatic Function Panel     Component Value Date/Time   PROT 6.7 11/14/2018 0901   PROT 6.7 10/30/2018 0953   PROT 6.8 10/11/2017 1122   ALBUMIN 4.2 11/14/2018 0901   ALBUMIN 4.1 10/30/2018 0953   AST 18 11/14/2018 0901   ALT 18 11/14/2018 0901   ALT 36 10/11/2017 1122   ALKPHOS 85 11/14/2018 0901  ALKPHOS 83 10/11/2017 1122   BILITOT 0.4 11/14/2018 0901      Component Value Date/Time   TSH 2.280 10/30/2018 0953   TSH 1.351 01/13/2013 1446      I, Trixie Dredge, am acting as  transcriptionist for Dennard Nip, MD I have reviewed the above documentation for accuracy and completeness, and I agree with the above. -Dennard Nip, MD

## 2019-06-17 ENCOUNTER — Inpatient Hospital Stay: Payer: BC Managed Care – PPO | Admitting: Hematology & Oncology

## 2019-06-17 ENCOUNTER — Other Ambulatory Visit: Payer: Self-pay

## 2019-06-17 ENCOUNTER — Telehealth: Payer: Self-pay | Admitting: Hematology & Oncology

## 2019-06-17 ENCOUNTER — Encounter: Payer: Self-pay | Admitting: Hematology & Oncology

## 2019-06-17 ENCOUNTER — Inpatient Hospital Stay: Payer: BC Managed Care – PPO | Attending: Hematology & Oncology

## 2019-06-17 VITALS — BP 142/82 | HR 82 | Temp 98.0°F

## 2019-06-17 DIAGNOSIS — I2699 Other pulmonary embolism without acute cor pulmonale: Secondary | ICD-10-CM | POA: Diagnosis not present

## 2019-06-17 DIAGNOSIS — I2782 Chronic pulmonary embolism: Secondary | ICD-10-CM

## 2019-06-17 DIAGNOSIS — E282 Polycystic ovarian syndrome: Secondary | ICD-10-CM | POA: Diagnosis not present

## 2019-06-17 DIAGNOSIS — I2601 Septic pulmonary embolism with acute cor pulmonale: Secondary | ICD-10-CM

## 2019-06-17 DIAGNOSIS — Z7901 Long term (current) use of anticoagulants: Secondary | ICD-10-CM | POA: Insufficient documentation

## 2019-06-17 LAB — CMP (CANCER CENTER ONLY)
ALT: 20 U/L (ref 0–44)
AST: 15 U/L (ref 15–41)
Albumin: 3.6 g/dL (ref 3.5–5.0)
Alkaline Phosphatase: 92 U/L (ref 38–126)
Anion gap: 7 (ref 5–15)
BUN: 12 mg/dL (ref 6–20)
CO2: 25 mmol/L (ref 22–32)
Calcium: 8.6 mg/dL — ABNORMAL LOW (ref 8.9–10.3)
Chloride: 105 mmol/L (ref 98–111)
Creatinine: 0.74 mg/dL (ref 0.44–1.00)
GFR, Est AFR Am: >60 mL/min (ref >60)
GFR, Estimated: >60 mL/min (ref >60)
Glucose, Bld: 97 mg/dL (ref 70–99)
Potassium: 3.9 mmol/L (ref 3.5–5.1)
Sodium: 137 mmol/L (ref 135–145)
Total Bilirubin: 0.4 mg/dL (ref 0.3–1.2)
Total Protein: 6.5 g/dL (ref 6.5–8.1)

## 2019-06-17 LAB — CBC WITH DIFFERENTIAL (CANCER CENTER ONLY)
Abs Immature Granulocytes: 0.04 10*3/uL (ref 0.00–0.07)
Basophils Absolute: 0.1 10*3/uL (ref 0.0–0.1)
Basophils Relative: 1 %
Eosinophils Absolute: 0.1 10*3/uL (ref 0.0–0.5)
Eosinophils Relative: 1 %
HCT: 38.8 % (ref 36.0–46.0)
Hemoglobin: 12.9 g/dL (ref 12.0–15.0)
Immature Granulocytes: 1 %
Lymphocytes Relative: 31 %
Lymphs Abs: 2.3 10*3/uL (ref 0.7–4.0)
MCH: 29.8 pg (ref 26.0–34.0)
MCHC: 33.2 g/dL (ref 30.0–36.0)
MCV: 89.6 fL (ref 80.0–100.0)
Monocytes Absolute: 0.6 10*3/uL (ref 0.1–1.0)
Monocytes Relative: 8 %
Neutro Abs: 4.5 10*3/uL (ref 1.7–7.7)
Neutrophils Relative %: 58 %
Platelet Count: 315 10*3/uL (ref 150–400)
RBC: 4.33 MIL/uL (ref 3.87–5.11)
RDW: 12.9 % (ref 11.5–15.5)
WBC Count: 7.7 10*3/uL (ref 4.0–10.5)
nRBC: 0 % (ref 0.0–0.2)

## 2019-06-17 NOTE — Progress Notes (Signed)
Hematology and Oncology Follow Up Visit  Barbara Mcmahon [id number] December 31, 1975 43 y.o. 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 twice daily-complete 1 year of therapy in September 2019  Eilquis 2.5 mg po BID - start 06/08/2018     Interim History:  Barbara Mcmahon is back for follow-up.  We saw her 6 months ago.  Since then, she been doing okay.  She did have a kidney infection about a month ago.  She finally had to take ciprofloxacin for the kidney infection to improve.  She is still trying to recover from this.  She has been managing the pandemic fairly well.  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  She is not been able to travel.  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  This sounds quite interesting.  Is going to deal with crimes in [place].  She is doing well with the Eliquis.  She had little bit of hematuria with the kidney infection.  She has had no problems with cough or shortness of breath.  There is been no chest wall pain.  She has had no leg swelling.  There is been no fever.  She has had no nausea or vomiting.  Overall, her performance status currently is ECOG 0.     Medications:  Current Outpatient Medications:  .  acetaminophen (TYLENOL) 500 MG tablet, Take 500 mg by mouth every 6 (six) hours as needed., Disp: , Rfl:  .  cetirizine (ZYRTEC) 10 MG tablet, Take 10 mg by mouth daily., Disp: , Rfl:  .  DULoxetine (CYMBALTA) 30 MG capsule, TAKE THREE CAPSULES BY MOUTH DAILY, Disp: 90 capsule, Rfl: 1 .  ELIQUIS 5 MG TABS tablet, TAKE 1 TABLET BY MOUTH TWICE A DAY, Disp: 180 tablet, Rfl: 3 .  EPINEPHrine (EPI-PEN) 0.3 mg/0.3 mL SOAJ injection, Inject 0.3 mLs (0.3 mg total) into the muscle once as needed (anaphylaxis)., Disp: 1 Device, Rfl: 1 .  Insulin Pen Needle (BD PEN NEEDLE  NANO 2ND GEN) 32G X 4 MM MISC, 1 Package by Does not apply route 2 (two) times daily., Disp: 100 each, Rfl: 0 .  liraglutide (VICTOZA) 18 MG/3ML SOPN, Inject 0.3 mLs (1.8 mg total) into the skin every morning., Disp: 3 pen, Rfl: 0 .  Melatonin 1 MG CAPS, Take 1 capsule by mouth at bedtime., Disp: , Rfl:  .  Multiple Vitamin (MULTI-VITAMIN) tablet, Take by mouth., Disp: , Rfl:  .  olopatadine (PATANOL) 0.1 % ophthalmic solution, , Disp: , Rfl:  .  penicillin v potassium (VEETID) 500 MG tablet, Take 500 mg by mouth 4 (four) times daily., Disp: , Rfl:  .  Vitamin D, Ergocalciferol, (DRISDOL) 1.25 MG (50000 UT) CAPS capsule, Take 1 capsule (50,000 Units total) by mouth every 7 (seven) days., Disp: 4 capsule, Rfl: 0  Allergies:  Allergies  Allergen Reactions  . Morphine And Related Other (See Comments), Hives and Rash    Big mood changes BEHAVIOR CHANGES   . Shellfish Allergy Hives, Swelling and Anaphylaxis    Throat closes  . Codeine Hives and Itching    Pt says she can take percocet  . Clonazepam Itching  . Cinnamon   . Robitussin A-C [Guaifenesin-Codeine] Itching  . Tramadol Itching  . Yeast-Related Products Hives    Past Medical History,  Surgical history, Social history, and Family History were reviewed and updated.  Review of Systems: Review of Systems  Constitutional: Negative.   HENT:  Negative.   Eyes: Negative.   Respiratory: Negative.   Cardiovascular: Negative.   Gastrointestinal: Negative.   Endocrine: Negative.   Genitourinary: Negative.    Musculoskeletal: Negative.   Skin: Negative.   Neurological: Negative.   Hematological: Negative.   Psychiatric/Behavioral: Negative.     Physical Exam:  oral temperature is 98 F (36.7 C). Her blood pressure is 142/82 (abnormal) and her pulse is 82. Her oxygen saturation is 100%.   Wt Readings from Last 3 Encounters:  12/22/18 289 lb (131.1 kg)  12/09/18 295 lb (133.8 kg)  12/01/18 290 lb (131.5 kg)    Physical Exam  Vitals signs reviewed.  HENT:     Head: Normocephalic and atraumatic.  Eyes:     Pupils: Pupils are equal, round, and reactive to light.  Neck:     Musculoskeletal: Normal range of motion.  Cardiovascular:     Rate and Rhythm: Normal rate and regular rhythm.     Heart sounds: Normal heart sounds.  Pulmonary:     Effort: Pulmonary effort is normal.     Breath sounds: Normal breath sounds.  Abdominal:     General: Bowel sounds are normal.     Palpations: Abdomen is soft.  Musculoskeletal: Normal range of motion.        General: No tenderness or deformity.  Lymphadenopathy:     Cervical: No cervical adenopathy.  Skin:    General: Skin is warm and dry.     Findings: No erythema or rash.  Neurological:     Mental Status: She is alert and oriented to person, place, and time.  Psychiatric:        Behavior: Behavior normal.        Thought Content: Thought content normal.        Judgment: Judgment normal.      Lab Results  Component Value Date   WBC 7.7 06/17/2019   HGB 12.9 06/17/2019   HCT 38.8 06/17/2019   MCV 89.6 06/17/2019   PLT 315 06/17/2019     Chemistry      Component Value Date/Time   NA 137 06/17/2019 0804   NA 140 10/30/2018 0953   NA 140 10/11/2017 1122   K 3.9 06/17/2019 0804   K 4.2 10/11/2017 1122   CL 105 06/17/2019 0804   CL 103 10/11/2017 1122   CO2 25 06/17/2019 0804   CO2 29 10/11/2017 1122   BUN 12 06/17/2019 0804   BUN 10 10/30/2018 0953   BUN 10 10/11/2017 1122   CREATININE 0.74 06/17/2019 0804   CREATININE 0.9 10/11/2017 1122      Component Value Date/Time   CALCIUM 8.6 (L) 06/17/2019 0804   CALCIUM 8.7 10/11/2017 1122   ALKPHOS 92 06/17/2019 0804   ALKPHOS 83 10/11/2017 1122   AST 15 06/17/2019 0804   ALT 20 06/17/2019 0804   ALT 36 10/11/2017 1122   BILITOT 0.4 06/17/2019 0804       Impression and Plan: Barbara Mcmahon is a 43 year old white female.  She has polycystic ovaries.  She had a pulmonary embolism in September 2018.   For right now, we will maintain her on the Eliquis.  We will continue her on the Eliquis.  She feels much more confident being on Eliquis long-term.  We will have her come back in 6 more months.  Hopefully, she will have  no further urinary tract infections or other infections.  Volanda Napoleon, MD 9/9/20208:35 AM

## 2019-06-17 NOTE — Telephone Encounter (Signed)
lmom to inform patient of 6 month follow up per 9/9 los

## 2019-06-20 ENCOUNTER — Encounter (INDEPENDENT_AMBULATORY_CARE_PROVIDER_SITE_OTHER): Payer: Self-pay | Admitting: Family Medicine

## 2019-06-22 ENCOUNTER — Other Ambulatory Visit (INDEPENDENT_AMBULATORY_CARE_PROVIDER_SITE_OTHER): Payer: Self-pay

## 2019-06-22 DIAGNOSIS — R7303 Prediabetes: Secondary | ICD-10-CM

## 2019-06-22 MED ORDER — VICTOZA 18 MG/3ML ~~LOC~~ SOPN: 1.8000 mg | PEN_INJECTOR | SUBCUTANEOUS | 0 refills | Status: DC

## 2019-06-22 NOTE — Telephone Encounter (Signed)
Please advise 

## 2019-06-22 NOTE — Telephone Encounter (Signed)
Ok to do 28 DAY

## 2019-06-25 ENCOUNTER — Encounter: Payer: Self-pay | Admitting: Family Medicine

## 2019-06-25 ENCOUNTER — Ambulatory Visit: Payer: BC Managed Care – PPO | Admitting: Family Medicine

## 2019-06-25 ENCOUNTER — Other Ambulatory Visit: Payer: Self-pay

## 2019-06-25 VITALS — BP 131/59 | HR 80 | Temp 98.3°F | Ht 65.0 in

## 2019-06-25 DIAGNOSIS — Z87440 Personal history of urinary (tract) infections: Secondary | ICD-10-CM | POA: Diagnosis not present

## 2019-06-25 DIAGNOSIS — R319 Hematuria, unspecified: Secondary | ICD-10-CM

## 2019-06-25 LAB — POCT URINALYSIS DIPSTICK
Bilirubin, UA: NEGATIVE
Glucose, UA: NEGATIVE
Ketones, UA: NEGATIVE
Nitrite, UA: NEGATIVE
Protein, UA: NEGATIVE
Spec Grav, UA: 1.015 (ref 1.010–1.025)
Urobilinogen, UA: 0.2 E.U./dL
pH, UA: 7.5 (ref 5.0–8.0)

## 2019-06-25 NOTE — Progress Notes (Signed)
Acute Office Visit  Subjective:    Patient ID: Barbara Mcmahon, female    DOB: December 12, 1975, 43 y.o.   MRN: [id number]  Chief Complaint  Patient presents with  . Urinary Tract Infection    HPI Patient is in today for dysuria.  She says it initially started a couple weeks ago she started setting some discomfort while she was driving.  She actually did a tele-doc visit and had taken some Azo.  She was actually prescribed Macrobid.  It did not really seem to help so [facility]  She did a urinalysis there as well as a culture.  She was initially given Bactrim and then when the culture returned it showed strep B so she was actually switched to penicillin.  Again she had persistent symptoms though she actually [facility] and then was given Cipro for 7 days.  Just completed 7 days of Cipro about 7 days ago.  sxs never completely went away but ere better until about 2 days ago. No back pain not fever, chills or sweats or nausea.  No hematuria.  She still mostly just having discomfort and some frequency with urination.  Past Medical History:  Diagnosis Date  . Abnormal Pap smear and cervical HPV (human papillomavirus)   . Anxiety   . Arthritis    BAck and sacral joint  . Back pain   . Colon polyps   . Constipation   . Gallbladder problem   . Generalized anxiety disorder   . IBS (irritable bowel syndrome)   . Infertility, female   . Joint pain   . Multiple food allergies    Shellfish, cinnamon and yeast  . Obesity   . PCOS (polycystic ovarian syndrome)   . Prediabetes   . Pulmonary embolism ([place])   . Seizure disorder ([place])   . Vitamin D deficiency     Past Surgical History:  Procedure Laterality Date  . CHOLECYSTECTOMY    . COLPOSCOPY    . DILATION AND CURETTAGE OF UTERUS    . GYNECOLOGIC CRYOSURGERY    . KNEE SURGERY  right    Family History  Problem Relation Age of Onset  . Hypertension Father   . Depression Father   .  Anxiety disorder Father   . Sleep apnea Father   . Obesity Father   . Diabetes Paternal Grandmother   . Hypertension Paternal Grandmother   . Stroke Paternal Grandmother   . Diabetes Paternal Grandfather   . Hypertension Paternal Grandfather   . Stroke Paternal Grandfather   . Hypertension Mother   . Depression Mother   . Anxiety disorder Mother   . Eating disorder Mother     Social History   Socioeconomic History  . Marital status: Married    Spouse name: Albie Colbeck  . Number of children: 2  . Years of education: Not on file  . Highest education level: Not on file  Occupational History  . Occupation: Art gallery manager  Social Needs  . Financial resource strain: Not on file  . Food insecurity    Worry: Not on file    Inability: Not on file  . Transportation needs    Medical: Not on file    Non-medical: Not on file  Tobacco Use  . Smoking status: Never Smoker  . Smokeless tobacco: Never Used  Substance and Sexual Activity  . Alcohol use: No  . Drug use: No  . Sexual activity: Not Currently  Lifestyle  . Physical activity    Days per week: Not on file    Minutes per session: Not on file  . Stress: Not on file  Relationships  . Social Herbalist on phone: Not on file    Gets together: Not on file    Attends religious service: Not on file    Active member of club or organization: Not on file    Attends meetings of clubs or organizations: Not on file    Relationship status: Not on file  . Intimate partner violence    Fear of current or ex partner: Not on file    Emotionally abused: Not on file    Physically abused: Not on file    Forced sexual activity: Not on file  Other Topics Concern  . Not on file  Social History Narrative  . Not on file    Outpatient Medications Prior to Visit  Medication Sig Dispense Refill  . acetaminophen (TYLENOL) 500 MG tablet Take 500 mg by mouth every 6 (six) hours as needed.    . cetirizine  (ZYRTEC) 10 MG tablet Take 10 mg by mouth daily.    . DULoxetine (CYMBALTA) 30 MG capsule TAKE THREE CAPSULES BY MOUTH DAILY 90 capsule 1  . ELIQUIS 5 MG TABS tablet TAKE 1 TABLET BY MOUTH TWICE A DAY 180 tablet 3  . EPINEPHrine (EPI-PEN) 0.3 mg/0.3 mL SOAJ injection Inject 0.3 mLs (0.3 mg total) into the muscle once as needed (anaphylaxis). 1 Device 1  . Insulin Pen Needle (BD PEN NEEDLE NANO 2ND GEN) 32G X 4 MM MISC 1 Package by Does not apply route 2 (two) times daily. 100 each 0  . liraglutide (VICTOZA) 18 MG/3ML SOPN Inject 0.3 mLs (1.8 mg total) into the skin every morning. 9 pen 0  . Melatonin 1 MG CAPS Take 1 capsule by mouth at bedtime.    . Multiple Vitamin (MULTI-VITAMIN) tablet Take by mouth.    Marland Kitchen olopatadine (PATANOL) 0.1 % ophthalmic solution     . Vitamin D, Ergocalciferol, (DRISDOL) 1.25 MG (50000 UT) CAPS capsule Take 1 capsule (50,000 Units total) by mouth every 7 (seven) days. 4 capsule 0  . penicillin v potassium (VEETID) 500 MG tablet Take 500 mg by mouth 4 (four) times daily.     No facility-administered medications prior to visit.     Allergies  Allergen Reactions  . Morphine And Related Other (See Comments), Hives and Rash    Big mood changes BEHAVIOR CHANGES   . Shellfish Allergy Hives, Swelling and Anaphylaxis    Throat closes  . Codeine Hives and Itching    Pt says she can take percocet  . Clonazepam Itching  . Cinnamon   . Robitussin A-C [Guaifenesin-Codeine] Itching  . Tramadol Itching  . Yeast-Related Products Hives    ROS     Objective:    Physical Exam  Constitutional: She is oriented to person, place, and time. She appears well-developed and well-nourished.  HENT:  Head: Normocephalic and atraumatic.  Eyes: Conjunctivae and EOM are normal.  Cardiovascular: Normal rate.  Pulmonary/Chest: Effort normal.  Neurological: She is alert and oriented to person, place, and time.  Skin: Skin is dry. No pallor.  Psychiatric: She has a normal mood and  affect. Her behavior is normal.  Vitals reviewed.   BP (!) 131/59   Pulse 80   Temp 98.3 F (36.8 C)   Ht 5\' 5"  (1.651 m)   SpO2 98%   BMI  48.09 kg/m  Wt Readings from Last 3 Encounters:  12/22/18 289 lb (131.1 kg)  12/09/18 295 lb (133.8 kg)  12/01/18 290 lb (131.5 kg)    There are no preventive care reminders to display for this patient.  There are no preventive care reminders to display for this patient.   Lab Results  Component Value Date   TSH 2.280 10/30/2018   Lab Results  Component Value Date   WBC 7.7 06/17/2019   HGB 12.9 06/17/2019   HCT 38.8 06/17/2019   MCV 89.6 06/17/2019   PLT 315 06/17/2019   Lab Results  Component Value Date   NA 137 06/17/2019   K 3.9 06/17/2019   CO2 25 06/17/2019   GLUCOSE 97 06/17/2019   BUN 12 06/17/2019   CREATININE 0.74 06/17/2019   BILITOT 0.4 06/17/2019   ALKPHOS 92 06/17/2019   AST 15 06/17/2019   ALT 20 06/17/2019   PROT 6.5 06/17/2019   ALBUMIN 3.6 06/17/2019   CALCIUM 8.6 (L) 06/17/2019   ANIONGAP 7 06/17/2019   Lab Results  Component Value Date   CHOL 182 10/30/2018   Lab Results  Component Value Date   HDL 56 10/30/2018   Lab Results  Component Value Date   LDLCALC 106 (H) 10/30/2018   Lab Results  Component Value Date   TRIG 98 10/30/2018   No results found for: CHOLHDL Lab Results  Component Value Date   HGBA1C 5.8 (H) 10/30/2018       Assessment & Plan:   Problem List Items Addressed This Visit    None    Visit Diagnoses    History of UTI    -  Primary   Relevant Orders   POCT urinalysis dipstick (Completed)   Hematuria, unspecified type       Relevant Orders   Urine Culture     Recurrent UTI-[facility] it looks like her second culture did grow out Klebsiella and enterococcus which are classically more urinary tract bacteria.  And based on the cultures the Cipro should have cleared up the infection so I am not sure why she is having persistent symptoms.   We did repeat a urinalysis today which was negative for nitrites and just showed some small leukocytes which can be from wiping.  There was a large amount of blood in the urine was cloudy.  Will send for culture and will hold off on antibiotic treatment until we get the culture back.  And will go from there.  May need to consider urology referral as this will have been the possibly fifth antibiotic that she is taken.  We will also need to follow-up on the blood in the urine would like to recheck that in 1 to 2 weeks with a microscopic evaluation for whole red blood cells.  No orders of the defined types were placed in this encounter.    Beatrice Lecher, MD

## 2019-06-25 NOTE — Progress Notes (Signed)
She has been seen at [facility] on 2 different occassions 8/22 and 06/13/2019. Maryruth Eve, Lahoma Crocker, CMA

## 2019-06-26 ENCOUNTER — Encounter: Payer: Self-pay | Admitting: Family Medicine

## 2019-06-26 LAB — URINE CULTURE
MICRO NUMBER:: 893823
SPECIMEN QUALITY:: ADEQUATE

## 2019-06-29 NOTE — Progress Notes (Signed)
Office: [phone]  /  Fax: [phone]    Date: July 09, 2019   Appointment Start Time: 10:07am Duration: 28 minutes Provider: Glennie Isle, Psy.D. Type of Session: Individual Therapy  Location of Patient: Home Location of Provider: [facility] Type of Contact: Telepsychological Visit via [employer] Web[employer]   Session Content: Barbara Mcmahon is a 43 y.o. female presenting via [place] for a follow-up appointment to address the previously established treatment goal of decreasing emotional eating. Of note, today's appointment was initiated late due to this provider. Today's appointment was a telepsychological visit, as it is an option for appointments to reduce exposure to COVID-19. Barbara Mcmahon expressed understanding regarding the rationale for telepsychological services, and provided verbal consent for today's appointment. Prior to proceeding with today's appointment, Barbara Mcmahon's physical location at the time of this appointment was obtained. Barbara Mcmahon reported she was at home and provided the address. In the event of technical difficulties, Barbara Mcmahon shared a phone number she could be reached at. Barbara Mcmahon and this provider participated in today's telepsychological service. Also, Barbara Mcmahon denied anyone else being present in the room or on the WebEx appointment.  This provider conducted a brief check-in and verbally administered the PHQ-9 and GAD-7. Barbara Mcmahon shared she is still sick and explained, "I had a very severe UTI." The aforementioned contributed to decreased appetite. 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 Moreover, Barbara Mcmahon described her motivation to continue with her eating habits has been waxing and waning. Since the last appointment with this provider, Barbara Mcmahon stated an increase in emotional eating due to being sick. She indicated, "It's because there are just some things that don't sound good." Nevertheless, she described making better  choices and engaging in portion control. To assist with motivation, psychoeducation regarding the connection between thoughts, feelings, and behaviors was provided. Examples were provided to highlight the aforementioned. Psychoeducation regarding thought defusion, including its impact on emotional eating and overall well-being was also provided. Barbara Mcmahon was led through a thought defusion exercise, and a handout with various exercises was provided. Braydon was encouraged to engage in the thought defusion exercises between now and the next appointment with this provider. Barbara Mcmahon agreed. She used the following thought for today's exercise: "I am a failure." Following the exercise, Barbara Mcmahon described previously trying to push away the aforementioned thought. As the exercise progressed, she described the thought as feeing less heavy. She was led through two additional thought defusion exercises. Barbara Mcmahon provided verbal consent during today's appointment for this provider to send a handout with thought defusion exercises via e-mail. Furthermore, she indicated she "forgot" about mindfulness, but noted a plan to start engaging in exercises regularly again. Overall, Barbara Mcmahon was receptive to today's session as evidenced by openness to sharing, responsiveness to feedback, and willingness to engage in learned skills.  Mental Status Examination:  Appearance: neat Behavior: cooperative Mood: euthymic Affect: mood congruent Speech: normal in rate, volume, and tone Eye Contact: appropriate Psychomotor Activity: appropriate Thought Process: linear, logical, and goal directed  Content/Perceptual Disturbances: no hallucinations, delusions, bizarre thinking or behavior reported or observed and no evidence of suicidal and homicidal ideation, plan, and intent Orientation: time, person, place and purpose of appointment Cognition/Sensorium: memory, attention, language, and fund of knowledge intact  Insight: good  Judgment: good  Structured Assessment Results: The Patient Health Questionnaire-9 (PHQ-9) is a self-report measure that assesses symptoms and severity of depression over the course of the last two weeks. Barbara Mcmahon obtained a score of 3 suggesting minimal depression. Barbara Mcmahon the endorsed  symptoms to be not difficult at all. Barbara Mcmahon in doing things 0  Feeling down, depressed, or hopeless 0  Trouble falling or staying asleep, or sleeping too much 0  Feeling tired or having Barbara energy 1  Poor appetite or overeating 2  Feeling bad about yourself --- or that you are a failure or have let yourself or your family down 0  Trouble concentrating on things, such as reading the newspaper or watching television 0  Moving or speaking so slowly that other people could have noticed? Or the opposite --- being so fidgety or restless that you have been moving around a lot more than usual 0  Thoughts that you would be better off dead or hurting yourself in some way 0  PHQ-9 Score 3    The Generalized Anxiety Disorder-7 (GAD-7) is a brief self-report measure that assesses symptoms of anxiety over the course of the last two weeks. Barbara Mcmahon obtained a score of 3 suggesting minimal anxiety. Barbara Mcmahon the endorsed symptoms to be not difficult at all. Feeling nervous, anxious, on edge 0  Not being able to stop or control worrying 0  Worrying too much about different things 0  Trouble relaxing 3  Being so restless that it's hard to sit still 0  Becoming easily annoyed or irritable 0  Feeling afraid as if something awful might happen 0  GAD-7 Score 3   Interventions:  Conducted a brief chart review Verbal administration of PHQ-9 and GAD-7 for symptom monitoring Provided empathic reflections and validation Psychoeducation provided regarding the connection between thoughts, feelings, and behaviors Psychoeducation provided regarding thought defusion Engaged patient in a thought  defusion exercise Employed supportive psychotherapy interventions to facilitate reduced distress, and to improve coping skills with identified stressors Employed acceptance and commitment interventions to emphasize mindfulness and acceptance without struggle  DSM-5 Diagnosis: 311 (F32.8) Other Specified Depressive Disorder, Emotional Eating Behaviors  Treatment Goal & Progress: During the initial appointment with this provider, the following treatment goal was established: decrease emotional eating. Mendi has demonstrated progress in her goal as evidenced by increased awareness of hunger patterns and triggers for emotional eating. Asana also continues to demonstrate willingness to engage in learned skill(s).  Plan: Violeta continues to appear able and willing to participate as evidenced by engagement in reciprocal conversation, and asking questions for clarification as appropriate. The next appointment will be scheduled in three weeks, which will be via [employer]. The next session will focus further on thought defusion.

## 2019-07-01 ENCOUNTER — Encounter (INDEPENDENT_AMBULATORY_CARE_PROVIDER_SITE_OTHER): Payer: Self-pay | Admitting: Family Medicine

## 2019-07-01 ENCOUNTER — Telehealth (INDEPENDENT_AMBULATORY_CARE_PROVIDER_SITE_OTHER): Payer: BC Managed Care – PPO | Admitting: Family Medicine

## 2019-07-01 ENCOUNTER — Other Ambulatory Visit: Payer: Self-pay

## 2019-07-01 DIAGNOSIS — Z6841 Body Mass Index (BMI) 40.0 and over, adult: Secondary | ICD-10-CM

## 2019-07-01 DIAGNOSIS — R7303 Prediabetes: Secondary | ICD-10-CM | POA: Diagnosis not present

## 2019-07-05 NOTE — Progress Notes (Signed)
Office: [phone]  /  Fax: [phone] TeleHealth Visit:  Barbara Mcmahon has verbally consented to this TeleHealth visit today. The patient is located at home, the provider is located at the [employer] [facility] office. The participants in this visit include the listed provider and patient. The visit was conducted today via [email or web address].  HPI:   Chief Complaint: OBESITY Barbara Mcmahon is here to discuss her progress with her obesity treatment plan. She is on the keep a food journal with 400-600 calories and 40 grams of protein at supper daily and follow the Category 3 plan and is following her eating plan approximately 90 % of the time. She states she is doing yoga for 30 minutes 3 times per week. Barbara Mcmahon has maintained her weight but hasn't followed her eating plan as strictly while dealing with an especially resistance UTI. She is back on track now.  We were unable to weigh the patient today for this TeleHealth visit. She feels as if she has gained weight since her last visit. She has lost 3 lbs since starting treatment with [name].  Pre-Diabetes Barbara Mcmahon has a diagnosis of pre-diabetes based on her elevated Hgb A1c and was informed this puts her at greater risk of developing diabetes. Last A1c was 5.8, she is working on diet and exercise but still struggling with polyphagia. She notes is is worse after not eating much while sick. She denies hypoglycemia.  ASSESSMENT AND PLAN:  Prediabetes  Class 3 severe obesity with serious comorbidity and body mass index (BMI) of 45.0 to 49.9 in adult, unspecified obesity type [facility])  PLAN:  Pre-Diabetes Arri will continue to work on weight loss, exercise, and decreasing simple carbohydrates in her diet to help decrease the risk of diabetes. We dicussed metformin including benefits and risks. She was informed that eating too many simple carbohydrates or too many calories at one sitting increases the likelihood of GI side effects. Barbara Mcmahon agrees  to continue Victoza at 1.8 mg and back to diet prescription. We may change to Saxenda in order to increase dose at next visit. Barbara Mcmahon agrees to follow up with our clinic in 2 weeks as directed to monitor her progress.  I spent > than 50% of the 25 minute visit on counseling as documented in the note.  Obesity Barbara Mcmahon is currently in the action stage of change. As such, her goal is to continue with weight loss efforts She has agreed to follow the Category 4 plan Barbara Mcmahon has been instructed to work up to a goal of 150 minutes of combined cardio and strengthening exercise per week for weight loss and overall health benefits. We discussed the following Behavioral Modification Strategies today: work on meal planning and easy cooking plans and keeping healthy foods in the home   Barbara Mcmahon has agreed to follow up with our clinic in 2 weeks. She was informed of the importance of frequent follow up visits to maximize her success with intensive lifestyle modifications for her multiple health conditions.  ALLERGIES: Allergies  Allergen Reactions  . Morphine And Related Other (See Comments), Hives and Rash    Big mood changes BEHAVIOR CHANGES   . Shellfish Allergy Hives, Swelling and Anaphylaxis    Throat closes  . Codeine Hives and Itching    Pt says she can take percocet  . Clonazepam Itching  . Cinnamon   . Robitussin A-C [Guaifenesin-Codeine] Itching  . Tramadol Itching  . Yeast-Related Products Hives    MEDICATIONS: Current Outpatient Medications on File Prior to Visit  Medication Sig Dispense Refill  . acetaminophen (TYLENOL) 500 MG tablet Take 500 mg by mouth every 6 (six) hours as needed.    . cetirizine (ZYRTEC) 10 MG tablet Take 10 mg by mouth daily.    . DULoxetine (CYMBALTA) 30 MG capsule TAKE THREE CAPSULES BY MOUTH DAILY 90 capsule 1  . ELIQUIS 5 MG TABS tablet TAKE 1 TABLET BY MOUTH TWICE A DAY 180 tablet 3  . EPINEPHrine (EPI-PEN) 0.3 mg/0.3 mL SOAJ injection Inject  0.3 mLs (0.3 mg total) into the muscle once as needed (anaphylaxis). 1 Device 1  . Insulin Pen Needle (BD PEN NEEDLE NANO 2ND GEN) 32G X 4 MM MISC 1 Package by Does not apply route 2 (two) times daily. 100 each 0  . liraglutide (VICTOZA) 18 MG/3ML SOPN Inject 0.3 mLs (1.8 mg total) into the skin every morning. 9 pen 0  . Melatonin 1 MG CAPS Take 1 capsule by mouth at bedtime.    . Multiple Vitamin (MULTI-VITAMIN) tablet Take by mouth.    Marland Kitchen olopatadine (PATANOL) 0.1 % ophthalmic solution     . Vitamin D, Ergocalciferol, (DRISDOL) 1.25 MG (50000 UT) CAPS capsule Take 1 capsule (50,000 Units total) by mouth every 7 (seven) days. 4 capsule 0   No current facility-administered medications on file prior to visit.     PAST MEDICAL HISTORY: Past Medical History:  Diagnosis Date  . Abnormal Pap smear and cervical HPV (human papillomavirus)   . Anxiety   . Arthritis    BAck and sacral joint  . Back pain   . Colon polyps   . Constipation   . Gallbladder problem   . Generalized anxiety disorder   . IBS (irritable bowel syndrome)   . Infertility, female   . Joint pain   . Multiple food allergies    Shellfish, cinnamon and yeast  . Obesity   . PCOS (polycystic ovarian syndrome)   . Prediabetes   . Pulmonary embolism ([place])   . Seizure disorder ([place])   . Vitamin D deficiency     PAST SURGICAL HISTORY: Past Surgical History:  Procedure Laterality Date  . CHOLECYSTECTOMY    . COLPOSCOPY    . DILATION AND CURETTAGE OF UTERUS    . GYNECOLOGIC CRYOSURGERY    . KNEE SURGERY  right    SOCIAL HISTORY: Social History   Tobacco Use  . Smoking status: Never Smoker  . Smokeless tobacco: Never Used  Substance Use Topics  . Alcohol use: No  . Drug use: No    FAMILY HISTORY: Family History  Problem Relation Age of Onset  . Hypertension Father   . Depression Father   . Anxiety disorder Father   . Sleep apnea Father   . Obesity Father   . Diabetes Paternal Grandmother   .  Hypertension Paternal Grandmother   . Stroke Paternal Grandmother   . Diabetes Paternal Grandfather   . Hypertension Paternal Grandfather   . Stroke Paternal Grandfather   . Hypertension Mother   . Depression Mother   . Anxiety disorder Mother   . Eating disorder Mother     ROS: Review of Systems  Constitutional: Negative for weight loss.  Endo/Heme/Allergies:       Positive polyphagia Negative hypoglycemia    PHYSICAL EXAM: Pt in no acute distress  RECENT LABS AND TESTS: BMET    Component Value Date/Time   NA 137 06/17/2019 0804   NA 140 10/30/2018 0953   NA 140 10/11/2017 1122   K 3.9 06/17/2019  0804   K 4.2 10/11/2017 1122   CL 105 06/17/2019 0804   CL 103 10/11/2017 1122   CO2 25 06/17/2019 0804   CO2 29 10/11/2017 1122   GLUCOSE 97 06/17/2019 0804   GLUCOSE 95 10/11/2017 1122   BUN 12 06/17/2019 0804   BUN 10 10/30/2018 0953   BUN 10 10/11/2017 1122   CREATININE 0.74 06/17/2019 0804   CREATININE 0.9 10/11/2017 1122   CALCIUM 8.6 (L) 06/17/2019 0804   CALCIUM 8.7 10/11/2017 1122   GFRNONAA >60 06/17/2019 0804   GFRNONAA >89 10/16/2012 1646   GFRAA >60 06/17/2019 0804   GFRAA >89 10/16/2012 1646   Lab Results  Component Value Date   HGBA1C 5.8 (H) 10/30/2018   Lab Results  Component Value Date   INSULIN 10.3 10/30/2018   CBC    Component Value Date/Time   WBC 7.7 06/17/2019 0804   WBC 12.2 (H) 12/04/2017 1103   RBC 4.33 06/17/2019 0804   HGB 12.9 06/17/2019 0804   HGB 12.6 10/30/2018 0953   HGB 13.4 10/11/2017 1122   HCT 38.8 06/17/2019 0804   HCT 38.0 10/30/2018 0953   HCT 40.9 10/11/2017 1122   PLT 315 06/17/2019 0804   PLT 291 10/11/2017 1122   MCV 89.6 06/17/2019 0804   MCV 88 10/30/2018 0953   MCV 92 10/11/2017 1122   MCH 29.8 06/17/2019 0804   MCHC 33.2 06/17/2019 0804   RDW 12.9 06/17/2019 0804   RDW 13.2 10/30/2018 0953   RDW 13.3 10/11/2017 1122   LYMPHSABS 2.3 06/17/2019 0804   LYMPHSABS 2.7 10/30/2018 0953   LYMPHSABS 2.8  10/11/2017 1122   MONOABS 0.6 06/17/2019 0804   EOSABS 0.1 06/17/2019 0804   EOSABS 0.1 10/30/2018 0953   EOSABS 0.0 10/11/2017 1122   BASOSABS 0.1 06/17/2019 0804   BASOSABS 0.1 10/30/2018 0953   BASOSABS 0.0 10/11/2017 1122   Iron/TIBC/Ferritin/ %Sat No results found for: IRON, TIBC, FERRITIN, IRONPCTSAT Lipid Panel     Component Value Date/Time   CHOL 182 10/30/2018 0953   TRIG 98 10/30/2018 0953   HDL 56 10/30/2018 0953   LDLCALC 106 (H) 10/30/2018 0953   Hepatic Function Panel     Component Value Date/Time   PROT 6.5 06/17/2019 0804   PROT 6.7 10/30/2018 0953   PROT 6.8 10/11/2017 1122   ALBUMIN 3.6 06/17/2019 0804   ALBUMIN 4.1 10/30/2018 0953   AST 15 06/17/2019 0804   ALT 20 06/17/2019 0804   ALT 36 10/11/2017 1122   ALKPHOS 92 06/17/2019 0804   ALKPHOS 83 10/11/2017 1122   BILITOT 0.4 06/17/2019 0804      Component Value Date/Time   TSH 2.280 10/30/2018 0953   TSH 1.351 01/13/2013 1446      I, Trixie Dredge, am acting as transcriptionist for Dennard Nip, MD I have reviewed the above documentation for accuracy and completeness, and I agree with the above. -Dennard Nip, MD

## 2019-07-07 ENCOUNTER — Encounter: Payer: Self-pay | Admitting: Family Medicine

## 2019-07-08 MED ORDER — FLUCONAZOLE 150 MG PO TABS: 150.0000 mg | ORAL_TABLET | Freq: Once | ORAL | 0 refills | Status: AC

## 2019-07-09 ENCOUNTER — Ambulatory Visit (INDEPENDENT_AMBULATORY_CARE_PROVIDER_SITE_OTHER): Payer: BC Managed Care – PPO | Admitting: Psychology

## 2019-07-09 ENCOUNTER — Other Ambulatory Visit: Payer: Self-pay

## 2019-07-09 DIAGNOSIS — F3289 Other specified depressive episodes: Secondary | ICD-10-CM | POA: Diagnosis not present

## 2019-07-09 NOTE — Progress Notes (Signed)
This encounter was created in error - please disregard.

## 2019-07-21 NOTE — Progress Notes (Signed)
Office: [phone]  /  Fax: [phone]    Date: August 03, 2019   Appointment Start Time: 2:00pm Duration: 29 minutes Provider: Glennie Isle, Psy.D. Type of Session: Individual Therapy  Location of Patient: Home Location of Provider: Provider's Home Type of Contact: Telepsychological Visit via [employer] WebEx   Session Content: Barbara Mcmahon is a 43 y.o. female presenting via [place] for a follow-up appointment to address the previously established treatment goal of decreasing emotional eating. Today's appointment was a telepsychological visit, as it is an option for appointments to reduce exposure to COVID-19. Barbara Mcmahon expressed understanding regarding the rationale for telepsychological services, and provided verbal consent for today's appointment. Prior to proceeding with today's appointment, Barbara Mcmahon's physical location at the time of this appointment was obtained. Barbara Mcmahon reported she was at home and provided the address. In the event of technical difficulties, Barbara Mcmahon shared a phone number she could be reached at. Barbara Mcmahon and this provider participated in today's telepsychological service. Also, Barbara Mcmahon denied anyone else being present in the room or on the WebEx appointment.  This provider conducted a brief check-in and verbally administered the PHQ-9 and GAD-7. Barbara Mcmahon shared, "Things have been crazy busy." Regarding eating, Barbara Mcmahon shared things have been "mostly okay." This was further explored. She explained she is not taking any medications (e.g., Victoza) as she will be having blood work done soon. Barbara Mcmahon further shared about a recent Halloween party she attended during which she made better choices. Progress was reflected. Moreover, Barbara Mcmahon shared she engaged in thought defusion exercises as needed. Positive reinforcement was provided. She was engaged in a thought defusion exercise, "Leaves on a Stream." Her experience was processed. She noted, "At first the leaves  were moving so fast;" however, she described them as slowing down as the exercise progressed. Barbara Mcmahon provided verbal consent during today's appointment for this provider to send the handout for today's exercise via e-mail. Overall, Barbara Mcmahon was receptive to today's session as evidenced by openness to sharing, responsiveness to feedback, and willingness to engage in learned skills.  Mental Status Examination:  Appearance: neat Behavior: cooperative Mood: euthymic Affect: mood congruent Speech: normal in rate, volume, and tone Eye Contact: appropriate Psychomotor Activity: appropriate Thought Process: linear, logical, and goal directed  Content/Perceptual Disturbances: no hallucinations, delusions, bizarre thinking or behavior reported or observed and no evidence of suicidal and homicidal ideation, plan, and intent Orientation: time, person, place and purpose of appointment Cognition/Sensorium: memory, attention, language, and fund of knowledge intact  Insight: good Judgment: good  Structured Assessment Results: The Patient Health Questionnaire-9 (PHQ-9) is a self-report measure that assesses symptoms and severity of depression over the course of the last two weeks. Barbara Mcmahon obtained a score of 0. Little interest or pleasure in doing things 0  Feeling down, depressed, or hopeless 0  Trouble falling or staying asleep, or sleeping too much 0  Feeling tired or having little energy 0  Poor appetite or overeating 0  Feeling bad about yourself --- or that you are a failure or have let yourself or your family down 0  Trouble concentrating on things, such as reading the newspaper or watching television 0  Moving or speaking so slowly that other people could have noticed? Or the opposite --- being so fidgety or restless that you have been moving around a lot more than usual 0  Thoughts that you would be better off dead or hurting yourself in some way 0  PHQ-9 Score 0    The Generalized Anxiety  Disorder-7 (GAD-7) is a  brief self-report measure that assesses symptoms of anxiety over the course of the last two weeks. Barbara Mcmahon obtained a score of 5 suggesting mild anxiety. Barbara Mcmahon finds the endorsed symptoms to be not difficult at all. Feeling nervous, anxious, on edge- current events 2  Not being able to stop or control worrying 0  Worrying too much about different things 0  Trouble relaxing 3  Being so restless that it's hard to sit still 0  Becoming easily annoyed or irritable 0  Feeling afraid as if something awful might happen 0  GAD-7 Score 5   Interventions:  Conducted a brief chart review Verbal administration of PHQ-9 and GAD-7 for symptom monitoring Provided empathic reflections and validation Reviewed content from the previous session Engaged patient in a thought defusion exercise Provided positive reinforcement Employed supportive psychotherapy interventions to facilitate reduced distress, and to improve coping skills with identified stressors Employed acceptance and commitment interventions to emphasize mindfulness and acceptance without struggle  DSM-5 Diagnosis: 311 (F32.8) Other Specified Depressive Disorder, Emotional Eating Behaviors  Treatment Goal & Progress: During the initial appointment with this provider, the following treatment goal was established: decrease emotional eating. Barbara Mcmahon has demonstrated progress in her goal as evidenced by increased awareness of hunger patterns and triggers for emotional eating. Barbara Mcmahon also reported a reduction in emotional eating and continues to demonstrate willingness to engage in learned skill(s).  Plan: Today was Barbara Mcmahon's last appointment with this provider as previously planned. She acknowledged understanding that she may request a follow-up appointment with this provider in the future as long as she is still established with the clinic. No further follow-up planned by this provider.

## 2019-07-22 ENCOUNTER — Other Ambulatory Visit: Payer: Self-pay

## 2019-07-22 ENCOUNTER — Encounter (INDEPENDENT_AMBULATORY_CARE_PROVIDER_SITE_OTHER): Payer: Self-pay | Admitting: Family Medicine

## 2019-07-22 ENCOUNTER — Telehealth (INDEPENDENT_AMBULATORY_CARE_PROVIDER_SITE_OTHER): Payer: BC Managed Care – PPO | Admitting: Family Medicine

## 2019-07-22 DIAGNOSIS — Z6841 Body Mass Index (BMI) 40.0 and over, adult: Secondary | ICD-10-CM

## 2019-07-22 DIAGNOSIS — E559 Vitamin D deficiency, unspecified: Secondary | ICD-10-CM | POA: Diagnosis not present

## 2019-07-22 DIAGNOSIS — R7303 Prediabetes: Secondary | ICD-10-CM | POA: Diagnosis not present

## 2019-07-22 MED ORDER — VITAMIN D (ERGOCALCIFEROL) 1.25 MG (50000 UNIT) PO CAPS: 50000.0000 [IU] | ORAL_CAPSULE | ORAL | 0 refills | Status: DC

## 2019-07-28 NOTE — Progress Notes (Signed)
Office: [phone]  /  Fax: [phone] TeleHealth Visit:  Barbara Mcmahon has verbally consented to this TeleHealth visit today. The patient is located at home, the provider is located at the [facility] office. The participants in this visit include the listed provider and patient. Barbara Mcmahon was unable to use realtime audiovisual technology today and the telehealth visit was conducted via telephone.   HPI:   Chief Complaint: OBESITY Barbara Mcmahon is here to discuss her progress with her obesity treatment plan. She is on the Category 4 plan and is following her eating plan approximately 90 % of the time. She states she is doing yoga for 20-30 minutes 5 times per week. Barbara Mcmahon continues to follow her Category 4 plan, but is no longer losing weight. She really feels that she is doing what she should, but she is frustrated she is no longer getting results.  We were unable to weigh the patient today for this TeleHealth visit. She feels as if she has gained 7 lbs since her last visit. She has lost 3 lbs since starting treatment with [name].  Vitamin D Deficiency Barbara Mcmahon has a diagnosis of vitamin D deficiency. She is stable on prescription Vit D and denies nausea, vomiting or muscle weakness.  Pre-Diabetes Barbara Mcmahon has a diagnosis of pre-diabetes based on her elevated Hgb A1c and was informed this puts her at greater risk of developing diabetes. She discontinued Victoza as she felt it wasn't helping and can tell no difference in her hunger. She states she is doing well on her diet prescription and continues to work on exercise to decrease risk of diabetes. She denies nausea or hypoglycemia.  ASSESSMENT AND PLAN:  Vitamin D deficiency - Plan: Vitamin D, Ergocalciferol, (DRISDOL) 1.25 MG (50000 UT) CAPS capsule  Prediabetes  Class 3 severe obesity with serious comorbidity and body mass index (BMI) of 45.0 to 49.9 in adult, unspecified obesity type ([place])  PLAN:  Vitamin D  Deficiency Barbara Mcmahon was informed that low vitamin D levels contributes to fatigue and are associated with obesity, breast, and colon cancer. Tanjanika agrees to continue taking prescription Vit D 50,000 IU every week #4 and we will refill for 1 month. She will follow up for routine testing of vitamin D, at least 2-3 times per year. She was informed of the risk of over-replacement of vitamin D and agrees to not increase her dose unless she discusses this with [name] first. Barbara Mcmahon agrees to follow up with our clinic in 2 weeks.  Pre-Diabetes Barbara Mcmahon will continue to work on weight loss, exercise, and decreasing simple carbohydrates in her diet to help decrease the risk of diabetes. We dicussed metformin including benefits and risks. She was informed that eating too many simple carbohydrates or too many calories at one sitting increases the likelihood of GI side effects. Barbara Mcmahon is to discontinue Victoza and continue her diet plan. We will plan to recheck labs at her next appointment. Barbara Mcmahon agrees to follow up with [name] as directed to monitor her progress.  I spent > than 50% of the 25 minute visit on counseling as documented in the note.  Obesity Barbara Mcmahon is currently in the action stage of change. As such, her goal is to continue with weight loss efforts She has agreed to keep a food journal with 400-600 calories and 40+ grams of protein at supper daily and follow the Category 3 plan Barbara Mcmahon has been instructed to work up to a goal of 150 minutes of combined cardio and strengthening exercise per  week for weight loss and overall health benefits. We discussed the following Behavioral Modification Strategies today: decreasing simple carbohydrates  and ways to avoid night time snacking   Barbara Mcmahon has agreed to follow up with our clinic in 2 weeks. She was informed of the importance of frequent follow up visits to maximize her success with intensive lifestyle modifications for her multiple health  conditions.  ALLERGIES: Allergies  Allergen Reactions   Morphine And Related Other (See Comments), Hives and Rash    Big mood changes BEHAVIOR CHANGES    Shellfish Allergy Hives, Swelling and Anaphylaxis    Throat closes   Codeine Hives and Itching    Pt says she can take percocet   Clonazepam Itching   Cinnamon    Robitussin A-C [Guaifenesin-Codeine] Itching   Tramadol Itching   Yeast-Related Products Hives    MEDICATIONS: Current Outpatient Medications on File Prior to Visit  Medication Sig Dispense Refill   acetaminophen (TYLENOL) 500 MG tablet Take 500 mg by mouth every 6 (six) hours as needed.     cetirizine (ZYRTEC) 10 MG tablet Take 10 mg by mouth daily.     DULoxetine (CYMBALTA) 30 MG capsule TAKE THREE CAPSULES BY MOUTH DAILY 90 capsule 1   ELIQUIS 5 MG TABS tablet TAKE 1 TABLET BY MOUTH TWICE A DAY 180 tablet 3   EPINEPHrine (EPI-PEN) 0.3 mg/0.3 mL SOAJ injection Inject 0.3 mLs (0.3 mg total) into the muscle once as needed (anaphylaxis). 1 Device 1   Insulin Pen Needle (BD PEN NEEDLE NANO 2ND GEN) 32G X 4 MM MISC 1 Package by Does not apply route 2 (two) times daily. 100 each 0   Melatonin 1 MG CAPS Take 1 capsule by mouth at bedtime.     Multiple Vitamin (MULTI-VITAMIN) tablet Take by mouth.     olopatadine (PATANOL) 0.1 % ophthalmic solution      No current facility-administered medications on file prior to visit.     PAST MEDICAL HISTORY: Past Medical History:  Diagnosis Date   Abnormal Pap smear and cervical HPV (human papillomavirus)    Anxiety    Arthritis    BAck and sacral joint   Back pain    Colon polyps    Constipation    Gallbladder problem    Generalized anxiety disorder    IBS (irritable bowel syndrome)    Infertility, female    Joint pain    Multiple food allergies    Shellfish, cinnamon and yeast   Obesity    PCOS (polycystic ovarian syndrome)    Prediabetes    Pulmonary embolism (HCC)    Seizure  disorder (HCC)    Vitamin D deficiency     PAST SURGICAL HISTORY: Past Surgical History:  Procedure Laterality Date   CHOLECYSTECTOMY     COLPOSCOPY     DILATION AND CURETTAGE OF UTERUS     GYNECOLOGIC CRYOSURGERY     KNEE SURGERY  right    SOCIAL HISTORY: Social History   Tobacco Use   Smoking status: Never Smoker   Smokeless tobacco: Never Used  Substance Use Topics   Alcohol use: No   Drug use: No    FAMILY HISTORY: Family History  Problem Relation Age of Onset   Hypertension Father    Depression Father    Anxiety disorder Father    Sleep apnea Father    Obesity Father    Diabetes Paternal Grandmother    Hypertension Paternal Grandmother    Stroke Paternal Grandmother    Diabetes  Paternal Grandfather    Hypertension Paternal Grandfather    Stroke Paternal Grandfather    Hypertension Mother    Depression Mother    Anxiety disorder Mother    Eating disorder Mother     ROS: Review of Systems  Constitutional: Negative for weight loss.  Gastrointestinal: Negative for nausea and vomiting.  Musculoskeletal:       Negative muscle weakness  Endo/Heme/Allergies:       Negative hypoglycemia    PHYSICAL EXAM: Pt in no acute distress  RECENT LABS AND TESTS: BMET    Component Value Date/Time   NA 137 06/17/2019 0804   NA 140 10/30/2018 0953   NA 140 10/11/2017 1122   K 3.9 06/17/2019 0804   K 4.2 10/11/2017 1122   CL 105 06/17/2019 0804   CL 103 10/11/2017 1122   CO2 25 06/17/2019 0804   CO2 29 10/11/2017 1122   GLUCOSE 97 06/17/2019 0804   GLUCOSE 95 10/11/2017 1122   BUN 12 06/17/2019 0804   BUN 10 10/30/2018 0953   BUN 10 10/11/2017 1122   CREATININE 0.74 06/17/2019 0804   CREATININE 0.9 10/11/2017 1122   CALCIUM 8.6 (L) 06/17/2019 0804   CALCIUM 8.7 10/11/2017 1122   GFRNONAA >60 06/17/2019 0804   GFRNONAA >89 10/16/2012 1646   GFRAA >60 06/17/2019 0804   GFRAA >89 10/16/2012 1646   Lab Results  Component Value  Date   HGBA1C 5.8 (H) 10/30/2018   Lab Results  Component Value Date   INSULIN 10.3 10/30/2018   CBC    Component Value Date/Time   WBC 7.7 06/17/2019 0804   WBC 12.2 (H) 12/04/2017 1103   RBC 4.33 06/17/2019 0804   HGB 12.9 06/17/2019 0804   HGB 12.6 10/30/2018 0953   HGB 13.4 10/11/2017 1122   HCT 38.8 06/17/2019 0804   HCT 38.0 10/30/2018 0953   HCT 40.9 10/11/2017 1122   PLT 315 06/17/2019 0804   PLT 291 10/11/2017 1122   MCV 89.6 06/17/2019 0804   MCV 88 10/30/2018 0953   MCV 92 10/11/2017 1122   MCH 29.8 06/17/2019 0804   MCHC 33.2 06/17/2019 0804   RDW 12.9 06/17/2019 0804   RDW 13.2 10/30/2018 0953   RDW 13.3 10/11/2017 1122   LYMPHSABS 2.3 06/17/2019 0804   LYMPHSABS 2.7 10/30/2018 0953   LYMPHSABS 2.8 10/11/2017 1122   MONOABS 0.6 06/17/2019 0804   EOSABS 0.1 06/17/2019 0804   EOSABS 0.1 10/30/2018 0953   EOSABS 0.0 10/11/2017 1122   BASOSABS 0.1 06/17/2019 0804   BASOSABS 0.1 10/30/2018 0953   BASOSABS 0.0 10/11/2017 1122   Iron/TIBC/Ferritin/ %Sat No results found for: IRON, TIBC, FERRITIN, IRONPCTSAT Lipid Panel     Component Value Date/Time   CHOL 182 10/30/2018 0953   TRIG 98 10/30/2018 0953   HDL 56 10/30/2018 0953   LDLCALC 106 (H) 10/30/2018 0953   Hepatic Function Panel     Component Value Date/Time   PROT 6.5 06/17/2019 0804   PROT 6.7 10/30/2018 0953   PROT 6.8 10/11/2017 1122   ALBUMIN 3.6 06/17/2019 0804   ALBUMIN 4.1 10/30/2018 0953   AST 15 06/17/2019 0804   ALT 20 06/17/2019 0804   ALT 36 10/11/2017 1122   ALKPHOS 92 06/17/2019 0804   ALKPHOS 83 10/11/2017 1122   BILITOT 0.4 06/17/2019 0804      Component Value Date/Time   TSH 2.280 10/30/2018 0953   TSH 1.351 01/13/2013 1446      I, Trixie Dredge, am acting as  transcriptionist for Dennard Nip, MD I have reviewed the above documentation for accuracy and completeness, and I agree with the above. -Dennard Nip, MD

## 2019-08-01 ENCOUNTER — Encounter (INDEPENDENT_AMBULATORY_CARE_PROVIDER_SITE_OTHER): Payer: Self-pay | Admitting: Family Medicine

## 2019-08-02 ENCOUNTER — Other Ambulatory Visit: Payer: Self-pay | Admitting: Family Medicine

## 2019-08-02 DIAGNOSIS — M47816 Spondylosis without myelopathy or radiculopathy, lumbar region: Secondary | ICD-10-CM

## 2019-08-03 ENCOUNTER — Other Ambulatory Visit: Payer: Self-pay

## 2019-08-03 ENCOUNTER — Ambulatory Visit (INDEPENDENT_AMBULATORY_CARE_PROVIDER_SITE_OTHER): Payer: BC Managed Care – PPO | Admitting: Psychology

## 2019-08-03 DIAGNOSIS — F3289 Other specified depressive episodes: Secondary | ICD-10-CM

## 2019-08-03 NOTE — Telephone Encounter (Signed)
Please advise 

## 2019-08-13 ENCOUNTER — Other Ambulatory Visit: Payer: Self-pay

## 2019-08-13 ENCOUNTER — Ambulatory Visit (INDEPENDENT_AMBULATORY_CARE_PROVIDER_SITE_OTHER): Payer: BC Managed Care – PPO | Admitting: Family Medicine

## 2019-08-13 ENCOUNTER — Encounter (INDEPENDENT_AMBULATORY_CARE_PROVIDER_SITE_OTHER): Payer: Self-pay | Admitting: Family Medicine

## 2019-08-13 VITALS — BP 139/77 | HR 86 | Temp 97.8°F | Ht 65.0 in | Wt 291.0 lb

## 2019-08-13 DIAGNOSIS — Z6841 Body Mass Index (BMI) 40.0 and over, adult: Secondary | ICD-10-CM

## 2019-08-13 DIAGNOSIS — E559 Vitamin D deficiency, unspecified: Secondary | ICD-10-CM | POA: Diagnosis not present

## 2019-08-13 MED ORDER — VITAMIN D (ERGOCALCIFEROL) 1.25 MG (50000 UNIT) PO CAPS: 50000.0000 [IU] | ORAL_CAPSULE | ORAL | 0 refills | Status: DC

## 2019-08-13 NOTE — Progress Notes (Signed)
Office: [phone]  /  Fax: [phone]   HPI:   Chief Complaint: OBESITY Barbara Mcmahon is here to discuss her progress with her obesity treatment plan. She is on the Category 4 plan and is following her eating plan approximately 50% of the time. She states she is doing yoga 30 minutes 3-4 times per week. Barbara Mcmahon has increased stress eating in the last 2 weeks. Hunger is controlled, but she had increased her wine intake while stressing over the presidential election results.  Her weight is 291 lb (132 kg) today and has had a weight gain of 2 lbs since her last in-officevisit. She has lost 1 lb since starting treatment with [name].  Vitamin D deficiency Barbara Mcmahon has a diagnosis of Vitamin D deficiency, which is not yet at goal. She is currently stable on prescription Vit D and denies nausea, vomiting or muscle weakness.  ASSESSMENT AND PLAN:  Vitamin D deficiency - Plan: Vitamin D, Ergocalciferol, (DRISDOL) 1.25 MG (50000 UT) CAPS capsule  Class 3 severe obesity with serious comorbidity and body mass index (BMI) of 45.0 to 49.9 in adult, unspecified obesity type (Barbara Mcmahon)  PLAN:  Vitamin D Deficiency Barbara Mcmahon was informed that low Vitamin D levels contributes to fatigue and are associated with obesity, breast, and colon cancer. She agrees to continue to take prescription Vit D @ 50,000 IU every week #4 with 0 refills and will follow-up for routine testing of Vitamin D in 1 month. She was informed of the risk of over-replacement of Vitamin D and agrees to not increase her dose unless she discusses this with [name] first. Barbara Mcmahon agrees to follow-up with our clinic in 2 weeks.  I spent > than 50% of the 22 minute visit on counseling as documented in the note.  Obesity Barbara Mcmahon is currently in the action stage of change. As such, her goal is to continue with weight loss efforts. She has agreed to follow the Category 4 plan. Barbara Mcmahon has been instructed to work up to a goal of 150 minutes of  combined cardio and strengthening exercise per week for weight loss and overall health benefits. We discussed the following Behavioral Modification Strategies today: decrease ETOH and emotional eating strategies.  Barbara Mcmahon has agreed to follow-up with our clinic in 2 weeks. She was informed of the importance of frequent follow-up visits to maximize her success with intensive lifestyle modifications for her multiple health conditions.  ALLERGIES: Allergies  Allergen Reactions  . Morphine And Related Other (See Comments), Hives and Rash    Big mood changes BEHAVIOR CHANGES   . Shellfish Allergy Hives, Swelling and Anaphylaxis    Throat closes  . Codeine Hives and Itching    Pt says she can take percocet  . Clonazepam Itching  . Cinnamon   . Robitussin A-C [Guaifenesin-Codeine] Itching  . Tramadol Itching  . Yeast-Related Products Hives    MEDICATIONS: Current Outpatient Medications on File Prior to Visit  Medication Sig Dispense Refill  . acetaminophen (TYLENOL) 500 MG tablet Take 500 mg by mouth every 6 (six) hours as needed.    . cetirizine (ZYRTEC) 10 MG tablet Take 10 mg by mouth daily.    . DULoxetine (CYMBALTA) 30 MG capsule TAKE THREE CAPSULES BY MOUTH DAILY 90 capsule 0  . ELIQUIS 5 MG TABS tablet TAKE 1 TABLET BY MOUTH TWICE A DAY 180 tablet 3  . EPINEPHrine (EPI-PEN) 0.3 mg/0.3 mL SOAJ injection Inject 0.3 mLs (0.3 mg total) into the muscle once as needed (anaphylaxis). 1 Device 1  .  Melatonin 1 MG CAPS Take 1 capsule by mouth at bedtime.    . Multiple Vitamin (MULTI-VITAMIN) tablet Take by mouth.    Barbara Mcmahon Kitchen olopatadine (PATANOL) 0.1 % ophthalmic solution     . Vitamin D, Ergocalciferol, (DRISDOL) 1.25 MG (50000 UT) CAPS capsule Take 1 capsule (50,000 Units total) by mouth every 7 (seven) days. 4 capsule 0   No current facility-administered medications on file prior to visit.     PAST MEDICAL HISTORY: Past Medical History:  Diagnosis Date  . Abnormal Pap smear and  cervical HPV (human papillomavirus)   . Anxiety   . Arthritis    BAck and sacral joint  . Back pain   . Colon polyps   . Constipation   . Gallbladder problem   . Generalized anxiety disorder   . IBS (irritable bowel syndrome)   . Infertility, female   . Joint pain   . Multiple food allergies    Shellfish, cinnamon and yeast  . Obesity   . PCOS (polycystic ovarian syndrome)   . Prediabetes   . Pulmonary embolism ([place])   . Seizure disorder ([place])   . Vitamin D deficiency     PAST SURGICAL HISTORY: Past Surgical History:  Procedure Laterality Date  . CHOLECYSTECTOMY    . COLPOSCOPY    . DILATION AND CURETTAGE OF UTERUS    . GYNECOLOGIC CRYOSURGERY    . KNEE SURGERY  right    SOCIAL HISTORY: Social History   Tobacco Use  . Smoking status: Never Smoker  . Smokeless tobacco: Never Used  Substance Use Topics  . Alcohol use: No  . Drug use: No    FAMILY HISTORY: Family History  Problem Relation Age of Onset  . Hypertension Father   . Depression Father   . Anxiety disorder Father   . Sleep apnea Father   . Obesity Father   . Diabetes Paternal Grandmother   . Hypertension Paternal Grandmother   . Stroke Paternal Grandmother   . Diabetes Paternal Grandfather   . Hypertension Paternal Grandfather   . Stroke Paternal Grandfather   . Hypertension Mother   . Depression Mother   . Anxiety disorder Mother   . Eating disorder Mother    ROS: Review of Systems  Gastrointestinal: Negative for nausea and vomiting.  Musculoskeletal:       Negative for muscle weakness.   PHYSICAL EXAM: Blood pressure 139/77, pulse 86, temperature 97.8 F (36.6 C), temperature source Oral, height 5\' 5"  (1.651 m), weight 291 lb (132 kg), SpO2 97 %. Body mass index is 48.42 kg/m. Physical Exam Vitals signs reviewed.  Constitutional:      Appearance: Normal appearance. She is obese.  Cardiovascular:     Rate and Rhythm: Normal rate.     Pulses: Normal pulses.  Pulmonary:      Effort: Pulmonary effort is normal.     Breath sounds: Normal breath sounds.  Musculoskeletal: Normal range of motion.  Skin:    General: Skin is warm and dry.  Neurological:     Mental Status: She is alert and oriented to person, place, and time.  Psychiatric:        Behavior: Behavior normal.   RECENT LABS AND TESTS: BMET    Component Value Date/Time   NA 137 06/17/2019 0804   NA 140 10/30/2018 0953   NA 140 10/11/2017 1122   K 3.9 06/17/2019 0804   K 4.2 10/11/2017 1122   CL 105 06/17/2019 0804   CL 103 10/11/2017 1122  CO2 25 06/17/2019 0804   CO2 29 10/11/2017 1122   GLUCOSE 97 06/17/2019 0804   GLUCOSE 95 10/11/2017 1122   BUN 12 06/17/2019 0804   BUN 10 10/30/2018 0953   BUN 10 10/11/2017 1122   CREATININE 0.74 06/17/2019 0804   CREATININE 0.9 10/11/2017 1122   CALCIUM 8.6 (L) 06/17/2019 0804   CALCIUM 8.7 10/11/2017 1122   GFRNONAA >60 06/17/2019 0804   GFRNONAA >89 10/16/2012 1646   GFRAA >60 06/17/2019 0804   GFRAA >89 10/16/2012 1646   Lab Results  Component Value Date   HGBA1C 5.8 (H) 10/30/2018   Lab Results  Component Value Date   INSULIN 10.3 10/30/2018   CBC    Component Value Date/Time   WBC 7.7 06/17/2019 0804   WBC 12.2 (H) 12/04/2017 1103   RBC 4.33 06/17/2019 0804   HGB 12.9 06/17/2019 0804   HGB 12.6 10/30/2018 0953   HGB 13.4 10/11/2017 1122   HCT 38.8 06/17/2019 0804   HCT 38.0 10/30/2018 0953   HCT 40.9 10/11/2017 1122   PLT 315 06/17/2019 0804   PLT 291 10/11/2017 1122   MCV 89.6 06/17/2019 0804   MCV 88 10/30/2018 0953   MCV 92 10/11/2017 1122   MCH 29.8 06/17/2019 0804   MCHC 33.2 06/17/2019 0804   RDW 12.9 06/17/2019 0804   RDW 13.2 10/30/2018 0953   RDW 13.3 10/11/2017 1122   LYMPHSABS 2.3 06/17/2019 0804   LYMPHSABS 2.7 10/30/2018 0953   LYMPHSABS 2.8 10/11/2017 1122   MONOABS 0.6 06/17/2019 0804   EOSABS 0.1 06/17/2019 0804   EOSABS 0.1 10/30/2018 0953   EOSABS 0.0 10/11/2017 1122   BASOSABS 0.1 06/17/2019 0804    BASOSABS 0.1 10/30/2018 0953   BASOSABS 0.0 10/11/2017 1122   Iron/TIBC/Ferritin/ %Sat No results found for: IRON, TIBC, FERRITIN, IRONPCTSAT Lipid Panel     Component Value Date/Time   CHOL 182 10/30/2018 0953   TRIG 98 10/30/2018 0953   HDL 56 10/30/2018 0953   LDLCALC 106 (H) 10/30/2018 0953   Hepatic Function Panel     Component Value Date/Time   PROT 6.5 06/17/2019 0804   PROT 6.7 10/30/2018 0953   PROT 6.8 10/11/2017 1122   ALBUMIN 3.6 06/17/2019 0804   ALBUMIN 4.1 10/30/2018 0953   AST 15 06/17/2019 0804   ALT 20 06/17/2019 0804   ALT 36 10/11/2017 1122   ALKPHOS 92 06/17/2019 0804   ALKPHOS 83 10/11/2017 1122   BILITOT 0.4 06/17/2019 0804      Component Value Date/Time   TSH 2.280 10/30/2018 0953   TSH 1.351 01/13/2013 1446   Results for AVIANA, ZIERKE (MRN [id number]) as of 08/13/2019 10:52  Ref. Range 10/30/2018 09:53  Vitamin D, 25-Hydroxy Latest Ref Range: 30.0 - 100.0 ng/mL 19.6 (L)   OBESITY BEHAVIORAL INTERVENTION VISIT  Today's visit was #17  Starting weight: 292 lbs Starting date: 10/30/2018 Today's weight: 291 lbs  Today's date: 08/13/2019 Total lbs lost to date: 1    08/13/2019  Height 5\' 5"  (1.651 m)  Weight 291 lb (132 kg)  BMI (Calculated) 48.43  BLOOD PRESSURE - SYSTOLIC [id number]  BLOOD PRESSURE - DIASTOLIC 77   Body Fat % [id number] %  Total Body Water (lbs) 97 lbs   ASK: We discussed the diagnosis of obesity with Barbara Mcmahon today and Barbara Mcmahon agreed to give [name] permission to discuss obesity behavioral modification therapy today.  ASSESS: Barbara Mcmahon has the diagnosis of obesity and her BMI today is 48.6. Barbara Mcmahon is in the action  stage of change.   ADVISE: Barbara Mcmahon was educated on the multiple health risks of obesity as well as the benefit of weight loss to improve her health. She was advised of the need for long term treatment and the importance of lifestyle modifications to improve her current health and to decrease her risk of  future health problems.  AGREE: Multiple dietary modification options and treatment options were discussed and  Barbara Mcmahon agreed to follow the recommendations documented in the above note.  ARRANGE: Barbara Mcmahon was educated on the importance of frequent visits to treat obesity as outlined per CMS and USPSTF guidelines and agreed to schedule her next follow up appointment today.  I, Michaelene Song, am acting as Location manager for Dennard Nip, MD   I have reviewed the above documentation for accuracy and completeness, and I agree with the above. -Dennard Nip, MD

## 2019-08-15 ENCOUNTER — Encounter: Payer: Self-pay | Admitting: Hematology & Oncology

## 2019-08-17 ENCOUNTER — Other Ambulatory Visit: Payer: Self-pay | Admitting: *Deleted

## 2019-08-17 DIAGNOSIS — R0602 Shortness of breath: Secondary | ICD-10-CM

## 2019-08-17 MED ORDER — APIXABAN 5 MG PO TABS: 5.0000 mg | ORAL_TABLET | Freq: Two times a day (BID) | ORAL | 3 refills | Status: DC

## 2019-08-20 ENCOUNTER — Ambulatory Visit (INDEPENDENT_AMBULATORY_CARE_PROVIDER_SITE_OTHER): Payer: BC Managed Care – PPO | Admitting: Family Medicine

## 2019-08-20 ENCOUNTER — Encounter: Payer: Self-pay | Admitting: Family Medicine

## 2019-08-20 VITALS — Temp 98.9°F | Ht 65.0 in | Wt 287.0 lb

## 2019-08-20 DIAGNOSIS — J019 Acute sinusitis, unspecified: Secondary | ICD-10-CM | POA: Diagnosis not present

## 2019-08-20 MED ORDER — AMOXICILLIN-POT CLAVULANATE 875-125 MG PO TABS: 1.0000 | ORAL_TABLET | Freq: Two times a day (BID) | ORAL | 0 refills | Status: DC

## 2019-08-20 NOTE — Progress Notes (Signed)
Virtual Visit via Video Note  I connected with Barbara Mcmahon on 08/20/19 at 10:10 AM EST by a video enabled telemedicine application and verified that I am speaking with the correct person using two identifiers.   I discussed the limitations of evaluation and management by telemedicine and the availability of in person appointments. The patient expressed understanding and agreed to proceed.  Subjective:    CC: Minus symptoms.  HPI: 43 yo female c/o of about 6 days of sinus symptoms.  She complains of left head pressure and left ear pressure.  She is had a lot of drainage down the back of her throat she had a persistent headache.  Her symptoms feel worse at night.  She has not tried any over-the-counter medications.  No loss of taste or smell.  No known Covid exposures.  No GI symptoms such as diarrhea or nausea.  She wonders if a decongestant would be helpful.  She does have history of recurrent sinus infections.  Best if her facial pain or pressure is right below her left eye and over her eyebrow ridge.  No fevers, chills or sweats.  No worsening or alleviating factors.  But she said she finally called today because she could barely sleep last night because of the pressure in her face.   Past medical history, Surgical history, Family history not pertinant except as noted below, Social history, Allergies, and medications have been entered into the medical record, reviewed, and corrections made.   Review of Systems: No fevers, chills, night sweats, weight loss, chest pain, or shortness of breath.   Objective:    General: Speaking clearly in complete sentences without any shortness of breath.  Alert and oriented x3.  Normal judgment. No apparent acute distress.    Impression and Recommendations:   Cute sinusitis, left maxillary-we will go ahead and treat with Augmentin.  Recommend nasal saline instead of a decongestant which can sometimes dry out secretions.  If not improved in 1 week then  please give [name] call back.  If she develops any new symptoms or chest symptoms then please let us know.     I discussed the assessment and treatment plan with the patient. The patient was provided an opportunity to ask questions and all were answered. The patient agreed with the plan and demonstrated an understanding of the instructions.   The patient was advised to call back or seek an in-person evaluation if the symptoms worsen or if the condition fails to improve as anticipated.   Beatrice Lecher, MD

## 2019-08-20 NOTE — Progress Notes (Signed)
Symptoms started around last weekend, worse last night.  Left head pressure/ Headache Draining down back of throat Ear pressure on left  Hasn't tried anything over the counter.

## 2019-08-24 ENCOUNTER — Encounter: Payer: Self-pay | Admitting: Hematology & Oncology

## 2019-08-27 ENCOUNTER — Encounter (INDEPENDENT_AMBULATORY_CARE_PROVIDER_SITE_OTHER): Payer: Self-pay | Admitting: Family Medicine

## 2019-08-27 ENCOUNTER — Encounter: Payer: Self-pay | Admitting: Family Medicine

## 2019-08-27 ENCOUNTER — Ambulatory Visit (INDEPENDENT_AMBULATORY_CARE_PROVIDER_SITE_OTHER): Payer: BC Managed Care – PPO | Admitting: Family Medicine

## 2019-08-27 ENCOUNTER — Other Ambulatory Visit: Payer: Self-pay

## 2019-08-27 VITALS — BP 141/68 | HR 85 | Temp 97.7°F | Ht 65.0 in | Wt 291.0 lb

## 2019-08-27 DIAGNOSIS — R030 Elevated blood-pressure reading, without diagnosis of hypertension: Secondary | ICD-10-CM | POA: Diagnosis not present

## 2019-08-27 DIAGNOSIS — F418 Other specified anxiety disorders: Secondary | ICD-10-CM

## 2019-08-27 DIAGNOSIS — Z9189 Other specified personal risk factors, not elsewhere classified: Secondary | ICD-10-CM

## 2019-08-27 DIAGNOSIS — Z6841 Body Mass Index (BMI) 40.0 and over, adult: Secondary | ICD-10-CM

## 2019-08-27 MED ORDER — TOPIRAMATE 50 MG PO TABS: 50.0000 mg | ORAL_TABLET | Freq: Every day | ORAL | 0 refills | Status: DC

## 2019-08-28 MED ORDER — FLUCONAZOLE 150 MG PO TABS: 150.0000 mg | ORAL_TABLET | Freq: Once | ORAL | 0 refills | Status: AC

## 2019-08-28 NOTE — Telephone Encounter (Signed)
RX pended 

## 2019-09-01 DIAGNOSIS — M9904 Segmental and somatic dysfunction of sacral region: Secondary | ICD-10-CM | POA: Diagnosis not present

## 2019-09-01 DIAGNOSIS — M4727 Other spondylosis with radiculopathy, lumbosacral region: Secondary | ICD-10-CM | POA: Diagnosis not present

## 2019-09-01 DIAGNOSIS — M9903 Segmental and somatic dysfunction of lumbar region: Secondary | ICD-10-CM | POA: Diagnosis not present

## 2019-09-01 DIAGNOSIS — S336XXA Sprain of sacroiliac joint, initial encounter: Secondary | ICD-10-CM | POA: Diagnosis not present

## 2019-09-01 NOTE — Progress Notes (Signed)
Office: [phone]  /  Fax: [phone]   HPI:   Chief Complaint: OBESITY Barbara Mcmahon is here to discuss her progress with her obesity treatment plan. She is on the Category 4 plan and is following her eating plan approximately 90 % of the time. She states she is doing yoga for 30 minutes 1-2 times per week. Barbara Mcmahon has done well maintaining her weight. She is making plans for Thanksgiving and would like to discuss holiday eating strategies.  Her weight is 291 lb (132 kg) today and has not lost weight since her last visit. She has lost 1 lb since starting treatment with [name].  Elevated Blood Pressure without History of Hypertension Barbara Mcmahon's blood pressure is elevated today. She notes her anxiety has increased lately and she feels this is related.  At risk for cardiovascular disease Barbara Mcmahon is at a higher than average risk for cardiovascular disease due to obesity and elevated blood pressure. She currently denies any chest pain.  Depression with Anxiety Barbara Mcmahon is on Cymbalta for depression and back pain, but she notes her anxiety has worsened in the last month. She shows no sign of suicidal or homicidal ideations.  ASSESSMENT AND PLAN:  Blood pressure elevated without history of HTN  Depression with anxiety - Plan: topiramate (TOPAMAX) 50 MG tablet  At risk for heart disease  Class 3 severe obesity with serious comorbidity and body mass index (BMI) of 45.0 to 49.9 in adult, unspecified obesity type ([place])  PLAN:  Elevated Blood Pressure without History of Hypertension We will recheck her blood pressure in 3 to 4 weeks. Barbara Mcmahon is to continue working on diet and exercise in the meanwhile.  Cardiovascular risk counseling Barbara Mcmahon was given extended (15 minutes) coronary artery disease prevention counseling today. She is 43 y.o. female and has risk factors for heart disease including obesity and elevated blood pressure. We discussed intensive lifestyle modifications  today with an emphasis on specific weight loss instructions and strategies. Pt was also informed of the importance of increasing exercise and decreasing saturated fats to help prevent heart disease.  Depression with Anxiety We discussed behavior modification techniques today to help Barbara Mcmahon deal with her depression and anxiety. Barbara Mcmahon agrees to start Topamax 50 mg PO qhs #30 with no refills, and she agrees to continue Cymbalta as is. Barbara Mcmahon agrees to follow up with our clinic in 3 to 4 weeks.  Obesity Barbara Mcmahon is currently in the action stage of change. As such, her goal is to continue with weight loss efforts She has agreed to follow the Category 4 plan Barbara Mcmahon has been instructed to work up to a goal of 150 minutes of combined cardio and strengthening exercise per week for weight loss and overall health benefits. We discussed the following Behavioral Modification Strategies today: decreasing sodium intake and holiday eating strategies    Barbara Mcmahon has agreed to follow up with our clinic in 3 to 4 weeks. She was informed of the importance of frequent follow up visits to maximize her success with intensive lifestyle modifications for her multiple health conditions.  ALLERGIES: Allergies  Allergen Reactions  . Morphine And Related Other (See Comments), Hives and Rash    Big mood changes BEHAVIOR CHANGES   . Shellfish Allergy Hives, Swelling and Anaphylaxis    Throat closes  . Codeine Hives and Itching    Pt says she can take percocet  . Clonazepam Itching  . Cinnamon   . Robitussin A-C [Guaifenesin-Codeine] Itching  . Tramadol Itching  . Yeast-Related Products Hives  MEDICATIONS: Current Outpatient Medications on File Prior to Visit  Medication Sig Dispense Refill  . acetaminophen (TYLENOL) 500 MG tablet Take 500 mg by mouth every 6 (six) hours as needed.    Marland Kitchen amoxicillin-clavulanate (AUGMENTIN) 875-125 MG tablet Take 1 tablet by mouth 2 (two) times daily. 14 tablet 0   . apixaban (ELIQUIS) 5 MG TABS tablet Take 1 tablet (5 mg total) by mouth 2 (two) times daily. 180 tablet 3  . cetirizine (ZYRTEC) 10 MG tablet Take 10 mg by mouth daily.    Marland Kitchen CRANBERRY PO Take by mouth daily.    . DULoxetine (CYMBALTA) 30 MG capsule TAKE THREE CAPSULES BY MOUTH DAILY 90 capsule 0  . EPINEPHrine (EPI-PEN) 0.3 mg/0.3 mL SOAJ injection Inject 0.3 mLs (0.3 mg total) into the muscle once as needed (anaphylaxis). 1 Device 1  . Melatonin 1 MG CAPS Take 1 capsule by mouth at bedtime.    . Multiple Vitamin (MULTI-VITAMIN) tablet Take by mouth.    . Vitamin D, Ergocalciferol, (DRISDOL) 1.25 MG (50000 UT) CAPS capsule Take 1 capsule (50,000 Units total) by mouth every 7 (seven) days. 4 capsule 0   No current facility-administered medications on file prior to visit.     PAST MEDICAL HISTORY: Past Medical History:  Diagnosis Date  . Abnormal Pap smear and cervical HPV (human papillomavirus)   . Anxiety   . Arthritis    BAck and sacral joint  . Back pain   . Colon polyps   . Constipation   . Gallbladder problem   . Generalized anxiety disorder   . IBS (irritable bowel syndrome)   . Infertility, female   . Joint pain   . Multiple food allergies    Shellfish, cinnamon and yeast  . Obesity   . PCOS (polycystic ovarian syndrome)   . Prediabetes   . Pulmonary embolism ([place])   . Seizure disorder ([place])   . Vitamin D deficiency     PAST SURGICAL HISTORY: Past Surgical History:  Procedure Laterality Date  . CHOLECYSTECTOMY    . COLPOSCOPY    . DILATION AND CURETTAGE OF UTERUS    . GYNECOLOGIC CRYOSURGERY    . KNEE SURGERY  right    SOCIAL HISTORY: Social History   Tobacco Use  . Smoking status: Never Smoker  . Smokeless tobacco: Never Used  Substance Use Topics  . Alcohol use: No  . Drug use: No    FAMILY HISTORY: Family History  Problem Relation Age of Onset  . Hypertension Father   . Depression Father   . Anxiety disorder Father   . Sleep apnea Father    . Obesity Father   . Diabetes Paternal Grandmother   . Hypertension Paternal Grandmother   . Stroke Paternal Grandmother   . Diabetes Paternal Grandfather   . Hypertension Paternal Grandfather   . Stroke Paternal Grandfather   . Hypertension Mother   . Depression Mother   . Anxiety disorder Mother   . Eating disorder Mother     ROS: Review of Systems  Constitutional: Negative for weight loss.  Cardiovascular: Negative for chest pain.  Psychiatric/Behavioral: Positive for depression. Negative for suicidal ideas.       + Anxiety    PHYSICAL EXAM: Blood pressure (!) 141/68, pulse 85, temperature 97.7 F (36.5 C), temperature source Oral, height 5\' 5"  (1.651 m), weight 291 lb (132 kg), SpO2 98 %. Body mass index is 48.42 kg/m. Physical Exam Vitals signs reviewed.  Constitutional:      Appearance: Normal  appearance. She is obese.  Cardiovascular:     Rate and Rhythm: Normal rate.     Pulses: Normal pulses.  Pulmonary:     Effort: Pulmonary effort is normal.     Breath sounds: Normal breath sounds.  Musculoskeletal: Normal range of motion.  Skin:    General: Skin is warm and dry.  Neurological:     Mental Status: She is alert and oriented to person, place, and time.  Psychiatric:        Mood and Affect: Mood normal.        Behavior: Behavior normal.     RECENT LABS AND TESTS: BMET    Component Value Date/Time   NA 137 06/17/2019 0804   NA 140 10/30/2018 0953   NA 140 10/11/2017 1122   K 3.9 06/17/2019 0804   K 4.2 10/11/2017 1122   CL 105 06/17/2019 0804   CL 103 10/11/2017 1122   CO2 25 06/17/2019 0804   CO2 29 10/11/2017 1122   GLUCOSE 97 06/17/2019 0804   GLUCOSE 95 10/11/2017 1122   BUN 12 06/17/2019 0804   BUN 10 10/30/2018 0953   BUN 10 10/11/2017 1122   CREATININE 0.74 06/17/2019 0804   CREATININE 0.9 10/11/2017 1122   CALCIUM 8.6 (L) 06/17/2019 0804   CALCIUM 8.7 10/11/2017 1122   GFRNONAA >60 06/17/2019 0804   GFRNONAA >89 10/16/2012 1646    GFRAA >60 06/17/2019 0804   GFRAA >89 10/16/2012 1646   Lab Results  Component Value Date   HGBA1C 5.8 (H) 10/30/2018   Lab Results  Component Value Date   INSULIN 10.3 10/30/2018   CBC    Component Value Date/Time   WBC 7.7 06/17/2019 0804   WBC 12.2 (H) 12/04/2017 1103   RBC 4.33 06/17/2019 0804   HGB 12.9 06/17/2019 0804   HGB 12.6 10/30/2018 0953   HGB 13.4 10/11/2017 1122   HCT 38.8 06/17/2019 0804   HCT 38.0 10/30/2018 0953   HCT 40.9 10/11/2017 1122   PLT 315 06/17/2019 0804   PLT 291 10/11/2017 1122   MCV 89.6 06/17/2019 0804   MCV 88 10/30/2018 0953   MCV 92 10/11/2017 1122   MCH 29.8 06/17/2019 0804   MCHC 33.2 06/17/2019 0804   RDW 12.9 06/17/2019 0804   RDW 13.2 10/30/2018 0953   RDW 13.3 10/11/2017 1122   LYMPHSABS 2.3 06/17/2019 0804   LYMPHSABS 2.7 10/30/2018 0953   LYMPHSABS 2.8 10/11/2017 1122   MONOABS 0.6 06/17/2019 0804   EOSABS 0.1 06/17/2019 0804   EOSABS 0.1 10/30/2018 0953   EOSABS 0.0 10/11/2017 1122   BASOSABS 0.1 06/17/2019 0804   BASOSABS 0.1 10/30/2018 0953   BASOSABS 0.0 10/11/2017 1122   Iron/TIBC/Ferritin/ %Sat No results found for: IRON, TIBC, FERRITIN, IRONPCTSAT Lipid Panel     Component Value Date/Time   CHOL 182 10/30/2018 0953   TRIG 98 10/30/2018 0953   HDL 56 10/30/2018 0953   LDLCALC 106 (H) 10/30/2018 0953   Hepatic Function Panel     Component Value Date/Time   PROT 6.5 06/17/2019 0804   PROT 6.7 10/30/2018 0953   PROT 6.8 10/11/2017 1122   ALBUMIN 3.6 06/17/2019 0804   ALBUMIN 4.1 10/30/2018 0953   AST 15 06/17/2019 0804   ALT 20 06/17/2019 0804   ALT 36 10/11/2017 1122   ALKPHOS 92 06/17/2019 0804   ALKPHOS 83 10/11/2017 1122   BILITOT 0.4 06/17/2019 0804      Component Value Date/Time   TSH 2.280 10/30/2018 [id number]  TSH 1.351 01/13/2013 1446      OBESITY BEHAVIORAL INTERVENTION VISIT  Today's visit was # 18   Starting weight: 292 lbs Starting date: 10/30/2018 Today's weight : 291 lbs Today's  date: 08/27/2019 Total lbs lost to date: 1    ASK: We discussed the diagnosis of obesity with Barbara Mcmahon today and [place] agreed to give [name] permission to discuss obesity behavioral modification therapy today.  ASSESS: Barbara Mcmahon has the diagnosis of obesity and her BMI today is 48.43 Barbara Mcmahon is in the action stage of change   ADVISE: Barbara Mcmahon was educated on the multiple health risks of obesity as well as the benefit of weight loss to improve her health. She was advised of the need for long term treatment and the importance of lifestyle modifications to improve her current health and to decrease her risk of future health problems.  AGREE: Multiple dietary modification options and treatment options were discussed and  Barbara Mcmahon agreed to follow the recommendations documented in the above note.  ARRANGE: Barbara Mcmahon was educated on the importance of frequent visits to treat obesity as outlined per CMS and USPSTF guidelines and agreed to schedule her next follow up appointment today.  I, Trixie Dredge, am acting as transcriptionist for Dennard Nip, MD I have reviewed the above documentation for accuracy and completeness, and I agree with the above. -Dennard Nip, MD

## 2019-09-02 ENCOUNTER — Other Ambulatory Visit: Payer: Self-pay | Admitting: Family Medicine

## 2019-09-02 DIAGNOSIS — M47816 Spondylosis without myelopathy or radiculopathy, lumbar region: Secondary | ICD-10-CM

## 2019-09-07 DIAGNOSIS — M9903 Segmental and somatic dysfunction of lumbar region: Secondary | ICD-10-CM | POA: Diagnosis not present

## 2019-09-07 DIAGNOSIS — S336XXA Sprain of sacroiliac joint, initial encounter: Secondary | ICD-10-CM | POA: Diagnosis not present

## 2019-09-07 DIAGNOSIS — M9904 Segmental and somatic dysfunction of sacral region: Secondary | ICD-10-CM | POA: Diagnosis not present

## 2019-09-07 DIAGNOSIS — M4727 Other spondylosis with radiculopathy, lumbosacral region: Secondary | ICD-10-CM | POA: Diagnosis not present

## 2019-09-08 DIAGNOSIS — M4727 Other spondylosis with radiculopathy, lumbosacral region: Secondary | ICD-10-CM | POA: Diagnosis not present

## 2019-09-08 DIAGNOSIS — M9904 Segmental and somatic dysfunction of sacral region: Secondary | ICD-10-CM | POA: Diagnosis not present

## 2019-09-08 DIAGNOSIS — M9903 Segmental and somatic dysfunction of lumbar region: Secondary | ICD-10-CM | POA: Diagnosis not present

## 2019-09-08 DIAGNOSIS — S336XXA Sprain of sacroiliac joint, initial encounter: Secondary | ICD-10-CM | POA: Diagnosis not present

## 2019-09-11 ENCOUNTER — Encounter (INDEPENDENT_AMBULATORY_CARE_PROVIDER_SITE_OTHER): Payer: Self-pay | Admitting: Family Medicine

## 2019-09-14 DIAGNOSIS — S336XXA Sprain of sacroiliac joint, initial encounter: Secondary | ICD-10-CM | POA: Diagnosis not present

## 2019-09-14 DIAGNOSIS — M9903 Segmental and somatic dysfunction of lumbar region: Secondary | ICD-10-CM | POA: Diagnosis not present

## 2019-09-14 DIAGNOSIS — M4727 Other spondylosis with radiculopathy, lumbosacral region: Secondary | ICD-10-CM | POA: Diagnosis not present

## 2019-09-14 DIAGNOSIS — M9904 Segmental and somatic dysfunction of sacral region: Secondary | ICD-10-CM | POA: Diagnosis not present

## 2019-09-15 DIAGNOSIS — M4727 Other spondylosis with radiculopathy, lumbosacral region: Secondary | ICD-10-CM | POA: Diagnosis not present

## 2019-09-15 DIAGNOSIS — M9904 Segmental and somatic dysfunction of sacral region: Secondary | ICD-10-CM | POA: Diagnosis not present

## 2019-09-15 DIAGNOSIS — S336XXA Sprain of sacroiliac joint, initial encounter: Secondary | ICD-10-CM | POA: Diagnosis not present

## 2019-09-15 DIAGNOSIS — M9903 Segmental and somatic dysfunction of lumbar region: Secondary | ICD-10-CM | POA: Diagnosis not present

## 2019-09-16 DIAGNOSIS — M4727 Other spondylosis with radiculopathy, lumbosacral region: Secondary | ICD-10-CM | POA: Diagnosis not present

## 2019-09-16 DIAGNOSIS — M9903 Segmental and somatic dysfunction of lumbar region: Secondary | ICD-10-CM | POA: Diagnosis not present

## 2019-09-16 DIAGNOSIS — S336XXA Sprain of sacroiliac joint, initial encounter: Secondary | ICD-10-CM | POA: Diagnosis not present

## 2019-09-16 DIAGNOSIS — M9904 Segmental and somatic dysfunction of sacral region: Secondary | ICD-10-CM | POA: Diagnosis not present

## 2019-09-17 DIAGNOSIS — S336XXA Sprain of sacroiliac joint, initial encounter: Secondary | ICD-10-CM | POA: Diagnosis not present

## 2019-09-17 DIAGNOSIS — M9904 Segmental and somatic dysfunction of sacral region: Secondary | ICD-10-CM | POA: Diagnosis not present

## 2019-09-17 DIAGNOSIS — M4727 Other spondylosis with radiculopathy, lumbosacral region: Secondary | ICD-10-CM | POA: Diagnosis not present

## 2019-09-17 DIAGNOSIS — M9903 Segmental and somatic dysfunction of lumbar region: Secondary | ICD-10-CM | POA: Diagnosis not present

## 2019-09-21 DIAGNOSIS — M9904 Segmental and somatic dysfunction of sacral region: Secondary | ICD-10-CM | POA: Diagnosis not present

## 2019-09-21 DIAGNOSIS — M9903 Segmental and somatic dysfunction of lumbar region: Secondary | ICD-10-CM | POA: Diagnosis not present

## 2019-09-21 DIAGNOSIS — M4727 Other spondylosis with radiculopathy, lumbosacral region: Secondary | ICD-10-CM | POA: Diagnosis not present

## 2019-09-21 DIAGNOSIS — S336XXA Sprain of sacroiliac joint, initial encounter: Secondary | ICD-10-CM | POA: Diagnosis not present

## 2019-09-24 ENCOUNTER — Other Ambulatory Visit: Payer: Self-pay

## 2019-09-24 ENCOUNTER — Ambulatory Visit (INDEPENDENT_AMBULATORY_CARE_PROVIDER_SITE_OTHER): Payer: BC Managed Care – PPO | Admitting: Family Medicine

## 2019-09-24 ENCOUNTER — Encounter (INDEPENDENT_AMBULATORY_CARE_PROVIDER_SITE_OTHER): Payer: Self-pay | Admitting: Family Medicine

## 2019-09-24 VITALS — BP 138/70 | HR 89 | Temp 98.1°F | Ht 65.0 in | Wt 290.0 lb

## 2019-09-24 DIAGNOSIS — E559 Vitamin D deficiency, unspecified: Secondary | ICD-10-CM

## 2019-09-24 DIAGNOSIS — Z9189 Other specified personal risk factors, not elsewhere classified: Secondary | ICD-10-CM

## 2019-09-24 DIAGNOSIS — R7303 Prediabetes: Secondary | ICD-10-CM | POA: Diagnosis not present

## 2019-09-24 DIAGNOSIS — Z6841 Body Mass Index (BMI) 40.0 and over, adult: Secondary | ICD-10-CM

## 2019-09-24 DIAGNOSIS — F3289 Other specified depressive episodes: Secondary | ICD-10-CM | POA: Diagnosis not present

## 2019-09-24 DIAGNOSIS — E7849 Other hyperlipidemia: Secondary | ICD-10-CM

## 2019-09-24 MED ORDER — TOPIRAMATE 50 MG PO TABS: 50.0000 mg | ORAL_TABLET | Freq: Every day | ORAL | 0 refills | Status: DC

## 2019-09-25 ENCOUNTER — Encounter (INDEPENDENT_AMBULATORY_CARE_PROVIDER_SITE_OTHER): Payer: Self-pay | Admitting: Family Medicine

## 2019-09-25 LAB — COMPREHENSIVE METABOLIC PANEL
ALT: 17 IU/L (ref 0–32)
AST: 19 IU/L (ref 0–40)
Albumin/Globulin Ratio: 1.6 (ref 1.2–2.2)
Albumin: 4.2 g/dL (ref 3.8–4.8)
Alkaline Phosphatase: 110 IU/L (ref 39–117)
BUN/Creatinine Ratio: 21 (ref 9–23)
BUN: 14 mg/dL (ref 6–24)
Bilirubin Total: 0.3 mg/dL (ref 0.0–1.2)
CO2: 21 mmol/L (ref 20–29)
Calcium: 8.9 mg/dL (ref 8.7–10.2)
Chloride: 106 mmol/L (ref 96–106)
Creatinine, Ser: 0.67 mg/dL (ref 0.57–1.00)
GFR calc Af Amer: 124 mL/min/{1.73_m2} (ref >59)
GFR calc non Af Amer: 107 mL/min/{1.73_m2} (ref >59)
Globulin, Total: 2.6 g/dL (ref 1.5–4.5)
Glucose: 109 mg/dL — ABNORMAL HIGH (ref 65–99)
Potassium: 4.1 mmol/L (ref 3.5–5.2)
Sodium: 139 mmol/L (ref 134–144)
Total Protein: 6.8 g/dL (ref 6.0–8.5)

## 2019-09-25 LAB — HEMOGLOBIN A1C
Est. average glucose Bld gHb Est-mCnc: 123 mg/dL
Hgb A1c MFr Bld: 5.9 % — ABNORMAL HIGH (ref 4.8–5.6)

## 2019-09-25 LAB — INSULIN, RANDOM: INSULIN: 17 u[IU]/mL (ref 2.6–24.9)

## 2019-09-25 LAB — LIPID PANEL WITH LDL/HDL RATIO
Cholesterol, Total: 186 mg/dL (ref 100–199)
HDL: 46 mg/dL (ref >39)
LDL Chol Calc (NIH): 119 mg/dL — ABNORMAL HIGH (ref 0–99)
LDL/HDL Ratio: 2.6 ratio (ref 0.0–3.2)
Triglycerides: 118 mg/dL (ref 0–149)
VLDL Cholesterol Cal: 21 mg/dL (ref 5–40)

## 2019-09-25 LAB — VITAMIN D 25 HYDROXY (VIT D DEFICIENCY, FRACTURES): Vit D, 25-Hydroxy: 22.6 ng/mL — ABNORMAL LOW (ref 30.0–100.0)

## 2019-09-28 DIAGNOSIS — M609 Myositis, unspecified: Secondary | ICD-10-CM | POA: Diagnosis not present

## 2019-09-28 DIAGNOSIS — M47817 Spondylosis without myelopathy or radiculopathy, lumbosacral region: Secondary | ICD-10-CM | POA: Diagnosis not present

## 2019-09-28 DIAGNOSIS — M5137 Other intervertebral disc degeneration, lumbosacral region: Secondary | ICD-10-CM | POA: Diagnosis not present

## 2019-09-28 DIAGNOSIS — M5387 Other specified dorsopathies, lumbosacral region: Secondary | ICD-10-CM | POA: Diagnosis not present

## 2019-09-28 NOTE — Telephone Encounter (Signed)
Please review

## 2019-09-29 DIAGNOSIS — M609 Myositis, unspecified: Secondary | ICD-10-CM | POA: Diagnosis not present

## 2019-09-29 DIAGNOSIS — M5387 Other specified dorsopathies, lumbosacral region: Secondary | ICD-10-CM | POA: Diagnosis not present

## 2019-09-29 DIAGNOSIS — M47817 Spondylosis without myelopathy or radiculopathy, lumbosacral region: Secondary | ICD-10-CM | POA: Diagnosis not present

## 2019-09-29 DIAGNOSIS — M5137 Other intervertebral disc degeneration, lumbosacral region: Secondary | ICD-10-CM | POA: Diagnosis not present

## 2019-09-29 NOTE — Progress Notes (Signed)
Office: [phone]  /  Fax: [phone]   HPI:  Chief Complaint: OBESITY Barbara Mcmahon is here to discuss her progress with her obesity treatment plan. She is on the Category 4 plan and states she is following her eating plan approximately 80 % of the time. She states she is exercising 0 minutes 0 times per week.  Barbara Mcmahon has done very well with her eating plan and has lost 1 lb even in December. She has a good plan for Christmas for healthier celebration eating. Her hunger is controlled.  Today's visit was # 19  Starting weight: 292 lbs Starting date: 10/30/2018 Today's weight : 290 lbs Today's date: 09/24/2019 Total lbs lost to date: 2 Total lbs lost since last in-office visit: 1  Vitamin D Deficiency Barbara Mcmahon has a diagnosis of vitamin D deficiency. She is stable on Vit D, and she is due for labs.  Hyperlipidemia Barbara Mcmahon has a diagnosis of hyperlipidemia. She is attempting to improve with diet. She is due to have labs rechecked.  At risk for cardiovascular disease Barbara Mcmahon is at a higher than average risk for cardiovascular disease due to obesity and hyperlipidemia. She currently denies any chest pain.  Pre-Diabetes Barbara Mcmahon has a diagnosis of pre-diabetes. She is doing well with diet and weight loss. She is due to have labs rechecked.  Depression with Emotional Eating Behaviors Barbara Mcmahon is doing well on Topamax and she feels more in control of her emotional eating.  ASSESSMENT AND PLAN:  Vitamin D deficiency - Plan: Vitamin D (25 hydroxy)  Other hyperlipidemia - Plan: Lipid Panel With LDL/HDL Ratio  Prediabetes - Plan: Comprehensive metabolic panel, Hemoglobin A1c, Insulin, random  Other depression, with emotional eating  - Plan: topiramate (TOPAMAX) 50 MG tablet  At risk for heart disease  Class 3 severe obesity with serious comorbidity and body mass index (BMI) of 45.0 to 49.9 in adult, unspecified obesity type (Barbara Mcmahon)  PLAN:  Vitamin D Deficiency Low  vitamin D level contributes to fatigue and are associated with obesity, breast, and colon cancer. [name] agrees to continue taking prescription Vit D 50,000 IU every week #4 and we will refill for 1 month. She will follow up for routine testing of vitamin D, at least 2-3 times per year to avoid over-replacement. We will check labs today and continue to follow up.  Hyperlipidemia Intensive lifestyle modifications as the first line treatment for hyperlipidemia. We discussed many lifestyle modifications today and [name] to work on diet, exercise and weight loss efforts. We will check labs today and continue to follow up.   Cardiovascular risk counseling Barbara Mcmahon was given (~15 minutes) coronary artery disease prevention counseling today. She is 43 y.o. female and has risk factors for heart disease including obesity and hyperlipidemia. We discussed intensive lifestyle modifications today with an emphasis on specific weight loss instructions and strategies.   Pre-Diabetes Barbara Mcmahon will continue to work on weight loss, exercise, and decreasing simple carbohydrates to help decrease the risk of diabetes. We will check labs today and continue to follow up.  Emotional Eating Behaviors (other depression) Behavior modification techniques were discussed today to help Barbara Mcmahon deal with her emotional/non-hunger eating behaviors. Barbara Mcmahon agrees to continue taking Topamax 50 mg PO at bedtime #30 and we will refill for 1 month. We will continue to follow and monitor her progress.  Obesity Barbara Mcmahon is currently in the action stage of change. As such, her goal is to continue with weight loss efforts She has agreed to follow the Category 4 plan  Barbara Mcmahon willo work up to a goal of 150 minutes of combined cardio and strengthening exercise per week for weight loss and overall health benefits. We discussed the following Behavioral Modification Strategies today: holiday eating strategies     Barbara Mcmahon has agreed to follow up with our clinic in 3 to 4 weeks. She was informed of the importance of frequent follow up visits to maximize her success with intensive lifestyle modifications for her multiple health conditions.  ALLERGIES: Allergies  Allergen Reactions  . Morphine And Related Other (See Comments), Hives and Rash    Big mood changes BEHAVIOR CHANGES   . Shellfish Allergy Hives, Swelling and Anaphylaxis    Throat closes  . Codeine Hives and Itching    Pt says she can take percocet  . Clonazepam Itching  . Cinnamon   . Robitussin A-C [Guaifenesin-Codeine] Itching  . Tramadol Itching  . Yeast-Related Products Hives    MEDICATIONS: Current Outpatient Medications on File Prior to Visit  Medication Sig Dispense Refill  . acetaminophen (TYLENOL) 500 MG tablet Take 500 mg by mouth every 6 (six) hours as needed.    Marland Kitchen apixaban (ELIQUIS) 5 MG TABS tablet Take 1 tablet (5 mg total) by mouth 2 (two) times daily. 180 tablet 3  . cetirizine (ZYRTEC) 10 MG tablet Take 10 mg by mouth daily.    Marland Kitchen CRANBERRY PO Take by mouth daily.    . DULoxetine (CYMBALTA) 30 MG capsule TAKE THREE CAPSULES BY MOUTH DAILY 90 capsule 0  . EPINEPHrine (EPI-PEN) 0.3 mg/0.3 mL SOAJ injection Inject 0.3 mLs (0.3 mg total) into the muscle once as needed (anaphylaxis). 1 Device 1  . Melatonin 1 MG CAPS Take 1 capsule by mouth at bedtime.    . Multiple Vitamin (MULTI-VITAMIN) tablet Take by mouth.    . Vitamin D, Ergocalciferol, (DRISDOL) 1.25 MG (50000 UT) CAPS capsule Take 1 capsule (50,000 Units total) by mouth every 7 (seven) days. 4 capsule 0   No current facility-administered medications on file prior to visit.    PAST MEDICAL HISTORY: Past Medical History:  Diagnosis Date  . Abnormal Pap smear and cervical HPV (human papillomavirus)   . Anxiety   . Arthritis    BAck and sacral joint  . Back pain   . Colon polyps   . Constipation   . Gallbladder problem   . Generalized anxiety  disorder   . IBS (irritable bowel syndrome)   . Infertility, female   . Joint pain   . Multiple food allergies    Shellfish, cinnamon and yeast  . Obesity   . PCOS (polycystic ovarian syndrome)   . Prediabetes   . Pulmonary embolism ([place])   . Seizure disorder ([place])   . Vitamin D deficiency     PAST SURGICAL HISTORY: Past Surgical History:  Procedure Laterality Date  . CHOLECYSTECTOMY    . COLPOSCOPY    . DILATION AND CURETTAGE OF UTERUS    . GYNECOLOGIC CRYOSURGERY    . KNEE SURGERY  right    SOCIAL HISTORY: Social History   Tobacco Use  . Smoking status: Never Smoker  . Smokeless tobacco: Never Used  Substance Use Topics  . Alcohol use: No  . Drug use: No    FAMILY HISTORY: Family History  Problem Relation Age of Onset  . Hypertension Father   . Depression Father   . Anxiety disorder Father   . Sleep apnea Father   . Obesity Father   . Diabetes Paternal Grandmother   . Hypertension  Paternal Grandmother   . Stroke Paternal Grandmother   . Diabetes Paternal Grandfather   . Hypertension Paternal Grandfather   . Stroke Paternal Grandfather   . Hypertension Mother   . Depression Mother   . Anxiety disorder Mother   . Eating disorder Mother     ROS: Review of Systems  Constitutional: Positive for weight loss.  Cardiovascular: Negative for chest pain.  Psychiatric/Behavioral: Positive for depression (Emotional eating).    PHYSICAL EXAM: Blood pressure 138/70, pulse 89, temperature 98.1 F (36.7 C), temperature source Oral, height 5\' 5"  (1.651 m), weight 290 lb (131.5 kg), SpO2 99 %. Body mass index is 48.26 kg/m. Physical Exam Vitals reviewed.  Constitutional:      Appearance: Normal appearance. She is obese.  Cardiovascular:     Rate and Rhythm: Normal rate.     Pulses: Normal pulses.  Pulmonary:     Effort: Pulmonary effort is normal.     Breath sounds: Normal breath sounds.  Musculoskeletal:        General: Normal range of motion.  Skin:     General: Skin is warm and dry.  Neurological:     Mental Status: She is alert and oriented to person, place, and time.  Psychiatric:        Mood and Affect: Mood normal.        Behavior: Behavior normal.     RECENT LABS AND TESTS: BMET    Component Value Date/Time   NA 139 09/24/2019 1032   NA 140 10/11/2017 1122   K 4.1 09/24/2019 1032   K 4.2 10/11/2017 1122   CL 106 09/24/2019 1032   CL 103 10/11/2017 1122   CO2 21 09/24/2019 1032   CO2 29 10/11/2017 1122   GLUCOSE 109 (H) 09/24/2019 1032   GLUCOSE 97 06/17/2019 0804   GLUCOSE 95 10/11/2017 1122   BUN 14 09/24/2019 1032   BUN 10 10/11/2017 1122   CREATININE 0.67 09/24/2019 1032   CREATININE 0.74 06/17/2019 0804   CREATININE 0.9 10/11/2017 1122   CALCIUM 8.9 09/24/2019 1032   CALCIUM 8.7 10/11/2017 1122   GFRNONAA 107 09/24/2019 1032   GFRNONAA >60 06/17/2019 0804   GFRNONAA >89 10/16/2012 1646   GFRAA 124 09/24/2019 1032   GFRAA >60 06/17/2019 0804   GFRAA >89 10/16/2012 1646   Lab Results  Component Value Date   HGBA1C 5.9 (H) 09/24/2019   HGBA1C 5.8 (H) 10/30/2018   Lab Results  Component Value Date   INSULIN 17.0 09/24/2019   INSULIN 10.3 10/30/2018   CBC    Component Value Date/Time   WBC 7.7 06/17/2019 0804   WBC 12.2 (H) 12/04/2017 1103   RBC 4.33 06/17/2019 0804   HGB 12.9 06/17/2019 0804   HGB 12.6 10/30/2018 0953   HGB 13.4 10/11/2017 1122   HCT 38.8 06/17/2019 0804   HCT 38.0 10/30/2018 0953   HCT 40.9 10/11/2017 1122   PLT 315 06/17/2019 0804   PLT 291 10/11/2017 1122   MCV 89.6 06/17/2019 0804   MCV 88 10/30/2018 0953   MCV 92 10/11/2017 1122   MCH 29.8 06/17/2019 0804   MCHC 33.2 06/17/2019 0804   RDW 12.9 06/17/2019 0804   RDW 13.2 10/30/2018 0953   RDW 13.3 10/11/2017 1122   LYMPHSABS 2.3 06/17/2019 0804   LYMPHSABS 2.7 10/30/2018 0953   LYMPHSABS 2.8 10/11/2017 1122   MONOABS 0.6 06/17/2019 0804   EOSABS 0.1 06/17/2019 0804   EOSABS 0.1 10/30/2018 0953   EOSABS 0.0  10/11/2017 1122  BASOSABS 0.1 06/17/2019 0804   BASOSABS 0.1 10/30/2018 0953   BASOSABS 0.0 10/11/2017 1122   Iron/TIBC/Ferritin/ %Sat No results found for: IRON, TIBC, FERRITIN, IRONPCTSAT Lipid Panel     Component Value Date/Time   CHOL 186 09/24/2019 1032   TRIG 118 09/24/2019 1032   HDL 46 09/24/2019 1032   LDLCALC 119 (H) 09/24/2019 1032   Hepatic Function Panel     Component Value Date/Time   PROT 6.8 09/24/2019 1032   PROT 6.8 10/11/2017 1122   ALBUMIN 4.2 09/24/2019 1032   AST 19 09/24/2019 1032   AST 15 06/17/2019 0804   ALT 17 09/24/2019 1032   ALT 20 06/17/2019 0804   ALT 36 10/11/2017 1122   ALKPHOS 110 09/24/2019 1032   ALKPHOS 83 10/11/2017 1122   BILITOT 0.3 09/24/2019 1032   BILITOT 0.4 06/17/2019 0804      Component Value Date/Time   TSH 2.280 10/30/2018 0953   TSH 1.351 01/13/2013 1446        I, Trixie Dredge, am acting as transcriptionist for Dennard Nip, MD I have reviewed the above documentation for accuracy and completeness, and I agree with the above. -Dennard Nip, MD

## 2019-10-04 ENCOUNTER — Other Ambulatory Visit: Payer: Self-pay | Admitting: Family Medicine

## 2019-10-04 DIAGNOSIS — M47816 Spondylosis without myelopathy or radiculopathy, lumbar region: Secondary | ICD-10-CM

## 2019-10-06 DIAGNOSIS — M609 Myositis, unspecified: Secondary | ICD-10-CM | POA: Diagnosis not present

## 2019-10-06 DIAGNOSIS — M47817 Spondylosis without myelopathy or radiculopathy, lumbosacral region: Secondary | ICD-10-CM | POA: Diagnosis not present

## 2019-10-06 DIAGNOSIS — M5387 Other specified dorsopathies, lumbosacral region: Secondary | ICD-10-CM | POA: Diagnosis not present

## 2019-10-06 DIAGNOSIS — M5137 Other intervertebral disc degeneration, lumbosacral region: Secondary | ICD-10-CM | POA: Diagnosis not present

## 2019-10-07 DIAGNOSIS — M5137 Other intervertebral disc degeneration, lumbosacral region: Secondary | ICD-10-CM | POA: Diagnosis not present

## 2019-10-07 DIAGNOSIS — M609 Myositis, unspecified: Secondary | ICD-10-CM | POA: Diagnosis not present

## 2019-10-07 DIAGNOSIS — M5387 Other specified dorsopathies, lumbosacral region: Secondary | ICD-10-CM | POA: Diagnosis not present

## 2019-10-07 DIAGNOSIS — M47817 Spondylosis without myelopathy or radiculopathy, lumbosacral region: Secondary | ICD-10-CM | POA: Diagnosis not present

## 2019-10-08 DIAGNOSIS — M609 Myositis, unspecified: Secondary | ICD-10-CM | POA: Diagnosis not present

## 2019-10-08 DIAGNOSIS — M5137 Other intervertebral disc degeneration, lumbosacral region: Secondary | ICD-10-CM | POA: Diagnosis not present

## 2019-10-08 DIAGNOSIS — M47817 Spondylosis without myelopathy or radiculopathy, lumbosacral region: Secondary | ICD-10-CM | POA: Diagnosis not present

## 2019-10-08 DIAGNOSIS — M5387 Other specified dorsopathies, lumbosacral region: Secondary | ICD-10-CM | POA: Diagnosis not present

## 2019-10-08 MED ORDER — VITAMIN D (ERGOCALCIFEROL) 1.25 MG (50000 UNIT) PO CAPS: 50000.0000 [IU] | ORAL_CAPSULE | ORAL | 0 refills | Status: DC

## 2019-10-12 DIAGNOSIS — M5137 Other intervertebral disc degeneration, lumbosacral region: Secondary | ICD-10-CM | POA: Diagnosis not present

## 2019-10-12 DIAGNOSIS — M609 Myositis, unspecified: Secondary | ICD-10-CM | POA: Diagnosis not present

## 2019-10-12 DIAGNOSIS — M5387 Other specified dorsopathies, lumbosacral region: Secondary | ICD-10-CM | POA: Diagnosis not present

## 2019-10-12 DIAGNOSIS — M47817 Spondylosis without myelopathy or radiculopathy, lumbosacral region: Secondary | ICD-10-CM | POA: Diagnosis not present

## 2019-10-14 DIAGNOSIS — M5387 Other specified dorsopathies, lumbosacral region: Secondary | ICD-10-CM | POA: Diagnosis not present

## 2019-10-14 DIAGNOSIS — M609 Myositis, unspecified: Secondary | ICD-10-CM | POA: Diagnosis not present

## 2019-10-14 DIAGNOSIS — M5137 Other intervertebral disc degeneration, lumbosacral region: Secondary | ICD-10-CM | POA: Diagnosis not present

## 2019-10-14 DIAGNOSIS — M47817 Spondylosis without myelopathy or radiculopathy, lumbosacral region: Secondary | ICD-10-CM | POA: Diagnosis not present

## 2019-10-15 DIAGNOSIS — M5387 Other specified dorsopathies, lumbosacral region: Secondary | ICD-10-CM | POA: Diagnosis not present

## 2019-10-15 DIAGNOSIS — M609 Myositis, unspecified: Secondary | ICD-10-CM | POA: Diagnosis not present

## 2019-10-15 DIAGNOSIS — M5137 Other intervertebral disc degeneration, lumbosacral region: Secondary | ICD-10-CM | POA: Diagnosis not present

## 2019-10-15 DIAGNOSIS — M47817 Spondylosis without myelopathy or radiculopathy, lumbosacral region: Secondary | ICD-10-CM | POA: Diagnosis not present

## 2019-10-19 ENCOUNTER — Encounter (INDEPENDENT_AMBULATORY_CARE_PROVIDER_SITE_OTHER): Payer: Self-pay | Admitting: Family Medicine

## 2019-10-19 DIAGNOSIS — M5137 Other intervertebral disc degeneration, lumbosacral region: Secondary | ICD-10-CM | POA: Diagnosis not present

## 2019-10-19 DIAGNOSIS — M5387 Other specified dorsopathies, lumbosacral region: Secondary | ICD-10-CM | POA: Diagnosis not present

## 2019-10-19 DIAGNOSIS — M47817 Spondylosis without myelopathy or radiculopathy, lumbosacral region: Secondary | ICD-10-CM | POA: Diagnosis not present

## 2019-10-19 DIAGNOSIS — M609 Myositis, unspecified: Secondary | ICD-10-CM | POA: Diagnosis not present

## 2019-10-20 NOTE — Telephone Encounter (Signed)
FYI

## 2019-10-22 ENCOUNTER — Ambulatory Visit (INDEPENDENT_AMBULATORY_CARE_PROVIDER_SITE_OTHER): Payer: BC Managed Care – PPO | Admitting: Family Medicine

## 2019-10-22 ENCOUNTER — Other Ambulatory Visit: Payer: Self-pay

## 2019-10-22 ENCOUNTER — Encounter (INDEPENDENT_AMBULATORY_CARE_PROVIDER_SITE_OTHER): Payer: Self-pay | Admitting: Family Medicine

## 2019-10-22 VITALS — BP 102/63 | HR 78 | Temp 97.9°F | Ht 65.0 in | Wt 291.0 lb

## 2019-10-22 DIAGNOSIS — F419 Anxiety disorder, unspecified: Secondary | ICD-10-CM | POA: Diagnosis not present

## 2019-10-22 DIAGNOSIS — Z6841 Body Mass Index (BMI) 40.0 and over, adult: Secondary | ICD-10-CM | POA: Diagnosis not present

## 2019-10-23 DIAGNOSIS — M609 Myositis, unspecified: Secondary | ICD-10-CM | POA: Diagnosis not present

## 2019-10-23 DIAGNOSIS — M47817 Spondylosis without myelopathy or radiculopathy, lumbosacral region: Secondary | ICD-10-CM | POA: Diagnosis not present

## 2019-10-23 DIAGNOSIS — M5387 Other specified dorsopathies, lumbosacral region: Secondary | ICD-10-CM | POA: Diagnosis not present

## 2019-10-23 DIAGNOSIS — M5137 Other intervertebral disc degeneration, lumbosacral region: Secondary | ICD-10-CM | POA: Diagnosis not present

## 2019-10-26 DIAGNOSIS — Z01419 Encounter for gynecological examination (general) (routine) without abnormal findings: Secondary | ICD-10-CM | POA: Diagnosis not present

## 2019-10-26 DIAGNOSIS — Z6841 Body Mass Index (BMI) 40.0 and over, adult: Secondary | ICD-10-CM | POA: Diagnosis not present

## 2019-10-26 DIAGNOSIS — Z1231 Encounter for screening mammogram for malignant neoplasm of breast: Secondary | ICD-10-CM | POA: Diagnosis not present

## 2019-10-27 DIAGNOSIS — M609 Myositis, unspecified: Secondary | ICD-10-CM | POA: Diagnosis not present

## 2019-10-27 DIAGNOSIS — M5137 Other intervertebral disc degeneration, lumbosacral region: Secondary | ICD-10-CM | POA: Diagnosis not present

## 2019-10-27 DIAGNOSIS — M5387 Other specified dorsopathies, lumbosacral region: Secondary | ICD-10-CM | POA: Diagnosis not present

## 2019-10-27 DIAGNOSIS — M47817 Spondylosis without myelopathy or radiculopathy, lumbosacral region: Secondary | ICD-10-CM | POA: Diagnosis not present

## 2019-10-27 NOTE — Progress Notes (Signed)
Chief Complaint:   OBESITY Barbara Mcmahon is here to discuss her progress with her obesity treatment plan along with follow-up of her obesity related diagnoses. Barbara Mcmahon is on the Category 4 Plan and states she is following her eating plan approximately 90% of the time. Barbara Mcmahon states she is doing yoga and physical therapy for 30 minutes 3 times per week.  Today's visit was #: 20 Starting weight: 292 lbs Starting date: 10/30/2018 Today's weight: 291 lbs Today's date: 10/22/2019 Total lbs lost to date: 1 Total lbs lost since last in-office visit: 0  Interim History: Barbara Mcmahon did well minimizing her weight gain over the holidays. She states she is ready to get back on track ans she is willing to look at other eating plans.  Subjective:   1. Anxiety Barbara Mcmahon stopped her Topamax as she felt it wasn't helping decrease emotional eating. She is stable on her Cymbalta and shows no signs of serotonin syndrome.  Assessment/Plan:   1. Anxiety Barbara Mcmahon agrees to discontinue Topamax and she will continue Cymbalta at 60 mg BID (written by Psychiatrist). We will continue to monitor.  2. Class 3 severe obesity with serious comorbidity and body mass index (BMI) of 45.0 to 49.9 in adult, unspecified obesity type (HCC) Barbara Mcmahon is currently in the action stage of change. As such, her goal is to continue with weight loss efforts. She has agreed to change to following a lower carbohydrate, vegetable and lean protein rich diet plan.   Exercise goals:Contine as is.  Behavioral modification strategies: decreasing simple carbohydrates and increasing water intake.  Barbara Mcmahon has agreed to follow-up with our clinic in 2 to 3 weeks. She was informed of the importance of frequent follow-up visits to maximize her success with intensive lifestyle modifications for her multiple health conditions.   Objective:   Blood pressure 102/63, pulse 78, temperature 97.9 F (36.6 C), temperature source Oral,  height 5\' 5"  (1.651 m), weight 291 lb (132 kg), last menstrual period 10/20/2019, SpO2 90 %. Body mass index is 48.42 kg/m.  General: Cooperative, alert, well developed, in no acute distress. HEENT: Conjunctivae and lids unremarkable. Cardiovascular: Regular rhythm.  Lungs: Normal work of breathing. Neurologic: No focal deficits.   Lab Results  Component Value Date   CREATININE 0.67 09/24/2019   BUN 14 09/24/2019   NA 139 09/24/2019   K 4.1 09/24/2019   CL 106 09/24/2019   CO2 21 09/24/2019   Lab Results  Component Value Date   ALT 17 09/24/2019   AST 19 09/24/2019   ALKPHOS 110 09/24/2019   BILITOT 0.3 09/24/2019   Lab Results  Component Value Date   HGBA1C 5.9 (H) 09/24/2019   HGBA1C 5.8 (H) 10/30/2018   Lab Results  Component Value Date   INSULIN 17.0 09/24/2019   INSULIN 10.3 10/30/2018   Lab Results  Component Value Date   TSH 2.280 10/30/2018   Lab Results  Component Value Date   CHOL 186 09/24/2019   HDL 46 09/24/2019   LDLCALC 119 (H) 09/24/2019   TRIG 118 09/24/2019   Lab Results  Component Value Date   WBC 7.7 06/17/2019   HGB 12.9 06/17/2019   HCT 38.8 06/17/2019   MCV 89.6 06/17/2019   PLT 315 06/17/2019   No results found for: IRON, TIBC, FERRITIN  Attestation Statements:   Reviewed by clinician on day of visit: allergies, medications, problem list, medical history, surgical history, family history, social history, and previous encounter notes.  Time spent on visit including pre-visit chart  review and post-visit care was 22 minutes.   I, Trixie Dredge, am acting as transcriptionist for Dennard Nip, MD.  I have reviewed the above documentation for accuracy and completeness, and I agree with the above. -  Dennard Nip, MD

## 2019-10-29 ENCOUNTER — Encounter (INDEPENDENT_AMBULATORY_CARE_PROVIDER_SITE_OTHER): Payer: Self-pay | Admitting: Family Medicine

## 2019-10-29 ENCOUNTER — Other Ambulatory Visit: Payer: Self-pay | Admitting: Obstetrics and Gynecology

## 2019-10-29 DIAGNOSIS — M5387 Other specified dorsopathies, lumbosacral region: Secondary | ICD-10-CM | POA: Diagnosis not present

## 2019-10-29 DIAGNOSIS — M5137 Other intervertebral disc degeneration, lumbosacral region: Secondary | ICD-10-CM | POA: Diagnosis not present

## 2019-10-29 DIAGNOSIS — M609 Myositis, unspecified: Secondary | ICD-10-CM | POA: Diagnosis not present

## 2019-10-29 DIAGNOSIS — M47817 Spondylosis without myelopathy or radiculopathy, lumbosacral region: Secondary | ICD-10-CM | POA: Diagnosis not present

## 2019-10-29 DIAGNOSIS — R928 Other abnormal and inconclusive findings on diagnostic imaging of breast: Secondary | ICD-10-CM

## 2019-10-29 NOTE — Telephone Encounter (Signed)
fyi

## 2019-10-30 ENCOUNTER — Encounter: Payer: Self-pay | Admitting: Physician Assistant

## 2019-10-30 ENCOUNTER — Telehealth (INDEPENDENT_AMBULATORY_CARE_PROVIDER_SITE_OTHER): Payer: BC Managed Care – PPO | Admitting: Physician Assistant

## 2019-10-30 VITALS — BP 128/80 | Temp 98.0°F | Ht 65.0 in | Wt 290.0 lb

## 2019-10-30 DIAGNOSIS — H9202 Otalgia, left ear: Secondary | ICD-10-CM

## 2019-10-30 DIAGNOSIS — J3489 Other specified disorders of nose and nasal sinuses: Secondary | ICD-10-CM | POA: Diagnosis not present

## 2019-10-30 MED ORDER — AMOXICILLIN-POT CLAVULANATE 875-125 MG PO TABS: 1.0000 | ORAL_TABLET | Freq: Two times a day (BID) | ORAL | 0 refills | Status: DC

## 2019-10-30 MED ORDER — FLUTICASONE PROPIONATE 50 MCG/ACT NA SUSP: 2.0000 | Freq: Every day | NASAL | 6 refills | Status: DC

## 2019-10-30 NOTE — Progress Notes (Addendum)
Patient ID: Barbara Mcmahon, female   DOB: [birth year], 44 y.o.   MRN: [id number] .Marland KitchenVirtual Visit via Video Note  I connected with Barbara Mcmahon on 10/30/19 at  8:30 AM EST by a video enabled telemedicine application and verified that I am speaking with the correct person using two identifiers.  Location: Patient: home Provider: clinic   I discussed the limitations of evaluation and management by telemedicine and the availability of in person appointments. The patient expressed understanding and agreed to proceed.  History of Present Illness: Pt is a 44 yo female with hx of chronic maxillary sinusitis and otitis media who presents to the clinic with 1 week of left ear pain and fullness that is progressively getting worse. She denies any cough, ST, fever, chills, body aches, loss of smell or taste. No sick contacts. Sounds make pain worse in left ear. No hearing loss. Taking claritin daily for allergies. She admits to feeling "full in her sinuses". She has to use headset at work and ear pain is making it hard to do. She complains of some dizziness intermittently. Tylenol taking for pain.   .. Active Ambulatory Problems    Diagnosis Date Noted  . Obesity, morbid (more than 100 lbs over ideal weight or BMI > 40) ([place]) 06/30/2012  . Anxiety 06/30/2012  . PCOS (polycystic ovarian syndrome) 06/30/2012  . Irritable bowel syndrome with diarrhea (colonoscopy 2007) 07/04/2012  . Raynaud disease 11/19/2012  . Family history of hypertrophic cardiomyopathy 04/23/2013  . MRSA (methicillin resistant Staphylococcus aureus) 01/14/2015  . Shrimp allergy 09/24/2016  . 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 GAD (generalized anxiety disorder) 04/08/2017  . 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 Pulmonary embolism ([place]) 07/04/2017  . Chronic maxillary sinusitis 08/22/2018  . Insulin resistance 04/01/2019  . BMI 45.0-49.9, adult ([place]) 04/01/2019  . Constipation due to slow transit 04/01/2019  .  Prediabetes 03/17/2015  . Dyslipidemia 03/17/2015  . Vitamin D deficiency 05/21/2019  . Class 3 severe obesity with serious comorbidity and body mass index (BMI) of 45.0 to 49.9 in adult [facility]) 05/21/2019   Resolved Ambulatory Problems    Diagnosis Date Noted  . Acute maxillary sinusitis 06/30/2012  . Preventive measure 11/18/2013  . Cellulitis and abscess of trunk 12/01/2014  . Cellulitis 01/14/2015  . Pain of right upper extremity 08/09/2015   Past Medical History:  Diagnosis Date  . Abnormal Pap smear and cervical HPV (human papillomavirus)   . Arthritis   . Back pain   . Colon polyps   . Constipation   . Gallbladder problem   . Generalized anxiety disorder   . IBS (irritable bowel syndrome)   . Infertility, female   . Joint pain   . Multiple food allergies   . Obesity   . Seizure disorder [facility])    Reviewed med, allergy, problem list.     Observations/Objective: No acute distress.  Normal breathing.  Normal mood and appearance.  No pain with palpation to tragus.   .. Today's Vitals   10/30/19 0807  BP: 128/80  Temp: 98 F (36.7 C)  TempSrc: Tympanic  Weight: 290 lb (131.5 kg)  Height: 5\' 5"  (1.651 m)   Body mass index is 48.26 kg/m.    Assessment and Plan: Marland KitchenMarland KitchenMartena was seen today for ear pain and dizziness.  Diagnoses and all orders for this visit:  Acute ear pain, left -     amoxicillin-clavulanate (AUGMENTIN) 875-125 MG tablet; Take 1 tablet by mouth 2 (two) times daily. -  fluticasone (FLONASE) 50 MCG/ACT nasal spray; Place 2 sprays into both nostrils daily.  Sinus pressure -     amoxicillin-clavulanate (AUGMENTIN) 875-125 MG tablet; Take 1 tablet by mouth 2 (two) times daily. -     fluticasone (FLONASE) 50 MCG/ACT nasal spray; Place 2 sprays into both nostrils daily.   Unable to visulize ear. Do not suspect Covid. Likely otitis media vs ETD. Start flonase for next 2 days if not improving may add augmentin. If having any hearing loss needs  in person visit for inspection of ear and to see if cerumen impaction etc. Stay hydrated. Follow up as needed.   Spent 10 minutes with patient and with chart review.    Follow Up Instructions:    I discussed the assessment and treatment plan with the patient. The patient was provided an opportunity to ask questions and all were answered. The patient agreed with the plan and demonstrated an understanding of the instructions.   The patient was advised to call back or seek an in-person evaluation if the symptoms worsen or if the condition fails to improve as anticipated.   Iran Planas, PA-C

## 2019-10-30 NOTE — Progress Notes (Signed)
Left ear ache - progressively worse over the last couple weeks Worse when laying down No hearing loss Has to use headset at work and sound makes worse Little headache (thinks allergy related) Dizziness started two days ago - happening all the time No other symptoms - congestion/sore throat/cough/etc  Has taken tylenol OTC for pain, no other meds

## 2019-11-01 ENCOUNTER — Encounter: Payer: Self-pay | Admitting: Physician Assistant

## 2019-11-02 ENCOUNTER — Ambulatory Visit (INDEPENDENT_AMBULATORY_CARE_PROVIDER_SITE_OTHER): Payer: BC Managed Care – PPO | Admitting: Family Medicine

## 2019-11-02 DIAGNOSIS — Z209 Contact with and (suspected) exposure to unspecified communicable disease: Secondary | ICD-10-CM | POA: Diagnosis not present

## 2019-11-02 NOTE — Progress Notes (Signed)
Patient presented via drive thru for COVID self swab

## 2019-11-02 NOTE — Telephone Encounter (Signed)
Not sure who is doing nurse visits today. If we could put her on.

## 2019-11-04 ENCOUNTER — Encounter: Payer: Self-pay | Admitting: Family Medicine

## 2019-11-04 ENCOUNTER — Encounter (INDEPENDENT_AMBULATORY_CARE_PROVIDER_SITE_OTHER): Payer: Self-pay | Admitting: Family Medicine

## 2019-11-04 LAB — NOVEL CORONAVIRUS, NAA: SARS-CoV-2, NAA: DETECTED — AB

## 2019-11-04 NOTE — Telephone Encounter (Signed)
Please advise 

## 2019-11-05 ENCOUNTER — Encounter: Payer: Self-pay | Admitting: Family Medicine

## 2019-11-05 ENCOUNTER — Other Ambulatory Visit: Payer: Self-pay | Admitting: Internal Medicine

## 2019-11-05 ENCOUNTER — Telehealth: Payer: Self-pay | Admitting: Internal Medicine

## 2019-11-05 DIAGNOSIS — U071 COVID-19: Secondary | ICD-10-CM

## 2019-11-05 DIAGNOSIS — Z6841 Body Mass Index (BMI) 40.0 and over, adult: Secondary | ICD-10-CM

## 2019-11-05 NOTE — Telephone Encounter (Signed)
  I connected by phone with Barbara Mcmahon on 11/05/2019 at 7:48 PM to discuss the potential use of an new treatment for mild to moderate COVID-19 viral infection in non-hospitalized patients.  This patient is a 44 y.o. female that meets the FDA criteria for Emergency Use Authorization of bamlanivimab or casirivimab\imdevimab.  Has a (+) direct SARS-CoV-2 viral test result  Has mild or moderate COVID-19   Is ? 45 years of age and weighs ? 40 kg  Is NOT hospitalized due to COVID-19  Is NOT requiring oxygen therapy or requiring an increase in baseline oxygen flow rate due to COVID-19  Is within 10 days of symptom onset  Has at least one of the high risk factor(s) for progression to severe COVID-19 and/or hospitalization as defined in EUA.  Specific high risk criteria : BMI >/= 35   I have spoken and communicated the following to the patient or parent/caregiver:  1. FDA has authorized the emergency use of bamlanivimab and casirivimab\imdevimab for the treatment of mild to moderate COVID-19 in adults and pediatric patients with positive results of direct SARS-CoV-2 viral testing who are 61 years of age and older weighing at least 40 kg, and who are at high risk for progressing to severe COVID-19 and/or hospitalization.  2. The significant known and potential risks and benefits of bamlanivimab and casirivimab\imdevimab, and the extent to which such potential risks and benefits are unknown.  3. Information on available alternative treatments and the risks and benefits of those alternatives, including clinical trials.  4. Patients treated with bamlanivimab and casirivimab\imdevimab should continue to self-isolate and use infection control measures (e.g., wear mask, isolate, social distance, avoid sharing personal items, clean and disinfect "high touch" surfaces, and frequent handwashing) according to CDC guidelines.   5. The patient or parent/caregiver has the option to accept or refuse  bamlanivimab or casirivimab\imdevimab .  After reviewing this information with the patient, The patient agreed to proceed with receiving the bamlanimivab infusion and will be provided a copy of the Fact sheet prior to receiving the infusion.Alan Ripper, NP-C [facility] [place]  pgr [phone]

## 2019-11-08 ENCOUNTER — Ambulatory Visit (HOSPITAL_COMMUNITY)
Admission: RE | Admit: 2019-11-08 | Discharge: 2019-11-08 | Disposition: A | Discharge status: Home / Self Care | Payer: BC Managed Care – PPO | Source: Ambulatory Visit | Attending: Pulmonary Disease | Admitting: Pulmonary Disease

## 2019-11-08 DIAGNOSIS — U071 COVID-19: Secondary | ICD-10-CM | POA: Diagnosis not present

## 2019-11-08 DIAGNOSIS — Z6841 Body Mass Index (BMI) 40.0 and over, adult: Secondary | ICD-10-CM

## 2019-11-08 DIAGNOSIS — Z23 Encounter for immunization: Secondary | ICD-10-CM | POA: Diagnosis not present

## 2019-11-08 MED ORDER — METHYLPREDNISOLONE SODIUM SUCC 125 MG IJ SOLR: 125.0000 mg | Freq: Once | INTRAMUSCULAR | Status: DC | PRN

## 2019-11-08 MED ORDER — SODIUM CHLORIDE 0.9 % IV SOLN
INTRAVENOUS | Status: DC | PRN
  Administered 2019-11-08: 250 mL via INTRAVENOUS

## 2019-11-08 MED ORDER — EPINEPHRINE 0.3 MG/0.3ML IJ SOAJ: 0.3000 mg | Freq: Once | INTRAMUSCULAR | Status: DC | PRN

## 2019-11-08 MED ORDER — FAMOTIDINE IN NACL 20-0.9 MG/50ML-% IV SOLN: 20.0000 mg | Freq: Once | INTRAVENOUS | Status: DC | PRN

## 2019-11-08 MED ORDER — ALBUTEROL SULFATE HFA 108 (90 BASE) MCG/ACT IN AERS: 2.0000 | INHALATION_SPRAY | Freq: Once | RESPIRATORY_TRACT | Status: DC | PRN

## 2019-11-08 MED ORDER — SODIUM CHLORIDE 0.9 % IV SOLN
700.0000 mg | Freq: Once | INTRAVENOUS | Status: AC
  Administered 2019-11-08: 700 mg via INTRAVENOUS
  Filled 2019-11-08: qty 20

## 2019-11-08 MED ORDER — DIPHENHYDRAMINE HCL 50 MG/ML IJ SOLN: 50.0000 mg | Freq: Once | INTRAMUSCULAR | Status: DC | PRN

## 2019-11-08 NOTE — Progress Notes (Signed)
Patient ID: [name], female   DOB: 11-Mar-1976, 44 y.o.   MRN: [id number]   Diagnosis: [id number]  Physician:  Procedure: [facility] Infusion [facility] Med: bamlanivimab infusion - Provided patient with bamlanimivab fact sheet for patients, parents and caregivers prior to infusion.  Complications: No immediate complications noted.  Discharge: Discharged home   Harriett Sine 11/08/2019

## 2019-11-08 NOTE — Discharge Instructions (Signed)

## 2019-11-09 ENCOUNTER — Other Ambulatory Visit: Payer: Self-pay

## 2019-11-09 ENCOUNTER — Telehealth (INDEPENDENT_AMBULATORY_CARE_PROVIDER_SITE_OTHER): Payer: BC Managed Care – PPO | Admitting: Family Medicine

## 2019-11-09 ENCOUNTER — Encounter (INDEPENDENT_AMBULATORY_CARE_PROVIDER_SITE_OTHER): Payer: Self-pay | Admitting: Family Medicine

## 2019-11-09 DIAGNOSIS — Z6841 Body Mass Index (BMI) 40.0 and over, adult: Secondary | ICD-10-CM

## 2019-11-09 DIAGNOSIS — E559 Vitamin D deficiency, unspecified: Secondary | ICD-10-CM

## 2019-11-10 MED ORDER — VITAMIN D (ERGOCALCIFEROL) 1.25 MG (50000 UNIT) PO CAPS: 50000.0000 [IU] | ORAL_CAPSULE | ORAL | 0 refills | Status: DC

## 2019-11-10 NOTE — Progress Notes (Signed)
TeleHealth Visit:  Due to the COVID-19 pandemic, this visit was completed with telemedicine (audio/video) technology to reduce patient and provider exposure as well as to preserve personal protective equipment.   Barbara Mcmahon has verbally consented to this TeleHealth visit. The patient is located at home, the provider is located at the [facility] office. The participants in this visit include the listed provider and patient. The visit was conducted today via [email or web address].   Chief Complaint: OBESITY Barbara Mcmahon is here to discuss her progress with her obesity treatment plan along with follow-up of her obesity related diagnoses. Barbara Mcmahon is on following a lower carbohydrate, vegetable and lean protein rich diet plan and states she is following her eating plan approximately 50% of the time. Barbara Mcmahon states she is doing kids version yoga for 30 minutes 4-5 times per week.  Today's visit was #: 21 Starting weight: 292 lbs Starting date: 10/30/2018  Interim History: Barbara Mcmahon was recently diagnosed with COVID19 and she had monoclonal antibodies. She is still feeling poorly (tired and achy) and decreased appetite. She was doing well on her low carbohydrate plan before she got sick, and she feels she would like to go back to that plan when she feels better.  Subjective:   1. Vitamin D deficiency Barbara Mcmahon is stable on Vit D, and she requests a refill today.   Assessment/Plan:   1. Vitamin D deficiency Low Vitamin D level contributes to fatigue and are associated with obesity, breast, and colon cancer. We will refill prescription Vitamin D for 1 month. Barbara Mcmahon will follow-up for routine testing of Vitamin D, at least 2-3 times per year to avoid over-replacement.  - Vitamin D, Ergocalciferol, (DRISDOL) 1.25 MG (50000 UNIT) CAPS capsule; Take 1 capsule (50,000 Units total) by mouth every 7 (seven) days.  Dispense: 4 capsule; Refill: 0  2. Class 3 severe obesity with serious  comorbidity and body mass index (BMI) of 45.0 to 49.9 in adult, unspecified obesity type (HCC) Barbara Mcmahon is currently in the action stage of change. As such, her goal is to continue with weight loss efforts. She has agreed to practicing portion control and making smarter food choices, such as increasing vegetables and decreasing simple carbohydrates, until she recovers.   Exercise goals: As is  Behavioral modification strategies: increasing lean protein intake and no skipping meals.  Barbara Mcmahon has agreed to follow-up with our clinic in 3 weeks with myself. She was informed of the importance of frequent follow-up visits to maximize her success with intensive lifestyle modifications for her multiple health conditions.  Objective:   VITALS: Per patient if applicable, see vitals. GENERAL: Alert and in no acute distress. CARDIOPULMONARY: No increased WOB. Speaking in clear sentences.  PSYCH: Pleasant and cooperative. Speech normal rate and rhythm. Affect is appropriate. Insight and judgement are appropriate. Attention is focused, linear, and appropriate.  NEURO: Oriented as arrived to appointment on time with no prompting.   Lab Results  Component Value Date   CREATININE 0.67 09/24/2019   BUN 14 09/24/2019   NA 139 09/24/2019   K 4.1 09/24/2019   CL 106 09/24/2019   CO2 21 09/24/2019   Lab Results  Component Value Date   ALT 17 09/24/2019   AST 19 09/24/2019   ALKPHOS 110 09/24/2019   BILITOT 0.3 09/24/2019   Lab Results  Component Value Date   HGBA1C 5.9 (H) 09/24/2019   HGBA1C 5.8 (H) 10/30/2018   Lab Results  Component Value Date   INSULIN 17.0 09/24/2019   INSULIN  10.3 10/30/2018   Lab Results  Component Value Date   TSH 2.280 10/30/2018   Lab Results  Component Value Date   CHOL 186 09/24/2019   HDL 46 09/24/2019   LDLCALC 119 (H) 09/24/2019   TRIG 118 09/24/2019   Lab Results  Component Value Date   WBC 7.7 06/17/2019   HGB 12.9 06/17/2019   HCT 38.8  06/17/2019   MCV 89.6 06/17/2019   PLT 315 06/17/2019   No results found for: IRON, TIBC, FERRITIN  Attestation Statements:   Reviewed by clinician on day of visit: allergies, medications, problem list, medical history, surgical history, family history, social history, and previous encounter notes.   I, Trixie Dredge, am acting as transcriptionist for Dennard Nip, MD.  I have reviewed the above documentation for accuracy and completeness, and I agree with the above. - Dennard Nip, MD

## 2019-11-11 ENCOUNTER — Encounter: Payer: Self-pay | Admitting: Hematology & Oncology

## 2019-11-24 ENCOUNTER — Encounter: Payer: Self-pay | Admitting: Family Medicine

## 2019-11-27 ENCOUNTER — Ambulatory Visit: Payer: BC Managed Care – PPO | Admitting: Physician Assistant

## 2019-11-27 ENCOUNTER — Encounter: Payer: Self-pay | Admitting: Physician Assistant

## 2019-11-27 VITALS — BP 135/68 | HR 83 | Ht 65.0 in | Wt 287.0 lb

## 2019-11-27 DIAGNOSIS — H9202 Otalgia, left ear: Secondary | ICD-10-CM | POA: Diagnosis not present

## 2019-11-27 MED ORDER — DOXYCYCLINE HYCLATE 100 MG PO TABS: 100.0000 mg | ORAL_TABLET | Freq: Two times a day (BID) | ORAL | 0 refills | Status: DC

## 2019-11-27 MED ORDER — HYDROCORTISONE-ACETIC ACID 1-2 % OT SOLN: 3.0000 [drp] | Freq: Three times a day (TID) | OTIC | 0 refills | Status: DC

## 2019-11-27 NOTE — Progress Notes (Signed)
Subjective:    Patient ID: Barbara Mcmahon, female    DOB: 07/11/1976, 44 y.o.   MRN: [id number]  HPI  Pt is a 44 yo female who presents to the clinic with 1 month of ear pain. She was seen virtually and treated with augmentin and flonase and afrin on 1/22. She then developed covid. She has recovered from covid but her ear still hurts. No hearing loss, dizziness, ringing in ears. Hurts to touch her left ear, lay on that side, or wear head phones. No fever, chills. No ear discharge. No jaw pain or problems eating and or chewing.   .. Active Ambulatory Problems    Diagnosis Date Noted  . Obesity, morbid (more than 100 lbs over ideal weight or BMI > 40) ([place]) 06/30/2012  . Anxiety 06/30/2012  . PCOS (polycystic ovarian syndrome) 06/30/2012  . Irritable bowel syndrome with diarrhea (colonoscopy 2007) 07/04/2012  . Raynaud disease 11/19/2012  . Family history of hypertrophic cardiomyopathy 04/23/2013  . MRSA (methicillin resistant Staphylococcus aureus) 01/14/2015  . Shrimp allergy 09/24/2016  . 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 GAD (generalized anxiety disorder) 04/08/2017  . 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 Pulmonary embolism ([place]) 07/04/2017  . Chronic maxillary sinusitis 08/22/2018  . Insulin resistance 04/01/2019  . BMI 45.0-49.9, adult ([place]) 04/01/2019  . Constipation due to slow transit 04/01/2019  . Prediabetes 03/17/2015  . Dyslipidemia 03/17/2015  . Vitamin D deficiency 05/21/2019  . Class 3 severe obesity with serious comorbidity and body mass index (BMI) of 45.0 to 49.9 in adult [facility]) 05/21/2019   Resolved Ambulatory Problems    Diagnosis Date Noted  . Acute maxillary sinusitis 06/30/2012  . Preventive measure 11/18/2013  . Cellulitis and abscess of trunk 12/01/2014  . Cellulitis 01/14/2015  . Pain of right upper extremity 08/09/2015   Past Medical History:  Diagnosis Date  . Abnormal Pap smear and cervical HPV (human papillomavirus)   . Arthritis   .  Back pain   . Colon polyps   . Constipation   . Gallbladder problem   . Generalized anxiety disorder   . IBS (irritable bowel syndrome)   . Infertility, female   . Joint pain   . Multiple food allergies   . Obesity   . Seizure disorder [facility])     Review of Systems See HPI.     Objective:   Physical Exam Vitals reviewed.  Constitutional:      Appearance: Normal appearance. She is obese.  HENT:     Head: Normocephalic.     Right Ear: Tympanic membrane, ear canal and external ear normal. There is no impacted cerumen.     Left Ear: Tympanic membrane normal. There is no impacted cerumen.     Ears:     Comments: TM external canal slightly red on tragus side with some minimal swelling. No purulent discharge. Normal TM.  Pain with palpation over tragus.  Scant cerumen.  No pain over TMJ to palpation.     Nose: Nose normal.     Mouth/Throat:     Mouth: Mucous membranes are moist.  Cardiovascular:     Rate and Rhythm: Normal rate.  Pulmonary:     Effort: Pulmonary effort is normal.  Neurological:     Mental Status: She is alert.           Assessment & Plan:  Marland KitchenMarland KitchenAlleta was seen today for ear pain.  Diagnoses and all orders for this visit:  Left ear pain -  doxycycline (VIBRA-TABS) 100 MG tablet; Take 1 tablet (100 mg total) by mouth 2 (two) times daily. -     acetic acid-hydrocortisone (VOSOL-HC) OTIC solution; Place 3 drops into the left ear 3 (three) times daily. For 7 days for external ear inflammation.   Reassured patient that ear canal is open and no discharge. It is a little red and slightly swollen. No sign of ETD. Sent acetic acid hydrocortisone drops. Concerned about tenderness to tragus questionable abscess formation. Will treat with doxy, warm compresses. Follow up with ENT if no better.

## 2019-11-30 ENCOUNTER — Ambulatory Visit (INDEPENDENT_AMBULATORY_CARE_PROVIDER_SITE_OTHER): Payer: BC Managed Care – PPO | Admitting: Family Medicine

## 2019-12-07 DIAGNOSIS — M47817 Spondylosis without myelopathy or radiculopathy, lumbosacral region: Secondary | ICD-10-CM | POA: Diagnosis not present

## 2019-12-07 DIAGNOSIS — M609 Myositis, unspecified: Secondary | ICD-10-CM | POA: Diagnosis not present

## 2019-12-07 DIAGNOSIS — M5137 Other intervertebral disc degeneration, lumbosacral region: Secondary | ICD-10-CM | POA: Diagnosis not present

## 2019-12-07 DIAGNOSIS — M5387 Other specified dorsopathies, lumbosacral region: Secondary | ICD-10-CM | POA: Diagnosis not present

## 2019-12-09 ENCOUNTER — Telehealth (INDEPENDENT_AMBULATORY_CARE_PROVIDER_SITE_OTHER): Payer: BC Managed Care – PPO | Admitting: Family Medicine

## 2019-12-09 ENCOUNTER — Other Ambulatory Visit: Payer: Self-pay

## 2019-12-09 DIAGNOSIS — E559 Vitamin D deficiency, unspecified: Secondary | ICD-10-CM | POA: Diagnosis not present

## 2019-12-09 DIAGNOSIS — Z6841 Body Mass Index (BMI) 40.0 and over, adult: Secondary | ICD-10-CM | POA: Diagnosis not present

## 2019-12-09 MED ORDER — VITAMIN D (ERGOCALCIFEROL) 1.25 MG (50000 UNIT) PO CAPS: 50000.0000 [IU] | ORAL_CAPSULE | ORAL | 0 refills | Status: DC

## 2019-12-09 NOTE — Progress Notes (Signed)
TeleHealth Visit:  Due to the COVID-19 pandemic, this visit was completed with telemedicine (audio/video) technology to reduce patient and provider exposure as well as to preserve personal protective equipment.   Barbara Mcmahon has verbally consented to this TeleHealth visit. The patient is located at home, the provider is located at the [facility] office. The participants in this visit include the listed provider and patient. The visit was conducted today via [email or web address].   Chief Complaint: OBESITY Barbara Mcmahon is here to discuss her progress with her obesity treatment plan along with follow-up of her obesity related diagnoses. Barbara Mcmahon is on the Category 4 Plan and states she is following her eating plan approximately 75% of the time. Barbara Mcmahon states she is doing 0 minutes 0 times per week.  Today's visit was #: 22 Starting weight: 292 lbs Starting date: 10/30/2018  Interim History: Barbara Mcmahon continues to do well with weight loss since her last visit. She is now at 284 lbs on her home scale. She is unable to exercise much due to residual shortness of breath since her COVID infection in January.  Subjective:   1. Vitamin D deficiency Barbara Mcmahon is stable on Vit D, but her level is not yet at goal. She requests a refill today.  Assessment/Plan:   1. Vitamin D deficiency Low Vitamin D level contributes to fatigue and are associated with obesity, breast, and colon cancer. We will refill prescription Vitamin D for 1 month. Barbara Mcmahon will follow-up for routine testing of Vitamin D, at least 2-3 times per year to avoid over-replacement.  - Vitamin D, Ergocalciferol, (DRISDOL) 1.25 MG (50000 UNIT) CAPS capsule; Take 1 capsule (50,000 Units total) by mouth every 7 (seven) days.  Dispense: 4 capsule; Refill: 0  2. Class 3 severe obesity with serious comorbidity and body mass index (BMI) of 45.0 to 49.9 in adult, unspecified obesity type (HCC) Barbara Mcmahon is currently in the action stage  of change. As such, her goal is to continue with weight loss efforts. She has agreed to the Category 4 Plan.   Barbara Mcmahon is to work on breathing exercises to help with lung expansion and see if this helps some of her shortness of breath.  Behavioral modification strategies: better snacking choices.  Barbara Mcmahon has agreed to follow-up with our clinic in 3 to 4 weeks. She was informed of the importance of frequent follow-up visits to maximize her success with intensive lifestyle modifications for her multiple health conditions.  Objective:   VITALS: Per patient if applicable, see vitals. GENERAL: Alert and in no acute distress. CARDIOPULMONARY: No increased WOB. Speaking in clear sentences.  PSYCH: Pleasant and cooperative. Speech normal rate and rhythm. Affect is appropriate. Insight and judgement are appropriate. Attention is focused, linear, and appropriate.  NEURO: Oriented as arrived to appointment on time with no prompting.   Lab Results  Component Value Date   CREATININE 0.67 09/24/2019   BUN 14 09/24/2019   NA 139 09/24/2019   K 4.1 09/24/2019   CL 106 09/24/2019   CO2 21 09/24/2019   Lab Results  Component Value Date   ALT 17 09/24/2019   AST 19 09/24/2019   ALKPHOS 110 09/24/2019   BILITOT 0.3 09/24/2019   Lab Results  Component Value Date   HGBA1C 5.9 (H) 09/24/2019   HGBA1C 5.8 (H) 10/30/2018   Lab Results  Component Value Date   INSULIN 17.0 09/24/2019   INSULIN 10.3 10/30/2018   Lab Results  Component Value Date   TSH 2.280 10/30/2018  Lab Results  Component Value Date   CHOL 186 09/24/2019   HDL 46 09/24/2019   LDLCALC 119 (H) 09/24/2019   TRIG 118 09/24/2019   Lab Results  Component Value Date   WBC 7.7 06/17/2019   HGB 12.9 06/17/2019   HCT 38.8 06/17/2019   MCV 89.6 06/17/2019   PLT 315 06/17/2019   No results found for: IRON, TIBC, FERRITIN  Attestation Statements:   Reviewed by clinician on day of visit: allergies, medications,  problem list, medical history, surgical history, family history, social history, and previous encounter notes.  Time spent on visit including pre-visit chart review and post-visit charting and care was 22 minutes.    I, Trixie Dredge, am acting as transcriptionist for Dennard Nip, MD.  I have reviewed the above documentation for accuracy and completeness, and I agree with the above. - Dennard Nip, MD

## 2019-12-10 DIAGNOSIS — M609 Myositis, unspecified: Secondary | ICD-10-CM | POA: Diagnosis not present

## 2019-12-10 DIAGNOSIS — M47817 Spondylosis without myelopathy or radiculopathy, lumbosacral region: Secondary | ICD-10-CM | POA: Diagnosis not present

## 2019-12-10 DIAGNOSIS — M5387 Other specified dorsopathies, lumbosacral region: Secondary | ICD-10-CM | POA: Diagnosis not present

## 2019-12-10 DIAGNOSIS — M5137 Other intervertebral disc degeneration, lumbosacral region: Secondary | ICD-10-CM | POA: Diagnosis not present

## 2019-12-15 ENCOUNTER — Inpatient Hospital Stay: Payer: BC Managed Care – PPO | Attending: Hematology & Oncology | Admitting: Hematology & Oncology

## 2019-12-15 ENCOUNTER — Encounter: Payer: Self-pay | Admitting: Hematology & Oncology

## 2019-12-15 ENCOUNTER — Other Ambulatory Visit: Payer: Self-pay

## 2019-12-15 ENCOUNTER — Inpatient Hospital Stay: Payer: BC Managed Care – PPO

## 2019-12-15 ENCOUNTER — Encounter: Payer: Self-pay | Admitting: *Deleted

## 2019-12-15 VITALS — BP 142/76 | HR 87 | Temp 97.7°F | Resp 18 | Wt 291.0 lb

## 2019-12-15 DIAGNOSIS — Z7901 Long term (current) use of anticoagulants: Secondary | ICD-10-CM | POA: Insufficient documentation

## 2019-12-15 DIAGNOSIS — E282 Polycystic ovarian syndrome: Secondary | ICD-10-CM

## 2019-12-15 DIAGNOSIS — I2699 Other pulmonary embolism without acute cor pulmonale: Secondary | ICD-10-CM | POA: Insufficient documentation

## 2019-12-15 LAB — CMP (CANCER CENTER ONLY)
ALT: 17 U/L (ref 0–44)
AST: 17 U/L (ref 15–41)
Albumin: 3.9 g/dL (ref 3.5–5.0)
Alkaline Phosphatase: 76 U/L (ref 38–126)
Anion gap: 5 (ref 5–15)
BUN: 11 mg/dL (ref 6–20)
CO2: 27 mmol/L (ref 22–32)
Calcium: 8.8 mg/dL — ABNORMAL LOW (ref 8.9–10.3)
Chloride: 106 mmol/L (ref 98–111)
Creatinine: 0.75 mg/dL (ref 0.44–1.00)
GFR, Est AFR Am: >60 mL/min (ref >60)
GFR, Estimated: >60 mL/min (ref >60)
Glucose, Bld: 111 mg/dL — ABNORMAL HIGH (ref 70–99)
Potassium: 4.3 mmol/L (ref 3.5–5.1)
Sodium: 138 mmol/L (ref 135–145)
Total Bilirubin: 0.4 mg/dL (ref 0.3–1.2)
Total Protein: 6.6 g/dL (ref 6.5–8.1)

## 2019-12-15 LAB — CBC WITH DIFFERENTIAL (CANCER CENTER ONLY)
Abs Immature Granulocytes: 0.02 10*3/uL (ref 0.00–0.07)
Basophils Absolute: 0.1 10*3/uL (ref 0.0–0.1)
Basophils Relative: 1 %
Eosinophils Absolute: 0.1 10*3/uL (ref 0.0–0.5)
Eosinophils Relative: 1 %
HCT: 39.3 % (ref 36.0–46.0)
Hemoglobin: 13 g/dL (ref 12.0–15.0)
Immature Granulocytes: 0 %
Lymphocytes Relative: 39 %
Lymphs Abs: 2.5 10*3/uL (ref 0.7–4.0)
MCH: 30 pg (ref 26.0–34.0)
MCHC: 33.1 g/dL (ref 30.0–36.0)
MCV: 90.6 fL (ref 80.0–100.0)
Monocytes Absolute: 0.5 10*3/uL (ref 0.1–1.0)
Monocytes Relative: 8 %
Neutro Abs: 3.2 10*3/uL (ref 1.7–7.7)
Neutrophils Relative %: 51 %
Platelet Count: 335 10*3/uL (ref 150–400)
RBC: 4.34 MIL/uL (ref 3.87–5.11)
RDW: 13.2 % (ref 11.5–15.5)
WBC Count: 6.3 10*3/uL (ref 4.0–10.5)
nRBC: 0 % (ref 0.0–0.2)

## 2019-12-15 NOTE — Progress Notes (Signed)
Hematology and Oncology Follow Up Visit  Barbara Mcmahon [id number] [birth year] 44 y.o. 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 twice daily-complete 1 year of therapy in September 2019  Eilquis 2.5 mg po BID - start 06/08/2018     Interim History:  Barbara Mcmahon is back for follow-up.  We saw her 6 months ago.  She is doing quite well.  She had no problems over the holidays.  She has been very busy at work.  Of interest, she has a podcast.  She and some other women, who are all mom's, no podcast on crimes that may be unsolved.  I find this incredibly fascinating.  She has had no problems with fever.  She did have the coronavirus back in January I think.  Her husband also had the coronavirus.  Her husband had blood clots because of this.  She has had no problems with bleeding.  She has had no issues with the Eliquis.  She is on low-dose Eliquis.  There is been no headache.  She has had no rashes.  She has had no leg swelling.  Overall, her performance status is ECOG 0.    Medications:  Current Outpatient Medications:  .  acetaminophen (TYLENOL) 500 MG tablet, Take 500 mg by mouth every 6 (six) hours as needed., Disp: , Rfl:  .  acetic acid-hydrocortisone (VOSOL-HC) OTIC solution, Place 3 drops into the left ear 3 (three) times daily. For 7 days for external ear inflammation., Disp: 10 mL, Rfl: 0 .  apixaban (ELIQUIS) 5 MG TABS tablet, Take 1 tablet (5 mg total) by mouth 2 (two) times daily., Disp: 180 tablet, Rfl: 3 .  cetirizine (ZYRTEC) 10 MG tablet, Take 10 mg by mouth daily., Disp: , Rfl:  .  CRANBERRY PO, Take by mouth daily., Disp: , Rfl:  .  cyclobenzaprine (FLEXERIL) 10 MG tablet, Take 10 mg by mouth 3 (three) times daily., Disp: , Rfl:  .  doxycycline (VIBRA-TABS) 100 MG tablet, Take 1 tablet (100 mg total) by mouth 2 (two) times daily., Disp: 20 tablet, Rfl: 0 .   DULoxetine (CYMBALTA) 60 MG capsule, Take by mouth. Take 2 capsules by mouth daily, Disp: , Rfl:  .  EPINEPHrine (EPI-PEN) 0.3 mg/0.3 mL SOAJ injection, Inject 0.3 mLs (0.3 mg total) into the muscle once as needed (anaphylaxis). (Patient not taking: Reported on 11/27/2019), Disp: 1 Device, Rfl: 1 .  fluticasone (FLONASE) 50 MCG/ACT nasal spray, Place 2 sprays into both nostrils daily., Disp: 16 g, Rfl: 6 .  hydrOXYzine (ATARAX/VISTARIL) 10 MG tablet, Take 50 mg by mouth at bedtime., Disp: , Rfl:  .  Melatonin 1 MG CAPS, Take 1 capsule by mouth at bedtime., Disp: , Rfl:  .  Multiple Vitamin (MULTI-VITAMIN) tablet, Take by mouth., Disp: , Rfl:  .  Vitamin D, Ergocalciferol, (DRISDOL) 1.25 MG (50000 UNIT) CAPS capsule, Take 1 capsule (50,000 Units total) by mouth every 7 (seven) days., Disp: 4 capsule, Rfl: 0  Allergies:  Allergies  Allergen Reactions  . Morphine And Related Other (See Comments), Hives and Rash    Big mood changes BEHAVIOR CHANGES   . Shellfish Allergy Hives, Swelling and Anaphylaxis    Throat closes  . Codeine Hives and Itching    Pt says she can take percocet  . Clonazepam Itching  . Nutmeg Oil (Myristica Oil)   . Cinnamon   . Robitussin  A-C [Guaifenesin-Codeine] Itching  . Tramadol Itching  . Yeast-Related Products Hives    Past Medical History, Surgical history, Social history, and Family History were reviewed and updated.  Review of Systems: Review of Systems  Constitutional: Negative.   HENT:  Negative.   Eyes: Negative.   Respiratory: Negative.   Cardiovascular: Negative.   Gastrointestinal: Negative.   Endocrine: Negative.   Genitourinary: Negative.    Musculoskeletal: Negative.   Skin: Negative.   Neurological: Negative.   Hematological: Negative.   Psychiatric/Behavioral: Negative.     Physical Exam:  vitals were not taken for this visit.   Wt Readings from Last 3 Encounters:  11/27/19 287 lb (130.2 kg)  10/30/19 290 lb (131.5 kg)  10/22/19  291 lb (132 kg)    Physical Exam Vitals reviewed.  HENT:     Head: Normocephalic and atraumatic.  Eyes:     Pupils: Pupils are equal, round, and reactive to light.  Cardiovascular:     Rate and Rhythm: Normal rate and regular rhythm.     Heart sounds: Normal heart sounds.  Pulmonary:     Effort: Pulmonary effort is normal.     Breath sounds: Normal breath sounds.  Abdominal:     General: Bowel sounds are normal.     Palpations: Abdomen is soft.  Musculoskeletal:        General: No tenderness or deformity. Normal range of motion.     Cervical back: Normal range of motion.  Lymphadenopathy:     Cervical: No cervical adenopathy.  Skin:    General: Skin is warm and dry.     Findings: No erythema or rash.  Neurological:     Mental Status: She is alert and oriented to person, place, and time.  Psychiatric:        Behavior: Behavior normal.        Thought Content: Thought content normal.        Judgment: Judgment normal.      Lab Results  Component Value Date   WBC 6.3 12/15/2019   HGB 13.0 12/15/2019   HCT 39.3 12/15/2019   MCV 90.6 12/15/2019   PLT 335 12/15/2019     Chemistry      Component Value Date/Time   NA 139 09/24/2019 1032   NA 140 10/11/2017 1122   K 4.1 09/24/2019 1032   K 4.2 10/11/2017 1122   CL 106 09/24/2019 1032   CL 103 10/11/2017 1122   CO2 21 09/24/2019 1032   CO2 29 10/11/2017 1122   BUN 14 09/24/2019 1032   BUN 10 10/11/2017 1122   CREATININE 0.67 09/24/2019 1032   CREATININE 0.74 06/17/2019 0804   CREATININE 0.9 10/11/2017 1122      Component Value Date/Time   CALCIUM 8.9 09/24/2019 1032   CALCIUM 8.7 10/11/2017 1122   ALKPHOS 110 09/24/2019 1032   ALKPHOS 83 10/11/2017 1122   AST 19 09/24/2019 1032   AST 15 06/17/2019 0804   ALT 17 09/24/2019 1032   ALT 20 06/17/2019 0804   ALT 36 10/11/2017 1122   BILITOT 0.3 09/24/2019 1032   BILITOT 0.4 06/17/2019 0804       Impression and Plan: Barbara Mcmahon is a 44 year old white  female.  She has polycystic ovaries.  She had a pulmonary embolism in September 2018.  For right now, we will maintain her on the Eliquis.  We will continue her on the Eliquis.  She feels much more confident being on Eliquis long-term.  We will have her  come back in 6 more months.  I think she is post have her coronavirus vaccine sometime in April.  Volanda Napoleon, MD 3/9/20218:11 AM

## 2019-12-17 ENCOUNTER — Other Ambulatory Visit: Payer: BC Managed Care – PPO

## 2019-12-17 DIAGNOSIS — M5387 Other specified dorsopathies, lumbosacral region: Secondary | ICD-10-CM | POA: Diagnosis not present

## 2019-12-17 DIAGNOSIS — L308 Other specified dermatitis: Secondary | ICD-10-CM | POA: Diagnosis not present

## 2019-12-17 DIAGNOSIS — M47817 Spondylosis without myelopathy or radiculopathy, lumbosacral region: Secondary | ICD-10-CM | POA: Diagnosis not present

## 2019-12-17 DIAGNOSIS — M609 Myositis, unspecified: Secondary | ICD-10-CM | POA: Diagnosis not present

## 2019-12-17 DIAGNOSIS — M5137 Other intervertebral disc degeneration, lumbosacral region: Secondary | ICD-10-CM | POA: Diagnosis not present

## 2019-12-19 ENCOUNTER — Encounter: Payer: Self-pay | Admitting: Family Medicine

## 2019-12-24 DIAGNOSIS — M5137 Other intervertebral disc degeneration, lumbosacral region: Secondary | ICD-10-CM | POA: Diagnosis not present

## 2019-12-24 DIAGNOSIS — M47817 Spondylosis without myelopathy or radiculopathy, lumbosacral region: Secondary | ICD-10-CM | POA: Diagnosis not present

## 2019-12-24 DIAGNOSIS — M5387 Other specified dorsopathies, lumbosacral region: Secondary | ICD-10-CM | POA: Diagnosis not present

## 2019-12-24 DIAGNOSIS — M609 Myositis, unspecified: Secondary | ICD-10-CM | POA: Diagnosis not present

## 2019-12-31 ENCOUNTER — Other Ambulatory Visit: Payer: Self-pay

## 2019-12-31 ENCOUNTER — Other Ambulatory Visit: Payer: Self-pay | Admitting: Obstetrics and Gynecology

## 2019-12-31 ENCOUNTER — Ambulatory Visit
Admission: RE | Admit: 2019-12-31 | Discharge: 2019-12-31 | Disposition: A | Discharge status: Home / Self Care | Payer: BC Managed Care – PPO | Source: Ambulatory Visit | Attending: Obstetrics and Gynecology | Admitting: Obstetrics and Gynecology

## 2019-12-31 DIAGNOSIS — R928 Other abnormal and inconclusive findings on diagnostic imaging of breast: Secondary | ICD-10-CM | POA: Diagnosis not present

## 2019-12-31 DIAGNOSIS — N6314 Unspecified lump in the right breast, lower inner quadrant: Secondary | ICD-10-CM | POA: Diagnosis not present

## 2019-12-31 DIAGNOSIS — N6311 Unspecified lump in the right breast, upper outer quadrant: Secondary | ICD-10-CM | POA: Diagnosis not present

## 2019-12-31 DIAGNOSIS — N6313 Unspecified lump in the right breast, lower outer quadrant: Secondary | ICD-10-CM | POA: Diagnosis not present

## 2020-01-01 DIAGNOSIS — L578 Other skin changes due to chronic exposure to nonionizing radiation: Secondary | ICD-10-CM | POA: Diagnosis not present

## 2020-01-01 DIAGNOSIS — D2239 Melanocytic nevi of other parts of face: Secondary | ICD-10-CM | POA: Diagnosis not present

## 2020-01-01 DIAGNOSIS — L298 Other pruritus: Secondary | ICD-10-CM | POA: Diagnosis not present

## 2020-01-02 ENCOUNTER — Encounter: Payer: Self-pay | Admitting: Family Medicine

## 2020-01-04 ENCOUNTER — Other Ambulatory Visit: Payer: Self-pay

## 2020-01-04 ENCOUNTER — Ambulatory Visit (INDEPENDENT_AMBULATORY_CARE_PROVIDER_SITE_OTHER): Payer: BC Managed Care – PPO | Admitting: Family Medicine

## 2020-01-04 ENCOUNTER — Encounter (INDEPENDENT_AMBULATORY_CARE_PROVIDER_SITE_OTHER): Payer: Self-pay | Admitting: Family Medicine

## 2020-01-04 VITALS — BP 138/77 | HR 87 | Temp 97.9°F | Ht 65.0 in | Wt 289.0 lb

## 2020-01-04 DIAGNOSIS — E559 Vitamin D deficiency, unspecified: Secondary | ICD-10-CM | POA: Diagnosis not present

## 2020-01-04 DIAGNOSIS — Z6841 Body Mass Index (BMI) 40.0 and over, adult: Secondary | ICD-10-CM

## 2020-01-04 DIAGNOSIS — R7303 Prediabetes: Secondary | ICD-10-CM | POA: Diagnosis not present

## 2020-01-04 MED ORDER — VITAMIN D (ERGOCALCIFEROL) 1.25 MG (50000 UNIT) PO CAPS: 50000.0000 [IU] | ORAL_CAPSULE | ORAL | 0 refills | Status: DC

## 2020-01-05 NOTE — Progress Notes (Signed)
Chief Complaint:   OBESITY Barbara Mcmahon is here to discuss her progress with her obesity treatment plan along with follow-up of her obesity related diagnoses. Barbara Mcmahon is on the Category 4 Plan and states she is following her eating plan approximately 95% of the time. Barbara Mcmahon states she is doing 0 minutes 0 times per week.  Today's visit was #: 23 Starting weight: 292 lbs Starting date: 10/30/2018 Today's weight: 289 lbs Today's date: 01/04/2020 Total lbs lost to date: 3 Total lbs lost since last in-office visit: 2  Interim History: Barbara Mcmahon is frustrated with slow weight loss. She has considered bariatric surgery. Her insurance will not cover the procedure, however she can appeal since she has been trying lifestyle modifications >1 year.  Subjective:   1. Vitamin D deficiency Barbara Mcmahon's last Vit D level was 22.6 on 09/24/2019. She is currently on prescription Vit D supplementation.  2. Pre-diabetes Barbara Mcmahon's last A1c was 5.9 on 09/24/2019. She has been focusing on lifestyle modifications to lower her blood glucose. She is not on metformin.  Assessment/Plan:   1. Vitamin D deficiency Low Vitamin D level contributes to fatigue and are associated with obesity, breast, and colon cancer. We will refill prescription Vitamin D for 1 month. Barbara Mcmahon will follow-up for routine testing of Vitamin D, at least 2-3 times per year to avoid over-replacement. We will recheck labs at her next in office visit.  - Vitamin D, Ergocalciferol, (DRISDOL) 1.25 MG (50000 UNIT) CAPS capsule; Take 1 capsule (50,000 Units total) by mouth every 7 (seven) days.  Dispense: 4 capsule; Refill: 0  2. Pre-diabetes Barbara Mcmahon will continue to work on weight loss, exercise, and decreasing simple carbohydrates to help decrease the risk of diabetes. We will recheck labs at her next in office visit.  3. Class 3 severe obesity with serious comorbidity and body mass index (BMI) of 45.0 to 49.9 in adult,  unspecified obesity type (HCC) Barbara Mcmahon is currently in the action stage of change. As such, her goal is to continue with weight loss efforts. She has agreed to the Category 4 Plan.   We will recheck IC at her next visit.  Behavioral modification strategies: increasing lean protein intake and decreasing simple carbohydrates.  Barbara Mcmahon has agreed to follow-up with our clinic in 3 to 4 weeks. She was informed of the importance of frequent follow-up visits to maximize her success with intensive lifestyle modifications for her multiple health conditions.   Objective:   Blood pressure 138/77, pulse 87, temperature 97.9 F (36.6 C), temperature source Oral, height 5\' 5"  (1.651 m), weight 289 lb (131.1 kg), SpO2 98 %. Body mass index is 48.09 kg/m.  General: Cooperative, alert, well developed, in no acute distress. HEENT: Conjunctivae and lids unremarkable. Cardiovascular: Regular rhythm.  Lungs: Normal work of breathing. Neurologic: No focal deficits.   Lab Results  Component Value Date   CREATININE 0.75 12/15/2019   BUN 11 12/15/2019   NA 138 12/15/2019   K 4.3 12/15/2019   CL 106 12/15/2019   CO2 27 12/15/2019   Lab Results  Component Value Date   ALT 17 12/15/2019   AST 17 12/15/2019   ALKPHOS 76 12/15/2019   BILITOT 0.4 12/15/2019   Lab Results  Component Value Date   HGBA1C 5.9 (H) 09/24/2019   HGBA1C 5.8 (H) 10/30/2018   Lab Results  Component Value Date   INSULIN 17.0 09/24/2019   INSULIN 10.3 10/30/2018   Lab Results  Component Value Date   TSH 2.280 10/30/2018  Lab Results  Component Value Date   CHOL 186 09/24/2019   HDL 46 09/24/2019   LDLCALC 119 (H) 09/24/2019   TRIG 118 09/24/2019   Lab Results  Component Value Date   WBC 6.3 12/15/2019   HGB 13.0 12/15/2019   HCT 39.3 12/15/2019   MCV 90.6 12/15/2019   PLT 335 12/15/2019   No results found for: IRON, TIBC, FERRITIN  Attestation Statements:   Reviewed by clinician on day of visit:  allergies, medications, problem list, medical history, surgical history, family history, social history, and previous encounter notes.   I, Trixie Dredge, am acting as transcriptionist for Dennard Nip, MD.  I have reviewed the above documentation for accuracy and completeness, and I agree with the above. -  Dennard Nip, MD

## 2020-01-06 ENCOUNTER — Encounter (INDEPENDENT_AMBULATORY_CARE_PROVIDER_SITE_OTHER): Payer: Self-pay | Admitting: Family Medicine

## 2020-01-07 ENCOUNTER — Encounter (INDEPENDENT_AMBULATORY_CARE_PROVIDER_SITE_OTHER): Payer: Self-pay | Admitting: Family Medicine

## 2020-01-07 DIAGNOSIS — M5387 Other specified dorsopathies, lumbosacral region: Secondary | ICD-10-CM | POA: Diagnosis not present

## 2020-01-07 DIAGNOSIS — M609 Myositis, unspecified: Secondary | ICD-10-CM | POA: Diagnosis not present

## 2020-01-07 DIAGNOSIS — M47817 Spondylosis without myelopathy or radiculopathy, lumbosacral region: Secondary | ICD-10-CM | POA: Diagnosis not present

## 2020-01-07 DIAGNOSIS — M5137 Other intervertebral disc degeneration, lumbosacral region: Secondary | ICD-10-CM | POA: Diagnosis not present

## 2020-01-07 NOTE — Telephone Encounter (Signed)
Please advise 

## 2020-01-22 DIAGNOSIS — M47817 Spondylosis without myelopathy or radiculopathy, lumbosacral region: Secondary | ICD-10-CM | POA: Diagnosis not present

## 2020-01-22 DIAGNOSIS — M5387 Other specified dorsopathies, lumbosacral region: Secondary | ICD-10-CM | POA: Diagnosis not present

## 2020-01-22 DIAGNOSIS — M609 Myositis, unspecified: Secondary | ICD-10-CM | POA: Diagnosis not present

## 2020-01-22 DIAGNOSIS — M5137 Other intervertebral disc degeneration, lumbosacral region: Secondary | ICD-10-CM | POA: Diagnosis not present

## 2020-01-26 ENCOUNTER — Other Ambulatory Visit: Payer: Self-pay

## 2020-01-26 ENCOUNTER — Encounter: Payer: Self-pay | Admitting: Medical-Surgical

## 2020-01-26 ENCOUNTER — Ambulatory Visit: Payer: BC Managed Care – PPO | Admitting: Medical-Surgical

## 2020-01-26 VITALS — BP 145/76 | HR 97 | Temp 98.1°F | Ht 65.0 in | Wt 294.3 lb

## 2020-01-26 DIAGNOSIS — L309 Dermatitis, unspecified: Secondary | ICD-10-CM

## 2020-01-26 DIAGNOSIS — L299 Pruritus, unspecified: Secondary | ICD-10-CM

## 2020-01-26 MED ORDER — METHYLPREDNISOLONE SODIUM SUCC 125 MG IJ SOLR: 125.0000 mg | Freq: Once | INTRAMUSCULAR | 0 refills | Status: DC

## 2020-01-26 MED ORDER — METHYLPREDNISOLONE SODIUM SUCC 125 MG IJ SOLR
125.0000 mg | Freq: Once | INTRAMUSCULAR | Status: AC
  Administered 2020-01-26: 125 mg via INTRAMUSCULAR

## 2020-01-26 MED ORDER — FAMOTIDINE 20 MG PO TABS: 20.0000 mg | ORAL_TABLET | Freq: Every day | ORAL | 1 refills | Status: DC

## 2020-01-26 MED ORDER — CLOTRIMAZOLE-BETAMETHASONE 1-0.05 % EX CREA: 1.0000 "application " | TOPICAL_CREAM | Freq: Two times a day (BID) | CUTANEOUS | 0 refills | Status: DC

## 2020-01-26 NOTE — Progress Notes (Signed)
Subjective:    CC: rash  HPI: Very pleasant 44 year old female presenting today with rash around her anterior neck that has been present for approximately 5 days.  Reports she does have a couple of places on her legs that seem to go down during the day but then return at night.  Endorses moderate to severe generalized itching.  Has a history of allergies that cause hives and itching but has not had any exposure to any known substances.  No new medications, cosmetics, materials, detergents, soaps.  Does not wear jewelry.  No outside/environmental exposures.  Does have a pet but it is 1 hypoallergenic dog that is based weekly and does not spend more than a couple of minutes outside at the time. Denies wheezing, shortness of breath, fever, chills.  Has been using hydroxyzine, Benadryl, 1% hydrocortisone cream with little relief.  Takes Zyrtec 10 mg every day, on this medication for years.  I reviewed the past medical history, family history, social history, surgical history, and allergies today and no changes were needed.  Please see the problem list section below in epic for further details.  Past Medical History: Past Medical History:  Diagnosis Date  . Abnormal Pap smear and cervical HPV (human papillomavirus)   . Anxiety   . Arthritis    BAck and sacral joint  . Back pain   . Colon polyps   . Constipation   . Gallbladder problem   . Generalized anxiety disorder   . IBS (irritable bowel syndrome)   . Infertility, female   . Joint pain   . Multiple food allergies    Shellfish, cinnamon and yeast  . Obesity   . PCOS (polycystic ovarian syndrome)   . Prediabetes   . Pulmonary embolism ([place])   . Seizure disorder ([place])   . Vitamin D deficiency    Past Surgical History: Past Surgical History:  Procedure Laterality Date  . BREAST BIOPSY Right 2019   benign  . CHOLECYSTECTOMY    . COLPOSCOPY    . DILATION AND CURETTAGE OF UTERUS    . GYNECOLOGIC CRYOSURGERY    . KNEE SURGERY   right   Social History: Social History   Socioeconomic History  . Marital status: Married    Spouse name: Kasiyah Place  . Number of children: 2  . Years of education: Not on file  . Highest education level: Not on file  Occupational History  . Occupation: Art gallery manager  Tobacco Use  . Smoking status: Never Smoker  . Smokeless tobacco: Never Used  Substance and Sexual Activity  . Alcohol use: No  . Drug use: No  . Sexual activity: Not Currently  Other Topics Concern  . Not on file  Social History Narrative  . Not on file   Social Determinants of Health   Financial Resource Strain:   . Difficulty of Paying Living Expenses:   Food Insecurity:   . Worried About Charity fundraiser in the Last Year:   . Arboriculturist in the Last Year:   Transportation Needs:   . Film/video editor (Medical):   Marland Kitchen Lack of Transportation (Non-Medical):   Physical Activity:   . Days of Exercise per Week:   . Minutes of Exercise per Session:   Stress:   . Feeling of Stress :   Social Connections:   . Frequency of Communication with Friends and Family:   . Frequency of Social Gatherings with Friends and Family:   . Attends Religious  Services:   . Active Member of Clubs or Organizations:   . Attends Archivist Meetings:   Marland Kitchen Marital Status:    Family History: Family History  Problem Relation Age of Onset  . Hypertension Father   . Depression Father   . Anxiety disorder Father   . Sleep apnea Father   . Obesity Father   . Diabetes Paternal Grandmother   . Hypertension Paternal Grandmother   . Stroke Paternal Grandmother   . Diabetes Paternal Grandfather   . Hypertension Paternal Grandfather   . Stroke Paternal Grandfather   . Hypertension Mother   . Depression Mother   . Anxiety disorder Mother   . Eating disorder Mother    Allergies: Allergies  Allergen Reactions  . Morphine And Related Other (See Comments), Hives and Rash    Big mood  changes BEHAVIOR CHANGES   . Shellfish Allergy Hives, Swelling and Anaphylaxis    Throat closes  . Codeine Hives and Itching    Pt says she can take percocet  . Clonazepam Itching  . Nutmeg Oil (Myristica Oil)   . Cinnamon   . Robitussin A-C [Guaifenesin-Codeine] Itching  . Tramadol Itching  . Yeast-Related Products Hives   Medications: See med rec.  Review of Systems: No fevers, chills, night sweats, weight loss, chest pain, or shortness of breath.   Objective:    General: Well Developed, well nourished, and in no acute distress.  Neuro: Alert and oriented x3.  HEENT: Normocephalic, atraumatic.  Skin: Warm and dry.  See clinical photo. Cardiac: Regular rate and rhythm, no murmurs rubs or gallops, no lower extremity edema.  Respiratory: Clear to auscultation bilaterally. Not using accessory muscles, speaking in full sentences.      Impression and Recommendations:    Dermatitis/Pruritis Unknown etiology: Coloration suspicious for hives but duration and pattern seem more consistent with contact dermatitis. Change from Zyrtec to either Xyzal, Allegra, or Claritin. Start Pepcid 20mg  daily. May continue to use Benadryl as needed for itching. Lotrisone cream to rash BID as needed. Checking CBC w/diff and Liver function. As we do not know what the specific etiology is, recommend paying close attention to potential causes and going through the process of elimination.  Return if symptoms worsen or fail to improve. ___________________________________________ Clearnce Sorrel, DNP, APRN, FNP-BC [facility]

## 2020-01-27 ENCOUNTER — Ambulatory Visit (INDEPENDENT_AMBULATORY_CARE_PROVIDER_SITE_OTHER): Payer: BC Managed Care – PPO | Admitting: Family Medicine

## 2020-01-27 ENCOUNTER — Other Ambulatory Visit: Payer: Self-pay

## 2020-01-27 ENCOUNTER — Encounter (INDEPENDENT_AMBULATORY_CARE_PROVIDER_SITE_OTHER): Payer: Self-pay | Admitting: Family Medicine

## 2020-01-27 VITALS — BP 147/71 | HR 98 | Temp 98.0°F | Ht 65.0 in | Wt 291.0 lb

## 2020-01-27 DIAGNOSIS — Z6841 Body Mass Index (BMI) 40.0 and over, adult: Secondary | ICD-10-CM

## 2020-01-27 DIAGNOSIS — Z9189 Other specified personal risk factors, not elsewhere classified: Secondary | ICD-10-CM | POA: Diagnosis not present

## 2020-01-27 DIAGNOSIS — E559 Vitamin D deficiency, unspecified: Secondary | ICD-10-CM | POA: Diagnosis not present

## 2020-01-27 DIAGNOSIS — R030 Elevated blood-pressure reading, without diagnosis of hypertension: Secondary | ICD-10-CM

## 2020-01-27 DIAGNOSIS — R7303 Prediabetes: Secondary | ICD-10-CM

## 2020-01-27 DIAGNOSIS — F3289 Other specified depressive episodes: Secondary | ICD-10-CM

## 2020-01-27 DIAGNOSIS — R0602 Shortness of breath: Secondary | ICD-10-CM | POA: Diagnosis not present

## 2020-01-27 DIAGNOSIS — E7800 Pure hypercholesterolemia, unspecified: Secondary | ICD-10-CM | POA: Diagnosis not present

## 2020-01-27 MED ORDER — BUPROPION HCL ER (SR) 150 MG PO TB12: 150.0000 mg | ORAL_TABLET | Freq: Every day | ORAL | 0 refills | Status: DC

## 2020-01-27 MED ORDER — VITAMIN D (ERGOCALCIFEROL) 1.25 MG (50000 UNIT) PO CAPS: 50000.0000 [IU] | ORAL_CAPSULE | ORAL | 0 refills | Status: DC

## 2020-01-28 LAB — HEMOGLOBIN A1C
Est. average glucose Bld gHb Est-mCnc: 123 mg/dL
Hgb A1c MFr Bld: 5.9 % — ABNORMAL HIGH (ref 4.8–5.6)

## 2020-01-28 LAB — COMPREHENSIVE METABOLIC PANEL
ALT: 21 IU/L (ref 0–32)
AST: 19 IU/L (ref 0–40)
Albumin/Globulin Ratio: 1.8 (ref 1.2–2.2)
Albumin: 4.4 g/dL (ref 3.8–4.8)
Alkaline Phosphatase: 100 IU/L (ref 39–117)
BUN/Creatinine Ratio: 20 (ref 9–23)
BUN: 12 mg/dL (ref 6–24)
Bilirubin Total: <0.2 mg/dL (ref 0.0–1.2)
CO2: 20 mmol/L (ref 20–29)
Calcium: 9.2 mg/dL (ref 8.7–10.2)
Chloride: 104 mmol/L (ref 96–106)
Creatinine, Ser: 0.6 mg/dL (ref 0.57–1.00)
GFR calc Af Amer: 128 mL/min/{1.73_m2} (ref >59)
GFR calc non Af Amer: 111 mL/min/{1.73_m2} (ref >59)
Globulin, Total: 2.4 g/dL (ref 1.5–4.5)
Glucose: 170 mg/dL — ABNORMAL HIGH (ref 65–99)
Potassium: 4.5 mmol/L (ref 3.5–5.2)
Sodium: 138 mmol/L (ref 134–144)
Total Protein: 6.8 g/dL (ref 6.0–8.5)

## 2020-01-28 LAB — LIPID PANEL WITH LDL/HDL RATIO
Cholesterol, Total: 204 mg/dL — ABNORMAL HIGH (ref 100–199)
HDL: 67 mg/dL (ref >39)
LDL Chol Calc (NIH): 125 mg/dL — ABNORMAL HIGH (ref 0–99)
LDL/HDL Ratio: 1.9 ratio (ref 0.0–3.2)
Triglycerides: 68 mg/dL (ref 0–149)
VLDL Cholesterol Cal: 12 mg/dL (ref 5–40)

## 2020-01-28 LAB — VITAMIN D 25 HYDROXY (VIT D DEFICIENCY, FRACTURES): Vit D, 25-Hydroxy: 24.6 ng/mL — ABNORMAL LOW (ref 30.0–100.0)

## 2020-01-28 LAB — INSULIN, RANDOM: INSULIN: 67.1 u[IU]/mL — ABNORMAL HIGH (ref 2.6–24.9)

## 2020-02-02 NOTE — Progress Notes (Signed)
Chief Complaint:   OBESITY Barbara Mcmahon is here to discuss her progress with her obesity treatment plan along with follow-up of her obesity related diagnoses. Barbara Mcmahon is on the Category 4 Plan and states she is following her eating plan approximately 50% of the time. Barbara Mcmahon states she is doing 0 minutes 0 times per week.  Today's visit was #: 24 Starting weight: 292 lbs Starting date: 10/30/2018 Today's weight: 291 lbs Today's date: 01/27/2020 Total lbs lost to date: 1 Total lbs lost since last in-office visit: 0  Interim History: Barbara Mcmahon has been struggling with weight loss and has mostly maintained her weight in out program, which she started right before the pandemic. Maintaining her weight is significantly better than the majority of americans who have gained weight in the same time. She would like her RMR retested today. She has had a rash recently and has been on steroids.  Subjective:   1. Vitamin D deficiency Barbara Mcmahon is stable on Vit D, and she is due for labs.  2. Pre-diabetes Barbara Mcmahon is working on diet and exercise. She is due to have labs rechecked.   3. Elevated blood pressure reading Barbara Mcmahon's blood pressure is elevated today, and may be related to recent steroid injections. Her blood pressure is normally well controlled.  4. Shortness of breath on exertion Barbara Mcmahon is still struggling with shortness of breath with activity. She is due to have her VO2 rechecked.  5. Other depression, with emotional eating Barbara Mcmahon is stabel on Cymbalta, but she still struggles with emotional eating.  6. At risk for hypertension The patient is at a higher than average risk of hypertension due to elevated blood pressure.  Assessment/Plan:   1. Vitamin D deficiency Low Vitamin D level contributes to fatigue and are associated with obesity, breast, and colon cancer. We will refill prescription Vitamin D for 1 month. Karmin will follow-up for routine testing of  Vitamin D, at least 2-3 times per year to avoid over-replacement. We will check labs today.  - VITAMIN D 25 Hydroxy (Vit-D Deficiency, Fractures)  - Vitamin D, Ergocalciferol, (DRISDOL) 1.25 MG (50000 UNIT) CAPS capsule; Take 1 capsule (50,000 Units total) by mouth every 7 (seven) days.  Dispense: 4 capsule; Refill: 0  2. Pre-diabetes Donae will continue with weight loss efforts, exercise, and decreasing simple carbohydrates to help decrease the risk of diabetes. We will check labs today.   - Comprehensive metabolic panel - Hemoglobin A1c - Insulin, random - Lipid Panel With LDL/HDL Ratio  3. Elevated blood pressure reading We will check labs today, and we will recheck Evalie's blood pressure in 2-3 weeks.  4. Shortness of breath on exertion Abbee's VO2 today is within normal limits. Her shortness of breath appears to be due to exercise intolerance. She has agreed to continue to work on diet and weight loss. Will continue to monitor closely.  5. Other depression, with emotional eating Behavior modification techniques were discussed today to help Barbara Mcmahon deal with her emotional/non-hunger eating behaviors. Barbara Mcmahon agreed to continue Cymbalta, and she agreed to start Wellbutrin SR 150 mg q AM with no refills. Orders and follow up as documented in patient record.   - buPROPion (WELLBUTRIN SR) 150 MG 12 hr tablet; Take 1 tablet (150 mg total) by mouth daily.  Dispense: 30 tablet; Refill: 0  6. At risk for hypertension Barbara Mcmahon was given approximately 15 minutes of hypertension prevention counseling today. Barbara Mcmahon is at risk for hypertension due to obesity and elevated blood pressure. We discussed  intensive lifestyle modifications today with an emphasis on weight loss as well as increasing exercise and decreasing salt intake.  Repetitive spaced learning was employed today to elicit superior memory formation and behavioral change.  7. Class 3 severe obesity with  serious comorbidity and body mass index (BMI) of 45.0 to 49.9 in adult, unspecified obesity type (HCC) Barbara Mcmahon is currently in the action stage of change. As such, her goal is to continue with weight loss efforts. She has agreed to keeping a food journal and adhering to recommended goals of 1500-1800 calories and 100+ grams of protein daily.   Behavioral modification strategies: increasing lean protein intake and keeping a strict food journal.  Barbara Mcmahon has agreed to follow-up with our clinic in 2 to 3 weeks. She was informed of the importance of frequent follow-up visits to maximize her success with intensive lifestyle modifications for her multiple health conditions.   Barbara Mcmahon was informed we would discuss her lab results at her next visit unless there is a critical issue that needs to be addressed sooner. Barbara Mcmahon agreed to keep her next visit at the agreed upon time to discuss these results.  Objective:   Blood pressure (!) 147/71, pulse 98, temperature 98 F (36.7 C), temperature source Oral, height 5\' 5"  (1.651 m), weight 291 lb (132 kg), SpO2 97 %. Body mass index is 48.42 kg/m.  General: Cooperative, alert, well developed, in no acute distress. HEENT: Conjunctivae and lids unremarkable. Cardiovascular: Regular rhythm.  Lungs: Normal work of breathing. Neurologic: No focal deficits.   Lab Results  Component Value Date   CREATININE 0.60 01/27/2020   BUN 12 01/27/2020   NA 138 01/27/2020   K 4.5 01/27/2020   CL 104 01/27/2020   CO2 20 01/27/2020   Lab Results  Component Value Date   ALT 21 01/27/2020   AST 19 01/27/2020   ALKPHOS 100 01/27/2020   BILITOT <0.2 01/27/2020   Lab Results  Component Value Date   HGBA1C 5.9 (H) 01/27/2020   HGBA1C 5.9 (H) 09/24/2019   HGBA1C 5.8 (H) 10/30/2018   Lab Results  Component Value Date   INSULIN 67.1 (H) 01/27/2020   INSULIN 17.0 09/24/2019   INSULIN 10.3 10/30/2018   Lab Results  Component Value Date   TSH 2.280  10/30/2018   Lab Results  Component Value Date   CHOL 204 (H) 01/27/2020   HDL 67 01/27/2020   LDLCALC 125 (H) 01/27/2020   TRIG 68 01/27/2020   Lab Results  Component Value Date   WBC 6.3 12/15/2019   HGB 13.0 12/15/2019   HCT 39.3 12/15/2019   MCV 90.6 12/15/2019   PLT 335 12/15/2019   No results found for: IRON, TIBC, FERRITIN  Attestation Statements:   Reviewed by clinician on day of visit: allergies, medications, problem list, medical history, surgical history, family history, social history, and previous encounter notes.   I, Trixie Dredge, am acting as transcriptionist for Dennard Nip, MD.  I have reviewed the above documentation for accuracy and completeness, and I agree with the above. -  Dennard Nip, MD

## 2020-02-08 DIAGNOSIS — F411 Generalized anxiety disorder: Secondary | ICD-10-CM | POA: Diagnosis not present

## 2020-02-09 DIAGNOSIS — H9202 Otalgia, left ear: Secondary | ICD-10-CM | POA: Diagnosis not present

## 2020-02-09 DIAGNOSIS — M5137 Other intervertebral disc degeneration, lumbosacral region: Secondary | ICD-10-CM | POA: Diagnosis not present

## 2020-02-09 DIAGNOSIS — M609 Myositis, unspecified: Secondary | ICD-10-CM | POA: Diagnosis not present

## 2020-02-09 DIAGNOSIS — M47817 Spondylosis without myelopathy or radiculopathy, lumbosacral region: Secondary | ICD-10-CM | POA: Diagnosis not present

## 2020-02-09 DIAGNOSIS — M5387 Other specified dorsopathies, lumbosacral region: Secondary | ICD-10-CM | POA: Diagnosis not present

## 2020-02-16 ENCOUNTER — Encounter: Payer: Self-pay | Admitting: Hematology & Oncology

## 2020-02-17 ENCOUNTER — Other Ambulatory Visit: Payer: Self-pay

## 2020-02-17 ENCOUNTER — Ambulatory Visit (INDEPENDENT_AMBULATORY_CARE_PROVIDER_SITE_OTHER): Payer: BC Managed Care – PPO | Admitting: Family Medicine

## 2020-02-17 ENCOUNTER — Encounter (INDEPENDENT_AMBULATORY_CARE_PROVIDER_SITE_OTHER): Payer: Self-pay | Admitting: Family Medicine

## 2020-02-17 ENCOUNTER — Encounter: Payer: Self-pay | Admitting: Family Medicine

## 2020-02-17 VITALS — BP 145/80 | HR 82 | Temp 98.0°F | Ht 65.0 in | Wt 293.0 lb

## 2020-02-17 DIAGNOSIS — E559 Vitamin D deficiency, unspecified: Secondary | ICD-10-CM | POA: Diagnosis not present

## 2020-02-17 DIAGNOSIS — F3289 Other specified depressive episodes: Secondary | ICD-10-CM

## 2020-02-17 DIAGNOSIS — Z9189 Other specified personal risk factors, not elsewhere classified: Secondary | ICD-10-CM | POA: Diagnosis not present

## 2020-02-17 DIAGNOSIS — R030 Elevated blood-pressure reading, without diagnosis of hypertension: Secondary | ICD-10-CM | POA: Diagnosis not present

## 2020-02-17 DIAGNOSIS — Z6841 Body Mass Index (BMI) 40.0 and over, adult: Secondary | ICD-10-CM

## 2020-02-17 MED ORDER — BUPROPION HCL ER (SR) 150 MG PO TB12: 150.0000 mg | ORAL_TABLET | Freq: Two times a day (BID) | ORAL | 0 refills | Status: DC

## 2020-02-17 MED ORDER — VITAMIN D (ERGOCALCIFEROL) 1.25 MG (50000 UNIT) PO CAPS: 50000.0000 [IU] | ORAL_CAPSULE | ORAL | 0 refills | Status: DC

## 2020-02-17 NOTE — Progress Notes (Signed)
Chief Complaint:   OBESITY Barbara Mcmahon is here to discuss her progress with her obesity treatment plan along with follow-up of her obesity related diagnoses. [name] is on keeping a food journal and adhering to recommended goals of 1500-1800 calories and 100+ grams of protein daily and states she is following her eating plan approximately 50% of the time. [name] states she is doing 0 minutes 0 times per week.  Today's visit was #: 25 Starting weight: 292 lbs Starting date: 10/30/2018 Today's weight: 293 lbs Today's date: 02/17/2020 Total lbs lost to date: 0 Total lbs lost since last in-office visit: 0  Interim History: [name] has had a very rough 3 weeks with work stress and family stress. She notes increased emotional eating during this time, and has started to see a counselor which she feels helps. She is ready to get back on track.  Subjective:   1. Vitamin D deficiency Barbara Mcmahon is stable on Vit D, but her level is not yet at goal.  2. Other depression with emotional eating Barbara Mcmahon notes increased stress and emotional eating recently. She is aware of her actions and is trying to make changes. She has a strong family history of ADHD, and her therapist wonders if she also has this diagnosis and treating this could help.  3. Blood pressure elevated without history of HTN Barbara Mcmahon's blood pressure was elevated today, which is unusual. She notes increased stress which is likely contributing.  4. At risk for hypertension The patient is at a higher than average risk of hypertension due to elevated blood pressure.  Assessment/Plan:   1. Vitamin D deficiency Low Vitamin D level contributes to fatigue and are associated with obesity, breast, and colon cancer. We will refill prescription Vitamin D for 1 month. [name] will follow-up for routine testing of Vitamin D, at least 2-3 times per year to avoid over-replacement.  - Vitamin D, Ergocalciferol, (DRISDOL) 1.25 MG  (50000 UNIT) CAPS capsule; Take 1 capsule (50,000 Units total) by mouth every 7 (seven) days.  Dispense: 4 capsule; Refill: 0  2. Other depression with emotional eating Behavior modification techniques were discussed today to help [name] deal with her emotional/non-hunger eating behaviors. [name] agreed to increase Wellbutrin SR to 150 mg BID with no refills, and will follow up in 2 weeks. Orders and follow up as documented in patient record.   - buPROPion (WELLBUTRIN SR) 150 MG 12 hr tablet; Take 1 tablet (150 mg total) by mouth 2 (two) times daily.  Dispense: 60 tablet; Refill: 0  3. Blood pressure elevated without history of HTN Barbara Mcmahon will continue with diet and exercise, and we will recheck her blood pressure in 2 weeks.  4. At risk for hypertension Barbara Mcmahon was given approximately 15 minutes of hypertension prevention counseling today. Barbara Mcmahon is at risk for hypertension due to obesity. We discussed intensive lifestyle modifications today with an emphasis on weight loss as well as increasing exercise and decreasing salt intake.  Repetitive spaced learning was employed today to elicit superior memory formation and behavioral change.  5. Class 3 severe obesity with serious comorbidity and body mass index (BMI) of 45.0 to 49.9 in adult, unspecified obesity type (HCC) Barbara Mcmahon is currently in the action stage of change. As such, her goal is to continue with weight loss efforts. She has agreed to the Category 4 Plan.   Behavioral modification strategies: emotional eating strategies and dealing with family or coworker sabotage.  Barbara Mcmahon has agreed to follow-up with our clinic in 2  weeks. She was informed of the importance of frequent follow-up visits to maximize her success with intensive lifestyle modifications for her multiple health conditions.   Objective:   Blood pressure (!) 145/80, pulse 82, temperature 98 F (36.7 C), temperature source Oral, height 5\' 5"  (1.651  m), weight 293 lb (132.9 kg), SpO2 98 %. Body mass index is 48.76 kg/m.  General: Cooperative, alert, well developed, in no acute distress. HEENT: Conjunctivae and lids unremarkable. Cardiovascular: Regular rhythm.  Lungs: Normal work of breathing. Neurologic: No focal deficits.   Lab Results  Component Value Date   CREATININE 0.60 01/27/2020   BUN 12 01/27/2020   NA 138 01/27/2020   K 4.5 01/27/2020   CL 104 01/27/2020   CO2 20 01/27/2020   Lab Results  Component Value Date   ALT 21 01/27/2020   AST 19 01/27/2020   ALKPHOS 100 01/27/2020   BILITOT <0.2 01/27/2020   Lab Results  Component Value Date   HGBA1C 5.9 (H) 01/27/2020   HGBA1C 5.9 (H) 09/24/2019   HGBA1C 5.8 (H) 10/30/2018   Lab Results  Component Value Date   INSULIN 67.1 (H) 01/27/2020   INSULIN 17.0 09/24/2019   INSULIN 10.3 10/30/2018   Lab Results  Component Value Date   TSH 2.280 10/30/2018   Lab Results  Component Value Date   CHOL 204 (H) 01/27/2020   HDL 67 01/27/2020   LDLCALC 125 (H) 01/27/2020   TRIG 68 01/27/2020   Lab Results  Component Value Date   WBC 6.3 12/15/2019   HGB 13.0 12/15/2019   HCT 39.3 12/15/2019   MCV 90.6 12/15/2019   PLT 335 12/15/2019   No results found for: IRON, TIBC, FERRITIN  Attestation Statements:   Reviewed by clinician on day of visit: allergies, medications, problem list, medical history, surgical history, family history, social history, and previous encounter notes.   I, Trixie Dredge, am acting as transcriptionist for Dennard Nip, MD.  I have reviewed the above documentation for accuracy and completeness, and I agree with the above. -  Dennard Nip, MD

## 2020-02-22 ENCOUNTER — Encounter: Payer: Self-pay | Admitting: Family Medicine

## 2020-02-22 DIAGNOSIS — R21 Rash and other nonspecific skin eruption: Secondary | ICD-10-CM | POA: Diagnosis not present

## 2020-02-22 DIAGNOSIS — L298 Other pruritus: Secondary | ICD-10-CM | POA: Diagnosis not present

## 2020-02-22 DIAGNOSIS — L308 Other specified dermatitis: Secondary | ICD-10-CM | POA: Diagnosis not present

## 2020-02-23 DIAGNOSIS — M47817 Spondylosis without myelopathy or radiculopathy, lumbosacral region: Secondary | ICD-10-CM | POA: Diagnosis not present

## 2020-02-23 DIAGNOSIS — M5137 Other intervertebral disc degeneration, lumbosacral region: Secondary | ICD-10-CM | POA: Diagnosis not present

## 2020-02-23 DIAGNOSIS — M609 Myositis, unspecified: Secondary | ICD-10-CM | POA: Diagnosis not present

## 2020-02-23 DIAGNOSIS — M5387 Other specified dorsopathies, lumbosacral region: Secondary | ICD-10-CM | POA: Diagnosis not present

## 2020-02-24 DIAGNOSIS — F411 Generalized anxiety disorder: Secondary | ICD-10-CM | POA: Diagnosis not present

## 2020-02-29 ENCOUNTER — Telehealth: Payer: Self-pay

## 2020-02-29 ENCOUNTER — Telehealth: Payer: Self-pay | Admitting: Family Medicine

## 2020-02-29 MED ORDER — LISDEXAMFETAMINE DIMESYLATE 30 MG PO CAPS: 30.0000 mg | ORAL_CAPSULE | Freq: Every day | ORAL | 0 refills | Status: DC

## 2020-02-29 NOTE — Telephone Encounter (Signed)
Dr Eustaquio Boyden called and I read the below message as written. She states that was all she needed. She will also follow up with Manjit in 1 month. I called and advised Madalyn of the prescription. She states it has been held up at the pharmacy. I did speak with Lavonna Rua and she did receive a PA for the Vyvanse. Vega has been scheduled.

## 2020-02-29 NOTE — Telephone Encounter (Signed)
See other telephone message 

## 2020-02-29 NOTE — Telephone Encounter (Signed)
Received notes from psychiatrist Dr. Is still hung.  Recently diagnosed with ADHD and they are recommending a trial of stimulant medication and discontinue the Wellbutrin which was initially started to help improve focus and concentration as well as some sexual dysfunction.  I did go ahead and send over Vyvanse 30 mg per recommendation.  She will need to follow-up with me in 1 month so that we can see how she is doing before any additional refills so that way we can make any adjustments needed.

## 2020-02-29 NOTE — Telephone Encounter (Signed)
Received fax for PA on Vyvanse sent through cover my meds and received authorization.   Case Id: [id number] Valid: 02/29/2020 - 02/28/2021 - CF

## 2020-03-02 ENCOUNTER — Encounter (INDEPENDENT_AMBULATORY_CARE_PROVIDER_SITE_OTHER): Payer: Self-pay | Admitting: Family Medicine

## 2020-03-02 ENCOUNTER — Other Ambulatory Visit: Payer: Self-pay

## 2020-03-02 ENCOUNTER — Ambulatory Visit (INDEPENDENT_AMBULATORY_CARE_PROVIDER_SITE_OTHER): Payer: BC Managed Care – PPO | Admitting: Family Medicine

## 2020-03-02 VITALS — BP 137/81 | HR 89 | Temp 97.8°F | Ht 65.0 in | Wt 294.0 lb

## 2020-03-02 DIAGNOSIS — E559 Vitamin D deficiency, unspecified: Secondary | ICD-10-CM

## 2020-03-02 DIAGNOSIS — R7303 Prediabetes: Secondary | ICD-10-CM | POA: Diagnosis not present

## 2020-03-02 DIAGNOSIS — Z6841 Body Mass Index (BMI) 40.0 and over, adult: Secondary | ICD-10-CM

## 2020-03-02 DIAGNOSIS — E7849 Other hyperlipidemia: Secondary | ICD-10-CM | POA: Diagnosis not present

## 2020-03-02 NOTE — Progress Notes (Signed)
Chief Complaint:   OBESITY Barbara Mcmahon is here to discuss her progress with her obesity treatment plan along with follow-up of her obesity related diagnoses. Barbara Mcmahon is on the Category 4 Plan and states she is following her eating plan approximately 70% of the time. Barbara Mcmahon states she is doing yoga for 30 minutes 3 times per week.  Today's visit was #: 34 Starting weight: 292 lbs Starting date: 10/30/2018 Today's weight: 294 lbs Today's date: 03/02/2020 Total lbs lost to date: 0 Total lbs lost since last in-office visit: 0  Interim History: Barbara Mcmahon has been struggling with staying on the plan with dinner protein. She has experienced increased stress with "work drama", and she has an appointment with her therapist next week. 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  Subjective:   1. Vitamin D deficiency Barbara Mcmahon's Vit D level on 01/27/2020 was 24.6. She is on prescription strength Vit D supplementation.  2. Pre-diabetes Barbara Mcmahon's A1c on 01/27/2020 was 5.9 and insulin level was 67.1. She denies excessive hunger levels.  3. Other hyperlipidemia Barbara Mcmahon's lipid panel on 01/27/2020, showed a total cholesterol of 204, triglycerides 68, HDL 67, and LDL 125. She is not on statin therapy.  Assessment/Plan:   1. Vitamin D deficiency Low Vitamin D level contributes to fatigue and are associated with obesity, breast, and colon cancer. Barbara Mcmahon agreed to continue taking prescription Vitamin D 50,000 IU every week, no refill needed today. She will follow-up for routine testing of Vitamin D, at least 2-3 times per year to avoid over-replacement.  2. Pre-diabetes Barbara Mcmahon will continue her Category 4 meal plan, and will continue to work on weight loss, exercise, and decreasing simple carbohydrates to help decrease the risk of diabetes. We will recheck labs every 3 months.  3. Other hyperlipidemia Cardiovascular risk and specific lipid/LDL goals  reviewed. We discussed several lifestyle modifications today and Barbara Mcmahon will continue her Category 4 meal plan, and will continue to work on regular exercise and weight loss efforts. Orders and follow up as documented in patient record.   4. Class 3 severe obesity with serious comorbidity and body mass index (BMI) of 45.0 to 49.9 in adult, unspecified obesity type (HCC) Barbara Mcmahon is currently in the action stage of change. As such, her goal is to continue with weight loss efforts. She has agreed to the Category 4 Plan.   Handout provided today: Protein Equivalents sheet.  Exercise goals: As is.  Behavioral modification strategies: increasing lean protein intake, decreasing simple carbohydrates, no skipping meals and meal planning and cooking strategies.  Barbara Mcmahon has agreed to follow-up with our clinic in 3 weeks. She was informed of the importance of frequent follow-up visits to maximize her success with intensive lifestyle modifications for her multiple health conditions.   Objective:   Blood pressure 137/81, pulse 89, temperature 97.8 F (36.6 C), temperature source Oral, height 5\' 5"  (1.651 m), weight 294 lb (133.4 kg), SpO2 97 %. Body mass index is 48.92 kg/m.  General: Cooperative, alert, well developed, in no acute distress. HEENT: Conjunctivae and lids unremarkable. Cardiovascular: Regular rhythm.  Lungs: Normal work of breathing. Neurologic: No focal deficits.   Lab Results  Component Value Date   CREATININE 0.60 01/27/2020   BUN 12 01/27/2020   NA 138 01/27/2020   K 4.5 01/27/2020   CL 104 01/27/2020   CO2 20 01/27/2020   Lab Results  Component Value Date   ALT 21 01/27/2020   AST 19 01/27/2020  ALKPHOS 100 01/27/2020   BILITOT <0.2 01/27/2020   Lab Results  Component Value Date   HGBA1C 5.9 (H) 01/27/2020   HGBA1C 5.9 (H) 09/24/2019   HGBA1C 5.8 (H) 10/30/2018   Lab Results  Component Value Date   INSULIN 67.1 (H) 01/27/2020   INSULIN 17.0 09/24/2019    INSULIN 10.3 10/30/2018   Lab Results  Component Value Date   TSH 2.280 10/30/2018   Lab Results  Component Value Date   CHOL 204 (H) 01/27/2020   HDL 67 01/27/2020   LDLCALC 125 (H) 01/27/2020   TRIG 68 01/27/2020   Lab Results  Component Value Date   WBC 6.3 12/15/2019   HGB 13.0 12/15/2019   HCT 39.3 12/15/2019   MCV 90.6 12/15/2019   PLT 335 12/15/2019   No results found for: IRON, TIBC, FERRITIN  Attestation Statements:   Reviewed by clinician on day of visit: allergies, medications, problem list, medical history, surgical history, family history, social history, and previous encounter notes.  Time spent on visit including pre-visit chart review and post-visit care and charting was 27 minutes.   I, Trixie Dredge, am acting as transcriptionist for Dennard Nip, MD.  I have reviewed the above documentation for accuracy and completeness, and I agree with the above. -  Dennard Nip, MD

## 2020-03-07 DIAGNOSIS — F411 Generalized anxiety disorder: Secondary | ICD-10-CM | POA: Diagnosis not present

## 2020-03-10 DIAGNOSIS — M5137 Other intervertebral disc degeneration, lumbosacral region: Secondary | ICD-10-CM | POA: Diagnosis not present

## 2020-03-10 DIAGNOSIS — M5387 Other specified dorsopathies, lumbosacral region: Secondary | ICD-10-CM | POA: Diagnosis not present

## 2020-03-10 DIAGNOSIS — M609 Myositis, unspecified: Secondary | ICD-10-CM | POA: Diagnosis not present

## 2020-03-10 DIAGNOSIS — M47817 Spondylosis without myelopathy or radiculopathy, lumbosacral region: Secondary | ICD-10-CM | POA: Diagnosis not present

## 2020-03-20 ENCOUNTER — Other Ambulatory Visit (INDEPENDENT_AMBULATORY_CARE_PROVIDER_SITE_OTHER): Payer: Self-pay | Admitting: Family Medicine

## 2020-03-20 DIAGNOSIS — F3289 Other specified depressive episodes: Secondary | ICD-10-CM

## 2020-03-21 DIAGNOSIS — F411 Generalized anxiety disorder: Secondary | ICD-10-CM | POA: Diagnosis not present

## 2020-03-23 ENCOUNTER — Other Ambulatory Visit: Payer: Self-pay

## 2020-03-23 ENCOUNTER — Other Ambulatory Visit: Payer: Self-pay | Admitting: Medical-Surgical

## 2020-03-23 ENCOUNTER — Ambulatory Visit (INDEPENDENT_AMBULATORY_CARE_PROVIDER_SITE_OTHER): Payer: BC Managed Care – PPO | Admitting: Family Medicine

## 2020-03-23 ENCOUNTER — Encounter (INDEPENDENT_AMBULATORY_CARE_PROVIDER_SITE_OTHER): Payer: Self-pay | Admitting: Family Medicine

## 2020-03-23 VITALS — BP 132/62 | HR 103 | Temp 98.1°F | Ht 65.0 in | Wt 290.0 lb

## 2020-03-23 DIAGNOSIS — E559 Vitamin D deficiency, unspecified: Secondary | ICD-10-CM | POA: Diagnosis not present

## 2020-03-23 DIAGNOSIS — L309 Dermatitis, unspecified: Secondary | ICD-10-CM

## 2020-03-23 DIAGNOSIS — Z9189 Other specified personal risk factors, not elsewhere classified: Secondary | ICD-10-CM | POA: Diagnosis not present

## 2020-03-23 DIAGNOSIS — R7303 Prediabetes: Secondary | ICD-10-CM

## 2020-03-23 DIAGNOSIS — Z6841 Body Mass Index (BMI) 40.0 and over, adult: Secondary | ICD-10-CM

## 2020-03-23 DIAGNOSIS — F3289 Other specified depressive episodes: Secondary | ICD-10-CM

## 2020-03-23 MED ORDER — VITAMIN D (ERGOCALCIFEROL) 1.25 MG (50000 UNIT) PO CAPS: 50000.0000 [IU] | ORAL_CAPSULE | ORAL | 0 refills | Status: DC

## 2020-03-24 ENCOUNTER — Encounter: Payer: Self-pay | Admitting: Family Medicine

## 2020-03-24 ENCOUNTER — Ambulatory Visit: Payer: BC Managed Care – PPO | Admitting: Family Medicine

## 2020-03-24 VITALS — BP 132/62 | HR 94 | Ht 65.0 in | Wt 290.0 lb

## 2020-03-24 DIAGNOSIS — R21 Rash and other nonspecific skin eruption: Secondary | ICD-10-CM | POA: Diagnosis not present

## 2020-03-24 DIAGNOSIS — F902 Attention-deficit hyperactivity disorder, combined type: Secondary | ICD-10-CM | POA: Diagnosis not present

## 2020-03-24 DIAGNOSIS — M609 Myositis, unspecified: Secondary | ICD-10-CM | POA: Diagnosis not present

## 2020-03-24 DIAGNOSIS — Z6841 Body Mass Index (BMI) 40.0 and over, adult: Secondary | ICD-10-CM | POA: Diagnosis not present

## 2020-03-24 DIAGNOSIS — M5387 Other specified dorsopathies, lumbosacral region: Secondary | ICD-10-CM | POA: Diagnosis not present

## 2020-03-24 DIAGNOSIS — M47817 Spondylosis without myelopathy or radiculopathy, lumbosacral region: Secondary | ICD-10-CM | POA: Diagnosis not present

## 2020-03-24 DIAGNOSIS — M5137 Other intervertebral disc degeneration, lumbosacral region: Secondary | ICD-10-CM | POA: Diagnosis not present

## 2020-03-24 MED ORDER — LISDEXAMFETAMINE DIMESYLATE 40 MG PO CAPS: 40.0000 mg | ORAL_CAPSULE | Freq: Every day | ORAL | 0 refills | Status: DC

## 2020-03-24 NOTE — Telephone Encounter (Signed)
Routing to PCP

## 2020-03-24 NOTE — Progress Notes (Signed)
Established Patient Office Visit  Subjective:  Patient ID: Barbara Mcmahon, female    DOB: 1976/04/08  Age: 44 y.o. MRN: [id number]  CC:  Chief Complaint  Patient presents with  . ADHD    HPI Barbara Mcmahon presents for ADHD, relatively new diagnosis by Dr. Truett Perna who has recommend tx with a stimulant medication. She has recently started Vyvanse 30mg  she has been doing well. Denies any CP, SOB or palpitation. No tremor or sleep problems. Says she has noticed some more awareness around her but not sure it is made a difference overall. Would like to try a higher dose if possible. She says her BP yesterday was at goal.   Also has some questions about her husband today.  He had COVID and has had major changes in his ability to perform executive functions.   Psychiatry has increased her Cymbalta to 60mg  BID from 30MG  BID.    Has been dealing with a rash on her neck.  Wasn't improving with topicals and H2 blocker. Saw dermatology and they did a punch bx.  Told it was a contact deramatitis but can' figure out cause.  Uses dey free perfume free products.   Past Medical History:  Diagnosis Date  . Abnormal Pap smear and cervical HPV (human papillomavirus)   . Anxiety   . Arthritis    BAck and sacral joint  . Back pain   . Colon polyps   . Constipation   . Gallbladder problem   . Generalized anxiety disorder   . IBS (irritable bowel syndrome)   . Infertility, female   . Joint pain   . Multiple food allergies    Shellfish, cinnamon and yeast  . Obesity   . PCOS (polycystic ovarian syndrome)   . Prediabetes   . Pulmonary embolism ([place])   . Seizure disorder ([place])   . Vitamin D deficiency     Past Surgical History:  Procedure Laterality Date  . BREAST BIOPSY Right 2019   benign  . CHOLECYSTECTOMY    . COLPOSCOPY    . DILATION AND CURETTAGE OF UTERUS    . GYNECOLOGIC CRYOSURGERY    . KNEE SURGERY  right    Family History  Problem Relation Age of Onset  .  Hypertension Father   . Depression Father   . Anxiety disorder Father   . Sleep apnea Father   . Obesity Father   . Diabetes Paternal Grandmother   . Hypertension Paternal Grandmother   . Stroke Paternal Grandmother   . Diabetes Paternal Grandfather   . Hypertension Paternal Grandfather   . Stroke Paternal Grandfather   . Hypertension Mother   . Depression Mother   . Anxiety disorder Mother   . Eating disorder Mother     Social History   Socioeconomic History  . Marital status: Married    Spouse name: Sokha Craker  . Number of children: 2  . Years of education: Not on file  . Highest education level: Not on file  Occupational History  . Occupation: Art gallery manager  Tobacco Use  . Smoking status: Never Smoker  . Smokeless tobacco: Never Used  Vaping Use  . Vaping Use: Never used  Substance and Sexual Activity  . Alcohol use: No  . Drug use: No  . Sexual activity: Not Currently  Other Topics Concern  . Not on file  Social History Narrative  . Not on file   Social Determinants of Health   Financial Resource Strain:   .  Difficulty of Paying Living Expenses:   Food Insecurity:   . Worried About Charity fundraiser in the Last Year:   . Arboriculturist in the Last Year:   Transportation Needs:   . Film/video editor (Medical):   Marland Kitchen Lack of Transportation (Non-Medical):   Physical Activity:   . Days of Exercise per Week:   . Minutes of Exercise per Session:   Stress:   . Feeling of Stress :   Social Connections:   . Frequency of Communication with Friends and Family:   . Frequency of Social Gatherings with Friends and Family:   . Attends Religious Services:   . Active Member of Clubs or Organizations:   . Attends Archivist Meetings:   Marland Kitchen Marital Status:   Intimate Partner Violence:   . Fear of Current or Ex-Partner:   . Emotionally Abused:   Marland Kitchen Physically Abused:   . Sexually Abused:     Outpatient Medications Prior to  Visit  Medication Sig Dispense Refill  . acetaminophen (TYLENOL) 500 MG tablet Take 500 mg by mouth every 6 (six) hours as needed.    Marland Kitchen apixaban (ELIQUIS) 5 MG TABS tablet Take 1 tablet (5 mg total) by mouth 2 (two) times daily. 180 tablet 3  . clotrimazole-betamethasone (LOTRISONE) cream Apply 1 application topically 2 (two) times daily. 45 g 0  . CRANBERRY PO Take by mouth daily.    . cyclobenzaprine (FLEXERIL) 10 MG tablet Take 10 mg by mouth 3 (three) times daily as needed.     Marland Kitchen EPINEPHrine (EPI-PEN) 0.3 mg/0.3 mL SOAJ injection Inject 0.3 mLs (0.3 mg total) into the muscle once as needed (anaphylaxis). 1 Device 1  . Melatonin 1 MG CAPS Take 1 capsule by mouth at bedtime.    . Multiple Vitamin (MULTI-VITAMIN) tablet Take by mouth.    . Vitamin D, Ergocalciferol, (DRISDOL) 1.25 MG (50000 UNIT) CAPS capsule Take 1 capsule (50,000 Units total) by mouth every 7 (seven) days. 4 capsule 0  . famotidine (PEPCID) 20 MG tablet Take 1 tablet (20 mg total) by mouth daily. 30 tablet 1  . hydrOXYzine (ATARAX/VISTARIL) 10 MG tablet Take 60 mg by mouth at bedtime.     . hydrOXYzine (ATARAX/VISTARIL) 50 MG tablet Take 60 mg by mouth daily.     Marland Kitchen lisdexamfetamine (VYVANSE) 30 MG capsule Take 1 capsule (30 mg total) by mouth daily. 30 capsule 0  . DULoxetine (CYMBALTA) 60 MG capsule Take by mouth. Take 2 capsules by mouth daily    . levocetirizine (XYZAL) 5 MG tablet Take 5 mg by mouth every evening.     No facility-administered medications prior to visit.    Allergies  Allergen Reactions  . Cinnamon Shortness Of Breath and Rash    Wheezing   . Morphine And Related Other (See Comments), Hives and Rash    Big mood changes BEHAVIOR CHANGES   . Nutmeg Oil (Myristica Oil) Shortness Of Breath and Rash    wheezing  . Shellfish Allergy Hives, Swelling and Anaphylaxis    Throat closes  . Codeine Hives and Itching    Pt says she can take percocet  . Clonazepam Itching  . Robitussin A-C  [Guaifenesin-Codeine] Itching  . Tramadol Itching  . Yeast-Related Products Hives    ROS Review of Systems    Objective:    Physical Exam Constitutional:      Appearance: She is well-developed.  HENT:     Head: Normocephalic and atraumatic.  Cardiovascular:  Rate and Rhythm: Normal rate and regular rhythm.     Heart sounds: Normal heart sounds.  Pulmonary:     Effort: Pulmonary effort is normal.     Breath sounds: Normal breath sounds.  Skin:    General: Skin is warm and dry.     Comments: erythematous macular rash around her neck.   Neurological:     Mental Status: She is alert and oriented to person, place, and time.  Psychiatric:        Behavior: Behavior normal.     BP 132/62   Pulse 94   Ht 5\' 5"  (1.651 m)   Wt 290 lb (131.5 kg)   SpO2 98%   BMI 48.26 kg/m  Wt Readings from Last 3 Encounters:  03/24/20 290 lb (131.5 kg)  03/23/20 290 lb (131.5 kg)  03/02/20 294 lb (133.4 kg)     Health Maintenance Due  Topic Date Due  . Hepatitis C Screening  Never done  . TETANUS/TDAP  01/12/2020    There are no preventive care reminders to display for this patient.  Lab Results  Component Value Date   TSH 2.280 10/30/2018   Lab Results  Component Value Date   WBC 6.3 12/15/2019   HGB 13.0 12/15/2019   HCT 39.3 12/15/2019   MCV 90.6 12/15/2019   PLT 335 12/15/2019   Lab Results  Component Value Date   NA 138 01/27/2020   K 4.5 01/27/2020   CO2 20 01/27/2020   GLUCOSE 170 (H) 01/27/2020   BUN 12 01/27/2020   CREATININE 0.60 01/27/2020   BILITOT <0.2 01/27/2020   ALKPHOS 100 01/27/2020   AST 19 01/27/2020   ALT 21 01/27/2020   PROT 6.8 01/27/2020   ALBUMIN 4.4 01/27/2020   CALCIUM 9.2 01/27/2020   ANIONGAP 5 12/15/2019   Lab Results  Component Value Date   CHOL 204 (H) 01/27/2020   Lab Results  Component Value Date   HDL 67 01/27/2020   Lab Results  Component Value Date   LDLCALC 125 (H) 01/27/2020   Lab Results  Component Value  Date   TRIG 68 01/27/2020   No results found for: CHOLHDL Lab Results  Component Value Date   HGBA1C 5.9 (H) 01/27/2020      Assessment & Plan:   Problem List Items Addressed This Visit      Other   Class 3 severe obesity with serious comorbidity and body mass index (BMI) of 45.0 to 49.9 in adult [facility])    She has been excited she is seeing the scale move. She had plateaued for some time and says lately she has made some changes and it doing well.        Relevant Medications   lisdexamfetamine (VYVANSE) 40 MG capsule   Attention deficit hyperactivity disorder (ADHD), combined type - Primary    Dsicussed options. Will try a higher dose of Vyvanse 40mg .  F/U in 1 months. Ok for virtual,  Keep an eye on BP.        Other Visit Diagnoses    Rash          Rash - unable to pinpoint cause.  If pollen triggered then should improved over next month or two.  Offered allergy referral.  OK ot D/C Pepcid.    Meds ordered this encounter  Medications  . lisdexamfetamine (VYVANSE) 40 MG capsule    Sig: Take 1 capsule (40 mg total) by mouth daily.    Dispense:  30 capsule  Refill:  0    Follow-up: Return in about 4 weeks (around 04/21/2020) for ADHD.    Beatrice Lecher, MD

## 2020-03-24 NOTE — Assessment & Plan Note (Signed)
She has been excited she is seeing the scale move. She had plateaued for some time and says lately she has made some changes and it doing well.

## 2020-03-24 NOTE — Assessment & Plan Note (Signed)
Dsicussed options. Will try a higher dose of Vyvanse 40mg .  F/U in 1 months. Ok for virtual,  Keep an eye on BP.

## 2020-03-28 NOTE — Progress Notes (Signed)
Chief Complaint:   OBESITY Barbara Mcmahon is here to discuss her progress with her obesity treatment plan along with follow-up of her obesity related diagnoses. Barbara Mcmahon is on the Category 4 Plan and states she is following her eating plan approximately 90% of the time. Barbara Mcmahon states she is doing yoga for 30 minutes 3 times per week.  Today's visit was #: 29 Starting weight: 292 lbs Starting date: 10/30/2018 Today's weight: 290 lbs Today's date: 03/23/2020 Total lbs lost to date: 2 Total lbs lost since last in-office visit: 4  Interim History: Barbara Mcmahon notes breakfast and lunch are easy to follow, and dinner she occasionally skips. Her hunger and cravings will controlled. She has been having a vegetable, and fruit smoothie with 40 grams of protein powder in the afternoon lately. She is taken it off her snack calories and still deals with negative relationships with food and a lot of guilt over what she eats.  Subjective:   1. Vitamin D deficiency Barbara Mcmahon denies concerns with medications and she denies side effects.  2. Pre-diabetes Barbara Mcmahon's last A1c was checked in [place] and was at 5.9.   3. Other depression with emotional eating Barbara Mcmahon is working with a Social worker and she goes regularly. She is doing well, and she denies suicidal ideas.  4. At risk for diabetes mellitus Barbara Mcmahon is at higher than average risk for developing diabetes due to her obesity.   Assessment/Plan:   1. Vitamin D deficiency Low Vitamin D level contributes to fatigue and are associated with obesity, breast, and colon cancer. We will refill prescription Vitamin D for 1 month.  will follow-up for routine testing of Vitamin D, at least 2-3 times per year to avoid over-replacement.  - Vitamin D, Ergocalciferol, (DRISDOL) 1.25 MG (50000 UNIT) CAPS capsule; Take 1 capsule (50,000 Units total) by mouth every 7 (seven) days.  Dispense: 4 capsule; Refill: 0  2. Pre-diabetes Barbara Mcmahon will continue to  work on weight loss, diet, exercise, and decreasing simple carbohydrates to help decrease the risk of diabetes. We will recheck labs at her next office visit.   3. Other depression with emotional eating Behavior modification techniques were discussed today to help Shaylie deal with her emotional/non-hunger eating behaviors. Barbara Mcmahon will continue Cymbalta at her current dose, and will continue exercise, diet, and weight loss. Orders and follow up as documented in patient record.   4. At risk for diabetes mellitus Barbara Mcmahon was given approximately 15 minutes of diabetes education and counseling today. We discussed intensive lifestyle modifications today with an emphasis on weight loss as well as increasing exercise and decreasing simple carbohydrates in her diet. We also reviewed medication options with an emphasis on risk versus benefit of those discussed.   Repetitive spaced learning was employed today to elicit superior memory formation and behavioral change.  5. Class 3 severe obesity with serious comorbidity and body mass index (BMI) of 45.0 to 49.9 in adult, unspecified obesity type (HCC) Barbara Mcmahon is currently in the action stage of change. As such, her goal is to continue with weight loss efforts. She has agreed to the Category 4 Plan.   Exercise goals: As is.  Behavioral modification strategies: no skipping meals and meal planning and cooking strategies.  Barbara Mcmahon has agreed to follow-up with our clinic in 2 to 3 weeks. She was informed of the importance of frequent follow-up visits to maximize her success with intensive lifestyle modifications for her multiple health conditions.   Objective:   Blood pressure 132/62, pulse (!) 103,  temperature 98.1 F (36.7 C), temperature source Oral, height 5\' 5"  (1.651 m), weight 290 lb (131.5 kg), SpO2 98 %. Body mass index is 48.26 kg/m.  General: Cooperative, alert, well developed, in no acute distress. HEENT: Conjunctivae and lids  unremarkable. Cardiovascular: Regular rhythm.  Lungs: Normal work of breathing. Neurologic: No focal deficits.   Lab Results  Component Value Date   CREATININE 0.60 01/27/2020   BUN 12 01/27/2020   NA 138 01/27/2020   K 4.5 01/27/2020   CL 104 01/27/2020   CO2 20 01/27/2020   Lab Results  Component Value Date   ALT 21 01/27/2020   AST 19 01/27/2020   ALKPHOS 100 01/27/2020   BILITOT <0.2 01/27/2020   Lab Results  Component Value Date   HGBA1C 5.9 (H) 01/27/2020   HGBA1C 5.9 (H) 09/24/2019   HGBA1C 5.8 (H) 10/30/2018   Lab Results  Component Value Date   INSULIN 67.1 (H) 01/27/2020   INSULIN 17.0 09/24/2019   INSULIN 10.3 10/30/2018   Lab Results  Component Value Date   TSH 2.280 10/30/2018   Lab Results  Component Value Date   CHOL 204 (H) 01/27/2020   HDL 67 01/27/2020   LDLCALC 125 (H) 01/27/2020   TRIG 68 01/27/2020   Lab Results  Component Value Date   WBC 6.3 12/15/2019   HGB 13.0 12/15/2019   HCT 39.3 12/15/2019   MCV 90.6 12/15/2019   PLT 335 12/15/2019   No results found for: IRON, TIBC, FERRITIN  Attestation Statements:   Reviewed by clinician on day of visit: allergies, medications, problem list, medical history, surgical history, family history, social history, and previous encounter notes.   I, Trixie Dredge, am acting as transcriptionist for Dennard Nip, MD.  I have reviewed the above documentation for accuracy and completeness, and I agree with the above. -

## 2020-04-04 DIAGNOSIS — F411 Generalized anxiety disorder: Secondary | ICD-10-CM | POA: Diagnosis not present

## 2020-04-07 DIAGNOSIS — M5387 Other specified dorsopathies, lumbosacral region: Secondary | ICD-10-CM | POA: Diagnosis not present

## 2020-04-07 DIAGNOSIS — M47817 Spondylosis without myelopathy or radiculopathy, lumbosacral region: Secondary | ICD-10-CM | POA: Diagnosis not present

## 2020-04-07 DIAGNOSIS — M609 Myositis, unspecified: Secondary | ICD-10-CM | POA: Diagnosis not present

## 2020-04-07 DIAGNOSIS — M5137 Other intervertebral disc degeneration, lumbosacral region: Secondary | ICD-10-CM | POA: Diagnosis not present

## 2020-04-18 DIAGNOSIS — F411 Generalized anxiety disorder: Secondary | ICD-10-CM | POA: Diagnosis not present

## 2020-04-20 ENCOUNTER — Encounter (INDEPENDENT_AMBULATORY_CARE_PROVIDER_SITE_OTHER): Payer: Self-pay | Admitting: Family Medicine

## 2020-04-20 ENCOUNTER — Other Ambulatory Visit: Payer: Self-pay

## 2020-04-20 ENCOUNTER — Ambulatory Visit (INDEPENDENT_AMBULATORY_CARE_PROVIDER_SITE_OTHER): Payer: BC Managed Care – PPO | Admitting: Family Medicine

## 2020-04-20 VITALS — BP 139/84 | HR 88 | Temp 98.3°F | Ht 65.0 in | Wt 293.0 lb

## 2020-04-20 DIAGNOSIS — Z6841 Body Mass Index (BMI) 40.0 and over, adult: Secondary | ICD-10-CM

## 2020-04-20 DIAGNOSIS — E559 Vitamin D deficiency, unspecified: Secondary | ICD-10-CM

## 2020-04-20 DIAGNOSIS — Z9189 Other specified personal risk factors, not elsewhere classified: Secondary | ICD-10-CM

## 2020-04-20 DIAGNOSIS — R7303 Prediabetes: Secondary | ICD-10-CM | POA: Diagnosis not present

## 2020-04-20 DIAGNOSIS — E7849 Other hyperlipidemia: Secondary | ICD-10-CM | POA: Diagnosis not present

## 2020-04-20 MED ORDER — VITAMIN D (ERGOCALCIFEROL) 1.25 MG (50000 UNIT) PO CAPS: 50000.0000 [IU] | ORAL_CAPSULE | ORAL | 0 refills | Status: DC

## 2020-04-21 DIAGNOSIS — M5137 Other intervertebral disc degeneration, lumbosacral region: Secondary | ICD-10-CM | POA: Diagnosis not present

## 2020-04-21 DIAGNOSIS — M609 Myositis, unspecified: Secondary | ICD-10-CM | POA: Diagnosis not present

## 2020-04-21 DIAGNOSIS — M47817 Spondylosis without myelopathy or radiculopathy, lumbosacral region: Secondary | ICD-10-CM | POA: Diagnosis not present

## 2020-04-21 DIAGNOSIS — M5387 Other specified dorsopathies, lumbosacral region: Secondary | ICD-10-CM | POA: Diagnosis not present

## 2020-04-21 LAB — LIPID PANEL WITH LDL/HDL RATIO
Cholesterol, Total: 193 mg/dL (ref 100–199)
HDL: 48 mg/dL (ref >39)
LDL Chol Calc (NIH): 127 mg/dL — ABNORMAL HIGH (ref 0–99)
LDL/HDL Ratio: 2.6 ratio (ref 0.0–3.2)
Triglycerides: 98 mg/dL (ref 0–149)
VLDL Cholesterol Cal: 18 mg/dL (ref 5–40)

## 2020-04-21 LAB — INSULIN, RANDOM: INSULIN: 16.4 u[IU]/mL (ref 2.6–24.9)

## 2020-04-21 LAB — COMPREHENSIVE METABOLIC PANEL
ALT: 19 IU/L (ref 0–32)
AST: 19 IU/L (ref 0–40)
Albumin/Globulin Ratio: 1.4 (ref 1.2–2.2)
Albumin: 4.1 g/dL (ref 3.8–4.8)
Alkaline Phosphatase: 96 IU/L (ref 48–121)
BUN/Creatinine Ratio: 21 (ref 9–23)
BUN: 14 mg/dL (ref 6–24)
Bilirubin Total: 0.4 mg/dL (ref 0.0–1.2)
CO2: 25 mmol/L (ref 20–29)
Calcium: 8.7 mg/dL (ref 8.7–10.2)
Chloride: 104 mmol/L (ref 96–106)
Creatinine, Ser: 0.67 mg/dL (ref 0.57–1.00)
GFR calc Af Amer: 123 mL/min/{1.73_m2} (ref >59)
GFR calc non Af Amer: 106 mL/min/{1.73_m2} (ref >59)
Globulin, Total: 2.9 g/dL (ref 1.5–4.5)
Glucose: 103 mg/dL — ABNORMAL HIGH (ref 65–99)
Potassium: 4.6 mmol/L (ref 3.5–5.2)
Sodium: 141 mmol/L (ref 134–144)
Total Protein: 7 g/dL (ref 6.0–8.5)

## 2020-04-21 LAB — VITAMIN D 25 HYDROXY (VIT D DEFICIENCY, FRACTURES): Vit D, 25-Hydroxy: 29.1 ng/mL — ABNORMAL LOW (ref 30.0–100.0)

## 2020-04-21 LAB — HEMOGLOBIN A1C
Est. average glucose Bld gHb Est-mCnc: 120 mg/dL
Hgb A1c MFr Bld: 5.8 % — ABNORMAL HIGH (ref 4.8–5.6)

## 2020-04-21 NOTE — Progress Notes (Signed)
Chief Complaint:   OBESITY Barbara Mcmahon is here to discuss her progress with her obesity treatment plan along with follow-up of her obesity related diagnoses. Barbara Mcmahon is on the Category 4 Plan and states she is following her eating plan approximately 90% of the time. Barbara Mcmahon states she is exercising in the swimming pool for 120 minutes 5 times per week.  Today's visit was #: 28 Starting weight: 292 lbs Starting date: 10/30/2018 Today's weight: 293 lbs Today's date: 04/20/2020 Total lbs lost to date: 0 Total lbs lost since last in-office visit: 0  Interim History: Barbara Mcmahon is stable on her Category 4 plan, but she is retaining fluid today. She has increased stress which may also be contributing.  Subjective:   1. Vitamin D deficiency Barbara Mcmahon is stable on Vit D, and she is due to have labs.  2. Pre-diabetes Barbara Mcmahon is working on diet and exercise, and she is due to have labs.  3. Other hyperlipidemia Barbara Mcmahon is working on diet to improve, and she is due to have labs.  4. At risk for heart disease Barbara Mcmahon is at a higher than average risk for cardiovascular disease due to obesity.   Assessment/Plan:   1. Vitamin D deficiency Low Vitamin D level contributes to fatigue and are associated with obesity, breast, and colon cancer. We will check labs today, and we will refill prescription Vitamin D for 1 month. [place] will follow-up for routine testing of Vitamin D, at least 2-3 times per year to avoid over-replacement.  - Vitamin D, Ergocalciferol, (DRISDOL) 1.25 MG (50000 UNIT) CAPS capsule; Take 1 capsule (50,000 Units total) by mouth every 7 (seven) days.  Dispense: 4 capsule; Refill: 0 - VITAMIN D 25 Hydroxy (Vit-D Deficiency, Fractures)  2. Pre-diabetes Barbara Mcmahon will continue to work on weight loss, diet, exercise, and decreasing simple carbohydrates to help decrease the risk of diabetes. We will check labs today.  - Comprehensive metabolic panel - Hemoglobin  A1c - Insulin, random  3. Other hyperlipidemia Cardiovascular risk and specific lipid/LDL goals reviewed. We discussed several lifestyle modifications today and Barbara Mcmahon will continue to work on diet, exercise and weight loss efforts. We will check labs today. Orders and follow up as documented in patient record.   - Comprehensive metabolic panel - Lipid Panel With LDL/HDL Ratio  4. At risk for heart disease Barbara Mcmahon was given approximately 15 minutes of coronary artery disease prevention counseling today. She is 44 y.o. female and has risk factors for heart disease including obesity. We discussed intensive lifestyle modifications today with an emphasis on specific weight loss instructions and strategies.   Repetitive spaced learning was employed today to elicit superior memory formation and behavioral change.  5. Class 3 severe obesity with serious comorbidity and body mass index (BMI) of 45.0 to 49.9 in adult, unspecified obesity type (HCC) Barbara Mcmahon is currently in the action stage of change. As such, her goal is to continue with weight loss efforts. She has agreed to the Category 4 Plan.   Exercise goals: As is.  Behavioral modification strategies: emotional eating strategies.  Barbara Mcmahon has agreed to follow-up with our clinic in 3 weeks. She was informed of the importance of frequent follow-up visits to maximize her success with intensive lifestyle modifications for her multiple health conditions.   Barbara Mcmahon was informed we would discuss her lab results at her next visit unless there is a critical issue that needs to be addressed sooner. Barbara Mcmahon agreed to keep her next visit at the agreed upon time to  discuss these results.  Objective:   Blood pressure 139/84, pulse 88, temperature 98.3 F (36.8 C), temperature source Oral, height 5\' 5"  (1.651 m), weight 293 lb (132.9 kg), SpO2 98 %. Body mass index is 48.76 kg/m.  General: Cooperative, alert, well developed, in no acute  distress. HEENT: Conjunctivae and lids unremarkable. Cardiovascular: Regular rhythm.  Lungs: Normal work of breathing. Neurologic: No focal deficits.   Lab Results  Component Value Date   CREATININE 0.67 04/20/2020   BUN 14 04/20/2020   NA 141 04/20/2020   K 4.6 04/20/2020   CL 104 04/20/2020   CO2 25 04/20/2020   Lab Results  Component Value Date   ALT 19 04/20/2020   AST 19 04/20/2020   ALKPHOS 96 04/20/2020   BILITOT 0.4 04/20/2020   Lab Results  Component Value Date   HGBA1C 5.8 (H) 04/20/2020   HGBA1C 5.9 (H) 01/27/2020   HGBA1C 5.9 (H) 09/24/2019   HGBA1C 5.8 (H) 10/30/2018   Lab Results  Component Value Date   INSULIN 16.4 04/20/2020   INSULIN 67.1 (H) 01/27/2020   INSULIN 17.0 09/24/2019   INSULIN 10.3 10/30/2018   Lab Results  Component Value Date   TSH 2.280 10/30/2018   Lab Results  Component Value Date   CHOL 193 04/20/2020   HDL 48 04/20/2020   LDLCALC 127 (H) 04/20/2020   TRIG 98 04/20/2020   Lab Results  Component Value Date   WBC 6.3 12/15/2019   HGB 13.0 12/15/2019   HCT 39.3 12/15/2019   MCV 90.6 12/15/2019   PLT 335 12/15/2019   No results found for: IRON, TIBC, FERRITIN  Attestation Statements:   Reviewed by clinician on day of visit: allergies, medications, problem list, medical history, surgical history, family history, social history, and previous encounter notes.   I, Trixie Dredge, am acting as transcriptionist for Dennard Nip, MD.  I have reviewed the above documentation for accuracy and completeness, and I agree with the above. -  Dennard Nip, MD

## 2020-05-01 ENCOUNTER — Encounter: Payer: Self-pay | Admitting: Family Medicine

## 2020-05-02 MED ORDER — LISDEXAMFETAMINE DIMESYLATE 40 MG PO CAPS: 40.0000 mg | ORAL_CAPSULE | Freq: Every day | ORAL | 0 refills | Status: DC

## 2020-05-02 NOTE — Telephone Encounter (Signed)
Rx pended.  Last OV - 03/24/20 Last written - 03/24/20

## 2020-05-05 DIAGNOSIS — M609 Myositis, unspecified: Secondary | ICD-10-CM | POA: Diagnosis not present

## 2020-05-05 DIAGNOSIS — M5387 Other specified dorsopathies, lumbosacral region: Secondary | ICD-10-CM | POA: Diagnosis not present

## 2020-05-05 DIAGNOSIS — M47817 Spondylosis without myelopathy or radiculopathy, lumbosacral region: Secondary | ICD-10-CM | POA: Diagnosis not present

## 2020-05-05 DIAGNOSIS — M5137 Other intervertebral disc degeneration, lumbosacral region: Secondary | ICD-10-CM | POA: Diagnosis not present

## 2020-05-12 ENCOUNTER — Other Ambulatory Visit: Payer: Self-pay

## 2020-05-12 ENCOUNTER — Encounter (INDEPENDENT_AMBULATORY_CARE_PROVIDER_SITE_OTHER): Payer: Self-pay | Admitting: Family Medicine

## 2020-05-12 ENCOUNTER — Ambulatory Visit (INDEPENDENT_AMBULATORY_CARE_PROVIDER_SITE_OTHER): Payer: BC Managed Care – PPO | Admitting: Family Medicine

## 2020-05-12 VITALS — BP 132/83 | HR 72 | Temp 98.7°F | Ht 65.0 in | Wt 293.0 lb

## 2020-05-12 DIAGNOSIS — Z6841 Body Mass Index (BMI) 40.0 and over, adult: Secondary | ICD-10-CM

## 2020-05-12 DIAGNOSIS — E559 Vitamin D deficiency, unspecified: Secondary | ICD-10-CM

## 2020-05-12 DIAGNOSIS — Z9189 Other specified personal risk factors, not elsewhere classified: Secondary | ICD-10-CM

## 2020-05-12 DIAGNOSIS — E66813 Obesity, class 3: Secondary | ICD-10-CM

## 2020-05-12 DIAGNOSIS — R7303 Prediabetes: Secondary | ICD-10-CM

## 2020-05-12 MED ORDER — METFORMIN HCL 500 MG PO TABS: 500.0000 mg | ORAL_TABLET | Freq: Every day | ORAL | 0 refills | Status: DC

## 2020-05-12 MED ORDER — VITAMIN D (ERGOCALCIFEROL) 1.25 MG (50000 UNIT) PO CAPS: 50000.0000 [IU] | ORAL_CAPSULE | ORAL | 0 refills | Status: DC

## 2020-05-12 NOTE — Progress Notes (Signed)
Chief Complaint:   OBESITY Barbara Mcmahon is here to discuss her progress with her obesity treatment plan along with follow-up of her obesity related diagnoses. Barbara Mcmahon is on the Category 4 Plan and states she is following her eating plan approximately 65% of the time. Barbara Mcmahon states she is in the pool for 45 minutes 3 times per week.  Today's visit was #: 24 Starting weight: 292 lbs Starting date: 10/30/2018 Today's weight: 293 lbs Today's date: 05/12/2020 Total lbs lost to date: 0 Total lbs lost since last in-office visit: 0  Interim History: Barbara Mcmahon has done well maintaining her weight. She just closed on a house and life is hectic and she has been skipping dinner at times. Then she wakes up in the middle of the night hungry.  Subjective:   1. Pre-diabetes Barbara Mcmahon notes increased polyphagia, but her A1c and insulin is improving. I discussed labs with the patient today.  2. Vitamin D deficiency Barbara Mcmahon's Vit D level is slowly improving but not yet at goal. I discussed labs with the patient today.  3. At risk for impaired metabolic function Barbara Mcmahon is at increased risk for impaired metabolic function due to skipped meals.  Assessment/Plan:   1. Pre-diabetes Barbara Mcmahon will continue to work on weight loss, exercise, and decreasing simple carbohydrates to help decrease the risk of diabetes. Barbara Mcmahon agreed to start metformin 500 mg q AM with food, with no refills.  - metFORMIN (GLUCOPHAGE) 500 MG tablet; Take 1 tablet (500 mg total) by mouth daily with breakfast.  Dispense: 30 tablet; Refill: 0  2. Vitamin D deficiency Low Vitamin D level contributes to fatigue and are associated with obesity, breast, and colon cancer. We will refill prescription Vitamin D for 1 month. Barbara Mcmahon will follow-up for routine testing of Vitamin D, at least 2-3 times per year to avoid over-replacement.  - Vitamin D, Ergocalciferol, (DRISDOL) 1.25 MG (50000 UNIT) CAPS capsule; Take 1 capsule  (50,000 Units total) by mouth every 7 (seven) days.  Dispense: 4 capsule; Refill: 0  3. At risk for impaired metabolic function Barbara Mcmahon was given approximately 15 minutes of impaired  metabolic function prevention counseling today. We discussed intensive lifestyle modifications today with an emphasis on specific nutrition and exercise instructions and strategies.   Repetitive spaced learning was employed today to elicit superior memory formation and behavioral change.  4. Class 3 severe obesity with serious comorbidity and body mass index (BMI) of 45.0 to 49.9 in adult, unspecified obesity type (HCC) Barbara Mcmahon is currently in the action stage of change. As such, her goal is to continue with weight loss efforts. She has agreed to the Category 4 Plan.   Exercise goals: As is.  Behavioral modification strategies: increasing lean protein intake and no skipping meals.  Barbara Mcmahon has agreed to follow-up with our clinic in 3 to 4 weeks. She was informed of the importance of frequent follow-up visits to maximize her success with intensive lifestyle modifications for her multiple health conditions.   Objective:   Blood pressure 132/83, pulse 72, temperature 98.7 F (37.1 C), temperature source Oral, height 5\' 5"  (1.651 m), weight 293 lb (132.9 kg), SpO2 96 %. Body mass index is 48.76 kg/m.  General: Cooperative, alert, well developed, in no acute distress. HEENT: Conjunctivae and lids unremarkable. Cardiovascular: Regular rhythm.  Lungs: Normal work of breathing. Neurologic: No focal deficits.   Lab Results  Component Value Date   CREATININE 0.67 04/20/2020   BUN 14 04/20/2020   NA 141 04/20/2020   K  4.6 04/20/2020   CL 104 04/20/2020   CO2 25 04/20/2020   Lab Results  Component Value Date   ALT 19 04/20/2020   AST 19 04/20/2020   ALKPHOS 96 04/20/2020   BILITOT 0.4 04/20/2020   Lab Results  Component Value Date   HGBA1C 5.8 (H) 04/20/2020   HGBA1C 5.9 (H) 01/27/2020    HGBA1C 5.9 (H) 09/24/2019   HGBA1C 5.8 (H) 10/30/2018   Lab Results  Component Value Date   INSULIN 16.4 04/20/2020   INSULIN 67.1 (H) 01/27/2020   INSULIN 17.0 09/24/2019   INSULIN 10.3 10/30/2018   Lab Results  Component Value Date   TSH 2.280 10/30/2018   Lab Results  Component Value Date   CHOL 193 04/20/2020   HDL 48 04/20/2020   LDLCALC 127 (H) 04/20/2020   TRIG 98 04/20/2020   Lab Results  Component Value Date   WBC 6.3 12/15/2019   HGB 13.0 12/15/2019   HCT 39.3 12/15/2019   MCV 90.6 12/15/2019   PLT 335 12/15/2019   No results found for: IRON, TIBC, FERRITIN  Attestation Statements:   Reviewed by clinician on day of visit: allergies, medications, problem list, medical history, surgical history, family history, social history, and previous encounter notes.   I, Trixie Dredge, am acting as transcriptionist for Dennard Nip, MD.  I have reviewed the above documentation for accuracy and completeness, and I agree with the above. - Dennard Nip, MD

## 2020-05-16 DIAGNOSIS — F411 Generalized anxiety disorder: Secondary | ICD-10-CM | POA: Diagnosis not present

## 2020-05-19 DIAGNOSIS — M5387 Other specified dorsopathies, lumbosacral region: Secondary | ICD-10-CM | POA: Diagnosis not present

## 2020-05-19 DIAGNOSIS — M47817 Spondylosis without myelopathy or radiculopathy, lumbosacral region: Secondary | ICD-10-CM | POA: Diagnosis not present

## 2020-05-19 DIAGNOSIS — M609 Myositis, unspecified: Secondary | ICD-10-CM | POA: Diagnosis not present

## 2020-05-19 DIAGNOSIS — M5137 Other intervertebral disc degeneration, lumbosacral region: Secondary | ICD-10-CM | POA: Diagnosis not present

## 2020-05-20 ENCOUNTER — Ambulatory Visit: Payer: BC Managed Care – PPO | Admitting: Nurse Practitioner

## 2020-05-20 ENCOUNTER — Encounter: Payer: Self-pay | Admitting: Nurse Practitioner

## 2020-05-20 VITALS — BP 117/97 | HR 92 | Temp 98.2°F | Ht 60.0 in | Wt 301.0 lb

## 2020-05-20 DIAGNOSIS — B9689 Other specified bacterial agents as the cause of diseases classified elsewhere: Secondary | ICD-10-CM | POA: Diagnosis not present

## 2020-05-20 DIAGNOSIS — J019 Acute sinusitis, unspecified: Secondary | ICD-10-CM

## 2020-05-20 MED ORDER — DOXYCYCLINE HYCLATE 100 MG PO TABS: 100.0000 mg | ORAL_TABLET | Freq: Two times a day (BID) | ORAL | 0 refills | Status: DC

## 2020-05-20 MED ORDER — FLUCONAZOLE 150 MG PO TABS: 150.0000 mg | ORAL_TABLET | Freq: Every day | ORAL | 1 refills | Status: DC

## 2020-05-20 NOTE — Patient Instructions (Signed)
Sinusitis, Adult Sinusitis is inflammation of your sinuses. Sinuses are hollow spaces in the bones around your face. Your sinuses are located:  Around your eyes.  In the middle of your forehead.  Behind your nose.  In your cheekbones. Mucus normally drains out of your sinuses. When your nasal tissues become inflamed or swollen, mucus can become trapped or blocked. This allows bacteria, viruses, and fungi to grow, which leads to infection. Most infections of the sinuses are caused by a virus. Sinusitis can develop quickly. It can last for up to 4 weeks (acute) or for more than 12 weeks (chronic). Sinusitis often develops after a cold. What are the causes? This condition is caused by anything that creates swelling in the sinuses or stops mucus from draining. This includes:  Allergies.  Asthma.  Infection from bacteria or viruses.  Deformities or blockages in your nose or sinuses.  Abnormal growths in the nose (nasal polyps).  Pollutants, such as chemicals or irritants in the air.  Infection from fungi (rare). What increases the risk? You are more likely to develop this condition if you:  Have a weak body defense system (immune system).  Do a lot of swimming or diving.  Overuse nasal sprays.  Smoke. What are the signs or symptoms? The main symptoms of this condition are pain and a feeling of pressure around the affected sinuses. Other symptoms include:  Stuffy nose or congestion.  Thick drainage from your nose.  Swelling and warmth over the affected sinuses.  Headache.  Upper toothache.  A cough that may get worse at night.  Extra mucus that collects in the throat or the back of the nose (postnasal drip).  Decreased sense of smell and taste.  Fatigue.  A fever.  Sore throat.  Bad breath. How is this diagnosed? This condition is diagnosed based on:  Your symptoms.  Your medical history.  A physical exam.  Tests to find out if your condition is  acute or chronic. This may include: ? Checking your nose for nasal polyps. ? Viewing your sinuses using a device that has a light (endoscope). ? Testing for allergies or bacteria. ? Imaging tests, such as an MRI or CT scan. In rare cases, a bone biopsy may be done to rule out more serious types of fungal sinus disease. How is this treated? Treatment for sinusitis depends on the cause and whether your condition is chronic or acute.  If caused by a virus, your symptoms should go away on their own within 10 days. You may be given medicines to relieve symptoms. They include: ? Medicines that shrink swollen nasal passages (topical intranasal decongestants). ? Medicines that treat allergies (antihistamines). ? A spray that eases inflammation of the nostrils (topical intranasal corticosteroids). ? Rinses that help get rid of thick mucus in your nose (nasal saline washes).  If caused by bacteria, your health care provider may recommend waiting to see if your symptoms improve. Most bacterial infections will get better without antibiotic medicine. You may be given antibiotics if you have: ? A severe infection. ? A weak immune system.  If caused by narrow nasal passages or nasal polyps, you may need to have surgery. Follow these instructions at home: Medicines  Take, use, or apply over-the-counter and prescription medicines only as told by your health care provider. These may include nasal sprays.  If you were prescribed an antibiotic medicine, take it as told by your health care provider. Do not stop taking the antibiotic even if you start   to feel better. Hydrate and humidify   Drink enough fluid to keep your urine pale yellow. Staying hydrated will help to thin your mucus.  Use a cool mist humidifier to keep the humidity level in your home above 50%.  Inhale steam for 10-15 minutes, 3-4 times a day, or as told by your health care provider. You can do this in the bathroom while a hot shower is  running.  Limit your exposure to cool or dry air. Rest  Rest as much as possible.  Sleep with your head raised (elevated).  Make sure you get enough sleep each night. General instructions   Apply a warm, moist washcloth to your face 3-4 times a day or as told by your health care provider. This will help with discomfort.  Wash your hands often with soap and water to reduce your exposure to germs. If soap and water are not available, use hand sanitizer.  Do not smoke. Avoid being around people who are smoking (secondhand smoke).  Keep all follow-up visits as told by your health care provider. This is important. Contact a health care provider if:  You have a fever.  Your symptoms get worse.  Your symptoms do not improve within 10 days. Get help right away if:  You have a severe headache.  You have persistent vomiting.  You have severe pain or swelling around your face or eyes.  You have vision problems.  You develop confusion.  Your neck is stiff.  You have trouble breathing. Summary  Sinusitis is soreness and inflammation of your sinuses. Sinuses are hollow spaces in the bones around your face.  This condition is caused by nasal tissues that become inflamed or swollen. The swelling traps or blocks the flow of mucus. This allows bacteria, viruses, and fungi to grow, which leads to infection.  If you were prescribed an antibiotic medicine, take it as told by your health care provider. Do not stop taking the antibiotic even if you start to feel better.  Keep all follow-up visits as told by your health care provider. This is important. This information is not intended to replace advice given to you by your health care provider. Make sure you discuss any questions you have with your health care provider. Document Revised: 02/24/2018 Document Reviewed: 02/24/2018 Elsevier Patient Education  2020 Elsevier Inc.  

## 2020-05-20 NOTE — Progress Notes (Signed)
Acute Office Visit  Subjective:    Patient ID: Barbara Mcmahon, female    DOB: 1976/08/02, 44 y.o.   MRN: [id number]  Chief Complaint  Patient presents with  . Sinus Problem    HPI Patient is in today for symptoms of sinusitis. She reports her symptoms started about 2 weeks ago with allergy type symptoms- she does have chronic allergy issues. Over the past 2 weeks her symptoms have worsened to left sided sinus pain and pressure, post nasal drip, sinus drainage, and sore throat.   She denies tooth pain, fever, or cough.   She did have COVID in February of this year and she reports that she is having some intermittent shortness of breath with exertion, but she feels that is ongoing since COVID. She denies any dizziness or difficulty breathing.  Past Medical History:  Diagnosis Date  . Abnormal Pap smear and cervical HPV (human papillomavirus)   . Anxiety   . Arthritis    BAck and sacral joint  . Back pain   . Colon polyps   . Constipation   . Gallbladder problem   . Generalized anxiety disorder   . IBS (irritable bowel syndrome)   . Infertility, female   . Joint pain   . Multiple food allergies    Shellfish, cinnamon and yeast  . Obesity   . PCOS (polycystic ovarian syndrome)   . Prediabetes   . Pulmonary embolism ([place])   . Seizure disorder ([place])   . Vitamin D deficiency     Past Surgical History:  Procedure Laterality Date  . BREAST BIOPSY Right 2019   benign  . CHOLECYSTECTOMY    . COLPOSCOPY    . DILATION AND CURETTAGE OF UTERUS    . GYNECOLOGIC CRYOSURGERY    . KNEE SURGERY  right    Family History  Problem Relation Age of Onset  . Hypertension Father   . Depression Father   . Anxiety disorder Father   . Sleep apnea Father   . Obesity Father   . Diabetes Paternal Grandmother   . Hypertension Paternal Grandmother   . Stroke Paternal Grandmother   . Diabetes Paternal Grandfather   . Hypertension Paternal Grandfather   . Stroke Paternal Grandfather   .  Hypertension Mother   . Depression Mother   . Anxiety disorder Mother   . Eating disorder Mother     Social History   Socioeconomic History  . Marital status: Married    Spouse name: Treniya Lobb  . Number of children: 2  . Years of education: Not on file  . Highest education level: Not on file  Occupational History  . Occupation: Art gallery manager  Tobacco Use  . Smoking status: Never Smoker  . Smokeless tobacco: Never Used  Vaping Use  . Vaping Use: Never used  Substance and Sexual Activity  . Alcohol use: No  . Drug use: No  . Sexual activity: Not Currently  Other Topics Concern  . Not on file  Social History Narrative  . Not on file   Social Determinants of Health   Financial Resource Strain:   . Difficulty of Paying Living Expenses:   Food Insecurity:   . Worried About Charity fundraiser in the Last Year:   . Arboriculturist in the Last Year:   Transportation Needs:   . Film/video editor (Medical):   Marland Kitchen Lack of Transportation (Non-Medical):   Physical Activity:   . Days of Exercise per Week:   .  Minutes of Exercise per Session:   Stress:   . Feeling of Stress :   Social Connections:   . Frequency of Communication with Friends and Family:   . Frequency of Social Gatherings with Friends and Family:   . Attends Religious Services:   . Active Member of Clubs or Organizations:   . Attends Archivist Meetings:   Marland Kitchen Marital Status:   Intimate Partner Violence:   . Fear of Current or Ex-Partner:   . Emotionally Abused:   Marland Kitchen Physically Abused:   . Sexually Abused:     Outpatient Medications Prior to Visit  Medication Sig Dispense Refill  . acetaminophen (TYLENOL) 500 MG tablet Take 500 mg by mouth every 6 (six) hours as needed.    Marland Kitchen apixaban (ELIQUIS) 5 MG TABS tablet Take 1 tablet (5 mg total) by mouth 2 (two) times daily. 180 tablet 3  . clotrimazole-betamethasone (LOTRISONE) cream Apply 1 application topically 2 (two) times  daily. 45 g 0  . CRANBERRY PO Take by mouth daily.    . cyclobenzaprine (FLEXERIL) 10 MG tablet Take 10 mg by mouth 3 (three) times daily as needed.     . DULoxetine (CYMBALTA) 60 MG capsule Take by mouth. Take 2 capsules by mouth daily    . hydrOXYzine (ATARAX/VISTARIL) 10 MG tablet Take 60 mg by mouth at bedtime.    Marland Kitchen lisdexamfetamine (VYVANSE) 40 MG capsule Take 1 capsule (40 mg total) by mouth daily. 30 capsule 0  . Melatonin 1 MG CAPS Take 1 capsule by mouth at bedtime.    . Multiple Vitamin (MULTI-VITAMIN) tablet Take by mouth.    . Vitamin D, Ergocalciferol, (DRISDOL) 1.25 MG (50000 UNIT) CAPS capsule Take 1 capsule (50,000 Units total) by mouth every 7 (seven) days. 4 capsule 0  . EPINEPHrine (EPI-PEN) 0.3 mg/0.3 mL SOAJ injection Inject 0.3 mLs (0.3 mg total) into the muscle once as needed (anaphylaxis). (Patient not taking: Reported on 05/20/2020) 1 Device 1  . metFORMIN (GLUCOPHAGE) 500 MG tablet Take 1 tablet (500 mg total) by mouth daily with breakfast. (Patient not taking: Reported on 05/20/2020) 30 tablet 0   No facility-administered medications prior to visit.    Allergies  Allergen Reactions  . Cinnamon Shortness Of Breath and Rash    Wheezing   . Morphine And Related Other (See Comments), Hives and Rash    Big mood changes BEHAVIOR CHANGES   . Nutmeg Oil (Myristica Oil) Shortness Of Breath and Rash    wheezing  . Shellfish Allergy Hives, Swelling and Anaphylaxis    Throat closes  . Codeine Hives and Itching    Pt says she can take percocet  . Clonazepam Itching  . Robitussin A-C [Guaifenesin-Codeine] Itching  . Tramadol Itching  . Yeast-Related Products Hives      Objective:    Physical Exam Vitals and nursing note reviewed.  Constitutional:      Appearance: Normal appearance. She is obese.  HENT:     Head: Normocephalic.     Comments: Tenderness to the frontal and maxillary sinuses L>R    Right Ear: Ear canal and external ear normal. A middle ear effusion  is present. Tympanic membrane is bulging.     Left Ear: Ear canal and external ear normal. A middle ear effusion is present. Tympanic membrane is bulging.     Nose: Nose normal.     Mouth/Throat:     Mouth: Mucous membranes are moist.     Pharynx: Oropharynx is clear. Posterior oropharyngeal  erythema present. No oropharyngeal exudate.  Eyes:     Extraocular Movements: Extraocular movements intact.     Conjunctiva/sclera: Conjunctivae normal.     Pupils: Pupils are equal, round, and reactive to light.  Cardiovascular:     Rate and Rhythm: Normal rate and regular rhythm.     Pulses: Normal pulses.     Heart sounds: Normal heart sounds.  Pulmonary:     Effort: Pulmonary effort is normal. No respiratory distress.     Breath sounds: Normal breath sounds. No wheezing or rhonchi.  Abdominal:     General: Abdomen is flat. Bowel sounds are normal.     Palpations: Abdomen is soft.  Musculoskeletal:     Cervical back: Normal range of motion.     Right lower leg: No edema.     Left lower leg: No edema.  Lymphadenopathy:     Cervical: Cervical adenopathy present.  Skin:    General: Skin is warm and dry.     Capillary Refill: Capillary refill takes less than 2 seconds.  Neurological:     General: No focal deficit present.     Mental Status: She is alert and oriented to person, place, and time.  Psychiatric:        Mood and Affect: Mood normal.        Behavior: Behavior normal.        Thought Content: Thought content normal.        Judgment: Judgment normal.     BP (!) 117/97   Pulse 92   Temp 98.2 F (36.8 C) (Oral)   Ht 5' (1.524 m)   Wt (!) 301 lb (136.5 kg)   SpO2 98%   BMI 58.79 kg/m  Wt Readings from Last 3 Encounters:  05/20/20 (!) 301 lb (136.5 kg)  05/12/20 293 lb (132.9 kg)  04/20/20 293 lb (132.9 kg)    Health Maintenance Due  Topic Date Due  . Hepatitis C Screening  Never done  . TETANUS/TDAP  01/12/2020  . INFLUENZA VACCINE  05/08/2020  . PAP SMEAR-Modifier   07/16/2020    There are no preventive care reminders to display for this patient.   Lab Results  Component Value Date   TSH 2.280 10/30/2018   Lab Results  Component Value Date   WBC 6.3 12/15/2019   HGB 13.0 12/15/2019   HCT 39.3 12/15/2019   MCV 90.6 12/15/2019   PLT 335 12/15/2019   Lab Results  Component Value Date   NA 141 04/20/2020   K 4.6 04/20/2020   CO2 25 04/20/2020   GLUCOSE 103 (H) 04/20/2020   BUN 14 04/20/2020   CREATININE 0.67 04/20/2020   BILITOT 0.4 04/20/2020   ALKPHOS 96 04/20/2020   AST 19 04/20/2020   ALT 19 04/20/2020   PROT 7.0 04/20/2020   ALBUMIN 4.1 04/20/2020   CALCIUM 8.7 04/20/2020   ANIONGAP 5 12/15/2019   Lab Results  Component Value Date   CHOL 193 04/20/2020   Lab Results  Component Value Date   HDL 48 04/20/2020   Lab Results  Component Value Date   LDLCALC 127 (H) 04/20/2020   Lab Results  Component Value Date   TRIG 98 04/20/2020   No results found for: [facility] Lab Results  Component Value Date   HGBA1C 5.8 (H) 04/20/2020       Assessment & Plan:   1. Acute bacterial rhinosinusitis Symptoms and presentation consistent with acute sinusitis.  Discussed Augmentin, however she reports that this medication is not  very effective for her. She has done well with doxycyline in the past. Will start doxycycline. Script for fluconazole provided for reported yeast with antibiotic use.  Continue supportive care with tylenol, ibuprofen, and allergy medications.  Follow-up if symptoms worsen or fail to improve.  - doxycycline (VIBRA-TABS) 100 MG tablet; Take 1 tablet (100 mg total) by mouth 2 (two) times daily.  Dispense: 14 tablet; Refill: 0 - fluconazole (DIFLUCAN) 150 MG tablet; Take 1 tablet (150 mg total) by mouth daily.  Dispense: 1 tablet; Refill: 1   Orma Render, NP

## 2020-05-25 ENCOUNTER — Telehealth: Payer: Self-pay

## 2020-05-25 DIAGNOSIS — L568 Other specified acute skin changes due to ultraviolet radiation: Secondary | ICD-10-CM

## 2020-05-25 DIAGNOSIS — T50905A Adverse effect of unspecified drugs, medicaments and biological substances, initial encounter: Secondary | ICD-10-CM

## 2020-05-25 MED ORDER — LORATADINE 10 MG PO TABS: 10.0000 mg | ORAL_TABLET | Freq: Every day | ORAL | 0 refills | Status: DC

## 2020-05-25 MED ORDER — BETAMETHASONE DIPROPIONATE 0.05 % EX CREA: TOPICAL_CREAM | Freq: Two times a day (BID) | CUTANEOUS | 1 refills | Status: DC

## 2020-05-25 MED ORDER — PREDNISONE 50 MG PO TABS: 50.0000 mg | ORAL_TABLET | Freq: Every day | ORAL | 0 refills | Status: DC

## 2020-05-25 MED ORDER — HYDROXYZINE HCL 50 MG PO TABS: 50.0000 mg | ORAL_TABLET | Freq: Every evening | ORAL | 3 refills | Status: DC | PRN

## 2020-05-25 NOTE — Telephone Encounter (Signed)
Patient advised and VERY thankful.   Medications send to pharmacy in [place]. Patient will start meds and contact us if any other needs.  Thank you, Barbara Mcmahon!

## 2020-05-25 NOTE — Telephone Encounter (Signed)
Patient called stating she was started on Doxy last week by Emeterio Reeve and this week she is on vacation in [place] and after being in the sun developed a rash all over arms legs and hands. States rash is extremely itchy and did not appear until she was in the sun. Currently in [place] and wanting to know if something can be to help control the rash as well as itching and swelling.   She has a pharmacy in [place] where she is. If you can pend and RX I will get pharmacy info and send to patient's pharmacy in [place]   Please advise

## 2020-05-25 NOTE — Telephone Encounter (Signed)
It sounds like she has had an adverse skin reaction to the sun and the doxycycline- unfortunately doxycycline can make you very sensitive to the sun and this can happen.   Please have her stop the doxycycline today- she should have had 5 days worth so it will be fine to stop this.   I want her to take her hydroxyzine at bedtime- she can take the full 60mg  and may increase to 100mg  if she needs to. I will pend more in case she does not have this with her.   I am sending in loratadine 10mg  to be taken in the morning. If her insurance wont cover this, she can pick it up over the counter at the pharmacy.   I am sending in prednisone 50mg  to be taken in the morning for 5 days.   I am also sending in betamethasone cream to be used on the affected areas twice a day. Let her know not to use this on her face.   Hopefully this will help clear this up. If she goes back in the sun, she needs to wear sunscreen and protective covering on exposed skin. The doxy can last in her system for a few more days and the rash could get worse with further exposure.

## 2020-05-26 ENCOUNTER — Telehealth (INDEPENDENT_AMBULATORY_CARE_PROVIDER_SITE_OTHER): Payer: BC Managed Care – PPO | Admitting: Family Medicine

## 2020-05-26 ENCOUNTER — Encounter: Payer: Self-pay | Admitting: Family Medicine

## 2020-05-26 VITALS — Ht 60.0 in

## 2020-05-26 DIAGNOSIS — R21 Rash and other nonspecific skin eruption: Secondary | ICD-10-CM | POA: Diagnosis not present

## 2020-05-26 DIAGNOSIS — F902 Attention-deficit hyperactivity disorder, combined type: Secondary | ICD-10-CM

## 2020-05-26 MED ORDER — LISDEXAMFETAMINE DIMESYLATE 40 MG PO CAPS: 40.0000 mg | ORAL_CAPSULE | Freq: Every day | ORAL | 0 refills | Status: DC

## 2020-05-26 MED ORDER — AMPHETAMINE-DEXTROAMPHETAMINE 10 MG PO TABS: 10.0000 mg | ORAL_TABLET | Freq: Every day | ORAL | 0 refills | Status: DC

## 2020-05-26 NOTE — Progress Notes (Signed)
Virtual Visit via Video Note  I connected with Barbara Mcmahon on 05/26/20 at  8:50 AM EDT by a video enabled telemedicine application and verified that I am speaking with the correct person using two identifiers.   I discussed the limitations of evaluation and management by telemedicine and the availability of in person appointments. The patient expressed understanding and agreed to proceed.  [place] office  Subjective:    CC: 34-month follow-up for ADHD.  HPI: 39-month follow-up for ADHD.  When I last saw her in June actually increased her Vyvanse to 40 mg.  And encouraged her to follow-up in a month.  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  The bariatric physician initially mentioned starting Metformin to help curb the hunger but her psychiatrist said that it could also be the Vyvanse wearing off early and that she may be metabolizing it a little bit more quickly.  She says she still having difficulty with multitasking and getting off track she is just more aware of what is going on.  She said she just does not feel like the medication is doing quite what she was hoping that it would do.  She said even this week was really stressful and she stayed up late almost every night making lists to try to help herself get organized.  She also wanted to make a correction on her Cymbalta as she says she is actually taking 2 of the 30 mg tabs instead of to the 60 mg tabs so we have updated her medication list.   Past medical history, Surgical history, Family history not pertinant except as noted below, Social history, Allergies, and  medications have been entered into the medical record, reviewed, and corrections made.   Review of Systems: No fevers, chills, night sweats, weight loss, chest pain, or shortness of breath.   Objective:    General: Speaking clearly in complete sentences without any shortness of breath.  Alert and oriented x3.  Normal judgment. No apparent acute distress.    Impression and Recommendations:    Attention deficit hyperactivity disorder (ADHD), combined type Discussed options with Barbara Mcmahon.  We could continue with the Vyvanse 40 and add something short acting or switch the Vyvanse to Adderall XR and again add short acting Adderall in the afternoon.  It would be worth a try for 30 days to see if she feels like it is helpful or not.  We also discussed the option of completely switching to a different medication such as mental for methylphenidate.      Time spent in encounter 21 minutes  I discussed the assessment and treatment plan with the patient. The patient was provided an opportunity to ask questions and all were answered. The patient agreed with the plan and demonstrated an understanding of the instructions.   The patient was advised to call back or seek an in-person evaluation if the symptoms worsen or if the condition fails to improve as anticipated.   Beatrice Lecher, MD

## 2020-05-26 NOTE — Assessment & Plan Note (Signed)
Discussed options with Adiana.  We could continue with the Vyvanse 40 and add something short acting or switch the Vyvanse to Adderall XR and again add short acting Adderall in the afternoon.  It would be worth a try for 30 days to see if she feels like it is helpful or not.  We also discussed the option of completely switching to a different medication such as mental for methylphenidate.

## 2020-05-26 NOTE — Progress Notes (Signed)
Pt reports that she is only taking Duloxetine 30 mg BID for a total of 60 mg not 60 mg BID

## 2020-05-30 DIAGNOSIS — F411 Generalized anxiety disorder: Secondary | ICD-10-CM | POA: Diagnosis not present

## 2020-06-02 DIAGNOSIS — M609 Myositis, unspecified: Secondary | ICD-10-CM | POA: Diagnosis not present

## 2020-06-02 DIAGNOSIS — M47817 Spondylosis without myelopathy or radiculopathy, lumbosacral region: Secondary | ICD-10-CM | POA: Diagnosis not present

## 2020-06-02 DIAGNOSIS — M5137 Other intervertebral disc degeneration, lumbosacral region: Secondary | ICD-10-CM | POA: Diagnosis not present

## 2020-06-02 DIAGNOSIS — M5387 Other specified dorsopathies, lumbosacral region: Secondary | ICD-10-CM | POA: Diagnosis not present

## 2020-06-05 ENCOUNTER — Encounter: Payer: Self-pay | Admitting: Family Medicine

## 2020-06-06 MED ORDER — AMPHETAMINE-DEXTROAMPHET ER 20 MG PO CP24: 20.0000 mg | ORAL_CAPSULE | Freq: Every morning | ORAL | 0 refills | Status: DC

## 2020-06-09 ENCOUNTER — Encounter: Payer: Self-pay | Admitting: Family Medicine

## 2020-06-10 ENCOUNTER — Other Ambulatory Visit: Payer: Self-pay | Admitting: Family Medicine

## 2020-06-11 ENCOUNTER — Other Ambulatory Visit (INDEPENDENT_AMBULATORY_CARE_PROVIDER_SITE_OTHER): Payer: Self-pay | Admitting: Family Medicine

## 2020-06-11 DIAGNOSIS — R7303 Prediabetes: Secondary | ICD-10-CM

## 2020-06-13 DIAGNOSIS — F411 Generalized anxiety disorder: Secondary | ICD-10-CM | POA: Diagnosis not present

## 2020-06-14 ENCOUNTER — Ambulatory Visit (INDEPENDENT_AMBULATORY_CARE_PROVIDER_SITE_OTHER): Payer: BC Managed Care – PPO | Admitting: Family Medicine

## 2020-06-14 ENCOUNTER — Other Ambulatory Visit: Payer: Self-pay

## 2020-06-14 ENCOUNTER — Encounter (INDEPENDENT_AMBULATORY_CARE_PROVIDER_SITE_OTHER): Payer: Self-pay | Admitting: Family Medicine

## 2020-06-14 VITALS — BP 135/73 | HR 68 | Temp 97.8°F | Ht 65.0 in | Wt 294.0 lb

## 2020-06-14 DIAGNOSIS — R7303 Prediabetes: Secondary | ICD-10-CM | POA: Diagnosis not present

## 2020-06-14 DIAGNOSIS — Z9189 Other specified personal risk factors, not elsewhere classified: Secondary | ICD-10-CM

## 2020-06-14 DIAGNOSIS — Z6841 Body Mass Index (BMI) 40.0 and over, adult: Secondary | ICD-10-CM | POA: Diagnosis not present

## 2020-06-14 DIAGNOSIS — E559 Vitamin D deficiency, unspecified: Secondary | ICD-10-CM | POA: Diagnosis not present

## 2020-06-14 MED ORDER — VITAMIN D (ERGOCALCIFEROL) 1.25 MG (50000 UNIT) PO CAPS: 50000.0000 [IU] | ORAL_CAPSULE | ORAL | 0 refills | Status: DC

## 2020-06-14 MED ORDER — METFORMIN HCL 500 MG PO TABS: 500.0000 mg | ORAL_TABLET | Freq: Every day | ORAL | 0 refills | Status: DC

## 2020-06-15 NOTE — Progress Notes (Signed)
Chief Complaint:   OBESITY Barbara Mcmahon is here to discuss her progress with her obesity treatment plan along with follow-up of her obesity related diagnoses. Barbara Mcmahon is on the Category 4 Plan and states she is following her eating plan approximately 50% of the time. Barbara Mcmahon states she is doing yoga for 30 minutes 6 times per week.  Today's visit was #: 30 Starting weight: 292 lbs Starting date: 10/30/2018 Today's weight: 294 lbs Today's date: 06/14/2020 Total lbs lost to date: 0 Total lbs lost since last in-office visit: 0  Interim History: Barbara Mcmahon has done more traveling and had to eat out more. She notes her hunger is better controlled when she  Changed from Vyanse to Adderall for ADHD. She is in the process of moving and she is looking at job options but she has still tried to be mindful.  Subjective:   1. Pre-diabetes Barbara Mcmahon is stable on metformin, and she requests a refill today.  2. Vitamin D deficiency Barbara Mcmahon's Vit D level is not yet at goal. She denies nausea, vomiting, or muscle weakness.  3. At risk for heart disease Barbara Mcmahon is at a higher than average risk for cardiovascular disease due to obesity.   Assessment/Plan:   1. Pre-diabetes Barbara Mcmahon will continue to work on weight loss, exercise, and decreasing simple carbohydrates to help decrease the risk of diabetes. We will refill metformin for 1 month.  - metFORMIN (GLUCOPHAGE) 500 MG tablet; Take 1 tablet (500 mg total) by mouth daily with breakfast.  Dispense: 30 tablet; Refill: 0  2. Vitamin D deficiency Low Vitamin D level contributes to fatigue and are associated with obesity, breast, and colon cancer. We will refill prescription Vitamin D for 1 month. Barbara Mcmahon will follow-up for routine testing of Vitamin D, at least 2-3 times per year to avoid over-replacement. We will recheck labs in 4-6 weeks.  - Vitamin D, Ergocalciferol, (DRISDOL) 1.25 MG (50000 UNIT) CAPS capsule; Take 1 capsule (50,000  Units total) by mouth every 7 (seven) days.  Dispense: 4 capsule; Refill: 0  3. At risk for heart disease Barbara Mcmahon was given approximately 15 minutes of coronary artery disease prevention counseling today. She is 44 y.o. female and has risk factors for heart disease including obesity. We discussed intensive lifestyle modifications today with an emphasis on specific weight loss instructions and strategies.   Repetitive spaced learning was employed today to elicit superior memory formation and behavioral change.  4. Class 3 severe obesity with serious comorbidity and body mass index (BMI) of 45.0 to 49.9 in adult, unspecified obesity type (HCC) Barbara Mcmahon is currently in the action stage of change. As such, her goal is to continue with weight loss efforts. She has agreed to the Category 4 Plan.   Exercise goals: As is.  Behavioral modification strategies: meal planning and cooking strategies and emotional eating strategies.  Barbara Mcmahon has agreed to follow-up with our clinic in 3 weeks. She was informed of the importance of frequent follow-up visits to maximize her success with intensive lifestyle modifications for her multiple health conditions.   Objective:   Blood pressure 135/73, pulse 68, temperature 97.8 F (36.6 C), height 5\' 5"  (1.651 m), weight 294 lb (133.4 kg), SpO2 100 %. Body mass index is 48.92 kg/m.  General: Cooperative, alert, well developed, in no acute distress. HEENT: Conjunctivae and lids unremarkable. Cardiovascular: Regular rhythm.  Lungs: Normal work of breathing. Neurologic: No focal deficits.   Lab Results  Component Value Date   CREATININE 0.67 04/20/2020  BUN 14 04/20/2020   NA 141 04/20/2020   K 4.6 04/20/2020   CL 104 04/20/2020   CO2 25 04/20/2020   Lab Results  Component Value Date   ALT 19 04/20/2020   AST 19 04/20/2020   ALKPHOS 96 04/20/2020   BILITOT 0.4 04/20/2020   Lab Results  Component Value Date   HGBA1C 5.8 (H) 04/20/2020    HGBA1C 5.9 (H) 01/27/2020   HGBA1C 5.9 (H) 09/24/2019   HGBA1C 5.8 (H) 10/30/2018   Lab Results  Component Value Date   INSULIN 16.4 04/20/2020   INSULIN 67.1 (H) 01/27/2020   INSULIN 17.0 09/24/2019   INSULIN 10.3 10/30/2018   Lab Results  Component Value Date   TSH 2.280 10/30/2018   Lab Results  Component Value Date   CHOL 193 04/20/2020   HDL 48 04/20/2020   LDLCALC 127 (H) 04/20/2020   TRIG 98 04/20/2020   Lab Results  Component Value Date   WBC 6.3 12/15/2019   HGB 13.0 12/15/2019   HCT 39.3 12/15/2019   MCV 90.6 12/15/2019   PLT 335 12/15/2019   No results found for: IRON, TIBC, FERRITIN  Attestation Statements:   Reviewed by clinician on day of visit: allergies, medications, problem list, medical history, surgical history, family history, social history, and previous encounter notes.   I, Trixie Dredge, am acting as transcriptionist for Dennard Nip, MD.  I have reviewed the above documentation for accuracy and completeness, and I agree with the above. -  Dennard Nip, MD

## 2020-06-16 ENCOUNTER — Other Ambulatory Visit: Payer: Self-pay

## 2020-06-16 ENCOUNTER — Inpatient Hospital Stay: Payer: BC Managed Care – PPO | Attending: Hematology & Oncology

## 2020-06-16 ENCOUNTER — Inpatient Hospital Stay (HOSPITAL_BASED_OUTPATIENT_CLINIC_OR_DEPARTMENT_OTHER): Payer: BC Managed Care – PPO | Admitting: Hematology & Oncology

## 2020-06-16 VITALS — BP 109/73 | HR 95 | Resp 17 | Wt 296.5 lb

## 2020-06-16 DIAGNOSIS — Z86711 Personal history of pulmonary embolism: Secondary | ICD-10-CM | POA: Diagnosis not present

## 2020-06-16 DIAGNOSIS — E282 Polycystic ovarian syndrome: Secondary | ICD-10-CM

## 2020-06-16 DIAGNOSIS — I2782 Chronic pulmonary embolism: Secondary | ICD-10-CM

## 2020-06-16 DIAGNOSIS — Z7901 Long term (current) use of anticoagulants: Secondary | ICD-10-CM | POA: Diagnosis not present

## 2020-06-16 DIAGNOSIS — I2609 Other pulmonary embolism with acute cor pulmonale: Secondary | ICD-10-CM | POA: Diagnosis not present

## 2020-06-16 LAB — CBC WITH DIFFERENTIAL (CANCER CENTER ONLY)
Abs Immature Granulocytes: 0.02 K/uL (ref 0.00–0.07)
Basophils Absolute: 0.1 K/uL (ref 0.0–0.1)
Basophils Relative: 1 %
Eosinophils Absolute: 0.1 K/uL (ref 0.0–0.5)
Eosinophils Relative: 1 %
HCT: 41.4 % (ref 36.0–46.0)
Hemoglobin: 13.9 g/dL (ref 12.0–15.0)
Immature Granulocytes: 0 %
Lymphocytes Relative: 33 %
Lymphs Abs: 2.6 K/uL (ref 0.7–4.0)
MCH: 29.9 pg (ref 26.0–34.0)
MCHC: 33.6 g/dL (ref 30.0–36.0)
MCV: 89 fL (ref 80.0–100.0)
Monocytes Absolute: 0.6 K/uL (ref 0.1–1.0)
Monocytes Relative: 8 %
Neutro Abs: 4.5 K/uL (ref 1.7–7.7)
Neutrophils Relative %: 57 %
Platelet Count: 330 K/uL (ref 150–400)
RBC: 4.65 MIL/uL (ref 3.87–5.11)
RDW: 12.8 % (ref 11.5–15.5)
WBC Count: 7.8 K/uL (ref 4.0–10.5)
nRBC: 0 % (ref 0.0–0.2)

## 2020-06-16 LAB — CMP (CANCER CENTER ONLY)
ALT: 20 U/L (ref 0–44)
AST: 19 U/L (ref 15–41)
Albumin: 4.2 g/dL (ref 3.5–5.0)
Alkaline Phosphatase: 81 U/L (ref 38–126)
Anion gap: 7 (ref 5–15)
BUN: 14 mg/dL (ref 6–20)
CO2: 26 mmol/L (ref 22–32)
Calcium: 9.4 mg/dL (ref 8.9–10.3)
Chloride: 105 mmol/L (ref 98–111)
Creatinine: 0.76 mg/dL (ref 0.44–1.00)
GFR, Est AFR Am: >60 mL/min (ref >60)
GFR, Estimated: >60 mL/min (ref >60)
Glucose, Bld: 126 mg/dL — ABNORMAL HIGH (ref 70–99)
Potassium: 4.3 mmol/L (ref 3.5–5.1)
Sodium: 138 mmol/L (ref 135–145)
Total Bilirubin: 0.4 mg/dL (ref 0.3–1.2)
Total Protein: 7.3 g/dL (ref 6.5–8.1)

## 2020-06-16 NOTE — Progress Notes (Signed)
Hematology and Oncology Follow Up Visit  Barbara Mcmahon [id number] 03-30-1976 44 y.o. 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 twice daily-complete 1 year of therapy in September 2019  Eilquis 2.5 mg po BID - start 06/08/2018     Interim History:  Barbara Mcmahon is back for follow-up.  We saw her 6 months ago.  The big news is that she and her family are moving.  They will move into their new house in 2 weeks.  This is so nice.  I know that she and her family will certainly enjoyed the new house.  She is working.  She is traveling.  She was just down in [place].  She has been very diligent with avoiding the coronavirus.  She has been vaccinated.  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   She is doing well on the low-dose Eliquis.  She enjoys being on the Eliquis.  Does give her some cough and is.  Particularly, when she travels she likes being on the Eliquis.  She has had no problems with her polycystic ovaries.  She is trying to manage her weight.  I know this is been somewhat difficult because of the polycystic ovaries.  Overall, her performance status is ECOG 0.      Medications:  Current Outpatient Medications:  .  acetaminophen (TYLENOL) 500 MG tablet, Take 500 mg by mouth every 6 (six) hours as needed., Disp: , Rfl:  .  amphetamine-dextroamphetamine (ADDERALL XR) 20 MG 24 hr capsule, Take 1 capsule (20 mg total) by mouth in the morning., Disp: 30 capsule, Rfl: 0 .  amphetamine-dextroamphetamine (ADDERALL) 10 MG tablet, Take 1 tablet (10 mg total) by mouth daily after lunch., Disp: 30 tablet, Rfl: 0 .  apixaban (ELIQUIS) 5 MG TABS tablet, Take 1 tablet (5 mg total) by mouth 2 (two) times daily., Disp: 180 tablet, Rfl: 3 .  CRANBERRY PO, Take by mouth daily., Disp: , Rfl:  .   cyclobenzaprine (FLEXERIL) 10 MG tablet, Take 10 mg by mouth 3 (three) times daily as needed. , Disp: , Rfl:  .  DULoxetine (CYMBALTA) 60 MG capsule, Take by mouth. Take 2 capsules by mouth daily, Disp: , Rfl:  .  EPINEPHrine (EPI-PEN) 0.3 mg/0.3 mL SOAJ injection, Inject 0.3 mLs (0.3 mg total) into the muscle once as needed (anaphylaxis)., Disp: 1 Device, Rfl: 1 .  Melatonin 1 MG CAPS, Take 1 capsule by mouth at bedtime., Disp: , Rfl:  .  metFORMIN (GLUCOPHAGE) 500 MG tablet, Take 1 tablet (500 mg total) by mouth daily with breakfast., Disp: 30 tablet, Rfl: 0 .  Multiple Vitamin (MULTI-VITAMIN) tablet, Take by mouth., Disp: , Rfl:  .  QUEtiapine (SEROQUEL) 25 MG tablet, Take 25 mg by mouth at bedtime., Disp: , Rfl:  .  Vitamin D, Ergocalciferol, (DRISDOL) 1.25 MG (50000 UNIT) CAPS capsule, Take 1 capsule (50,000 Units total) by mouth every 7 (seven) days., Disp: 4 capsule, Rfl: 0  Allergies:  Allergies  Allergen Reactions  . Cinnamon Shortness Of Breath and Rash    Wheezing   . Morphine And Related Other (See Comments), Hives and Rash    Big mood changes BEHAVIOR CHANGES   . Nutmeg Oil (Myristica Oil) Shortness Of Breath and Rash    wheezing  . Shellfish Allergy  Hives, Swelling and Anaphylaxis    Throat closes  . Codeine Hives and Itching    Pt says she can take percocet  . Clonazepam Itching  . Robitussin A-C [Guaifenesin-Codeine] Itching  . Tramadol Itching  . Yeast-Related Products Hives    Past Medical History, Surgical history, Social history, and Family History were reviewed and updated.  Review of Systems: Review of Systems  Constitutional: Negative.   HENT:  Negative.   Eyes: Negative.   Respiratory: Negative.   Cardiovascular: Negative.   Gastrointestinal: Negative.   Endocrine: Negative.   Genitourinary: Negative.    Musculoskeletal: Negative.   Skin: Negative.   Neurological: Negative.   Hematological: Negative.   Psychiatric/Behavioral: Negative.      Physical Exam:  vitals were not taken for this visit.   Wt Readings from Last 3 Encounters:  06/14/20 294 lb (133.4 kg)  05/20/20 (!) 301 lb (136.5 kg)  05/12/20 293 lb (132.9 kg)    Physical Exam Vitals reviewed.  HENT:     Head: Normocephalic and atraumatic.  Eyes:     Pupils: Pupils are equal, round, and reactive to light.  Cardiovascular:     Rate and Rhythm: Normal rate and regular rhythm.     Heart sounds: Normal heart sounds.  Pulmonary:     Effort: Pulmonary effort is normal.     Breath sounds: Normal breath sounds.  Abdominal:     General: Bowel sounds are normal.     Palpations: Abdomen is soft.  Musculoskeletal:        General: No tenderness or deformity. Normal range of motion.     Cervical back: Normal range of motion.  Lymphadenopathy:     Cervical: No cervical adenopathy.  Skin:    General: Skin is warm and dry.     Findings: No erythema or rash.  Neurological:     Mental Status: She is alert and oriented to person, place, and time.  Psychiatric:        Behavior: Behavior normal.        Thought Content: Thought content normal.        Judgment: Judgment normal.      Lab Results  Component Value Date   WBC 7.8 06/16/2020   HGB 13.9 06/16/2020   HCT 41.4 06/16/2020   MCV 89.0 06/16/2020   PLT 330 06/16/2020     Chemistry      Component Value Date/Time   NA 141 04/20/2020 1441   NA 140 10/11/2017 1122   K 4.6 04/20/2020 1441   K 4.2 10/11/2017 1122   CL 104 04/20/2020 1441   CL 103 10/11/2017 1122   CO2 25 04/20/2020 1441   CO2 29 10/11/2017 1122   BUN 14 04/20/2020 1441   BUN 10 10/11/2017 1122   CREATININE 0.67 04/20/2020 1441   CREATININE 0.75 12/15/2019 0758   CREATININE 0.9 10/11/2017 1122      Component Value Date/Time   CALCIUM 8.7 04/20/2020 1441   CALCIUM 8.7 10/11/2017 1122   ALKPHOS 96 04/20/2020 1441   ALKPHOS 83 10/11/2017 1122   AST 19 04/20/2020 1441   AST 17 12/15/2019 0758   ALT 19 04/20/2020 1441   ALT 17  12/15/2019 0758   ALT 36 10/11/2017 1122   BILITOT 0.4 04/20/2020 1441   BILITOT 0.4 12/15/2019 0758      Impression and Plan: Barbara Mcmahon is a 44 year old white female.  She has polycystic ovaries.  She had a pulmonary embolism in September 2018.  For right  now, we will maintain her on the Eliquis.  We will continue her on the Eliquis.  She feels much more confident being on Eliquis long-term.  We will have her come back in 6 more months.  I forgot to mention that she does have a couple new tattoos.  These are very meaningful for her.  Volanda Napoleon, MD 9/9/20218:54 AM

## 2020-06-19 DIAGNOSIS — Z148 Genetic carrier of other disease: Secondary | ICD-10-CM | POA: Insufficient documentation

## 2020-06-20 DIAGNOSIS — Z148 Genetic carrier of other disease: Secondary | ICD-10-CM | POA: Diagnosis not present

## 2020-06-20 DIAGNOSIS — Z8249 Family history of ischemic heart disease and other diseases of the circulatory system: Secondary | ICD-10-CM | POA: Diagnosis not present

## 2020-06-20 DIAGNOSIS — E282 Polycystic ovarian syndrome: Secondary | ICD-10-CM | POA: Diagnosis not present

## 2020-06-20 DIAGNOSIS — Z86711 Personal history of pulmonary embolism: Secondary | ICD-10-CM | POA: Diagnosis not present

## 2020-06-20 DIAGNOSIS — Z0189 Encounter for other specified special examinations: Secondary | ICD-10-CM | POA: Diagnosis not present

## 2020-06-21 ENCOUNTER — Ambulatory Visit: Payer: BC Managed Care – PPO | Admitting: Family Medicine

## 2020-06-21 ENCOUNTER — Other Ambulatory Visit: Payer: Self-pay

## 2020-06-21 ENCOUNTER — Encounter: Payer: Self-pay | Admitting: Family Medicine

## 2020-06-21 ENCOUNTER — Ambulatory Visit: Payer: Self-pay

## 2020-06-21 VITALS — BP 156/99 | HR 91 | Ht 65.0 in | Wt 291.0 lb

## 2020-06-21 DIAGNOSIS — M533 Sacrococcygeal disorders, not elsewhere classified: Secondary | ICD-10-CM | POA: Diagnosis not present

## 2020-06-21 MED ORDER — TRIAMCINOLONE ACETONIDE 40 MG/ML IJ SUSP
40.0000 mg | Freq: Once | INTRAMUSCULAR | Status: AC
  Administered 2020-06-21: 40 mg via INTRA_ARTICULAR

## 2020-06-21 NOTE — Addendum Note (Signed)
Addended by: Sherrie George F on: 06/21/2020 04:37 PM   Modules accepted: Orders

## 2020-06-21 NOTE — Patient Instructions (Signed)
Nice to meet you Please try heat and ice  Please try the exercises   Please send me a message in MyChart with any questions or updates.  Please follow up with Dr. Darene Lamer in 4 weeks .   --Dr. Raeford Razor

## 2020-06-21 NOTE — Assessment & Plan Note (Signed)
Acute on chronic in nature.  It feels similar to previous exacerbations.  Has been going through chiropractic manipulation which does offer some improvement as well. -Counseled on home exercise therapy and supportive care. -Injection. -Could consider physical therapy or imaging.

## 2020-06-21 NOTE — Progress Notes (Signed)
Barbara Mcmahon - 44 y.o. female MRN [id number]  Date of birth: 03-23-1976  SUBJECTIVE:  Including CC & ROS.  Chief Complaint  Patient presents with  . Back Pain    right SI joint    Barbara Mcmahon is a 44 y.o. female that is presenting with right SI joint pain.  Has history of similar pain and is acutely worsening.  No history of injury or inciting event.  Does fly a lot.  No radicular pain.   Review of Systems See HPI   HISTORY: Past Medical, Surgical, Social, and Family History Reviewed & Updated per EMR.   Pertinent Historical Findings include:  Past Medical History:  Diagnosis Date  . Abnormal Pap smear and cervical HPV (human papillomavirus)   . Anxiety   . Arthritis    BAck and sacral joint  . Back pain   . Colon polyps   . Constipation   . Gallbladder problem   . Generalized anxiety disorder   . IBS (irritable bowel syndrome)   . Infertility, female   . Joint pain   . Multiple food allergies    Shellfish, cinnamon and yeast  . Obesity   . PCOS (polycystic ovarian syndrome)   . Prediabetes   . Pulmonary embolism ([place])   . Seizure disorder ([place])   . Vitamin D deficiency     Past Surgical History:  Procedure Laterality Date  . BREAST BIOPSY Right 2019   benign  . CHOLECYSTECTOMY    . COLPOSCOPY    . DILATION AND CURETTAGE OF UTERUS    . GYNECOLOGIC CRYOSURGERY    . KNEE SURGERY  right    Family History  Problem Relation Age of Onset  . Hypertension Father   . Depression Father   . Anxiety disorder Father   . Sleep apnea Father   . Obesity Father   . Diabetes Paternal Grandmother   . Hypertension Paternal Grandmother   . Stroke Paternal Grandmother   . Diabetes Paternal Grandfather   . Hypertension Paternal Grandfather   . Stroke Paternal Grandfather   . Hypertension Mother   . Depression Mother   . Anxiety disorder Mother   . Eating disorder Mother     Social History   Socioeconomic History  . Marital status: Married    Spouse name:  Zeanna Sunde  . Number of children: 2  . Years of education: Not on file  . Highest education level: Not on file  Occupational History  . Occupation: Art gallery manager  Tobacco Use  . Smoking status: Never Smoker  . Smokeless tobacco: Never Used  Vaping Use  . Vaping Use: Never used  Substance and Sexual Activity  . Alcohol use: No  . Drug use: No  . Sexual activity: Not Currently  Other Topics Concern  . Not on file  Social History Narrative  . Not on file   Social Determinants of Health   Financial Resource Strain:   . Difficulty of Paying Living Expenses: Not on file  Food Insecurity:   . Worried About Charity fundraiser in the Last Year: Not on file  . Ran Out of Food in the Last Year: Not on file  Transportation Needs:   . Lack of Transportation (Medical): Not on file  . Lack of Transportation (Non-Medical): Not on file  Physical Activity:   . Days of Exercise per Week: Not on file  . Minutes of Exercise per Session: Not on file  Stress:   . Feeling of Stress :  Not on file  Social Connections:   . Frequency of Communication with Friends and Family: Not on file  . Frequency of Social Gatherings with Friends and Family: Not on file  . Attends Religious Services: Not on file  . Active Member of Clubs or Organizations: Not on file  . Attends Archivist Meetings: Not on file  . Marital Status: Not on file  Intimate Partner Violence:   . Fear of Current or Ex-Partner: Not on file  . Emotionally Abused: Not on file  . Physically Abused: Not on file  . Sexually Abused: Not on file     PHYSICAL EXAM:  VS: BP (!) 156/99   Pulse 91   Ht 5\' 5"  (1.651 m)   Wt 291 lb (132 kg)   BMI 48.42 kg/m  Physical Exam Gen: NAD, alert, cooperative with exam, well-appearing MSK:  Right SI joint. Tenderness to palpation over the right-sided SI joint. Normal flexion and extension. Normal strength resistance with hip flexion. Neurovascularly  intact   Aspiration/Injection Procedure Note Carmine Youngberg 09/18/1976  Procedure: Injection Indications: Right SI joint pain  Procedure Details Consent: Risks of procedure as well as the alternatives and risks of each were explained to the (patient/caregiver).  Consent for procedure obtained. Time Out: Verified patient identification, verified procedure, site/side was marked, verified correct patient position, special equipment/implants available, medications/allergies/relevent history reviewed, required imaging and test results available.  Performed.  The area was cleaned with iodine and alcohol swabs.    The right SI joint was injected using 5 cc of 1% lidocaine on a 22-gauge 3-1/2 inch needle.  The syringe was switched and a mixture containing 1 cc's of 40 mg Kenalog and 4 cc's of 0.25% bupivacaine was injected.  Ultrasound was used. Images were obtained in long views showing the injection.     A sterile dressing was applied.  Patient did tolerate procedure well.     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  It feels similar to previous exacerbations.  Has been going through chiropractic manipulation which does offer some improvement as well. -Counseled on home exercise therapy and supportive care. -Injection. -Could consider physical therapy or imaging.

## 2020-06-23 DIAGNOSIS — M609 Myositis, unspecified: Secondary | ICD-10-CM | POA: Diagnosis not present

## 2020-06-23 DIAGNOSIS — M47817 Spondylosis without myelopathy or radiculopathy, lumbosacral region: Secondary | ICD-10-CM | POA: Diagnosis not present

## 2020-06-23 DIAGNOSIS — M5387 Other specified dorsopathies, lumbosacral region: Secondary | ICD-10-CM | POA: Diagnosis not present

## 2020-06-23 DIAGNOSIS — M5137 Other intervertebral disc degeneration, lumbosacral region: Secondary | ICD-10-CM | POA: Diagnosis not present

## 2020-06-27 ENCOUNTER — Telehealth: Payer: BC Managed Care – PPO | Admitting: Family Medicine

## 2020-06-27 DIAGNOSIS — F411 Generalized anxiety disorder: Secondary | ICD-10-CM | POA: Diagnosis not present

## 2020-06-27 DIAGNOSIS — Z20822 Contact with and (suspected) exposure to covid-19: Secondary | ICD-10-CM | POA: Diagnosis not present

## 2020-07-03 ENCOUNTER — Encounter: Payer: Self-pay | Admitting: Family Medicine

## 2020-07-04 MED ORDER — AMPHETAMINE-DEXTROAMPHET ER 20 MG PO CP24
20.0000 mg | ORAL_CAPSULE | Freq: Every morning | ORAL | 0 refills | Status: DC
Start: 2020-07-04 — End: 2020-08-08

## 2020-07-04 MED ORDER — DULOXETINE HCL 60 MG PO CPEP
60.0000 mg | ORAL_CAPSULE | Freq: Two times a day (BID) | ORAL | 0 refills | Status: DC
Start: 2020-07-04 — End: 2021-04-19

## 2020-07-04 NOTE — Telephone Encounter (Signed)
Rx pended

## 2020-07-05 ENCOUNTER — Ambulatory Visit (INDEPENDENT_AMBULATORY_CARE_PROVIDER_SITE_OTHER): Payer: BC Managed Care – PPO | Admitting: Family Medicine

## 2020-07-06 ENCOUNTER — Telehealth: Payer: Self-pay | Admitting: Family Medicine

## 2020-07-06 ENCOUNTER — Other Ambulatory Visit: Payer: Self-pay

## 2020-07-06 ENCOUNTER — Encounter (INDEPENDENT_AMBULATORY_CARE_PROVIDER_SITE_OTHER): Payer: Self-pay | Admitting: Family Medicine

## 2020-07-06 ENCOUNTER — Ambulatory Visit (INDEPENDENT_AMBULATORY_CARE_PROVIDER_SITE_OTHER): Payer: BC Managed Care – PPO | Admitting: Family Medicine

## 2020-07-06 VITALS — BP 135/84 | HR 87 | Temp 97.9°F | Ht 65.0 in | Wt 294.0 lb

## 2020-07-06 DIAGNOSIS — Z9189 Other specified personal risk factors, not elsewhere classified: Secondary | ICD-10-CM | POA: Diagnosis not present

## 2020-07-06 DIAGNOSIS — R7303 Prediabetes: Secondary | ICD-10-CM

## 2020-07-06 DIAGNOSIS — Z6841 Body Mass Index (BMI) 40.0 and over, adult: Secondary | ICD-10-CM

## 2020-07-06 DIAGNOSIS — E559 Vitamin D deficiency, unspecified: Secondary | ICD-10-CM

## 2020-07-06 MED ORDER — VITAMIN D (ERGOCALCIFEROL) 1.25 MG (50000 UNIT) PO CAPS: 50000.0000 [IU] | ORAL_CAPSULE | ORAL | 0 refills | Status: DC

## 2020-07-06 NOTE — Telephone Encounter (Signed)
Received fax for PA on Amphetamine Dextroamphet sent through cover my meds and received authorization.   PA Case: [id number] Valid: 07/06/20 - 07/06/21  I am faxing pharmacy so they will fill medication for patient . - CF

## 2020-07-07 DIAGNOSIS — M5137 Other intervertebral disc degeneration, lumbosacral region: Secondary | ICD-10-CM | POA: Diagnosis not present

## 2020-07-07 DIAGNOSIS — M47817 Spondylosis without myelopathy or radiculopathy, lumbosacral region: Secondary | ICD-10-CM | POA: Diagnosis not present

## 2020-07-07 DIAGNOSIS — M609 Myositis, unspecified: Secondary | ICD-10-CM | POA: Diagnosis not present

## 2020-07-07 DIAGNOSIS — M5387 Other specified dorsopathies, lumbosacral region: Secondary | ICD-10-CM | POA: Diagnosis not present

## 2020-07-07 NOTE — Progress Notes (Signed)
Chief Complaint:   OBESITY Barbara Mcmahon is here to discuss her progress with her obesity treatment plan along with follow-up of her obesity related diagnoses. Barbara Mcmahon is on the Category 4 Plan and states she is following her eating plan approximately 75% of the time. Barbara Mcmahon states she is active while moving into a new home all day 7 times per week.  Today's visit was #: 15 Starting weight: 292 lbs Starting date: 10/30/2018 Today's weight: 294 lbs Today's date: 07/06/2020 Total lbs lost to date: 0 Total lbs lost since last in-office visit: 0  Interim History: Barbara Mcmahon has done well with maintaining her weight even while moving into her new home. She is getting back into her new routine and will likely be able to get back to weight loss.  Subjective:   1. Pre-diabetes Barbara Mcmahon has been working with diet and exercise. She has been stable on metformin but is holiday this per her primary care physician while she is adjusting her medications.  2. Vitamin D deficiency Barbara Mcmahon is stable on Vit D, and she denies nausea or vomiting.  3. At risk for diabetes mellitus Barbara Mcmahon is at higher than average risk for developing diabetes due to her obesity.   Assessment/Plan:   1. Pre-diabetes Barbara Mcmahon will continue to work on weight loss, diet, exercise, and decreasing simple carbohydrates to help decrease the risk of diabetes. Barbara Mcmahon will restart metformin  2. Vitamin D deficiency Low Vitamin D level contributes to fatigue and are associated with obesity, breast, and colon cancer. We will refill prescription Vitamin D for 1 month. Barbara Mcmahon will follow-up for routine testing of Vitamin D, at least 2-3 times per year to avoid over-replacement.  - Vitamin D, Ergocalciferol, (DRISDOL) 1.25 MG (50000 UNIT) CAPS capsule; Take 1 capsule (50,000 Units total) by mouth every 7 (seven) days.  Dispense: 4 capsule; Refill: 0  3. At risk for diabetes mellitus Barbara Mcmahon was given approximately  15 minutes of diabetes education and counseling today. We discussed intensive lifestyle modifications today with an emphasis on weight loss as well as increasing exercise and decreasing simple carbohydrates in her diet. We also reviewed medication options with an emphasis on risk versus benefit of those discussed.   Repetitive spaced learning was employed today to elicit superior memory formation and behavioral change.  4. Class 3 severe obesity with serious comorbidity and body mass index (BMI) of 45.0 to 49.9 in adult, unspecified obesity type (HCC) Barbara Mcmahon is currently in the action stage of change. As such, her goal is to continue with weight loss efforts. She has agreed to the Category 4 Plan.   Behavioral modification strategies: increasing lean protein intake and decreasing simple carbohydrates.  Barbara Mcmahon has agreed to follow-up with our clinic in 3 weeks. She was informed of the importance of frequent follow-up visits to maximize her success with intensive lifestyle modifications for her multiple health conditions.   Objective:   Blood pressure 135/84, pulse 87, temperature 97.9 F (36.6 C), height 5\' 5"  (1.651 m), weight 294 lb (133.4 kg), SpO2 96 %. Body mass index is 48.92 kg/m.  General: Cooperative, alert, well developed, in no acute distress. HEENT: Conjunctivae and lids unremarkable. Cardiovascular: Regular rhythm.  Lungs: Normal work of breathing. Neurologic: No focal deficits.   Lab Results  Component Value Date   CREATININE 0.76 06/16/2020   BUN 14 06/16/2020   NA 138 06/16/2020   K 4.3 06/16/2020   CL 105 06/16/2020   CO2 26 06/16/2020   Lab Results  Component  Value Date   ALT 20 06/16/2020   AST 19 06/16/2020   ALKPHOS 81 06/16/2020   BILITOT 0.4 06/16/2020   Lab Results  Component Value Date   HGBA1C 5.8 (H) 04/20/2020   HGBA1C 5.9 (H) 01/27/2020   HGBA1C 5.9 (H) 09/24/2019   HGBA1C 5.8 (H) 10/30/2018   Lab Results  Component Value Date    INSULIN 16.4 04/20/2020   INSULIN 67.1 (H) 01/27/2020   INSULIN 17.0 09/24/2019   INSULIN 10.3 10/30/2018   Lab Results  Component Value Date   TSH 2.280 10/30/2018   Lab Results  Component Value Date   CHOL 193 04/20/2020   HDL 48 04/20/2020   LDLCALC 127 (H) 04/20/2020   TRIG 98 04/20/2020   Lab Results  Component Value Date   WBC 7.8 06/16/2020   HGB 13.9 06/16/2020   HCT 41.4 06/16/2020   MCV 89.0 06/16/2020   PLT 330 06/16/2020   No results found for: IRON, TIBC, FERRITIN  Attestation Statements:   Reviewed by clinician on day of visit: allergies, medications, problem list, medical history, surgical history, family history, social history, and previous encounter notes.   I, Trixie Dredge, am acting as transcriptionist for Dennard Nip, MD.  I have reviewed the above documentation for accuracy and completeness, and I agree with the above. -  Dennard Nip, MD

## 2020-07-08 ENCOUNTER — Ambulatory Visit
Admission: RE | Admit: 2020-07-08 | Discharge: 2020-07-08 | Disposition: A | Discharge status: Home / Self Care | Payer: BC Managed Care – PPO | Source: Ambulatory Visit | Attending: Obstetrics and Gynecology | Admitting: Obstetrics and Gynecology

## 2020-07-08 ENCOUNTER — Other Ambulatory Visit: Payer: Self-pay | Admitting: Obstetrics and Gynecology

## 2020-07-08 ENCOUNTER — Other Ambulatory Visit: Payer: Self-pay

## 2020-07-08 DIAGNOSIS — R928 Other abnormal and inconclusive findings on diagnostic imaging of breast: Secondary | ICD-10-CM

## 2020-07-08 DIAGNOSIS — N6011 Diffuse cystic mastopathy of right breast: Secondary | ICD-10-CM | POA: Diagnosis not present

## 2020-07-11 DIAGNOSIS — F411 Generalized anxiety disorder: Secondary | ICD-10-CM | POA: Diagnosis not present

## 2020-07-13 DIAGNOSIS — L821 Other seborrheic keratosis: Secondary | ICD-10-CM | POA: Diagnosis not present

## 2020-07-13 DIAGNOSIS — D225 Melanocytic nevi of trunk: Secondary | ICD-10-CM | POA: Diagnosis not present

## 2020-07-13 DIAGNOSIS — Z808 Family history of malignant neoplasm of other organs or systems: Secondary | ICD-10-CM | POA: Diagnosis not present

## 2020-07-15 ENCOUNTER — Other Ambulatory Visit (INDEPENDENT_AMBULATORY_CARE_PROVIDER_SITE_OTHER): Payer: Self-pay | Admitting: Family Medicine

## 2020-07-15 DIAGNOSIS — R7303 Prediabetes: Secondary | ICD-10-CM

## 2020-07-18 NOTE — Telephone Encounter (Signed)
Mychart message sent to patient.

## 2020-07-20 DIAGNOSIS — M47817 Spondylosis without myelopathy or radiculopathy, lumbosacral region: Secondary | ICD-10-CM | POA: Diagnosis not present

## 2020-07-20 DIAGNOSIS — M609 Myositis, unspecified: Secondary | ICD-10-CM | POA: Diagnosis not present

## 2020-07-20 DIAGNOSIS — M5137 Other intervertebral disc degeneration, lumbosacral region: Secondary | ICD-10-CM | POA: Diagnosis not present

## 2020-07-20 DIAGNOSIS — M5387 Other specified dorsopathies, lumbosacral region: Secondary | ICD-10-CM | POA: Diagnosis not present

## 2020-07-21 ENCOUNTER — Ambulatory Visit (INDEPENDENT_AMBULATORY_CARE_PROVIDER_SITE_OTHER): Payer: BC Managed Care – PPO | Admitting: Family Medicine

## 2020-07-21 ENCOUNTER — Other Ambulatory Visit: Payer: Self-pay

## 2020-07-21 ENCOUNTER — Encounter: Payer: Self-pay | Admitting: Family Medicine

## 2020-07-21 DIAGNOSIS — M533 Sacrococcygeal disorders, not elsewhere classified: Secondary | ICD-10-CM | POA: Diagnosis not present

## 2020-07-21 NOTE — Assessment & Plan Note (Signed)
Improvement with the injection.  -Counseled on home exercise therapy and supportive care. -Could consider physical therapy or facet injections if pain is ongoing.

## 2020-07-21 NOTE — Progress Notes (Signed)
Barbara Mcmahon - 44 y.o. female MRN [id number]  Date of birth: 1976-09-10  SUBJECTIVE:  Including CC & ROS.  Chief Complaint  Patient presents with  . Follow-up    right SI Joint    Barbara Mcmahon is a 44 y.o. female that is following up for her SI joint pain.  She has been doing well since the injection.  She still has mild pain.  Is able to do her normal activities.  Denies any radicular type pain..  Review of the MRI lumbar spine from 2018 shows moderately severe bilateral facet arthritis at L4-5 and severe at L5-S1.   Review of Systems See HPI   HISTORY: Past Medical, Surgical, Social, and Family History Reviewed & Updated per EMR.   Pertinent Historical Findings include:  Past Medical History:  Diagnosis Date  . Abnormal Pap smear and cervical HPV (human papillomavirus)   . Anxiety   . Arthritis    BAck and sacral joint  . Back pain   . Colon polyps   . Constipation   . Gallbladder problem   . Generalized anxiety disorder   . IBS (irritable bowel syndrome)   . Infertility, female   . Joint pain   . Multiple food allergies    Shellfish, cinnamon and yeast  . Obesity   . PCOS (polycystic ovarian syndrome)   . Prediabetes   . Pulmonary embolism ([place])   . Seizure disorder ([place])   . Vitamin D deficiency     Past Surgical History:  Procedure Laterality Date  . BREAST BIOPSY Right 2019   benign  . CHOLECYSTECTOMY    . COLPOSCOPY    . DILATION AND CURETTAGE OF UTERUS    . GYNECOLOGIC CRYOSURGERY    . KNEE SURGERY  right    Family History  Problem Relation Age of Onset  . Hypertension Father   . Depression Father   . Anxiety disorder Father   . Sleep apnea Father   . Obesity Father   . Diabetes Paternal Grandmother   . Hypertension Paternal Grandmother   . Stroke Paternal Grandmother   . Diabetes Paternal Grandfather   . Hypertension Paternal Grandfather   . Stroke Paternal Grandfather   . Hypertension Mother   . Depression Mother   . Anxiety  disorder Mother   . Eating disorder Mother     Social History   Socioeconomic History  . Marital status: Married    Spouse name: Keyleen Cerrato  . Number of children: 2  . Years of education: Not on file  . Highest education level: Not on file  Occupational History  . Occupation: Art gallery manager  Tobacco Use  . Smoking status: Never Smoker  . Smokeless tobacco: Never Used  Vaping Use  . Vaping Use: Never used  Substance and Sexual Activity  . Alcohol use: No  . Drug use: No  . Sexual activity: Not Currently  Other Topics Concern  . Not on file  Social History Narrative  . Not on file   Social Determinants of Health   Financial Resource Strain:   . Difficulty of Paying Living Expenses: Not on file  Food Insecurity:   . Worried About Charity fundraiser in the Last Year: Not on file  . Ran Out of Food in the Last Year: Not on file  Transportation Needs:   . Lack of Transportation (Medical): Not on file  . Lack of Transportation (Non-Medical): Not on file  Physical Activity:   . Days of Exercise  per Week: Not on file  . Minutes of Exercise per Session: Not on file  Stress:   . Feeling of Stress : Not on file  Social Connections:   . Frequency of Communication with Friends and Family: Not on file  . Frequency of Social Gatherings with Friends and Family: Not on file  . Attends Religious Services: Not on file  . Active Member of Clubs or Organizations: Not on file  . Attends Archivist Meetings: Not on file  . Marital Status: Not on file  Intimate Partner Violence:   . Fear of Current or Ex-Partner: Not on file  . Emotionally Abused: Not on file  . Physically Abused: Not on file  . Sexually Abused: Not on file     PHYSICAL EXAM:  VS: BP (!) 129/92   Pulse 97   Ht 5\' 5"  (1.651 m)   Wt 289 lb (131.1 kg)   BMI 48.09 kg/m  Physical Exam Gen: NAD, alert, cooperative with exam, well-appearing   ASSESSMENT & PLAN:   SI  (sacroiliac) joint dysfunction Improvement with the injection.  -Counseled on home exercise therapy and supportive care. -Could consider physical therapy or facet injections if pain is ongoing.

## 2020-07-25 DIAGNOSIS — F411 Generalized anxiety disorder: Secondary | ICD-10-CM | POA: Diagnosis not present

## 2020-07-27 ENCOUNTER — Ambulatory Visit (INDEPENDENT_AMBULATORY_CARE_PROVIDER_SITE_OTHER): Payer: BC Managed Care – PPO | Admitting: Family Medicine

## 2020-07-27 ENCOUNTER — Other Ambulatory Visit: Payer: Self-pay

## 2020-07-27 ENCOUNTER — Encounter (INDEPENDENT_AMBULATORY_CARE_PROVIDER_SITE_OTHER): Payer: Self-pay | Admitting: Family Medicine

## 2020-07-27 VITALS — BP 135/76 | HR 82 | Temp 98.1°F | Ht 65.0 in | Wt 295.0 lb

## 2020-07-27 DIAGNOSIS — R7303 Prediabetes: Secondary | ICD-10-CM | POA: Diagnosis not present

## 2020-07-27 DIAGNOSIS — Z6841 Body Mass Index (BMI) 40.0 and over, adult: Secondary | ICD-10-CM

## 2020-08-02 NOTE — Progress Notes (Signed)
Chief Complaint:   OBESITY Barbara Mcmahon is here to discuss her progress with her obesity treatment plan along with follow-up of her obesity related diagnoses. Barbara Mcmahon is on the Category 4 Plan and states she is following her eating plan approximately 65% of the time. Barbara Mcmahon states she has been active while moving(moved home recently).  Today's visit was #: 11 Starting weight: 292 lbs Starting date: 10/30/2018 Today's weight: 295 lbs Today's date: 07/27/2020 Total lbs lost to date: 0 Total lbs lost since last in-office visit: 0  Interim History: Barbara Mcmahon has struggled to be strict with her diet prescription, but she has been mindful and has tried to [employer]. She thinks she will have insurance coverage for weight loss surgery soon, and she would like to discuss this option.  Subjective:   1. Pre-diabetes Barbara Mcmahon is stable on metformin, and she denies nausea, vomiting, or hypoglycemia. She is working on eating healthier and decreasing simple carbohydrates.  Assessment/Plan:   1. Pre-diabetes Barbara Mcmahon will continue to work on weight loss, diet, exercise, and decreasing simple carbohydrates to help decrease the risk of diabetes. We will continue to monitor.  2. Class 3 severe obesity with serious comorbidity and body mass index (BMI) of 45.0 to 49.9 in adult, unspecified obesity type (HCC) Barbara Mcmahon is currently in the action stage of change. As such, her goal is to continue with weight loss efforts. She has agreed to the Category 4 Plan.   Weight loss surgery is an options for the patient to consider once she has insurance coverage. Some options were discussed but will discuss further when coverage is verified.  Behavioral modification strategies: no skipping meals.  Barbara Mcmahon has agreed to follow-up with our clinic in 3 to 4 weeks. She was informed of the importance of frequent follow-up visits to maximize her success with intensive lifestyle modifications for  her multiple health conditions.   Objective:   Blood pressure 135/76, pulse 82, temperature 98.1 F (36.7 C), height 5\' 5"  (1.651 m), weight 295 lb (133.8 kg), SpO2 100 %. Body mass index is 49.09 kg/m.  General: Cooperative, alert, well developed, in no acute distress. HEENT: Conjunctivae and lids unremarkable. Cardiovascular: Regular rhythm.  Lungs: Normal work of breathing. Neurologic: No focal deficits.   Lab Results  Component Value Date   CREATININE 0.76 06/16/2020   BUN 14 06/16/2020   NA 138 06/16/2020   K 4.3 06/16/2020   CL 105 06/16/2020   CO2 26 06/16/2020   Lab Results  Component Value Date   ALT 20 06/16/2020   AST 19 06/16/2020   ALKPHOS 81 06/16/2020   BILITOT 0.4 06/16/2020   Lab Results  Component Value Date   HGBA1C 5.8 (H) 04/20/2020   HGBA1C 5.9 (H) 01/27/2020   HGBA1C 5.9 (H) 09/24/2019   HGBA1C 5.8 (H) 10/30/2018   Lab Results  Component Value Date   INSULIN 16.4 04/20/2020   INSULIN 67.1 (H) 01/27/2020   INSULIN 17.0 09/24/2019   INSULIN 10.3 10/30/2018   Lab Results  Component Value Date   TSH 2.280 10/30/2018   Lab Results  Component Value Date   CHOL 193 04/20/2020   HDL 48 04/20/2020   LDLCALC 127 (H) 04/20/2020   TRIG 98 04/20/2020   Lab Results  Component Value Date   WBC 7.8 06/16/2020   HGB 13.9 06/16/2020   HCT 41.4 06/16/2020   MCV 89.0 06/16/2020   PLT 330 06/16/2020   No results found for: IRON, TIBC, FERRITIN  Attestation Statements:  Reviewed by clinician on day of visit: allergies, medications, problem list, medical history, surgical history, family history, social history, and previous encounter notes.  Time spent on visit including pre-visit chart review and post-visit care and charting was 32 minutes.    I, Trixie Dredge, am acting as transcriptionist for Dennard Nip, MD.  I have reviewed the above documentation for accuracy and completeness, and I agree with the above. -  Dennard Nip, MD

## 2020-08-04 DIAGNOSIS — M47817 Spondylosis without myelopathy or radiculopathy, lumbosacral region: Secondary | ICD-10-CM | POA: Diagnosis not present

## 2020-08-04 DIAGNOSIS — M609 Myositis, unspecified: Secondary | ICD-10-CM | POA: Diagnosis not present

## 2020-08-04 DIAGNOSIS — M5137 Other intervertebral disc degeneration, lumbosacral region: Secondary | ICD-10-CM | POA: Diagnosis not present

## 2020-08-04 DIAGNOSIS — M5387 Other specified dorsopathies, lumbosacral region: Secondary | ICD-10-CM | POA: Diagnosis not present

## 2020-08-07 ENCOUNTER — Encounter: Payer: Self-pay | Admitting: Family Medicine

## 2020-08-08 DIAGNOSIS — F411 Generalized anxiety disorder: Secondary | ICD-10-CM | POA: Diagnosis not present

## 2020-08-08 MED ORDER — AMPHETAMINE-DEXTROAMPHETAMINE 10 MG PO TABS: 10.0000 mg | ORAL_TABLET | Freq: Every day | ORAL | 0 refills | Status: DC

## 2020-08-08 MED ORDER — AMPHETAMINE-DEXTROAMPHET ER 20 MG PO CP24
20.0000 mg | ORAL_CAPSULE | Freq: Every morning | ORAL | 0 refills | Status: DC
Start: 2020-08-08 — End: 2020-09-13

## 2020-08-08 NOTE — Telephone Encounter (Signed)
meds sent. Have her schedule in person f/u in 1 mo so we can check BP on meds, etc

## 2020-08-15 ENCOUNTER — Telehealth: Payer: Self-pay | Admitting: *Deleted

## 2020-08-15 NOTE — Telephone Encounter (Signed)
Authorization for immediate release Adderall approved [id number]/8/2. Approval #[id number]. Pharmacy notified.

## 2020-08-18 DIAGNOSIS — M5387 Other specified dorsopathies, lumbosacral region: Secondary | ICD-10-CM | POA: Diagnosis not present

## 2020-08-18 DIAGNOSIS — M609 Myositis, unspecified: Secondary | ICD-10-CM | POA: Diagnosis not present

## 2020-08-18 DIAGNOSIS — M47817 Spondylosis without myelopathy or radiculopathy, lumbosacral region: Secondary | ICD-10-CM | POA: Diagnosis not present

## 2020-08-18 DIAGNOSIS — M5137 Other intervertebral disc degeneration, lumbosacral region: Secondary | ICD-10-CM | POA: Diagnosis not present

## 2020-08-22 DIAGNOSIS — F411 Generalized anxiety disorder: Secondary | ICD-10-CM | POA: Diagnosis not present

## 2020-08-23 ENCOUNTER — Ambulatory Visit (INDEPENDENT_AMBULATORY_CARE_PROVIDER_SITE_OTHER): Payer: BC Managed Care – PPO | Admitting: Family Medicine

## 2020-08-29 ENCOUNTER — Ambulatory Visit (INDEPENDENT_AMBULATORY_CARE_PROVIDER_SITE_OTHER): Payer: BC Managed Care – PPO | Admitting: Family Medicine

## 2020-08-29 ENCOUNTER — Encounter (INDEPENDENT_AMBULATORY_CARE_PROVIDER_SITE_OTHER): Payer: Self-pay | Admitting: Family Medicine

## 2020-08-29 ENCOUNTER — Other Ambulatory Visit: Payer: Self-pay

## 2020-08-29 VITALS — BP 139/79 | HR 90 | Temp 97.7°F | Ht 65.0 in | Wt 296.0 lb

## 2020-08-29 DIAGNOSIS — Z6841 Body Mass Index (BMI) 40.0 and over, adult: Secondary | ICD-10-CM | POA: Diagnosis not present

## 2020-08-29 DIAGNOSIS — Z9189 Other specified personal risk factors, not elsewhere classified: Secondary | ICD-10-CM | POA: Diagnosis not present

## 2020-08-29 DIAGNOSIS — F439 Reaction to severe stress, unspecified: Secondary | ICD-10-CM

## 2020-08-29 DIAGNOSIS — E559 Vitamin D deficiency, unspecified: Secondary | ICD-10-CM

## 2020-08-29 MED ORDER — VITAMIN D (ERGOCALCIFEROL) 1.25 MG (50000 UNIT) PO CAPS: 50000.0000 [IU] | ORAL_CAPSULE | ORAL | 0 refills | Status: DC

## 2020-08-30 NOTE — Progress Notes (Signed)
Chief Complaint:   OBESITY Barbara Mcmahon is here to discuss her progress with her obesity treatment plan along with follow-up of her obesity related diagnoses. Barbara Mcmahon is on the Category 4 Plan and states she is following her eating plan approximately 70% of the time. Barbara Mcmahon states she is unpacking and moving 7 times per week.  Today's visit was #: 60 Starting weight: 292 lbs Starting date: 10/30/2018 Today's weight: 296 lbs Today's date: 08/29/2020 Total lbs lost to date: 0 Total lbs lost since last in-office visit: 0  Interim History: Barbara Mcmahon is going through a stressful time at work. She hasn't been able to eat all of the food on the plan. She is making plans for Thanksgiving and she plans on using some good damage control strategies.  Subjective:   1. Vitamin D deficiency Barbara Mcmahon's Vit D level is not yet at goal. She is doing well remembering to take it weekly.  2. Stress Barbara Mcmahon has been dealing with increased stress at work. She has done well avoiding emotional eating, and she is using stress reduction strategies.  3. At risk for impaired metabolic function Barbara Mcmahon is at increased risk for impaired metabolic function due to current nutrition and muscle mass.  Assessment/Plan:   1. Vitamin D deficiency Low Vitamin D level contributes to fatigue and are associated with obesity, breast, and colon cancer. We will refill prescription Vitamin D for 1 month. Barbara Mcmahon will follow-up for routine testing of Vitamin D, at least 2-3 times per year to avoid over-replacement.  - Vitamin D, Ergocalciferol, (DRISDOL) 1.25 MG (50000 UNIT) CAPS capsule; Take 1 capsule (50,000 Units total) by mouth every 7 (seven) days.  Dispense: 4 capsule; Refill: 0  2. Stress Emotional eating strategies were discussed in depth, and Barbara Mcmahon was offered support and encouragement.   3. At risk for impaired metabolic function Barbara Mcmahon was given approximately 15 minutes of impaired   metabolic function prevention counseling today. We discussed intensive lifestyle modifications today with an emphasis on specific nutrition and exercise instructions and strategies.   Repetitive spaced learning was employed today to elicit superior memory formation and behavioral change.  4. Class 3 severe obesity with serious comorbidity and body mass index (BMI) of 45.0 to 49.9 in adult, unspecified obesity type (HCC) Barbara Mcmahon is currently in the action stage of change. As such, her goal is to continue with weight loss efforts. She has agreed to the Category 4 Plan.   Exercise goals: As is.  Behavioral modification strategies: meal planning and cooking strategies and holiday eating strategies .  Barbara Mcmahon has agreed to follow-up with our clinic in 3 weeks. She was informed of the importance of frequent follow-up visits to maximize her success with intensive lifestyle modifications for her multiple health conditions.   Objective:   Blood pressure 139/79, pulse 90, temperature 97.7 F (36.5 C), height 5\' 5"  (1.651 m), weight 296 lb (134.3 kg), SpO2 96 %. Body mass index is 49.26 kg/m.  General: Cooperative, alert, well developed, in no acute distress. HEENT: Conjunctivae and lids unremarkable. Cardiovascular: Regular rhythm.  Lungs: Normal work of breathing. Neurologic: No focal deficits.   Lab Results  Component Value Date   CREATININE 0.76 06/16/2020   BUN 14 06/16/2020   NA 138 06/16/2020   K 4.3 06/16/2020   CL 105 06/16/2020   CO2 26 06/16/2020   Lab Results  Component Value Date   ALT 20 06/16/2020   AST 19 06/16/2020   ALKPHOS 81 06/16/2020   BILITOT 0.4 06/16/2020  Lab Results  Component Value Date   HGBA1C 5.8 (H) 04/20/2020   HGBA1C 5.9 (H) 01/27/2020   HGBA1C 5.9 (H) 09/24/2019   HGBA1C 5.8 (H) 10/30/2018   Lab Results  Component Value Date   INSULIN 16.4 04/20/2020   INSULIN 67.1 (H) 01/27/2020   INSULIN 17.0 09/24/2019   INSULIN 10.3 10/30/2018    Lab Results  Component Value Date   TSH 2.280 10/30/2018   Lab Results  Component Value Date   CHOL 193 04/20/2020   HDL 48 04/20/2020   LDLCALC 127 (H) 04/20/2020   TRIG 98 04/20/2020   Lab Results  Component Value Date   WBC 7.8 06/16/2020   HGB 13.9 06/16/2020   HCT 41.4 06/16/2020   MCV 89.0 06/16/2020   PLT 330 06/16/2020   No results found for: IRON, TIBC, FERRITIN  Attestation Statements:   Reviewed by clinician on day of visit: allergies, medications, problem list, medical history, surgical history, family history, social history, and previous encounter notes.   I, Trixie Dredge, am acting as transcriptionist for Dennard Nip, MD.  I have reviewed the above documentation for accuracy and completeness, and I agree with the above. -  Dennard Nip, MD

## 2020-08-31 DIAGNOSIS — M5137 Other intervertebral disc degeneration, lumbosacral region: Secondary | ICD-10-CM | POA: Diagnosis not present

## 2020-08-31 DIAGNOSIS — M5387 Other specified dorsopathies, lumbosacral region: Secondary | ICD-10-CM | POA: Diagnosis not present

## 2020-08-31 DIAGNOSIS — M47817 Spondylosis without myelopathy or radiculopathy, lumbosacral region: Secondary | ICD-10-CM | POA: Diagnosis not present

## 2020-08-31 DIAGNOSIS — M609 Myositis, unspecified: Secondary | ICD-10-CM | POA: Diagnosis not present

## 2020-09-05 DIAGNOSIS — F411 Generalized anxiety disorder: Secondary | ICD-10-CM | POA: Diagnosis not present

## 2020-09-13 ENCOUNTER — Encounter: Payer: Self-pay | Admitting: Family Medicine

## 2020-09-13 ENCOUNTER — Other Ambulatory Visit: Payer: Self-pay | Admitting: *Deleted

## 2020-09-13 MED ORDER — AMPHETAMINE-DEXTROAMPHET ER 20 MG PO CP24
20.0000 mg | ORAL_CAPSULE | Freq: Every morning | ORAL | 0 refills | Status: DC
Start: 2020-09-13 — End: 2020-09-26

## 2020-09-13 MED ORDER — AMPHETAMINE-DEXTROAMPHETAMINE 10 MG PO TABS: 10.0000 mg | ORAL_TABLET | Freq: Every day | ORAL | 0 refills | Status: DC

## 2020-09-13 NOTE — Progress Notes (Signed)
meds sent

## 2020-09-14 DIAGNOSIS — M5137 Other intervertebral disc degeneration, lumbosacral region: Secondary | ICD-10-CM | POA: Diagnosis not present

## 2020-09-14 DIAGNOSIS — M5387 Other specified dorsopathies, lumbosacral region: Secondary | ICD-10-CM | POA: Diagnosis not present

## 2020-09-14 DIAGNOSIS — M609 Myositis, unspecified: Secondary | ICD-10-CM | POA: Diagnosis not present

## 2020-09-14 DIAGNOSIS — M47817 Spondylosis without myelopathy or radiculopathy, lumbosacral region: Secondary | ICD-10-CM | POA: Diagnosis not present

## 2020-09-19 ENCOUNTER — Ambulatory Visit (INDEPENDENT_AMBULATORY_CARE_PROVIDER_SITE_OTHER): Payer: BC Managed Care – PPO | Admitting: Family Medicine

## 2020-09-19 ENCOUNTER — Other Ambulatory Visit: Payer: Self-pay

## 2020-09-19 ENCOUNTER — Encounter (INDEPENDENT_AMBULATORY_CARE_PROVIDER_SITE_OTHER): Payer: Self-pay | Admitting: Family Medicine

## 2020-09-19 VITALS — BP 145/75 | HR 87 | Temp 97.7°F | Ht 65.0 in | Wt 297.0 lb

## 2020-09-19 DIAGNOSIS — R7303 Prediabetes: Secondary | ICD-10-CM

## 2020-09-19 DIAGNOSIS — R030 Elevated blood-pressure reading, without diagnosis of hypertension: Secondary | ICD-10-CM

## 2020-09-19 DIAGNOSIS — E559 Vitamin D deficiency, unspecified: Secondary | ICD-10-CM | POA: Diagnosis not present

## 2020-09-19 DIAGNOSIS — Z6841 Body Mass Index (BMI) 40.0 and over, adult: Secondary | ICD-10-CM

## 2020-09-19 MED ORDER — METFORMIN HCL 500 MG PO TABS: 500.0000 mg | ORAL_TABLET | Freq: Every day | ORAL | 0 refills | Status: DC

## 2020-09-19 MED ORDER — VITAMIN D (ERGOCALCIFEROL) 1.25 MG (50000 UNIT) PO CAPS: 50000.0000 [IU] | ORAL_CAPSULE | ORAL | 0 refills | Status: DC

## 2020-09-19 NOTE — Progress Notes (Signed)
Chief Complaint:   OBESITY Barbara Mcmahon is here to discuss her progress with her obesity treatment plan along with follow-up of her obesity related diagnoses. Barbara Mcmahon is on the Category 4 Plan and states she is following her eating plan approximately 75% of the time. Barbara Mcmahon states she is doing 0 minutes 0 times per week.  Today's visit was #: 78 Starting weight: 292 lbs Starting date: 10/30/2018 Today's weight: 297 lbs Today's date: 09/19/2020 Total lbs lost to date: 0 Total lbs lost since last in-office visit: 0  Interim History: Barbara Mcmahon has done well with portion control and making smarter choices, and mostly maintaining her weight over Thanksgiving. She is interested in having weight loss surgery, and would like more information on various weight loss surgeries and a referral.  Subjective:   1. Pre-diabetes Barbara Mcmahon is stable on metformin, and she is working on diet. She requests a refill today.  2. Vitamin D deficiency Barbara Mcmahon is stable on Vit D, and she is due for labs soon.  3. Elevated blood pressure reading Barbara Mcmahon's blood pressure is elevated today, normally better controlled.  Assessment/Plan:   1. Pre-diabetes Barbara Mcmahon will continue to work on weight loss, exercise, and decreasing simple carbohydrates to help decrease the risk of diabetes. We will refill metformin for 1 month, and we will recheck labs in 1 month.  2. Vitamin D deficiency Low Vitamin D level contributes to fatigue and are associated with obesity, breast, and colon cancer. We will refill prescription Vitamin D for 1 month, and we will recheck labs next month. Barbara Mcmahon will follow-up for routine testing of Vitamin D, at least 2-3 times per year to avoid over-replacement.  3. Elevated blood pressure reading Barbara Mcmahon will continue working on healthy weight loss, diet, and exercise to improve blood pressure control. We will watch for signs of hypotension as she continues her lifestyle  modifications. We will recheck her blood pressure in 1 month.  4. Class 3 severe obesity with serious comorbidity and body mass index (BMI) of 45.0 to 49.9 in adult, unspecified obesity type (HCC) Barbara Mcmahon is currently in the action stage of change. As such, her goal is to continue with weight loss efforts. She has agreed to the Category 4 Plan.   We will refer to Barbara Mcmahon at [facility]. Weight loss surgery types were discussed in depth, and she currently is more interested in having the sleeve gastrectomy.   We will recheck fasting labs at her next visit.  Behavioral modification strategies: keeping healthy foods in the home and holiday eating strategies .  Barbara Mcmahon has agreed to follow-up with our clinic in 4 weeks. She was informed of the importance of frequent follow-up visits to maximize her success with intensive lifestyle modifications for her multiple health conditions.   Objective:   Blood pressure (!) 145/75, pulse 87, temperature 97.7 F (36.5 C), height 5\' 5"  (1.651 m), weight 297 lb (134.7 kg), SpO2 98 %. Body mass index is 49.42 kg/m.  General: Cooperative, alert, well developed, in no acute distress. HEENT: Conjunctivae and lids unremarkable. Cardiovascular: Regular rhythm.  Lungs: Normal work of breathing. Neurologic: No focal deficits.   Lab Results  Component Value Date   CREATININE 0.76 06/16/2020   BUN 14 06/16/2020   NA 138 06/16/2020   K 4.3 06/16/2020   CL 105 06/16/2020   CO2 26 06/16/2020   Lab Results  Component Value Date   ALT 20 06/16/2020   AST 19 06/16/2020   ALKPHOS 81 06/16/2020  BILITOT 0.4 06/16/2020   Lab Results  Component Value Date   HGBA1C 5.8 (H) 04/20/2020   HGBA1C 5.9 (H) 01/27/2020   HGBA1C 5.9 (H) 09/24/2019   HGBA1C 5.8 (H) 10/30/2018   Lab Results  Component Value Date   INSULIN 16.4 04/20/2020   INSULIN 67.1 (H) 01/27/2020   INSULIN 17.0 09/24/2019   INSULIN 10.3 10/30/2018   Lab Results   Component Value Date   TSH 2.280 10/30/2018   Lab Results  Component Value Date   CHOL 193 04/20/2020   HDL 48 04/20/2020   LDLCALC 127 (H) 04/20/2020   TRIG 98 04/20/2020   Lab Results  Component Value Date   WBC 7.8 06/16/2020   HGB 13.9 06/16/2020   HCT 41.4 06/16/2020   MCV 89.0 06/16/2020   PLT 330 06/16/2020   No results found for: IRON, TIBC, FERRITIN  Attestation Statements:   Reviewed by clinician on day of visit: allergies, medications, problem list, medical history, surgical history, family history, social history, and previous encounter notes.  Time spent on visit including pre-visit chart review and post-visit care and charting was 46 minutes.    I, Trixie Dredge, am acting as transcriptionist for Dennard Nip, MD.  I have reviewed the above documentation for accuracy and completeness, and I agree with the above. -  Dennard Nip, MD

## 2020-09-22 DIAGNOSIS — F411 Generalized anxiety disorder: Secondary | ICD-10-CM | POA: Diagnosis not present

## 2020-09-26 ENCOUNTER — Encounter: Payer: Self-pay | Admitting: Family Medicine

## 2020-09-26 ENCOUNTER — Other Ambulatory Visit: Payer: Self-pay

## 2020-09-26 ENCOUNTER — Ambulatory Visit (INDEPENDENT_AMBULATORY_CARE_PROVIDER_SITE_OTHER): Payer: BC Managed Care – PPO | Admitting: Family Medicine

## 2020-09-26 VITALS — BP 122/87 | HR 96 | Ht 65.0 in | Wt 297.0 lb

## 2020-09-26 DIAGNOSIS — Z91013 Allergy to seafood: Secondary | ICD-10-CM | POA: Diagnosis not present

## 2020-09-26 DIAGNOSIS — F902 Attention-deficit hyperactivity disorder, combined type: Secondary | ICD-10-CM | POA: Diagnosis not present

## 2020-09-26 DIAGNOSIS — E8881 Metabolic syndrome: Secondary | ICD-10-CM

## 2020-09-26 DIAGNOSIS — Z6841 Body Mass Index (BMI) 40.0 and over, adult: Secondary | ICD-10-CM | POA: Diagnosis not present

## 2020-09-26 MED ORDER — AMPHETAMINE-DEXTROAMPHET ER 20 MG PO CP24: 20.0000 mg | ORAL_CAPSULE | Freq: Every morning | ORAL | 0 refills | Status: DC

## 2020-09-26 MED ORDER — EPINEPHRINE 0.3 MG/0.3ML IJ SOAJ: 0.3000 mg | Freq: Once | INTRAMUSCULAR | 1 refills | Status: DC | PRN

## 2020-09-26 MED ORDER — AMPHETAMINE-DEXTROAMPHETAMINE 10 MG PO TABS: 10.0000 mg | ORAL_TABLET | Freq: Every day | ORAL | 0 refills | Status: DC

## 2020-09-26 NOTE — Assessment & Plan Note (Signed)
Refilled Epi Pen

## 2020-09-26 NOTE — Assessment & Plan Note (Signed)
Working with Dr. Leafy Ro considering gastric sleeve sometime next year.

## 2020-09-26 NOTE — Progress Notes (Signed)
Doing well on current regimen. Denies any SOB,CP, insomnia.

## 2020-09-26 NOTE — Progress Notes (Signed)
Established Patient Office Visit  Subjective:  Patient ID: Barbara Mcmahon, female    DOB: Mar 18, 1976  Age: 44 y.o. MRN: [id number]  CC:  Chief Complaint  Patient presents with  . ADHD         HPI Alaira Level presents for   ADD - Reports symptoms are well controlled on current regime. Denies any problems with insomnia, chest pain, palpitations, or SOB.      Still following with Dr. Leafy Ro for weight loss.  After 2 years of really making some major lifestyle changes she just has not made the progress that she would like and they are actually discussing gastric sleeve surgery.  She is going to follow-up with OB/GYN for her PCOS 1 more time.  Past Medical History:  Diagnosis Date  . Abnormal Pap smear and cervical HPV (human papillomavirus)   . Anxiety   . Arthritis    BAck and sacral joint  . Back pain   . Colon polyps   . Constipation   . Gallbladder problem   . Generalized anxiety disorder   . IBS (irritable bowel syndrome)   . Infertility, female   . Joint pain   . Multiple food allergies    Shellfish, cinnamon and yeast  . Obesity   . PCOS (polycystic ovarian syndrome)   . Prediabetes   . Pulmonary embolism ([place])   . Seizure disorder ([place])   . Vitamin D deficiency     Past Surgical History:  Procedure Laterality Date  . BREAST BIOPSY Right 2019   benign  . CHOLECYSTECTOMY    . COLPOSCOPY    . DILATION AND CURETTAGE OF UTERUS    . GYNECOLOGIC CRYOSURGERY    . KNEE SURGERY  right    Family History  Problem Relation Age of Onset  . Hypertension Father   . Depression Father   . Anxiety disorder Father   . Sleep apnea Father   . Obesity Father   . Diabetes Paternal Grandmother   . Hypertension Paternal Grandmother   . Stroke Paternal Grandmother   . Diabetes Paternal Grandfather   . Hypertension Paternal Grandfather   . Stroke Paternal Grandfather   . Hypertension Mother   . Depression Mother   . Anxiety disorder Mother   . Eating disorder  Mother     Social History   Socioeconomic History  . Marital status: Married    Spouse name: Zara Wendt  . Number of children: 2  . Years of education: Not on file  . Highest education level: Not on file  Occupational History  . Occupation: Art gallery manager  Tobacco Use  . Smoking status: Never Smoker  . Smokeless tobacco: Never Used  Vaping Use  . Vaping Use: Never used  Substance and Sexual Activity  . Alcohol use: No  . Drug use: No  . Sexual activity: Not Currently  Other Topics Concern  . Not on file  Social History Narrative  . Not on file   Social Determinants of Health   Financial Resource Strain: Not on file  Food Insecurity: Not on file  Transportation Needs: Not on file  Physical Activity: Not on file  Stress: Not on file  Social Connections: Not on file  Intimate Partner Violence: Not on file    Outpatient Medications Prior to Visit  Medication Sig Dispense Refill  . acetaminophen (TYLENOL) 500 MG tablet Take 500 mg by mouth every 6 (six) hours as needed.    Marland Kitchen apixaban (ELIQUIS) 5 MG TABS  tablet Take 1 tablet (5 mg total) by mouth 2 (two) times daily. 180 tablet 3  . CRANBERRY PO Take by mouth daily.    . cyclobenzaprine (FLEXERIL) 10 MG tablet Take 10 mg by mouth 3 (three) times daily as needed.     . DULoxetine (CYMBALTA) 60 MG capsule Take 1 capsule (60 mg total) by mouth 2 (two) times daily. Take 2 capsules by mouth daily 180 capsule 0  . LATUDA 20 MG TABS tablet Take 20 mg by mouth daily.    . Melatonin 1 MG CAPS Take 1 capsule by mouth at bedtime.    . metFORMIN (GLUCOPHAGE) 500 MG tablet Take 1 tablet (500 mg total) by mouth daily with breakfast. 30 tablet 0  . Multiple Vitamin (MULTI-VITAMIN) tablet Take by mouth.    . triamcinolone ointment (KENALOG) 0.1 % Apply 1 application topically 2 (two) times daily.    . Vitamin D, Ergocalciferol, (DRISDOL) 1.25 MG (50000 UNIT) CAPS capsule Take 1 capsule (50,000 Units total) by mouth  every 7 (seven) days. 4 capsule 0  . amphetamine-dextroamphetamine (ADDERALL XR) 20 MG 24 hr capsule Take 1 capsule (20 mg total) by mouth in the morning. 30 capsule 0  . amphetamine-dextroamphetamine (ADDERALL) 10 MG tablet Take 1 tablet (10 mg total) by mouth daily after lunch. 30 tablet 0  . EPINEPHrine (EPI-PEN) 0.3 mg/0.3 mL SOAJ injection Inject 0.3 mLs (0.3 mg total) into the muscle once as needed (anaphylaxis). 1 Device 1   No facility-administered medications prior to visit.    Allergies  Allergen Reactions  . Cinnamon Shortness Of Breath and Rash    Wheezing   . Morphine And Related Other (See Comments), Hives and Rash    Big mood changes BEHAVIOR CHANGES   . Nutmeg Oil (Myristica Oil) Shortness Of Breath and Rash    wheezing  . Shellfish Allergy Hives, Swelling and Anaphylaxis    Throat closes  . Shrimp Extract Allergy Skin Test     Other reaction(s): Wheezing  . Codeine Hives and Itching    Pt says she can take percocet  . Clonazepam Itching  . Robitussin A-C [Guaifenesin-Codeine] Itching  . Tramadol Itching  . Yeast-Related Products Hives    ROS Review of Systems    Objective:    Physical Exam Constitutional:      Appearance: She is well-developed and well-nourished.  HENT:     Head: Normocephalic and atraumatic.  Cardiovascular:     Rate and Rhythm: Normal rate and regular rhythm.     Heart sounds: Normal heart sounds.  Pulmonary:     Effort: Pulmonary effort is normal.     Breath sounds: Normal breath sounds.  Skin:    General: Skin is warm and dry.  Neurological:     Mental Status: She is alert and oriented to person, place, and time.  Psychiatric:        Mood and Affect: Mood and affect normal.        Behavior: Behavior normal.     BP 122/87   Pulse 96   Ht 5\' 5"  (1.651 m)   Wt 297 lb (134.7 kg)   SpO2 100%   BMI 49.42 kg/m  Wt Readings from Last 3 Encounters:  09/26/20 297 lb (134.7 kg)  09/19/20 297 lb (134.7 kg)  08/29/20 296 lb  (134.3 kg)     Health Maintenance Due  Topic Date Due  . TETANUS/TDAP  01/12/2020    There are no preventive care reminders to display for this  patient.  Lab Results  Component Value Date   TSH 2.280 10/30/2018   Lab Results  Component Value Date   WBC 7.8 06/16/2020   HGB 13.9 06/16/2020   HCT 41.4 06/16/2020   MCV 89.0 06/16/2020   PLT 330 06/16/2020   Lab Results  Component Value Date   NA 138 06/16/2020   K 4.3 06/16/2020   CO2 26 06/16/2020   GLUCOSE 126 (H) 06/16/2020   BUN 14 06/16/2020   CREATININE 0.76 06/16/2020   BILITOT 0.4 06/16/2020   ALKPHOS 81 06/16/2020   AST 19 06/16/2020   ALT 20 06/16/2020   PROT 7.3 06/16/2020   ALBUMIN 4.2 06/16/2020   CALCIUM 9.4 06/16/2020   ANIONGAP 7 06/16/2020   Lab Results  Component Value Date   CHOL 193 04/20/2020   Lab Results  Component Value Date   HDL 48 04/20/2020   Lab Results  Component Value Date   LDLCALC 127 (H) 04/20/2020   Lab Results  Component Value Date   TRIG 98 04/20/2020   No results found for: CHOLHDL Lab Results  Component Value Date   HGBA1C 5.8 (H) 04/20/2020      Assessment & Plan:   Problem List Items Addressed This Visit      Endocrine   Insulin resistance - Primary    Well controlled.  Dr. Redgie Grayer has been checking this for her and in fact again to recheck again at her next appointment.        Other   Shrimp allergy    Refilled EpiPen.      Relevant Medications   EPINEPHrine 0.3 mg/0.3 mL IJ SOAJ injection   BMI 45.0-49.9, adult [facility])    Working with Dr. Leafy Ro considering gastric sleeve sometime next year.      Relevant Medications   amphetamine-dextroamphetamine (ADDERALL XR) 20 MG 24 hr capsule (Start on 10/13/2020)   amphetamine-dextroamphetamine (ADDERALL) 10 MG tablet (Start on 10/13/2020)   Attention deficit hyperactivity disorder (ADHD), combined type    Doing well on current regimen.  We will go ahead and send refills for January.  Call if any  problems or concerns.      Relevant Medications   amphetamine-dextroamphetamine (ADDERALL XR) 20 MG 24 hr capsule (Start on 10/13/2020)   amphetamine-dextroamphetamine (ADDERALL) 10 MG tablet (Start on 10/13/2020)      Meds ordered this encounter  Medications  . EPINEPHrine 0.3 mg/0.3 mL IJ SOAJ injection    Sig: Inject 0.3 mg into the muscle once as needed (anaphylaxis).    Dispense:  1 each    Refill:  1  . amphetamine-dextroamphetamine (ADDERALL XR) 20 MG 24 hr capsule    Sig: Take 1 capsule (20 mg total) by mouth in the morning.    Dispense:  30 capsule    Refill:  0  . amphetamine-dextroamphetamine (ADDERALL) 10 MG tablet    Sig: Take 1 tablet (10 mg total) by mouth daily after lunch.    Dispense:  30 tablet    Refill:  0    Follow-up: Return in about 4 months (around 01/25/2021) for ADD.    Beatrice Lecher, MD

## 2020-09-26 NOTE — Assessment & Plan Note (Signed)
Doing well on current regimen.  We will go ahead and send refills for January.  Call if any problems or concerns.

## 2020-09-26 NOTE — Assessment & Plan Note (Signed)
Well controlled.  Dr. Redgie Grayer has been checking this for her and in fact again to recheck again at her next appointment.

## 2020-10-12 DIAGNOSIS — M5387 Other specified dorsopathies, lumbosacral region: Secondary | ICD-10-CM | POA: Diagnosis not present

## 2020-10-12 DIAGNOSIS — M47817 Spondylosis without myelopathy or radiculopathy, lumbosacral region: Secondary | ICD-10-CM | POA: Diagnosis not present

## 2020-10-12 DIAGNOSIS — M609 Myositis, unspecified: Secondary | ICD-10-CM | POA: Diagnosis not present

## 2020-10-12 DIAGNOSIS — M5137 Other intervertebral disc degeneration, lumbosacral region: Secondary | ICD-10-CM | POA: Diagnosis not present

## 2020-10-16 DIAGNOSIS — Z20822 Contact with and (suspected) exposure to covid-19: Secondary | ICD-10-CM | POA: Diagnosis not present

## 2020-10-17 DIAGNOSIS — F411 Generalized anxiety disorder: Secondary | ICD-10-CM | POA: Diagnosis not present

## 2020-10-18 ENCOUNTER — Telehealth (INDEPENDENT_AMBULATORY_CARE_PROVIDER_SITE_OTHER): Payer: BC Managed Care – PPO | Admitting: Physician Assistant

## 2020-10-18 ENCOUNTER — Encounter: Payer: Self-pay | Admitting: Physician Assistant

## 2020-10-18 DIAGNOSIS — J069 Acute upper respiratory infection, unspecified: Secondary | ICD-10-CM | POA: Diagnosis not present

## 2020-10-18 DIAGNOSIS — J0100 Acute maxillary sinusitis, unspecified: Secondary | ICD-10-CM

## 2020-10-18 MED ORDER — FLUCONAZOLE 150 MG PO TABS
150.0000 mg | ORAL_TABLET | Freq: Once | ORAL | 0 refills | Status: AC
Start: 2020-10-18 — End: 2020-10-18

## 2020-10-18 MED ORDER — CEFDINIR 300 MG PO CAPS: 300.0000 mg | ORAL_CAPSULE | Freq: Two times a day (BID) | ORAL | 0 refills | Status: DC

## 2020-10-18 NOTE — Progress Notes (Signed)
Pt stated that she took a home test on Sunday and this was negative.No sick contacts. Not taking any OTC medications.   Denies any f/s/c/n/d.   She reports sxs sinus pain and pressure only on the L side of face. Sinus drainage, runny nose, watery eyes, headache,and some aches in her neck.   Pt believes that its only a sinus infection and the mucus is bright yellow with a green tint.    She has felt really fatigued since this started.   Reports that she has had COVID before and doesn't feel that this is what she has now.

## 2020-10-18 NOTE — Progress Notes (Signed)
Patient ID: Barbara Mcmahon, female   DOB: [birth year], 45 y.o.   MRN: [id number] .Marland KitchenVirtual Visit via Telephone Note  I connected with Barbara Mcmahon on 10/18/20 at  2:40 PM EST by telephone and verified that I am speaking with the correct person using two identifiers.  Location: Patient: home Provider: clinic  .Marland KitchenParticipating in visit:  Patient: Barbara Mcmahon Provider: Iran Planas PA-C   I discussed the limitations, risks, security and privacy concerns of performing an evaluation and management service by telephone and the availability of in person appointments. I also discussed with the patient that there may be a patient responsible charge related to this service. The patient expressed understanding and agreed to proceed.   History of Present Illness: Patient is a 45 year old female with a history of chronic maxillary sinusitis who presents to the clinic with about 6 days of URI head cold symptoms.  She did take a COVID home test on Sunday and was negative.  She has no other sick contacts.  She has not been taking any other over-the-counter medications.  She has had COVID in the past and feels like this is not COVID.  She does have a lot of left sided facial pain and gum tenderness.  She even had some gum bleeding this morning.  She is on an anticoagulant, Eliquis.  She denies any fever, chills, sweats, nausea, vomiting, diarrhea.  She is blowing out bright yellow with green tent discharge.  She has had her COVID-vaccine.  .. Active Ambulatory Problems    Diagnosis Date Noted  . Obesity, morbid (more than 100 lbs over ideal weight or BMI > 40) ([place]) 06/30/2012  . Anxiety 06/30/2012  . PCOS (polycystic ovarian syndrome) 06/30/2012  . Irritable bowel syndrome with diarrhea (colonoscopy 2007) 07/04/2012  . Raynaud disease 11/19/2012  . Family history of hypertrophic cardiomyopathy 04/23/2013  . MRSA (methicillin resistant Staphylococcus aureus) 01/14/2015  . Shrimp allergy  09/24/2016  . 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 GAD (generalized anxiety disorder) 04/08/2017  . 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 Pulmonary embolism ([place]) 07/04/2017  . Chronic maxillary sinusitis 08/22/2018  . Insulin resistance 04/01/2019  . BMI 45.0-49.9, adult ([place]) 04/01/2019  . Constipation due to slow transit 04/01/2019  . Prediabetes 03/17/2015  . Dyslipidemia 03/17/2015  . Vitamin D deficiency 05/21/2019  . Class 3 severe obesity with serious comorbidity and body mass index (BMI) of 45.0 to 49.9 in adult ([place]) 05/21/2019  . Attention deficit hyperactivity disorder (ADHD), 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  . At risk for diabetes mellitus 07/06/2020   Resolved Ambulatory Problems    Diagnosis Date Noted  . Acute maxillary sinusitis 06/30/2012  . Preventive measure 11/18/2013  . Cellulitis and abscess of trunk 12/01/2014  . Cellulitis 01/14/2015  . Pain of right upper extremity 08/09/2015   Past Medical History:  Diagnosis Date  . Abnormal Pap smear and cervical HPV (human papillomavirus)   . Arthritis   . Back pain   . Colon polyps   . Constipation   . Gallbladder problem   . Generalized anxiety disorder   . IBS (irritable bowel syndrome)   . Infertility, female   . Joint pain   . Multiple food allergies   . Obesity   . Seizure disorder ([place])    Reviewed med, allergy, problem list.     Observations/Objective: No acute distress Normal labored breathing and no wheezing.  No cough.   No vitals.    Assessment and Plan: Marland KitchenMarland KitchenRebeca was seen today for sinusitis.  Diagnoses  and all orders for this visit:  Acute non-recurrent maxillary sinusitis -     cefdinir (OMNICEF) 300 MG capsule; Take 1 capsule (300 mg total) by mouth 2 (two) times daily. For 10 days. -     fluconazole (DIFLUCAN) 150 MG tablet; Take 1 tablet (150 mg total) by mouth once for 1 dose.  Viral upper respiratory tract infection   Patient does have a history of sinusitis.  She did test  negative for COVID.  Symptoms have been going on for 6 days she is in that 1 week window where secondary bacterial infections can occur.  She does not respond well to penicillins.  We will try Omnicef for 10 days.  Encourage Flonase as well as Mucinex.  Rest and hydrate.  Follow-up with any symptoms worsen or change.  No need to quarantine as she can go to work and not contagious. Diflucan given for hx of yeast infection after abx.    Follow Up Instructions:    I discussed the assessment and treatment plan with the patient. The patient was provided an opportunity to ask questions and all were answered. The patient agreed with the plan and demonstrated an understanding of the instructions.   The patient was advised to call back or seek an in-person evaluation if the symptoms worsen or if the condition fails to improve as anticipated.  I provided 10 minutes of non-face-to-face time during this encounter.   Iran Planas, PA-C

## 2020-10-19 ENCOUNTER — Telehealth (INDEPENDENT_AMBULATORY_CARE_PROVIDER_SITE_OTHER): Payer: BC Managed Care – PPO | Admitting: Family Medicine

## 2020-10-19 ENCOUNTER — Encounter (INDEPENDENT_AMBULATORY_CARE_PROVIDER_SITE_OTHER): Payer: Self-pay

## 2020-10-20 ENCOUNTER — Encounter: Payer: Self-pay | Admitting: Family Medicine

## 2020-10-28 ENCOUNTER — Other Ambulatory Visit: Payer: Self-pay | Admitting: Hematology & Oncology

## 2020-10-28 DIAGNOSIS — R0602 Shortness of breath: Secondary | ICD-10-CM

## 2020-10-31 ENCOUNTER — Ambulatory Visit
Admission: RE | Admit: 2020-10-31 | Discharge: 2020-10-31 | Disposition: A | Discharge status: Home / Self Care | Payer: BC Managed Care – PPO | Source: Ambulatory Visit | Attending: Obstetrics and Gynecology | Admitting: Obstetrics and Gynecology

## 2020-10-31 ENCOUNTER — Other Ambulatory Visit: Payer: Self-pay | Admitting: Obstetrics and Gynecology

## 2020-10-31 ENCOUNTER — Other Ambulatory Visit: Payer: Self-pay

## 2020-10-31 DIAGNOSIS — R928 Other abnormal and inconclusive findings on diagnostic imaging of breast: Secondary | ICD-10-CM

## 2020-10-31 DIAGNOSIS — F411 Generalized anxiety disorder: Secondary | ICD-10-CM | POA: Diagnosis not present

## 2020-10-31 DIAGNOSIS — N6012 Diffuse cystic mastopathy of left breast: Secondary | ICD-10-CM | POA: Diagnosis not present

## 2020-10-31 DIAGNOSIS — N6321 Unspecified lump in the left breast, upper outer quadrant: Secondary | ICD-10-CM | POA: Diagnosis not present

## 2020-10-31 DIAGNOSIS — N6011 Diffuse cystic mastopathy of right breast: Secondary | ICD-10-CM | POA: Diagnosis not present

## 2020-10-31 DIAGNOSIS — R922 Inconclusive mammogram: Secondary | ICD-10-CM | POA: Diagnosis not present

## 2020-11-04 DIAGNOSIS — R7303 Prediabetes: Secondary | ICD-10-CM | POA: Diagnosis not present

## 2020-11-04 DIAGNOSIS — Z6841 Body Mass Index (BMI) 40.0 and over, adult: Secondary | ICD-10-CM | POA: Diagnosis not present

## 2020-11-04 DIAGNOSIS — R635 Abnormal weight gain: Secondary | ICD-10-CM | POA: Diagnosis not present

## 2020-11-04 DIAGNOSIS — E282 Polycystic ovarian syndrome: Secondary | ICD-10-CM | POA: Diagnosis not present

## 2020-11-14 DIAGNOSIS — F411 Generalized anxiety disorder: Secondary | ICD-10-CM | POA: Diagnosis not present

## 2020-11-21 ENCOUNTER — Encounter (INDEPENDENT_AMBULATORY_CARE_PROVIDER_SITE_OTHER): Payer: BC Managed Care – PPO | Admitting: Family Medicine

## 2020-11-21 ENCOUNTER — Encounter (INDEPENDENT_AMBULATORY_CARE_PROVIDER_SITE_OTHER): Payer: Self-pay | Admitting: Family Medicine

## 2020-11-21 ENCOUNTER — Other Ambulatory Visit: Payer: Self-pay | Admitting: *Deleted

## 2020-11-21 ENCOUNTER — Encounter: Payer: Self-pay | Admitting: Family Medicine

## 2020-11-21 DIAGNOSIS — F902 Attention-deficit hyperactivity disorder, combined type: Secondary | ICD-10-CM

## 2020-11-22 MED ORDER — AMPHETAMINE-DEXTROAMPHET ER 20 MG PO CP24
20.0000 mg | ORAL_CAPSULE | Freq: Every morning | ORAL | 0 refills | Status: DC
Start: 2020-11-22 — End: 2020-12-21

## 2020-11-22 MED ORDER — AMPHETAMINE-DEXTROAMPHETAMINE 10 MG PO TABS
10.0000 mg | ORAL_TABLET | Freq: Every day | ORAL | 0 refills | Status: DC
Start: 2020-11-22 — End: 2020-12-21

## 2020-11-22 NOTE — Progress Notes (Signed)
meds sent

## 2020-11-28 DIAGNOSIS — F411 Generalized anxiety disorder: Secondary | ICD-10-CM | POA: Diagnosis not present

## 2020-11-29 NOTE — Telephone Encounter (Signed)
Was this given to [place]?

## 2020-12-13 ENCOUNTER — Telehealth: Payer: Self-pay

## 2020-12-13 NOTE — Telephone Encounter (Signed)
Pt called in to r/s her appt due to to a conflict

## 2020-12-16 ENCOUNTER — Inpatient Hospital Stay: Payer: BC Managed Care – PPO | Admitting: Hematology & Oncology

## 2020-12-16 ENCOUNTER — Inpatient Hospital Stay: Payer: BC Managed Care – PPO

## 2020-12-20 ENCOUNTER — Encounter: Payer: Self-pay | Admitting: Family Medicine

## 2020-12-20 DIAGNOSIS — F902 Attention-deficit hyperactivity disorder, combined type: Secondary | ICD-10-CM

## 2020-12-21 MED ORDER — AMPHETAMINE-DEXTROAMPHETAMINE 10 MG PO TABS: 10.0000 mg | ORAL_TABLET | Freq: Every day | ORAL | 0 refills | Status: DC

## 2020-12-21 MED ORDER — AMPHETAMINE-DEXTROAMPHET ER 20 MG PO CP24: 20.0000 mg | ORAL_CAPSULE | Freq: Every morning | ORAL | 0 refills | Status: DC

## 2020-12-21 NOTE — Telephone Encounter (Signed)
Med sent. Yes I would like to see here when she gets back

## 2020-12-22 ENCOUNTER — Encounter: Payer: Self-pay | Admitting: Medical-Surgical

## 2020-12-22 ENCOUNTER — Other Ambulatory Visit: Payer: Self-pay

## 2020-12-22 ENCOUNTER — Ambulatory Visit (INDEPENDENT_AMBULATORY_CARE_PROVIDER_SITE_OTHER): Payer: 59

## 2020-12-22 ENCOUNTER — Ambulatory Visit: Payer: BC Managed Care – PPO | Admitting: Medical-Surgical

## 2020-12-22 VITALS — BP 142/73 | HR 84 | Temp 98.4°F | Ht 65.0 in | Wt 311.3 lb

## 2020-12-22 DIAGNOSIS — R071 Chest pain on breathing: Secondary | ICD-10-CM | POA: Diagnosis not present

## 2020-12-22 DIAGNOSIS — Z8679 Personal history of other diseases of the circulatory system: Secondary | ICD-10-CM

## 2020-12-22 DIAGNOSIS — R0602 Shortness of breath: Secondary | ICD-10-CM

## 2020-12-22 MED ORDER — IOHEXOL 350 MG/ML SOLN
100.0000 mL | Freq: Once | INTRAVENOUS | Status: AC | PRN
  Administered 2020-12-22: 100 mL via INTRAVENOUS

## 2020-12-22 NOTE — Telephone Encounter (Signed)
Patient seen by Caryl Asp today.

## 2020-12-22 NOTE — Progress Notes (Addendum)
Subjective:    CC: new onset dyspnea  HPI: Pleasant 45 year old female presenting with complaints of new onset dyspnea. She was in [place] new co-workers for the first time on a business trip. They were doing a lot of walking and she noted that after about 5 minutes, she was forced to stop and sit down because of severe chest tightness, substernal chest pain, and lightheadedness. These symptoms started yesterday and have continued through to this morning. She has a history of PE and reports her current symptoms are identical to what she experienced before. No DVT symptoms, headaches, fevers, chills, lower extremity edema, nausea, diaphoresis, or radiation of pain.   I reviewed the past medical history, family history, social history, surgical history, and allergies today and no changes were needed.  Please see the problem list section below in epic for further details.  Past Medical History: Past Medical History:  Diagnosis Date  . Abnormal Pap smear and cervical HPV (human papillomavirus)   . Anxiety   . Arthritis    BAck and sacral joint  . Back pain   . Colon polyps   . Constipation   . Gallbladder problem   . Generalized anxiety disorder   . IBS (irritable bowel syndrome)   . Infertility, female   . Joint pain   . Multiple food allergies    Shellfish, cinnamon and yeast  . Obesity   . PCOS (polycystic ovarian syndrome)   . Prediabetes   . Pulmonary embolism ([place])   . Seizure disorder ([place])   . Vitamin D deficiency    Past Surgical History: Past Surgical History:  Procedure Laterality Date  . BREAST BIOPSY Right 2019   benign  . CHOLECYSTECTOMY    . COLPOSCOPY    . DILATION AND CURETTAGE OF UTERUS    . GYNECOLOGIC CRYOSURGERY    . KNEE SURGERY  right   Social History: Social History   Socioeconomic History  . Marital status: Married    Spouse name: Tanna Loeffler  . Number of children: 2  . Years of education: Not on file  . Highest education level:  Not on file  Occupational History  . Occupation: Art gallery manager  Tobacco Use  . Smoking status: Never Smoker  . Smokeless tobacco: Never Used  Vaping Use  . Vaping Use: Never used  Substance and Sexual Activity  . Alcohol use: No  . Drug use: No  . Sexual activity: Not Currently  Other Topics Concern  . Not on file  Social History Narrative  . Not on file   Social Determinants of Health   Financial Resource Strain: Not on file  Food Insecurity: Not on file  Transportation Needs: Not on file  Physical Activity: Not on file  Stress: Not on file  Social Connections: Not on file   Family History: Family History  Problem Relation Age of Onset  . Hypertension Father   . Depression Father   . Anxiety disorder Father   . Sleep apnea Father   . Obesity Father   . Diabetes Paternal Grandmother   . Hypertension Paternal Grandmother   . Stroke Paternal Grandmother   . Diabetes Paternal Grandfather   . Hypertension Paternal Grandfather   . Stroke Paternal Grandfather   . Hypertension Mother   . Depression Mother   . Anxiety disorder Mother   . Eating disorder Mother    Allergies: Allergies  Allergen Reactions  . Cinnamon Shortness Of Breath and Rash    Wheezing   .  Guaifenesin-Codeine Itching  . Morphine Itching, Rash and Hives    Other reaction(s): Other (See Comments) BEHAVIOR CHANGES Big mood changes BEHAVIOR CHANGES irritable  . Morphine And Related Other (See Comments), Hives and Rash    Big mood changes BEHAVIOR CHANGES   . Nutmeg Oil (Myristica Oil) Shortness Of Breath and Rash    wheezing  . Shellfish Allergy Hives, Swelling and Anaphylaxis    Throat closes  . Shrimp Extract Allergy Skin Test     Other reaction(s): Wheezing  . Codeine Hives and Itching    Pt says she can take percocet  . Clonazepam Itching  . Menthol Hives  . Robitussin A-C [Guaifenesin-Codeine] Itching  . Tramadol Itching  . Yeast Hives  . Yeast-Related Products  Hives   Medications: See med rec.  Review of Systems: See HPI for pertinent positives and negatives.   Objective:    General: Well Developed, well nourished. Anxious.  Neuro: Alert and oriented x3.  HEENT: Normocephalic, atraumatic.  Skin: Warm and dry. Cardiac: Regular rate and rhythm, no murmurs rubs or gallops, no lower extremity edema. No sternal/rib tenderness to palpation.  Respiratory: Clear to auscultation bilaterally. Not using accessory muscles, speaking in full sentences. Increased work of breathing with activity.  Impression and Recommendations:    1. Shortness of breath on exertion/CP on breathing Wells score for PE 4.5% indicating moderate risk of PE. With her history of prior PE and the similarity of symptoms, getting STAT CT angiogram today. EKG in office- NSR with right axis deviation. Checking CBC, CMP, and TSH to evaluate for other potential causes.  - CBC with Differential/Platelet - CT Angio Chest W/Cm &/Or Wo Cm; Future - COMPLETE METABOLIC PANEL WITH GFR - TSH  Return if symptoms worsen or fail to improve. ___________________________________________ Clearnce Sorrel, DNP, APRN, FNP-BC [facility] [place]

## 2020-12-23 ENCOUNTER — Other Ambulatory Visit: Payer: Self-pay | Admitting: Medical-Surgical

## 2020-12-23 ENCOUNTER — Ambulatory Visit (INDEPENDENT_AMBULATORY_CARE_PROVIDER_SITE_OTHER): Payer: 59

## 2020-12-23 DIAGNOSIS — R0602 Shortness of breath: Secondary | ICD-10-CM

## 2020-12-23 DIAGNOSIS — R071 Chest pain on breathing: Secondary | ICD-10-CM

## 2020-12-23 DIAGNOSIS — R42 Dizziness and giddiness: Secondary | ICD-10-CM

## 2020-12-23 LAB — CBC WITH DIFFERENTIAL/PLATELET
Absolute Monocytes: 605 cells/uL (ref 200–950)
Basophils Absolute: 41 cells/uL (ref 0–200)
Basophils Relative: 0.6 %
Eosinophils Absolute: 122 cells/uL (ref 15–500)
Eosinophils Relative: 1.8 %
HCT: 37.7 % (ref 35.0–45.0)
Hemoglobin: 12.7 g/dL (ref 11.7–15.5)
Lymphs Abs: 2305 cells/uL (ref 850–3900)
MCH: 29.7 pg (ref 27.0–33.0)
MCHC: 33.7 g/dL (ref 32.0–36.0)
MCV: 88.3 fL (ref 80.0–100.0)
MPV: 10.1 fL (ref 7.5–12.5)
Monocytes Relative: 8.9 %
Neutro Abs: 3726 cells/uL (ref 1500–7800)
Neutrophils Relative %: 54.8 %
Platelets: 320 10*3/uL (ref 140–400)
RBC: 4.27 10*6/uL (ref 3.80–5.10)
RDW: 12.8 % (ref 11.0–15.0)
Total Lymphocyte: 33.9 %
WBC: 6.8 10*3/uL (ref 3.8–10.8)

## 2020-12-23 LAB — COMPLETE METABOLIC PANEL WITH GFR
AG Ratio: 1.6 (calc) (ref 1.0–2.5)
ALT: 23 U/L (ref 6–29)
AST: 23 U/L (ref 10–35)
Albumin: 3.9 g/dL (ref 3.6–5.1)
Alkaline phosphatase (APISO): 81 U/L (ref 31–125)
BUN: 12 mg/dL (ref 7–25)
CO2: 28 mmol/L (ref 20–32)
Calcium: 8.8 mg/dL (ref 8.6–10.2)
Chloride: 105 mmol/L (ref 98–110)
Creat: 0.71 mg/dL (ref 0.50–1.10)
GFR, Est African American: 119 mL/min/{1.73_m2} (ref >=60)
GFR, Est Non African American: 103 mL/min/{1.73_m2} (ref >=60)
Globulin: 2.5 g/dL (calc) (ref 1.9–3.7)
Glucose, Bld: 116 mg/dL — ABNORMAL HIGH (ref 65–99)
Potassium: 4.5 mmol/L (ref 3.5–5.3)
Sodium: 138 mmol/L (ref 135–146)
Total Bilirubin: 0.4 mg/dL (ref 0.2–1.2)
Total Protein: 6.4 g/dL (ref 6.1–8.1)

## 2020-12-23 LAB — TSH: TSH: 1.84 mIU/L

## 2020-12-23 MED ORDER — ALBUTEROL SULFATE HFA 108 (90 BASE) MCG/ACT IN AERS: 2.0000 | INHALATION_SPRAY | Freq: Four times a day (QID) | RESPIRATORY_TRACT | 1 refills | Status: DC | PRN

## 2020-12-26 LAB — HM PAP SMEAR: HM Pap smear: UNDETERMINED

## 2020-12-27 NOTE — Addendum Note (Signed)
Addended by: Narda Rutherford on: 12/27/2020 10:16 AM   Modules accepted: Orders

## 2021-01-10 ENCOUNTER — Encounter: Payer: Self-pay | Admitting: Family Medicine

## 2021-01-13 NOTE — Telephone Encounter (Signed)
Hi [name], I put the form in your basket and went ahead and signed it.  If you can call her and get all the details.  We can do the handwritten version we just have to have her comorbidities all her attempts at weight loss in the last 5 years of weights which we should have in our system.

## 2021-01-16 ENCOUNTER — Encounter: Payer: Self-pay | Admitting: Medical-Surgical

## 2021-01-17 ENCOUNTER — Other Ambulatory Visit: Payer: Self-pay | Admitting: Family Medicine

## 2021-01-17 ENCOUNTER — Telehealth: Payer: Self-pay | Admitting: *Deleted

## 2021-01-17 DIAGNOSIS — U071 COVID-19: Secondary | ICD-10-CM

## 2021-01-17 NOTE — Telephone Encounter (Signed)
Referral entered  

## 2021-01-17 NOTE — Telephone Encounter (Signed)
Pt's husband calls this afternoon stating that his wife just tested positive for covid today and that this is the 2nd time this year. Pt's current O2 sat is 96% and is having quite a bit of coughing.  Please advise.  They are requesting an infusion.

## 2021-01-18 ENCOUNTER — Telehealth: Payer: Self-pay | Admitting: Adult Health

## 2021-01-18 ENCOUNTER — Other Ambulatory Visit: Payer: Self-pay

## 2021-01-18 ENCOUNTER — Telehealth (INDEPENDENT_AMBULATORY_CARE_PROVIDER_SITE_OTHER): Payer: 59 | Admitting: Sports Medicine

## 2021-01-18 ENCOUNTER — Ambulatory Visit (HOSPITAL_BASED_OUTPATIENT_CLINIC_OR_DEPARTMENT_OTHER): Payer: 59

## 2021-01-18 ENCOUNTER — Telehealth (HOSPITAL_COMMUNITY): Payer: Self-pay

## 2021-01-18 DIAGNOSIS — Z8616 Personal history of COVID-19: Secondary | ICD-10-CM | POA: Insufficient documentation

## 2021-01-18 DIAGNOSIS — U071 COVID-19: Secondary | ICD-10-CM

## 2021-01-18 MED ORDER — DEXAMETHASONE 4 MG PO TABS: 4.0000 mg | ORAL_TABLET | Freq: Three times a day (TID) | ORAL | 0 refills | Status: DC

## 2021-01-18 NOTE — Telephone Encounter (Signed)
Called to discuss with patient about COVID-19 symptoms and the use of one of the available treatments for those with mild to moderate Covid symptoms and at a high risk of hospitalization.  Pt appears to qualify for outpatient treatment due to co-morbid conditions and/or a member of an at-risk group in accordance with the FDA Emergency Use Authorization.    Barbara Mcmahon is interested in receiving Bebtelovimab, however unfortunately our clinic is full today, and this is currently being given weekly.  She is not a candidate for paxlovid due to medication interactions.  I reviewed molnupiravir with her and the benefit, and she declined, as the risk seems to outweigh the benefit.  I put her on a list, and we will call her if our clinic develops any openings within the next week.   [name]

## 2021-01-18 NOTE — Progress Notes (Signed)
   Virtual Visit via WebEx/MyChart   I connected with  Barbara Mcmahon  on 01/18/21 via WebEx/MyChart/Doximity Video and verified that I am speaking with the correct person using two identifiers.   I discussed the limitations, risks, security and privacy concerns of performing an evaluation and management service by WebEx/MyChart/Doximity Video, including the higher likelihood of inaccurate diagnosis and treatment, and the availability of in person appointments.  We also discussed the likely need of an additional face to face encounter for complete and high quality delivery of care.  I also discussed with the patient that there may be a patient responsible charge related to this service. The patient expressed understanding and wishes to proceed.  Provider location is in medical facility. Patient location is at their home, different from provider location. People involved in care of the patient during this telehealth encounter were myself, my nurse/medical assistant, and my front office/scheduling team member.  Review of Systems: No fevers, chills, night sweats, weight loss, chest pain, or shortness of breath.   Objective Findings:    General: Speaking full sentences, no audible heavy breathing.  Sounds alert and appropriately interactive.  Appears well.  Face symmetric.  Extraocular movements intact.  Pupils equal and round.  No nasal flaring or accessory muscle use visualized.  Independent interpretation of tests performed by another provider:   None.  Brief History, Exam, Impression, and Recommendations:    COVID-19 This is a pleasant 45 year old female, COVID-19 vaccinated, she started having symptoms 2 days ago, tested positive yesterday. Mostly upper respiratory symptoms, sore throat, headaches, mild cough. She is speaking full sentences on the phone, because she is back to visit she will be a candidate for antibiotic infusion. We will do Decadron for 5 days, she is on Eliquis so we  have to avoid NSAIDs, she is also allergic to tramadol, oxycodone and hydrocodone so cannot use much Ozempic Tylenol to help control of pain, she was ordered gargles, she will stay home and isolate for 5 days, followed by precautions for 10 days. Keep close contact with [name], follow-up with PCP in about 10 days.   I discussed the above assessment and treatment plan with the patient. The patient was provided an opportunity to ask questions and all were answered. The patient agreed with the plan and demonstrated an understanding of the instructions.   The patient was advised to call back or seek an in-person evaluation if the symptoms worsen or if the condition fails to improve as anticipated.   I provided 30 minutes of face to face and non-face-to-face time during this encounter date, time was needed to gather information, review chart, records, communicate/coordinate with staff remotely, as well as complete documentation.   ___________________________________________ Gwen Her. Dianah Field, M.D., ABFM., CAQSM. Primary Care and [place] Instructor of [employer] [facility]

## 2021-01-18 NOTE — Assessment & Plan Note (Addendum)
This is a pleasant 45 year old female, COVID-19 vaccinated, she started having symptoms 2 days ago, tested positive yesterday. Mostly upper respiratory symptoms, sore throat, headaches, mild cough. She is speaking full sentences on the phone, because she is back to visit she will be a candidate for antibiotic infusion. We will do Decadron for 5 days, she is on Eliquis so we have to avoid NSAIDs, she is also allergic to tramadol, oxycodone and hydrocodone so cannot use much Ozempic Tylenol to help control of pain, she was ordered gargles, she will stay home and isolate for 5 days, followed by precautions for 10 days. Keep close contact with [name], follow-up with PCP in about 10 days.

## 2021-01-18 NOTE — Telephone Encounter (Signed)
Called to discuss with patient about COVID-19 symptoms and the use of one of the available treatments for those with mild to moderate Covid symptoms and at a high risk of hospitalization.  Pt appears to qualify for outpatient treatment due to co-morbid conditions and/or a member of an at-risk group in accordance with the FDA Emergency Use Authorization.    Symptom onset: Monday 4/11, c/o fatigue, congestion, headache, ear aches and sore throat Vaccinated: yes Booster: yes Immunocompromised: no Qualifiers: morbid obesity, premature ventricle contractions  Pt has recently had blood work. RN informed pt an APP will reach out to discuss possible treatment options.   Janine Ores, RN

## 2021-01-23 ENCOUNTER — Encounter: Payer: Self-pay | Admitting: Family Medicine

## 2021-01-23 DIAGNOSIS — F902 Attention-deficit hyperactivity disorder, combined type: Secondary | ICD-10-CM

## 2021-01-24 MED ORDER — AMPHETAMINE-DEXTROAMPHET ER 20 MG PO CP24: 20.0000 mg | ORAL_CAPSULE | Freq: Every morning | ORAL | 0 refills | Status: DC

## 2021-01-24 MED ORDER — AMPHETAMINE-DEXTROAMPHETAMINE 10 MG PO TABS: 10.0000 mg | ORAL_TABLET | Freq: Every day | ORAL | 0 refills | Status: DC

## 2021-01-24 NOTE — Telephone Encounter (Signed)
Meds sent

## 2021-01-27 ENCOUNTER — Other Ambulatory Visit: Payer: Self-pay

## 2021-01-27 ENCOUNTER — Inpatient Hospital Stay: Payer: 59 | Attending: Hematology & Oncology

## 2021-01-27 ENCOUNTER — Encounter: Payer: Self-pay | Admitting: Hematology & Oncology

## 2021-01-27 ENCOUNTER — Inpatient Hospital Stay (HOSPITAL_BASED_OUTPATIENT_CLINIC_OR_DEPARTMENT_OTHER): Payer: 59 | Admitting: Hematology & Oncology

## 2021-01-27 VITALS — BP 110/59 | HR 81 | Temp 98.2°F | Resp 19 | Wt 315.0 lb

## 2021-01-27 DIAGNOSIS — I2699 Other pulmonary embolism without acute cor pulmonale: Secondary | ICD-10-CM

## 2021-01-27 DIAGNOSIS — Z8616 Personal history of COVID-19: Secondary | ICD-10-CM | POA: Diagnosis not present

## 2021-01-27 DIAGNOSIS — Z7901 Long term (current) use of anticoagulants: Secondary | ICD-10-CM | POA: Diagnosis not present

## 2021-01-27 DIAGNOSIS — I2609 Other pulmonary embolism with acute cor pulmonale: Secondary | ICD-10-CM

## 2021-01-27 DIAGNOSIS — E282 Polycystic ovarian syndrome: Secondary | ICD-10-CM | POA: Insufficient documentation

## 2021-01-27 LAB — CMP (CANCER CENTER ONLY)
ALT: 37 U/L (ref 0–44)
AST: 16 U/L (ref 15–41)
Albumin: 3.5 g/dL (ref 3.5–5.0)
Alkaline Phosphatase: 68 U/L (ref 38–126)
Anion gap: 5 (ref 5–15)
BUN: 21 mg/dL — ABNORMAL HIGH (ref 6–20)
CO2: 31 mmol/L (ref 22–32)
Calcium: 8.6 mg/dL — ABNORMAL LOW (ref 8.9–10.3)
Chloride: 101 mmol/L (ref 98–111)
Creatinine: 0.85 mg/dL (ref 0.44–1.00)
GFR, Estimated: >60 mL/min (ref >60)
Glucose, Bld: 122 mg/dL — ABNORMAL HIGH (ref 70–99)
Potassium: 4.1 mmol/L (ref 3.5–5.1)
Sodium: 137 mmol/L (ref 135–145)
Total Bilirubin: 0.5 mg/dL (ref 0.3–1.2)
Total Protein: 6.3 g/dL — ABNORMAL LOW (ref 6.5–8.1)

## 2021-01-27 LAB — CBC WITH DIFFERENTIAL (CANCER CENTER ONLY)
Abs Immature Granulocytes: 0.72 10*3/uL — ABNORMAL HIGH (ref 0.00–0.07)
Basophils Absolute: 0.1 10*3/uL (ref 0.0–0.1)
Basophils Relative: 0 %
Eosinophils Absolute: 0.1 10*3/uL (ref 0.0–0.5)
Eosinophils Relative: 1 %
HCT: 42.6 % (ref 36.0–46.0)
Hemoglobin: 14.1 g/dL (ref 12.0–15.0)
Immature Granulocytes: 4 %
Lymphocytes Relative: 36 %
Lymphs Abs: 6.6 10*3/uL — ABNORMAL HIGH (ref 0.7–4.0)
MCH: 29.1 pg (ref 26.0–34.0)
MCHC: 33.1 g/dL (ref 30.0–36.0)
MCV: 88 fL (ref 80.0–100.0)
Monocytes Absolute: 1.3 10*3/uL — ABNORMAL HIGH (ref 0.1–1.0)
Monocytes Relative: 7 %
Neutro Abs: 9.7 10*3/uL — ABNORMAL HIGH (ref 1.7–7.7)
Neutrophils Relative %: 52 %
Platelet Count: 403 10*3/uL — ABNORMAL HIGH (ref 150–400)
RBC: 4.84 MIL/uL (ref 3.87–5.11)
RDW: 13 % (ref 11.5–15.5)
WBC Count: 18.4 10*3/uL — ABNORMAL HIGH (ref 4.0–10.5)
nRBC: 0 % (ref 0.0–0.2)

## 2021-01-27 NOTE — Progress Notes (Signed)
Hematology and Oncology Follow Up Visit  Shamiracle Wratten [id number] [birth year] 45 y.o. 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 twice daily-complete 1 year of therapy in September 2019  Eilquis 2.5 mg po BID - start 06/08/2018     Interim History:  Ms. Bedrick is back for follow-up.  We saw her 6 months ago.  She has a new job.  She enjoys this.  She still works at home quite a bit.  She is planning a relatively quiet summer.  I know that they will be going to [place] down to [place].  I did they are also going go to the [facility] part of [place] to [place].  Her children are doing quite well.  Her birthday is coming up this weekend.  She will be 45 years old.  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  She has she is going to do an interview today.  She has had no problems with bleeding.  Has been no pain with the blood thinner.  She has had no cough or shortness of breath.  She did have COVID over Easter.  Her whole family had COVID.  She was put on some steroids.  She is over this right now.  Sound like she may need bariatric surgery.  I think she has an appointment to see a bariatric surgeon.  Currently, her performance status is ECOG 1.    Medications:  Current Outpatient Medications:  .  acetaminophen (TYLENOL) 500 MG tablet, Take 500 mg by mouth every 6 (six) hours as needed., Disp: , Rfl:  .  albuterol (VENTOLIN HFA) 108 (90 Base) MCG/ACT inhaler, Inhale 2 puffs into the lungs every 6 (six) hours as needed for wheezing., Disp: 1 each, Rfl: 1 .  amphetamine-dextroamphetamine (ADDERALL XR) 20 MG 24 hr capsule, Take 1 capsule (20 mg total) by mouth in the morning., Disp: 30 capsule, Rfl: 0 .  amphetamine-dextroamphetamine (ADDERALL) 10 MG tablet, Take 1 tablet (10 mg total) by mouth daily after lunch., Disp: 30 tablet, Rfl: 0 .  cyclobenzaprine  (FLEXERIL) 10 MG tablet, Take 10 mg by mouth 3 (three) times daily as needed. , Disp: , Rfl:  .  dexamethasone (DECADRON) 4 MG tablet, Take 1 tablet (4 mg total) by mouth 3 (three) times daily., Disp: 15 tablet, Rfl: 0 .  DULoxetine (CYMBALTA) 60 MG capsule, Take 1 capsule (60 mg total) by mouth 2 (two) times daily. Take 2 capsules by mouth daily, Disp: 180 capsule, Rfl: 0 .  ELIQUIS 5 MG TABS tablet, TAKE ONE TABLET BY MOUTH TWICE A DAY, Disp: 180 tablet, Rfl: 3 .  EPINEPHrine 0.3 mg/0.3 mL IJ SOAJ injection, Inject 0.3 mg into the muscle once as needed (anaphylaxis)., Disp: 1 each, Rfl: 1 .  fluconazole (DIFLUCAN) 150 MG tablet, Take 150 mg by mouth every 3 (three) days., Disp: , Rfl:  .  LATUDA 20 MG TABS tablet, Take 20 mg by mouth daily., Disp: , Rfl:  .  Melatonin 1 MG CAPS, Take 1 capsule by mouth at bedtime., Disp: , Rfl:  .  Multiple Vitamin (MULTI-VITAMIN) tablet, Take by mouth., Disp: , Rfl:  .  triamcinolone ointment (KENALOG) 0.1 %, Apply 1 application topically 2 (two) times daily., Disp: , Rfl:  .  Vitamin D, Ergocalciferol, (DRISDOL) 1.25 MG (50000 UNIT) CAPS capsule, Take 1 capsule (50,000 Units total) by mouth every 7 (seven)  days., Disp: 4 capsule, Rfl: 0  Allergies:  Allergies  Allergen Reactions  . Cinnamon Shortness Of Breath and Rash    Wheezing   . Guaifenesin-Codeine Itching  . Morphine Itching, Rash and Hives    Other reaction(s): Other (See Comments) BEHAVIOR CHANGES Big mood changes BEHAVIOR CHANGES irritable  . Morphine And Related Other (See Comments), Hives and Rash    Big mood changes BEHAVIOR CHANGES   . Nutmeg Oil (Myristica Oil) Shortness Of Breath and Rash    wheezing  . Shellfish Allergy Hives, Swelling and Anaphylaxis    Throat closes  . Shrimp Extract Allergy Skin Test     Other reaction(s): Wheezing  . Codeine Hives and Itching    Pt says she can take percocet  . Clonazepam Itching  . Menthol Hives  . Robitussin A-C [Guaifenesin-Codeine]  Itching  . Tramadol Itching  . Yeast Hives  . Yeast-Related Products Hives    Past Medical History, Surgical history, Social history, and Family History were reviewed and updated.  Review of Systems: Review of Systems  Constitutional: Negative.   HENT:  Negative.   Eyes: Negative.   Respiratory: Negative.   Cardiovascular: Negative.   Gastrointestinal: Negative.   Endocrine: Negative.   Genitourinary: Negative.    Musculoskeletal: Negative.   Skin: Negative.   Neurological: Negative.   Hematological: Negative.   Psychiatric/Behavioral: Negative.     Physical Exam:  weight is 315 lb (142.9 kg) (abnormal). Her oral temperature is 98.2 F (36.8 C). Her blood pressure is 110/59 (abnormal) and her pulse is 81. Her respiration is 19 and oxygen saturation is 100%.   Wt Readings from Last 3 Encounters:  01/27/21 (!) 315 lb (142.9 kg)  12/22/20 (!) 311 lb 4.8 oz (141.2 kg)  09/26/20 297 lb (134.7 kg)    Physical Exam Vitals reviewed.  HENT:     Head: Normocephalic and atraumatic.  Eyes:     Pupils: Pupils are equal, round, and reactive to light.  Cardiovascular:     Rate and Rhythm: Normal rate and regular rhythm.     Heart sounds: Normal heart sounds.  Pulmonary:     Effort: Pulmonary effort is normal.     Breath sounds: Normal breath sounds.  Abdominal:     General: Bowel sounds are normal.     Palpations: Abdomen is soft.  Musculoskeletal:        General: No tenderness or deformity. Normal range of motion.     Cervical back: Normal range of motion.  Lymphadenopathy:     Cervical: No cervical adenopathy.  Skin:    General: Skin is warm and dry.     Findings: No erythema or rash.  Neurological:     Mental Status: She is alert and oriented to person, place, and time.  Psychiatric:        Behavior: Behavior normal.        Thought Content: Thought content normal.        Judgment: Judgment normal.      Lab Results  Component Value Date   WBC 18.4 (H)  01/27/2021   HGB 14.1 01/27/2021   HCT 42.6 01/27/2021   MCV 88.0 01/27/2021   PLT 403 (H) 01/27/2021     Chemistry      Component Value Date/Time   NA 138 12/22/2020 0930   NA 141 04/20/2020 1441   NA 140 10/11/2017 1122   K 4.5 12/22/2020 0930   K 4.2 10/11/2017 1122   CL 105 12/22/2020 0930  CL 103 10/11/2017 1122   CO2 28 12/22/2020 0930   CO2 29 10/11/2017 1122   BUN 12 12/22/2020 0930   BUN 14 04/20/2020 1441   BUN 10 10/11/2017 1122   CREATININE 0.71 12/22/2020 0930      Component Value Date/Time   CALCIUM 8.8 12/22/2020 0930   CALCIUM 8.7 10/11/2017 1122   ALKPHOS 81 06/16/2020 0831   ALKPHOS 83 10/11/2017 1122   AST 23 12/22/2020 0930   AST 19 06/16/2020 0831   ALT 23 12/22/2020 0930   ALT 20 06/16/2020 0831   ALT 36 10/11/2017 1122   BILITOT 0.4 12/22/2020 0930   BILITOT 0.4 06/16/2020 0831      Impression and Plan: Ms. Oaxaca is a 45 year-old white female.  She has polycystic ovaries.  She had a pulmonary embolism in September 2018.  This was idiopathic.  We will have her on Eliquis.  She likes to be on low-dose Eliquis.  She does travel for work.  She just feels more comfortable traveling.  If she does have bariatric surgery, I will see a problem with her having this.  I do not think she needs a filter for the surgery.  We will still plan to get her back in 6 more months.  She is incredibly active.  She has a wonderful family.  She is very involved with them.  It is certainly inspirational to see this.  We will see her back in 6 months.   Volanda Napoleon, MD 4/22/20229:20 AM

## 2021-01-30 ENCOUNTER — Telehealth: Payer: Self-pay

## 2021-01-30 NOTE — Telephone Encounter (Signed)
Called and left a vm with appts per 01/27/21 los    [employer]

## 2021-01-31 ENCOUNTER — Ambulatory Visit: Payer: 59 | Admitting: Family Medicine

## 2021-01-31 ENCOUNTER — Other Ambulatory Visit: Payer: Self-pay

## 2021-01-31 ENCOUNTER — Encounter: Payer: Self-pay | Admitting: Family Medicine

## 2021-01-31 VITALS — BP 117/49 | HR 93 | Ht 65.0 in | Wt 306.0 lb

## 2021-01-31 DIAGNOSIS — Z23 Encounter for immunization: Secondary | ICD-10-CM

## 2021-01-31 DIAGNOSIS — F902 Attention-deficit hyperactivity disorder, combined type: Secondary | ICD-10-CM

## 2021-01-31 DIAGNOSIS — Q383 Other congenital malformations of tongue: Secondary | ICD-10-CM

## 2021-01-31 DIAGNOSIS — E559 Vitamin D deficiency, unspecified: Secondary | ICD-10-CM

## 2021-01-31 DIAGNOSIS — R5383 Other fatigue: Secondary | ICD-10-CM | POA: Diagnosis not present

## 2021-01-31 DIAGNOSIS — M791 Myalgia, unspecified site: Secondary | ICD-10-CM | POA: Diagnosis not present

## 2021-01-31 DIAGNOSIS — Z8616 Personal history of COVID-19: Secondary | ICD-10-CM

## 2021-01-31 MED ORDER — VITAMIN D (ERGOCALCIFEROL) 1.25 MG (50000 UNIT) PO CAPS: 50000.0000 [IU] | ORAL_CAPSULE | ORAL | 1 refills | Status: DC

## 2021-01-31 MED ORDER — AMPHETAMINE-DEXTROAMPHET ER 20 MG PO CP24: 20.0000 mg | ORAL_CAPSULE | Freq: Every morning | ORAL | 0 refills | Status: DC

## 2021-01-31 MED ORDER — AMPHETAMINE-DEXTROAMPHET ER 20 MG PO CP24: 20.0000 mg | ORAL_CAPSULE | ORAL | 0 refills | Status: DC

## 2021-01-31 MED ORDER — AMPHETAMINE-DEXTROAMPHETAMINE 10 MG PO TABS: 10.0000 mg | ORAL_TABLET | Freq: Every day | ORAL | 0 refills | Status: DC

## 2021-01-31 NOTE — Assessment & Plan Note (Signed)
Unfortunately still having persistent fatigue we did discuss that this should gradually get better but it may take anywhere from weeks to months.  Still having a lot of soreness in her muscles and joints some got to go ahead and check a sed rate and a CRP as well.  The rash on her tongue should resolve as well and looks more like a geographic tongue.  I do not see any erythema to suggest thrush.

## 2021-01-31 NOTE — Progress Notes (Signed)
Established Patient Office Visit  Subjective:  Patient ID: Barbara Mcmahon, female    DOB: December 08, 1975  Age: 45 y.o. MRN: [id number]  CC:  Chief Complaint  Patient presents with  . Follow-up    HPI Barbara Mcmahon presents for    ADD - Reports symptoms are well controlled on current regime. Denies any problems with insomnia, chest pain, palpitations, or SOB.  She often skips the afternoon dose just uses it occasionally.  She will need refills on her medication for next month.  Since having COVID she has had some persistent symptoms.  Noticed a rash on her tongue.  She says it actually feels tender and painful at times she still been very fatigued.  She has a lot of soreness in her upper arms and chest area in her joints in general.  He also recently had a heart monitor for palpitations which would showed some frequent PVCs.  She says she feels like it is actually gotten a little better.  She is following up with her cardiologist in the next couple of months.  She wants to know if it would be okay to take the Adderall.  Vitamin D deficiency-she would like a refill on her vitamin D.  Past Medical History:  Diagnosis Date  . Abnormal Pap smear and cervical HPV (human papillomavirus)   . Anxiety   . Arthritis    BAck and sacral joint  . Back pain   . Colon polyps   . Constipation   . Gallbladder problem   . Generalized anxiety disorder   . IBS (irritable bowel syndrome)   . Infertility, female   . Joint pain   . Multiple food allergies    Shellfish, cinnamon and yeast  . Obesity   . PCOS (polycystic ovarian syndrome)   . Prediabetes   . Pulmonary embolism ([place])   . Seizure disorder ([place])   . Vitamin D deficiency     Past Surgical History:  Procedure Laterality Date  . BREAST BIOPSY Right 2019   benign  . CHOLECYSTECTOMY    . COLPOSCOPY    . DILATION AND CURETTAGE OF UTERUS    . GYNECOLOGIC CRYOSURGERY    . KNEE SURGERY  right    Family History  Problem  Relation Age of Onset  . Hypertension Father   . Depression Father   . Anxiety disorder Father   . Sleep apnea Father   . Obesity Father   . Diabetes Paternal Grandmother   . Hypertension Paternal Grandmother   . Stroke Paternal Grandmother   . Diabetes Paternal Grandfather   . Hypertension Paternal Grandfather   . Stroke Paternal Grandfather   . Hypertension Mother   . Depression Mother   . Anxiety disorder Mother   . Eating disorder Mother     Social History   Socioeconomic History  . Marital status: Married    Spouse name: Tommy Minichiello  . Number of children: 2  . Years of education: Not on file  . Highest education level: Not on file  Occupational History  . Occupation: Art gallery manager  Tobacco Use  . Smoking status: Never Smoker  . Smokeless tobacco: Never Used  Vaping Use  . Vaping Use: Never used  Substance and Sexual Activity  . Alcohol use: No  . Drug use: No  . Sexual activity: Not Currently  Other Topics Concern  . Not on file  Social History Narrative  . Not on file   Social Determinants of Health  Financial Resource Strain: Not on file  Food Insecurity: Not on file  Transportation Needs: Not on file  Physical Activity: Not on file  Stress: Not on file  Social Connections: Not on file  Intimate Partner Violence: Not on file    Outpatient Medications Prior to Visit  Medication Sig Dispense Refill  . acetaminophen (TYLENOL) 500 MG tablet Take 500 mg by mouth every 6 (six) hours as needed.    Marland Kitchen albuterol (VENTOLIN HFA) 108 (90 Base) MCG/ACT inhaler Inhale 2 puffs into the lungs every 6 (six) hours as needed for wheezing. 1 each 1  . cyclobenzaprine (FLEXERIL) 10 MG tablet Take 10 mg by mouth 3 (three) times daily as needed.     . DULoxetine (CYMBALTA) 60 MG capsule Take 1 capsule (60 mg total) by mouth 2 (two) times daily. Take 2 capsules by mouth daily 180 capsule 0  . ELIQUIS 5 MG TABS tablet TAKE ONE TABLET BY MOUTH TWICE A  DAY 180 tablet 3  . EPINEPHrine 0.3 mg/0.3 mL IJ SOAJ injection Inject 0.3 mg into the muscle once as needed (anaphylaxis). 1 each 1  . LATUDA 20 MG TABS tablet Take 20 mg by mouth daily.    . Melatonin 1 MG CAPS Take 1 capsule by mouth at bedtime.    . Multiple Vitamin (MULTI-VITAMIN) tablet Take by mouth.    . triamcinolone ointment (KENALOG) 0.1 % Apply 1 application topically 2 (two) times daily.    Marland Kitchen amphetamine-dextroamphetamine (ADDERALL XR) 20 MG 24 hr capsule Take 1 capsule (20 mg total) by mouth in the morning. 30 capsule 0  . amphetamine-dextroamphetamine (ADDERALL) 10 MG tablet Take 1 tablet (10 mg total) by mouth daily after lunch. 30 tablet 0  . Vitamin D, Ergocalciferol, (DRISDOL) 1.25 MG (50000 UNIT) CAPS capsule Take 1 capsule (50,000 Units total) by mouth every 7 (seven) days. 4 capsule 0  . dexamethasone (DECADRON) 4 MG tablet Take 1 tablet (4 mg total) by mouth 3 (three) times daily. 15 tablet 0  . fluconazole (DIFLUCAN) 150 MG tablet Take 150 mg by mouth every 3 (three) days.     No facility-administered medications prior to visit.    Allergies  Allergen Reactions  . Cinnamon Shortness Of Breath and Rash    Wheezing   . Guaifenesin-Codeine Itching  . Morphine Itching, Rash and Hives    Other reaction(s): Other (See Comments) BEHAVIOR CHANGES Big mood changes BEHAVIOR CHANGES irritable  . Morphine And Related Other (See Comments), Hives and Rash    Big mood changes BEHAVIOR CHANGES   . Nutmeg Oil (Myristica Oil) Shortness Of Breath and Rash    wheezing  . Shellfish Allergy Hives, Swelling and Anaphylaxis    Throat closes  . Shrimp Extract Allergy Skin Test     Other reaction(s): Wheezing  . Codeine Hives and Itching    Pt says she can take percocet  . Clonazepam Itching  . Menthol Hives  . Robitussin A-C [Guaifenesin-Codeine] Itching  . Tramadol Itching  . Yeast Hives  . Yeast-Related Products Hives    ROS Review of Systems    Objective:     Physical Exam Constitutional:      Appearance: She is well-developed.  HENT:     Head: Normocephalic and atraumatic.     Comments: Tongue has a pink and white appearance and most like a geographic tongue I do not see any erythema consistent with thrush. Cardiovascular:     Rate and Rhythm: Normal rate and regular rhythm.  Heart sounds: Normal heart sounds.  Pulmonary:     Effort: Pulmonary effort is normal.     Breath sounds: Normal breath sounds.  Skin:    General: Skin is warm and dry.  Neurological:     Mental Status: She is alert and oriented to person, place, and time.  Psychiatric:        Behavior: Behavior normal.     BP (!) 117/49   Pulse 93   Ht 5\' 5"  (1.651 m)   Wt (!) 306 lb (138.8 kg)   SpO2 100%   BMI 50.92 kg/m  Wt Readings from Last 3 Encounters:  01/31/21 (!) 306 lb (138.8 kg)  01/27/21 (!) 315 lb (142.9 kg)  12/22/20 (!) 311 lb 4.8 oz (141.2 kg)     There are no preventive care reminders to display for this patient.  There are no preventive care reminders to display for this patient.  Lab Results  Component Value Date   TSH 1.84 12/22/2020   Lab Results  Component Value Date   WBC 18.4 (H) 01/27/2021   HGB 14.1 01/27/2021   HCT 42.6 01/27/2021   MCV 88.0 01/27/2021   PLT 403 (H) 01/27/2021   Lab Results  Component Value Date   NA 137 01/27/2021   K 4.1 01/27/2021   CO2 31 01/27/2021   GLUCOSE 122 (H) 01/27/2021   BUN 21 (H) 01/27/2021   CREATININE 0.85 01/27/2021   BILITOT 0.5 01/27/2021   ALKPHOS 68 01/27/2021   AST 16 01/27/2021   ALT 37 01/27/2021   PROT 6.3 (L) 01/27/2021   ALBUMIN 3.5 01/27/2021   CALCIUM 8.6 (L) 01/27/2021   ANIONGAP 5 01/27/2021   Lab Results  Component Value Date   CHOL 193 04/20/2020   Lab Results  Component Value Date   HDL 48 04/20/2020   Lab Results  Component Value Date   LDLCALC 127 (H) 04/20/2020   Lab Results  Component Value Date   TRIG 98 04/20/2020   No results found for:  CHOLHDL Lab Results  Component Value Date   HGBA1C 5.8 (H) 04/20/2020      Assessment & Plan:   Problem List Items Addressed This Visit      Other   Vitamin D deficiency    Refilled her vitamin D today as she has been out of it for couple of months.  Last vitamin D level was still subtherapeutic.      Relevant Medications   Vitamin D, Ergocalciferol, (DRISDOL) 1.25 MG (50000 UNIT) CAPS capsule   History of COVID-19    Unfortunately still having persistent fatigue we did discuss that this should gradually get better but it may take anywhere from weeks to months.  Still having a lot of soreness in her muscles and joints some got to go ahead and check a sed rate and a CRP as well.  The rash on her tongue should resolve as well and looks more like a geographic tongue.  I do not see any erythema to suggest thrush.      Attention deficit hyperactivity disorder (ADHD), combined type   Relevant Medications   amphetamine-dextroamphetamine (ADDERALL) 10 MG tablet (Start on 02/22/2021)   amphetamine-dextroamphetamine (ADDERALL XR) 20 MG 24 hr capsule (Start on 02/20/2021)   amphetamine-dextroamphetamine (ADDERALL XR) 20 MG 24 hr capsule (Start on 03/24/2021)   amphetamine-dextroamphetamine (ADDERALL XR) 20 MG 24 hr capsule (Start on 02/22/2021)    Other Visit Diagnoses    Need for tetanus, diphtheria, and acellular pertussis (Tdap)  vaccine in patient of adolescent age or older    -  Primary   Relevant Orders   Tdap vaccine greater than or equal to 7yo IM (Completed)   Tongue abnormality       Other fatigue       Myalgia         Tongue abnormality-should resolve in the next month or 2 if it continues to be sore or tender we can always check for deficiencies.  Tdap updated today.  Meds ordered this encounter  Medications  . Vitamin D, Ergocalciferol, (DRISDOL) 1.25 MG (50000 UNIT) CAPS capsule    Sig: Take 1 capsule (50,000 Units total) by mouth every 7 (seven) days.    Dispense:  12  capsule    Refill:  1  . amphetamine-dextroamphetamine (ADDERALL) 10 MG tablet    Sig: Take 1 tablet (10 mg total) by mouth daily after lunch.    Dispense:  30 tablet    Refill:  0  . amphetamine-dextroamphetamine (ADDERALL XR) 20 MG 24 hr capsule    Sig: Take 1 capsule (20 mg total) by mouth in the morning.    Dispense:  30 capsule    Refill:  0  . amphetamine-dextroamphetamine (ADDERALL XR) 20 MG 24 hr capsule    Sig: Take 1 capsule (20 mg total) by mouth every morning.    Dispense:  30 capsule    Refill:  0  . amphetamine-dextroamphetamine (ADDERALL XR) 20 MG 24 hr capsule    Sig: Take 1 capsule (20 mg total) by mouth every morning.    Dispense:  30 capsule    Refill:  0    Follow-up: Return in about 6 months (around 08/02/2021) for ADD medications .    Beatrice Lecher, MD

## 2021-01-31 NOTE — Assessment & Plan Note (Signed)
Refilled her vitamin D today as she has been out of it for couple of months.  Last vitamin D level was still subtherapeutic.

## 2021-01-31 NOTE — Progress Notes (Signed)
Pt recently had COVID and is asking that her lungs be checked and she also c/o a rash on her tongue that has been there x 4 days, she has had low energy and body aches. She feels that it may also be attributed to arthritis flare.

## 2021-02-06 ENCOUNTER — Encounter: Payer: Self-pay | Admitting: Family Medicine

## 2021-02-06 DIAGNOSIS — M791 Myalgia, unspecified site: Secondary | ICD-10-CM

## 2021-02-06 DIAGNOSIS — R5383 Other fatigue: Secondary | ICD-10-CM

## 2021-02-06 DIAGNOSIS — M2550 Pain in unspecified joint: Secondary | ICD-10-CM

## 2021-02-07 ENCOUNTER — Encounter: Payer: Self-pay | Admitting: Family Medicine

## 2021-02-07 NOTE — Telephone Encounter (Signed)
Will need to get labs first. Rheum will require that is notes we send.

## 2021-02-09 ENCOUNTER — Other Ambulatory Visit: Payer: Self-pay | Admitting: General Surgery

## 2021-02-09 ENCOUNTER — Other Ambulatory Visit (HOSPITAL_COMMUNITY): Payer: Self-pay | Admitting: General Surgery

## 2021-02-14 LAB — C-REACTIVE PROTEIN: CRP: 15.9 mg/L — ABNORMAL HIGH (ref <8.0)

## 2021-02-14 LAB — CBC
HCT: 37.4 % (ref 35.0–45.0)
Hemoglobin: 12.5 g/dL (ref 11.7–15.5)
MCH: 29.4 pg (ref 27.0–33.0)
MCHC: 33.4 g/dL (ref 32.0–36.0)
MCV: 88 fL (ref 80.0–100.0)
MPV: 10.1 fL (ref 7.5–12.5)
Platelets: 313 10*3/uL (ref 140–400)
RBC: 4.25 10*6/uL (ref 3.80–5.10)
RDW: 13.2 % (ref 11.0–15.0)
WBC: 7.2 10*3/uL (ref 3.8–10.8)

## 2021-02-14 LAB — CYCLIC CITRUL PEPTIDE ANTIBODY, IGG: Cyclic Citrullin Peptide Ab: <16 UNITS

## 2021-02-14 LAB — URIC ACID: Uric Acid, Serum: 4.2 mg/dL (ref 2.5–7.0)

## 2021-02-14 LAB — RHEUMATOID FACTOR: Rheumatoid fact SerPl-aCnc: <14 IU/mL (ref <14)

## 2021-02-14 LAB — CK: Total CK: 50 U/L (ref 29–143)

## 2021-02-14 LAB — ANA: Anti Nuclear Antibody (ANA): NEGATIVE

## 2021-02-14 LAB — SEDIMENTATION RATE: Sed Rate: 36 mm/h — ABNORMAL HIGH (ref 0–20)

## 2021-02-23 ENCOUNTER — Ambulatory Visit (HOSPITAL_BASED_OUTPATIENT_CLINIC_OR_DEPARTMENT_OTHER)
Admission: RE | Admit: 2021-02-23 | Discharge: 2021-02-23 | Disposition: A | Discharge status: Home / Self Care | Payer: 59 | Source: Ambulatory Visit | Attending: General Surgery | Admitting: General Surgery

## 2021-02-23 ENCOUNTER — Ambulatory Visit (HOSPITAL_COMMUNITY)
Admission: RE | Admit: 2021-02-23 | Discharge: 2021-02-23 | Disposition: A | Discharge status: Home / Self Care | Payer: 59 | Source: Ambulatory Visit | Attending: General Surgery | Admitting: General Surgery

## 2021-02-23 ENCOUNTER — Other Ambulatory Visit: Payer: Self-pay

## 2021-02-23 DIAGNOSIS — R0602 Shortness of breath: Secondary | ICD-10-CM | POA: Insufficient documentation

## 2021-02-23 DIAGNOSIS — R071 Chest pain on breathing: Secondary | ICD-10-CM | POA: Diagnosis present

## 2021-02-24 ENCOUNTER — Encounter: Payer: Self-pay | Admitting: Medical-Surgical

## 2021-02-24 ENCOUNTER — Ambulatory Visit (HOSPITAL_BASED_OUTPATIENT_CLINIC_OR_DEPARTMENT_OTHER)
Admission: RE | Admit: 2021-02-24 | Discharge: 2021-02-24 | Disposition: A | Discharge status: Home / Self Care | Payer: 59 | Source: Ambulatory Visit | Attending: Medical-Surgical | Admitting: Medical-Surgical

## 2021-02-24 ENCOUNTER — Encounter: Payer: Self-pay | Admitting: Family Medicine

## 2021-02-24 DIAGNOSIS — R0602 Shortness of breath: Secondary | ICD-10-CM

## 2021-02-24 DIAGNOSIS — R071 Chest pain on breathing: Secondary | ICD-10-CM

## 2021-02-24 LAB — ECHOCARDIOGRAM COMPLETE
AR max vel: 2.16 cm2
AV Area VTI: 2.32 cm2
AV Area mean vel: 2.3 cm2
AV Mean grad: 5.5 mmHg
AV Peak grad: 10 mmHg
Ao pk vel: 1.59 m/s
Area-P 1/2: 4.31 cm2
S' Lateral: 2.68 cm

## 2021-02-24 MED ORDER — PERFLUTREN LIPID MICROSPHERE
1.0000 mL | INTRAVENOUS | Status: AC | PRN
  Administered 2021-02-24: 2 mL via INTRAVENOUS

## 2021-03-22 ENCOUNTER — Telehealth: Payer: Self-pay | Admitting: Family Medicine

## 2021-03-22 NOTE — Telephone Encounter (Signed)
Pls call [facility] for last pap report.  Need to see if had HPV testing done or no

## 2021-03-24 NOTE — Telephone Encounter (Signed)
Release faxed, note written on release asking about HPV testing

## 2021-03-28 ENCOUNTER — Encounter: Payer: 59 | Attending: General Surgery | Admitting: Skilled Nursing Facility1

## 2021-03-28 ENCOUNTER — Encounter: Payer: Self-pay | Admitting: Skilled Nursing Facility1

## 2021-03-28 ENCOUNTER — Other Ambulatory Visit: Payer: Self-pay

## 2021-03-28 DIAGNOSIS — Z6841 Body Mass Index (BMI) 40.0 and over, adult: Secondary | ICD-10-CM | POA: Insufficient documentation

## 2021-03-28 NOTE — Progress Notes (Signed)
Nutrition Assessment for Bariatric Surgery Medical Nutrition Therapy Appt Start Time: 7:33   End Time: 8:30  Patient was seen on 03/28/2021 for Pre-Operative Nutrition Assessment. Letter of approval faxed to [facility] bariatric surgery program coordinator on 03/28/2021.   Referral stated Supervised Weight Loss (SWL) visits needed: 0  Pt completed visits.   Pt has cleared nutrition requirements.   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 Vitamin D Allergies: nutmeg oil, shellfish, shrimp, yeast, Cinnamon  Micronutrient Nutrition Focused Physical Exam: Hair: No issues observed Eyes: No issues observed Mouth: No issues observed Neck: No issues observed Nails: No issues observed Skin: No issues observed  Lifestyle & Dietary Hx  Pt states she wants to eat healthy and be active and feel well.    24-Hr Dietary Recall First Meal: poptart and 2 cheese sticks and greek yogurt Snack:  Second Meal: frozen meal + yogurt + chocolate Snack: Third Meal: cheese tortellini and salad Snack:  Beverages: alcoholic seltzer, vitamin water zero, diet soda, water   Estimated Energy Needs Calories: 1500   NUTRITION DIAGNOSIS  Overweight/obesity ([place]-3.3) related to past poor dietary habits and physical inactivity as evidenced by patient w/ planned sleeve surgery following dietary guidelines for continued weight loss.    NUTRITION INTERVENTION  Nutrition counseling (C-1) and education (E-2) to facilitate bariatric surgery goals.  Educated pt on  micronutrient deficiencies post surgery and strategies to mitigate that risk   Pre-Op Goals Reviewed with the Patient Track food and beverage intake (pen and paper, MyFitness Pal, Baritastic app, etc.) Make healthy food choices while monitoring portion sizes Consume 3 meals per day or try to eat every 3-5 hours Avoid concentrated sugars and fried foods Keep sugar & fat in the single digits per serving on food labels Practice CHEWING your food (aim for applesauce consistency) Practice not drinking 15 minutes before, during, and 30 minutes after each meal and snack Avoid all carbonated beverages (ex: soda, sparkling beverages)  Limit caffeinated beverages (ex: coffee, tea, energy drinks) Avoid all sugar-sweetened beverages (ex: regular soda, sports drinks)  Avoid alcohol  Aim for 64-100 ounces of FLUID daily (with at least half of fluid intake being plain water)  Aim for at least 60-80 grams of PROTEIN daily Look for a liquid protein source that contains ?15 g protein and ?5 g carbohydrate (ex: shakes, drinks, shots) Make a list of non-food related activities Physical activity is an important part of a healthy lifestyle so keep it moving! The goal is to reach 150 minutes of exercise per week, including cardiovascular and weight baring activity.  *Goals that are bolded indicate the pt would like to start working towards these  Handouts Provided Include  Bariatric Surgery handouts (Nutrition Visits, Pre-Op Goals, Protein Shakes, Vitamins & Minerals)  Learning Style & Readiness for Change Teaching method utilized: Visual & Auditory  Demonstrated degree of understanding via: Teach Back  Readiness Level: Action Barriers to learning/adherence to lifestyle change: none identified   MONITORING & EVALUATION Dietary intake, weekly physical activity, body weight, and pre-op goals reached at next nutrition visit.    Next  Steps  Patient is to follow up at [facility] for Pre-Op Class >2 weeks before  surgery for further nutrition education.   Pt has completed visits. No further supervised visits required/recomended

## 2021-04-19 ENCOUNTER — Other Ambulatory Visit: Payer: Self-pay | Admitting: Family Medicine

## 2021-04-19 DIAGNOSIS — F902 Attention-deficit hyperactivity disorder, combined type: Secondary | ICD-10-CM

## 2021-04-20 ENCOUNTER — Other Ambulatory Visit: Payer: Self-pay | Admitting: *Deleted

## 2021-04-20 MED ORDER — AMPHETAMINE-DEXTROAMPHET ER 20 MG PO CP24: 20.0000 mg | ORAL_CAPSULE | Freq: Every morning | ORAL | 0 refills | Status: DC

## 2021-04-20 MED ORDER — DULOXETINE HCL 60 MG PO CPEP: 60.0000 mg | ORAL_CAPSULE | Freq: Two times a day (BID) | ORAL | 0 refills | Status: DC

## 2021-04-20 MED ORDER — DULOXETINE HCL 60 MG PO CPEP: 120.0000 mg | ORAL_CAPSULE | Freq: Every day | ORAL | 1 refills | Status: DC

## 2021-04-21 ENCOUNTER — Ambulatory Visit (INDEPENDENT_AMBULATORY_CARE_PROVIDER_SITE_OTHER): Payer: 59 | Admitting: Family Medicine

## 2021-04-21 ENCOUNTER — Encounter: Payer: Self-pay | Admitting: Family Medicine

## 2021-04-21 ENCOUNTER — Other Ambulatory Visit: Payer: Self-pay

## 2021-04-21 VITALS — BP 137/67 | HR 96 | Ht 65.0 in | Wt 311.0 lb

## 2021-04-21 DIAGNOSIS — G4769 Other sleep related movement disorders: Secondary | ICD-10-CM | POA: Diagnosis not present

## 2021-04-21 DIAGNOSIS — Z8616 Personal history of COVID-19: Secondary | ICD-10-CM | POA: Diagnosis not present

## 2021-04-21 DIAGNOSIS — H9202 Otalgia, left ear: Secondary | ICD-10-CM

## 2021-04-21 DIAGNOSIS — R5383 Other fatigue: Secondary | ICD-10-CM | POA: Diagnosis not present

## 2021-04-21 NOTE — Patient Instructions (Addendum)
Continue using the Nasacort 2 sprays each nostril for 2 weeks. If your symptoms are not improving by Tuesday or Wednesday call the office. Consider starting Prednisone

## 2021-04-21 NOTE — Progress Notes (Signed)
Established Patient Office Visit  Subjective:  Patient ID: Barbara Mcmahon, female    DOB: 14-Aug-1976  Age: 45 y.o. MRN: [id number]  CC:  Chief Complaint  Patient presents with   Ear Pain    HPI [name] presents for left ear pain on and off for months.  She says it can be painful at times and almost feels sensitive to loud noise.  Sometimes she feels like a little wet moisture from the ear but no abnormal odor etc.  No hearing loss.  No popping or cracking.  She does take Zyrtec daily for her chronic allergies and did start Nasacort about 2 days ago.  She does have chronic nasal congestion from her allergies but does not feel like it is unusual.  He also reports that she is still feeling extremely fatigued from her COVID that she had about 12 weeks ago we actually did blood work in April just to rule out some other underlying issues that could be contributing to the fatigue and everything came back relatively normal.  She says that her husband has also noticed some jerking of the limbs particularly at night she says it happens pretty much every single night she denies excessive snoring or gasping for breath in the middle the night.  Her surgery is coming up in August with Dr. [name] over at [facility] surgery.  Past Medical History:  Diagnosis Date   Abnormal Pap smear and cervical HPV (human papillomavirus)    Anxiety    Arthritis    BAck and sacral joint   Back pain    Colon polyps    Constipation    Gallbladder problem    Generalized anxiety disorder    IBS (irritable bowel syndrome)    Infertility, female    Joint pain    Multiple food allergies    Shellfish, cinnamon and yeast   Obesity    PCOS (polycystic ovarian syndrome)    Prediabetes    Pulmonary embolism (HCC)    Seizure disorder (HCC)    Vitamin D deficiency     Past Surgical History:  Procedure Laterality Date   BREAST BIOPSY Right 2019   benign   CHOLECYSTECTOMY     COLPOSCOPY      DILATION AND CURETTAGE OF UTERUS     GYNECOLOGIC CRYOSURGERY     KNEE SURGERY  right    Family History  Problem Relation Age of Onset   Hypertension Father    Depression Father    Anxiety disorder Father    Sleep apnea Father    Obesity Father    Rheum arthritis Father    Diabetes Paternal Grandmother    Hypertension Paternal Grandmother    Stroke Paternal Grandmother    Diabetes Paternal Grandfather    Hypertension Paternal Grandfather    Stroke Paternal Grandfather    Hypertension Mother    Depression Mother    Anxiety disorder Mother    Eating disorder Mother    Rheum arthritis Mother     Social History   Socioeconomic History   Marital status: Married    Spouse name: [name]   Number of children: 2   Years of education: Not on file   Highest education level: Not on file  Occupational History   Occupation: Art gallery manager  Tobacco Use   Smoking status: Never   Smokeless tobacco: Never  Vaping Use   Vaping Use: Never used  Substance and Sexual Activity   Alcohol use: No  Drug use: No   Sexual activity: Not Currently  Other Topics Concern   Not on file  Social History Narrative   Not on file   Social Determinants of Health   Financial Resource Strain: Not on file  Food Insecurity: Not on file  Transportation Needs: Not on file  Physical Activity: Not on file  Stress: Not on file  Social Connections: Not on file  Intimate Partner Violence: Not on file    Outpatient Medications Prior to Visit  Medication Sig Dispense Refill   acetaminophen (TYLENOL) 500 MG tablet Take 500 mg by mouth every 6 (six) hours as needed.     albuterol (VENTOLIN HFA) 108 (90 Base) MCG/ACT inhaler Inhale 2 puffs into the lungs every 6 (six) hours as needed for wheezing. 1 each 1   amphetamine-dextroamphetamine (ADDERALL XR) 20 MG 24 hr capsule Take 1 capsule (20 mg total) by mouth every morning. 30 capsule 0   amphetamine-dextroamphetamine (ADDERALL  XR) 20 MG 24 hr capsule Take 1 capsule (20 mg total) by mouth every morning. 30 capsule 0   amphetamine-dextroamphetamine (ADDERALL XR) 20 MG 24 hr capsule Take 1 capsule (20 mg total) by mouth in the morning. 30 capsule 0   amphetamine-dextroamphetamine (ADDERALL) 10 MG tablet Take 1 tablet (10 mg total) by mouth daily after lunch. 30 tablet 0   DULoxetine (CYMBALTA) 60 MG capsule Take 2 capsules (120 mg total) by mouth daily. 180 capsule 1   ELIQUIS 5 MG TABS tablet TAKE ONE TABLET BY MOUTH TWICE A DAY 180 tablet 3   EPINEPHrine 0.3 mg/0.3 mL IJ SOAJ injection Inject 0.3 mg into the muscle once as needed (anaphylaxis). 1 each 1   Melatonin 1 MG CAPS Take 1 capsule by mouth at bedtime.     Multiple Vitamin (MULTI-VITAMIN) tablet Take by mouth.     triamcinolone ointment (KENALOG) 0.1 % Apply 1 application topically 2 (two) times daily.     Vitamin D, Ergocalciferol, (DRISDOL) 1.25 MG (50000 UNIT) CAPS capsule Take 1 capsule (50,000 Units total) by mouth every 7 (seven) days. 12 capsule 1   LATUDA 20 MG TABS tablet Take 20 mg by mouth daily. (Patient not taking: Reported on 04/21/2021)     cyclobenzaprine (FLEXERIL) 10 MG tablet Take 10 mg by mouth 3 (three) times daily as needed.      DULoxetine (CYMBALTA) 60 MG capsule Take 1 capsule (60 mg total) by mouth 2 (two) times daily. Take 2 capsules by mouth daily 180 capsule 0   No facility-administered medications prior to visit.    Allergies  Allergen Reactions   Cinnamon Shortness Of Breath and Rash    Wheezing    Guaifenesin-Codeine Itching   Morphine Itching, Rash and Hives    Other reaction(s): Other (See Comments) BEHAVIOR CHANGES Big mood changes BEHAVIOR CHANGES irritable   Morphine And Related Other (See Comments), Hives and Rash    Big mood changes BEHAVIOR CHANGES    Nutmeg Oil (Myristica Oil) Shortness Of Breath and Rash    wheezing   Shellfish Allergy Hives, Swelling and Anaphylaxis    Throat closes   Shrimp Extract  Allergy Skin Test     Other reaction(s): Wheezing   Codeine Hives and Itching    Pt says she can take percocet   Clonazepam Itching   Menthol Hives   Robitussin A-C [Guaifenesin-Codeine] Itching   Tramadol Itching   Yeast Hives   Yeast-Related Products Hives    ROS Review of Systems    Objective:  Physical Exam Vitals reviewed.  Constitutional:      Appearance: She is well-developed.  HENT:     Head: Normocephalic and atraumatic.     Comments: Bilateral TM and canals are clear.    Right Ear: Tympanic membrane, ear canal and external ear normal.     Left Ear: Tympanic membrane, ear canal and external ear normal.  Eyes:     Conjunctiva/sclera: Conjunctivae normal.  Cardiovascular:     Rate and Rhythm: Normal rate.  Pulmonary:     Effort: Pulmonary effort is normal.  Skin:    General: Skin is dry.     Coloration: Skin is not pale.  Neurological:     Mental Status: She is alert and oriented to person, place, and time.  Psychiatric:        Behavior: Behavior normal.    BP 137/67   Pulse 96   Ht 5\' 5"  (1.651 m)   Wt (!) 311 lb (141.1 kg)   SpO2 99%   BMI 51.75 kg/m  Wt Readings from Last 3 Encounters:  04/21/21 (!) 311 lb (141.1 kg)  03/28/21 (!) 311 lb 11.2 oz (141.4 kg)  01/31/21 (!) 306 lb (138.8 kg)     There are no preventive care reminders to display for this patient.  There are no preventive care reminders to display for this patient.  Lab Results  Component Value Date   TSH 1.84 12/22/2020   Lab Results  Component Value Date   WBC 7.2 02/13/2021   HGB 12.5 02/13/2021   HCT 37.4 02/13/2021   MCV 88.0 02/13/2021   PLT 313 02/13/2021   Lab Results  Component Value Date   NA 137 01/27/2021   K 4.1 01/27/2021   CO2 31 01/27/2021   GLUCOSE 122 (H) 01/27/2021   BUN 21 (H) 01/27/2021   CREATININE 0.85 01/27/2021   BILITOT 0.5 01/27/2021   ALKPHOS 68 01/27/2021   AST 16 01/27/2021   ALT 37 01/27/2021   PROT 6.3 (L) 01/27/2021   ALBUMIN  3.5 01/27/2021   CALCIUM 8.6 (L) 01/27/2021   ANIONGAP 5 01/27/2021   Lab Results  Component Value Date   CHOL 193 04/20/2020   Lab Results  Component Value Date   HDL 48 04/20/2020   Lab Results  Component Value Date   LDLCALC 127 (H) 04/20/2020   Lab Results  Component Value Date   TRIG 98 04/20/2020   No results found for: CHOLHDL Lab Results  Component Value Date   HGBA1C 5.8 (H) 04/20/2020      Assessment & Plan:   Problem List Items Addressed This Visit       Other   Other fatigue   History of COVID-19   Other Visit Diagnoses     Left ear pain    -  Primary   Sleep-related movement disorder       Relevant Orders   Ambulatory referral to Sleep Studies      Left ear pain-unclear etiology exam is actually pretty normal.  We will do tympanometry to see if there may be some pressure abnormality.  Tympanometry was normal today.  So we discussed continuing with the Zyrtec as well as the Nasacort which she just started 2 days ago would like for her to give it a try for at least 2 weeks to see if it is helpful but if after an additional 4 5 days she is not getting some improvement then consider a prednisone taper.  Jerking in her sleep consider  periodic limb movement.  Will refer for formal sleep study.   No orders of the defined types were placed in this encounter.   Follow-up: Return if symptoms worsen or fail to improve.    Beatrice Lecher, MD

## 2021-05-01 NOTE — Progress Notes (Signed)
Please enter orders for surgery scheduled for 05/22/21.

## 2021-05-08 ENCOUNTER — Encounter: Payer: 59 | Attending: General Surgery | Admitting: Skilled Nursing Facility1

## 2021-05-08 ENCOUNTER — Other Ambulatory Visit: Payer: Self-pay

## 2021-05-08 DIAGNOSIS — E66813 Obesity, class 3: Secondary | ICD-10-CM

## 2021-05-08 DIAGNOSIS — Z6841 Body Mass Index (BMI) 40.0 and over, adult: Secondary | ICD-10-CM | POA: Diagnosis present

## 2021-05-08 NOTE — Progress Notes (Signed)
Pre-Operative Nutrition Class:    Patient was seen on 05/08/2021 for Pre-Operative Bariatric Surgery Education at [facility].    Surgery date: 05/22/2021 Surgery type: sleeve Start weight at [facility]: 311.2 Weight today: 314  Samples given per MNT protocol. Patient educated on appropriate usage: Protien 20 shake Lot: [id number] Exp: 02/17/22   Bariatric Advantage Calcium Lot # [id number] Exp: 02/23     procare Vitamins Calcium Lot # [id number] Exp: 01/23   Celebrate Protein Powder   Lot # [id number] Exp: 04/23  The following the learning objectives were met by the patient during this course: Identify Pre-Op Dietary Goals and will begin 2 weeks pre-operatively Identify appropriate sources of fluids and proteins  State protein recommendations and appropriate sources pre and post-operatively Identify Post-Operative Dietary Goals and will follow for 2 weeks post-operatively Identify appropriate multivitamin and calcium sources Describe the need for physical activity post-operatively and will follow MD recommendations State when to call healthcare provider regarding medication questions or post-operative complications When having a diagnosis of diabetes understanding hypoglycemia symptoms and the inclusion of 1 complex carbohydrate per meal  Handouts given during class include: Pre-Op Bariatric Surgery Diet Handout Protein Shake Handout Post-Op Bariatric Surgery Nutrition Handout BELT Program Information Flyer Support Group Information Flyer [facility] Bariatric Supplements Price List  Follow-Up Plan: Patient will follow-up at [facility] 2 weeks post operatively for diet advancement per MD.

## 2021-05-09 NOTE — Progress Notes (Signed)
Sent message, via epic in basket, requesting orders in epic from surgeon.  

## 2021-05-11 ENCOUNTER — Ambulatory Visit: Payer: Self-pay | Admitting: General Surgery

## 2021-05-12 ENCOUNTER — Encounter (HOSPITAL_COMMUNITY): Payer: Self-pay

## 2021-05-12 ENCOUNTER — Other Ambulatory Visit: Payer: Self-pay

## 2021-05-12 ENCOUNTER — Telehealth: Payer: Self-pay | Admitting: *Deleted

## 2021-05-12 ENCOUNTER — Encounter (HOSPITAL_COMMUNITY)
Admission: RE | Admit: 2021-05-12 | Discharge: 2021-05-12 | Disposition: A | Discharge status: Home / Self Care | Payer: 59 | Source: Ambulatory Visit | Attending: General Surgery | Admitting: General Surgery

## 2021-05-12 DIAGNOSIS — Z01812 Encounter for preprocedural laboratory examination: Secondary | ICD-10-CM | POA: Insufficient documentation

## 2021-05-12 HISTORY — DX: Gastro-esophageal reflux disease without esophagitis: K21.9

## 2021-05-12 HISTORY — DX: Other specified postprocedural states: R11.2

## 2021-05-12 HISTORY — DX: Nausea with vomiting, unspecified: R11.2

## 2021-05-12 HISTORY — DX: Other specified postprocedural states: Z98.890

## 2021-05-12 HISTORY — DX: Pneumonia, unspecified organism: J18.9

## 2021-05-12 LAB — COMPREHENSIVE METABOLIC PANEL
ALT: 38 U/L (ref 0–44)
AST: 34 U/L (ref 15–41)
Albumin: 4.6 g/dL (ref 3.5–5.0)
Alkaline Phosphatase: 82 U/L (ref 38–126)
Anion gap: 8 (ref 5–15)
BUN: 22 mg/dL — ABNORMAL HIGH (ref 6–20)
CO2: 24 mmol/L (ref 22–32)
Calcium: 9.5 mg/dL (ref 8.9–10.3)
Chloride: 107 mmol/L (ref 98–111)
Creatinine, Ser: 0.73 mg/dL (ref 0.44–1.00)
GFR, Estimated: >60 mL/min (ref >60)
Glucose, Bld: 115 mg/dL — ABNORMAL HIGH (ref 70–99)
Potassium: 3.9 mmol/L (ref 3.5–5.1)
Sodium: 139 mmol/L (ref 135–145)
Total Bilirubin: 0.6 mg/dL (ref 0.3–1.2)
Total Protein: 7.6 g/dL (ref 6.5–8.1)

## 2021-05-12 LAB — CBC WITH DIFFERENTIAL/PLATELET
Abs Immature Granulocytes: 0.02 10*3/uL (ref 0.00–0.07)
Basophils Absolute: 0.1 10*3/uL (ref 0.0–0.1)
Basophils Relative: 1 %
Eosinophils Absolute: 0.1 10*3/uL (ref 0.0–0.5)
Eosinophils Relative: 1 %
HCT: 40.9 % (ref 36.0–46.0)
Hemoglobin: 13.6 g/dL (ref 12.0–15.0)
Immature Granulocytes: 0 %
Lymphocytes Relative: 26 %
Lymphs Abs: 2.8 10*3/uL (ref 0.7–4.0)
MCH: 29.8 pg (ref 26.0–34.0)
MCHC: 33.3 g/dL (ref 30.0–36.0)
MCV: 89.5 fL (ref 80.0–100.0)
Monocytes Absolute: 0.8 10*3/uL (ref 0.1–1.0)
Monocytes Relative: 8 %
Neutro Abs: 6.8 10*3/uL (ref 1.7–7.7)
Neutrophils Relative %: 64 %
Platelets: 332 10*3/uL (ref 150–400)
RBC: 4.57 MIL/uL (ref 3.87–5.11)
RDW: 12.9 % (ref 11.5–15.5)
WBC: 10.6 10*3/uL — ABNORMAL HIGH (ref 4.0–10.5)
nRBC: 0 % (ref 0.0–0.2)

## 2021-05-12 NOTE — Telephone Encounter (Signed)
Received call from [facility] from [place] inquiring about instructions with holding Eliquis prior to bariatric surgery on 8.15.22.  Per Dr Marin Olp, hold Eliquis 2 days prior to procedure and restart the day after surgery.  Left message with another nurse.

## 2021-05-12 NOTE — Progress Notes (Signed)
DUE TO COVID-19 ONLY ONE VISITOR IS ALLOWED TO COME WITH YOU AND STAY IN THE WAITING ROOM ONLY DURING PRE OP AND PROCEDURE DAY OF SURGERY.  2 VISITOR  MAY VISIT WITH YOU AFTER SURGERY IN YOUR PRIVATE ROOM DURING VISITING HOURS ONLY!  YOU NEED TO HAVE A COVID 19 TEST ON__8/08/2021 ____'@_'$  '@_from'$  8am-3pm _____, THIS TEST MUST BE DONE BEFORE SURGERY,  Covid test is done at [employer], [place] [address].  This is a drive thru.  No appt required. Please see map.                 Your procedure is scheduled on:      05/22/2021   Report to [facility] Main  Entrance   Report to admitting at   [place]   AM     Call this number if you have problems the morning of surgery [phone]    REMEMBER: NO  SOLID FOOD CANDY OR GUM AFTER MIDNIGHT. CLEAR LIQUIDS UNTIL   0430am         . NOTHING BY MOUTH EXCEPT CLEAR LIQUIDS UNTIL   0430am  . PLEASE FINISH ENSURE DRINK PER SURGEON ORDER  WHICH NEEDS TO BE COMPLETED AT     0430am  .      CLEAR LIQUID DIET   Foods Allowed                                                                    Coffee and tea, regular and decaf                            Fruit ices (not with fruit pulp)                                      Iced Popsicles                                    Carbonated beverages, regular and diet                                    Cranberry, grape and apple juices Sports drinks like Gatorade Lightly seasoned clear broth or consume(fat free) Sugar, honey syrup ___________________________________________________________________      BRUSH YOUR TEETH MORNING OF SURGERY AND RINSE YOUR MOUTH OUT, NO CHEWING GUM CANDY OR MINTS.     Take these medicines the morning of surgery with A SIP OF WATER:  cymbalta  DO NOT TAKE ANY DIABETIC MEDICATIONS DAY OF YOUR SURGERY                               You may not have any metal on your body including hair pins and              piercings  Do not wear jewelry, make-up, lotions, powders  or perfumes, deodorant  Do not wear nail polish on your fingernails.  Do not shave  48 hours prior to surgery.              Men may shave face and neck.   Do not bring valuables to the hospital. [place].  Contacts, dentures or bridgework may not be worn into surgery.  Leave suitcase in the car. After surgery it may be brought to your room.     Patients discharged the day of surgery will not be allowed to drive home. IF YOU ARE HAVING SURGERY AND GOING HOME THE SAME DAY, YOU MUST HAVE AN ADULT TO DRIVE YOU HOME AND BE WITH YOU FOR 24 HOURS. YOU MAY GO HOME BY TAXI OR UBER OR ORTHERWISE, BUT AN ADULT MUST ACCOMPANY YOU HOME AND STAY WITH YOU FOR 24 HOURS.  Name and phone number of your driver:  Special Instructions: N/A              Please read over the following fact sheets you were given: _____________________________________________________________________  [facility] - Preparing for Surgery Before surgery, you can play an important role.  Because skin is not sterile, your skin needs to be as free of germs as possible.  You can reduce the number of germs on your skin by washing with CHG (chlorahexidine gluconate) soap before surgery.  CHG is an antiseptic cleaner which kills germs and bonds with the skin to continue killing germs even after washing. Please DO NOT use if you have an allergy to CHG or antibacterial soaps.  If your skin becomes reddened/irritated stop using the CHG and inform your nurse when you arrive at Short Stay. Do not shave (including legs and underarms) for at least 48 hours prior to the first CHG shower.  You may shave your face/neck. Please follow these instructions carefully:  1.  Shower with CHG Soap the night before surgery and the  morning of Surgery.  2.  If you choose to wash your hair, wash your hair first as usual with your  normal  shampoo.  3.  After you shampoo, rinse your hair and body  thoroughly to remove the  shampoo.                           4.  Use CHG as you would any other liquid soap.  You can apply chg directly  to the skin and wash                       Gently with a scrungie or clean washcloth.  5.  Apply the CHG Soap to your body ONLY FROM THE NECK DOWN.   Do not use on face/ open                           Wound or open sores. Avoid contact with eyes, ears mouth and genitals (private parts).                       Wash face,  Genitals (private parts) with your normal soap.             6.  Wash thoroughly, paying special attention to the area where your surgery  will be performed.  7.  Thoroughly rinse your body with  warm water from the neck down.  8.  DO NOT shower/wash with your normal soap after using and rinsing off  the CHG Soap.                9.  Pat yourself dry with a clean towel.            10.  Wear clean pajamas.            11.  Place clean sheets on your bed the night of your first shower and do not  sleep with pets. Day of Surgery : Do not apply any lotions/deodorants the morning of surgery.  Please wear clean clothes to the hospital/surgery center.  FAILURE TO FOLLOW THESE INSTRUCTIONS MAY RESULT IN THE CANCELLATION OF YOUR SURGERY PATIENT SIGNATURE_________________________________  NURSE SIGNATURE__________________________________  ________________________________________________________________________

## 2021-05-12 NOTE — Progress Notes (Addendum)
A         Anesthesia Review:  PCP: Franne Grip 04/21/21  Cardiologist : none  DR Oliver Pila- Hematologist- follows Eliquis  LOV 01/16/21 Chest x-ray : 02/23/21  Ct angio chest- 12/22/20  EKG : 12/22/20  01/16/21- Monitor  Echo : 02/24/21  Stress test: Cardiac Cath :  Activity level: can do a flight of stairs withoiut difficulty  Sleep Study/ CPAP : none  Fasting Blood Sugar :      / Checks Blood Sugar -- times a day:   Blood Thinner/ Instructions /Last Dose: ASA / Instructions/ Last Dose :   Eliquis- AT time of preop appt pt has been given no preop instructions regarding Eliquis.  Informed pt that nurse would call office of DR [name] and speak with Triage  and let them be aware.  Called [facility] and spoke with Abigail Butts in Triage and made her aware of above.  Abigail Butts stated she would let Dr Redmond Pulling know.   With anesthesia- patient reports blood pressure drops with epidural..  Happened during childbirth.

## 2021-05-19 ENCOUNTER — Other Ambulatory Visit: Payer: Self-pay | Admitting: General Surgery

## 2021-05-19 ENCOUNTER — Encounter (HOSPITAL_COMMUNITY): Payer: Self-pay | Admitting: General Surgery

## 2021-05-20 LAB — SARS CORONAVIRUS 2 (TAT 6-24 HRS): SARS Coronavirus 2: NEGATIVE

## 2021-05-21 MED ORDER — BUPIVACAINE LIPOSOME 1.3 % IJ SUSP
20.0000 mL | Freq: Once | INTRAMUSCULAR | Status: DC
  Filled 2021-05-21: qty 20

## 2021-05-21 MED ORDER — SODIUM CHLORIDE 0.9 % IV SOLN
2.0000 g | INTRAVENOUS | Status: AC
  Administered 2021-05-22: 2 g via INTRAVENOUS
  Filled 2021-05-21: qty 2

## 2021-05-21 NOTE — Anesthesia Preprocedure Evaluation (Addendum)
Anesthesia Evaluation  Patient identified by MRN, date of birth, ID band Patient awake    Reviewed: Allergy & Precautions, NPO status , Patient's Chart, lab work & pertinent test results  History of Anesthesia Complications (+) PONV and history of anesthetic complications  Airway Mallampati: IV  TM Distance: >3 FB Neck ROM: Full    Dental no notable dental hx.    Pulmonary PE (on Eliquis, 2018)   Pulmonary exam normal        Cardiovascular negative cardio ROS Normal cardiovascular exam     Neuro/Psych Seizures - (last at age 52),  Anxiety ADHD   GI/Hepatic Neg liver ROS, hiatal hernia, GERD  Controlled,  Endo/Other  Morbid obesity (BMI 51)  Renal/GU negative Renal ROS  negative genitourinary   Musculoskeletal  (+) Arthritis ,   Abdominal   Peds  Hematology negative hematology ROS (+)   Anesthesia Other Findings Day of surgery medications reviewed with patient.  Reproductive/Obstetrics negative OB ROS                            Anesthesia Physical Anesthesia Plan  ASA: 3  Anesthesia Plan: General   Post-op Pain Management:    Induction: Intravenous  PONV Risk Score and Plan: 4 or greater and Scopolamine patch - Pre-op, Midazolam, Dexamethasone, Ondansetron, Propofol infusion, Treatment may vary due to age or medical condition and Aprepitant  Airway Management Planned: Oral ETT  Additional Equipment: None  Intra-op Plan:   Post-operative Plan: Extubation in OR  Informed Consent: I have reviewed the patients History and Physical, chart, labs and discussed the procedure including the risks, benefits and alternatives for the proposed anesthesia with the patient or authorized representative who has indicated his/her understanding and acceptance.     Dental advisory given  Plan Discussed with: CRNA  Anesthesia Plan Comments:        Anesthesia Quick Evaluation

## 2021-05-22 ENCOUNTER — Encounter (HOSPITAL_COMMUNITY)
Admission: RE | Disposition: A | Discharge status: Home / Self Care | Payer: Self-pay | Source: Home / Self Care | Attending: General Surgery

## 2021-05-22 ENCOUNTER — Inpatient Hospital Stay (HOSPITAL_COMMUNITY): Payer: 59 | Admitting: Physician Assistant

## 2021-05-22 ENCOUNTER — Encounter (HOSPITAL_COMMUNITY): Payer: Self-pay | Admitting: General Surgery

## 2021-05-22 ENCOUNTER — Encounter: Payer: Self-pay | Admitting: Family Medicine

## 2021-05-22 ENCOUNTER — Inpatient Hospital Stay (HOSPITAL_COMMUNITY)
Admission: RE | Admit: 2021-05-22 | Discharge: 2021-05-23 | DRG: 621 | Disposition: A | Discharge status: Home / Self Care | Payer: 59 | Attending: General Surgery | Admitting: General Surgery

## 2021-05-22 ENCOUNTER — Inpatient Hospital Stay (HOSPITAL_COMMUNITY): Payer: 59 | Admitting: Anesthesiology

## 2021-05-22 ENCOUNTER — Other Ambulatory Visit: Payer: Self-pay

## 2021-05-22 DIAGNOSIS — M545 Low back pain, unspecified: Secondary | ICD-10-CM | POA: Diagnosis present

## 2021-05-22 DIAGNOSIS — Z9884 Bariatric surgery status: Secondary | ICD-10-CM

## 2021-05-22 DIAGNOSIS — Z6841 Body Mass Index (BMI) 40.0 and over, adult: Secondary | ICD-10-CM | POA: Diagnosis not present

## 2021-05-22 DIAGNOSIS — F902 Attention-deficit hyperactivity disorder, combined type: Secondary | ICD-10-CM

## 2021-05-22 DIAGNOSIS — E785 Hyperlipidemia, unspecified: Secondary | ICD-10-CM | POA: Diagnosis present

## 2021-05-22 DIAGNOSIS — Z888 Allergy status to other drugs, medicaments and biological substances status: Secondary | ICD-10-CM | POA: Diagnosis not present

## 2021-05-22 DIAGNOSIS — Z885 Allergy status to narcotic agent status: Secondary | ICD-10-CM | POA: Diagnosis not present

## 2021-05-22 DIAGNOSIS — G8929 Other chronic pain: Secondary | ICD-10-CM | POA: Diagnosis present

## 2021-05-22 DIAGNOSIS — Z86711 Personal history of pulmonary embolism: Secondary | ICD-10-CM | POA: Diagnosis not present

## 2021-05-22 DIAGNOSIS — Z8249 Family history of ischemic heart disease and other diseases of the circulatory system: Secondary | ICD-10-CM

## 2021-05-22 DIAGNOSIS — M1990 Unspecified osteoarthritis, unspecified site: Secondary | ICD-10-CM | POA: Diagnosis present

## 2021-05-22 DIAGNOSIS — E282 Polycystic ovarian syndrome: Secondary | ICD-10-CM | POA: Diagnosis present

## 2021-05-22 DIAGNOSIS — Z7901 Long term (current) use of anticoagulants: Secondary | ICD-10-CM | POA: Diagnosis not present

## 2021-05-22 DIAGNOSIS — Z9049 Acquired absence of other specified parts of digestive tract: Secondary | ICD-10-CM | POA: Diagnosis not present

## 2021-05-22 DIAGNOSIS — Z79899 Other long term (current) drug therapy: Secondary | ICD-10-CM

## 2021-05-22 DIAGNOSIS — F909 Attention-deficit hyperactivity disorder, unspecified type: Secondary | ICD-10-CM | POA: Diagnosis present

## 2021-05-22 DIAGNOSIS — R7303 Prediabetes: Secondary | ICD-10-CM | POA: Diagnosis present

## 2021-05-22 HISTORY — PX: LAPAROSCOPIC GASTRIC SLEEVE RESECTION WITH HIATAL HERNIA REPAIR: SHX6512

## 2021-05-22 HISTORY — PX: UPPER GI ENDOSCOPY: SHX6162

## 2021-05-22 LAB — COMPREHENSIVE METABOLIC PANEL
ALT: 48 U/L — ABNORMAL HIGH (ref 0–44)
AST: 38 U/L (ref 15–41)
Albumin: 4 g/dL (ref 3.5–5.0)
Alkaline Phosphatase: 84 U/L (ref 38–126)
Anion gap: 6 (ref 5–15)
BUN: 12 mg/dL (ref 6–20)
CO2: 25 mmol/L (ref 22–32)
Calcium: 8.8 mg/dL — ABNORMAL LOW (ref 8.9–10.3)
Chloride: 106 mmol/L (ref 98–111)
Creatinine, Ser: 0.68 mg/dL (ref 0.44–1.00)
GFR, Estimated: >60 mL/min (ref >60)
Glucose, Bld: 134 mg/dL — ABNORMAL HIGH (ref 70–99)
Potassium: 3.6 mmol/L (ref 3.5–5.1)
Sodium: 137 mmol/L (ref 135–145)
Total Bilirubin: 0.7 mg/dL (ref 0.3–1.2)
Total Protein: 7.2 g/dL (ref 6.5–8.1)

## 2021-05-22 LAB — PREGNANCY, URINE: Preg Test, Ur: NEGATIVE

## 2021-05-22 LAB — CBC
HCT: 41.4 % (ref 36.0–46.0)
Hemoglobin: 13.7 g/dL (ref 12.0–15.0)
MCH: 29.5 pg (ref 26.0–34.0)
MCHC: 33.1 g/dL (ref 30.0–36.0)
MCV: 89.2 fL (ref 80.0–100.0)
Platelets: 333 10*3/uL (ref 150–400)
RBC: 4.64 MIL/uL (ref 3.87–5.11)
RDW: 13.2 % (ref 11.5–15.5)
WBC: 10.5 10*3/uL (ref 4.0–10.5)
nRBC: 0 % (ref 0.0–0.2)

## 2021-05-22 LAB — TYPE AND SCREEN
ABO/RH(D): A POS
Antibody Screen: NEGATIVE

## 2021-05-22 LAB — HEMOGLOBIN AND HEMATOCRIT, BLOOD
HCT: 40 % (ref 36.0–46.0)
Hemoglobin: 13.1 g/dL (ref 12.0–15.0)

## 2021-05-22 SURGERY — GASTRECTOMY, SLEEVE, ROBOT-ASSISTED
Anesthesia: General

## 2021-05-22 MED ORDER — OXYCODONE HCL 5 MG/5ML PO SOLN: 5.0000 mg | Freq: Once | ORAL | Status: DC | PRN

## 2021-05-22 MED ORDER — APREPITANT 40 MG PO CAPS
40.0000 mg | ORAL_CAPSULE | ORAL | Status: AC
  Administered 2021-05-22: 40 mg via ORAL
  Filled 2021-05-22: qty 1

## 2021-05-22 MED ORDER — KCL IN DEXTROSE-NACL 20-5-0.45 MEQ/L-%-% IV SOLN
INTRAVENOUS | Status: DC
  Filled 2021-05-22 (×3): qty 1000

## 2021-05-22 MED ORDER — PANTOPRAZOLE SODIUM 40 MG IV SOLR
40.0000 mg | Freq: Every day | INTRAVENOUS | Status: DC
  Administered 2021-05-22: 40 mg via INTRAVENOUS
  Filled 2021-05-22: qty 40

## 2021-05-22 MED ORDER — ONDANSETRON HCL 4 MG/2ML IJ SOLN
INTRAMUSCULAR | Status: DC | PRN
  Administered 2021-05-22: 4 mg via INTRAVENOUS

## 2021-05-22 MED ORDER — LACTATED RINGERS IV SOLN: INTRAVENOUS | Status: DC

## 2021-05-22 MED ORDER — BUPIVACAINE LIPOSOME 1.3 % IJ SUSP
INTRAMUSCULAR | Status: DC | PRN
  Administered 2021-05-22: 20 mL

## 2021-05-22 MED ORDER — SUGAMMADEX SODIUM 500 MG/5ML IV SOLN
INTRAVENOUS | Status: DC | PRN
  Administered 2021-05-22: 400 mg via INTRAVENOUS

## 2021-05-22 MED ORDER — CHLORHEXIDINE GLUCONATE 4 % EX LIQD: 60.0000 mL | Freq: Once | CUTANEOUS | Status: DC

## 2021-05-22 MED ORDER — GLYCOPYRROLATE 0.2 MG/ML IJ SOLN
INTRAMUSCULAR | Status: AC
  Filled 2021-05-22: qty 1

## 2021-05-22 MED ORDER — KETAMINE HCL 10 MG/ML IJ SOLN
INTRAMUSCULAR | Status: AC
  Filled 2021-05-22: qty 1

## 2021-05-22 MED ORDER — SUGAMMADEX SODIUM 500 MG/5ML IV SOLN
INTRAVENOUS | Status: AC
  Filled 2021-05-22: qty 5

## 2021-05-22 MED ORDER — DIPHENHYDRAMINE HCL 50 MG/ML IJ SOLN
12.5000 mg | Freq: Three times a day (TID) | INTRAMUSCULAR | Status: DC | PRN
Start: 2021-05-22 — End: 2021-05-23

## 2021-05-22 MED ORDER — ACETAMINOPHEN 160 MG/5ML PO SOLN: 1000.0000 mg | Freq: Three times a day (TID) | ORAL | Status: DC

## 2021-05-22 MED ORDER — PHENYLEPHRINE HCL-NACL 20-0.9 MG/250ML-% IV SOLN
INTRAVENOUS | Status: DC | PRN
  Administered 2021-05-22: 40 ug/min via INTRAVENOUS

## 2021-05-22 MED ORDER — PHENYLEPHRINE HCL (PRESSORS) 10 MG/ML IV SOLN
INTRAVENOUS | Status: DC | PRN
  Administered 2021-05-22: 160 ug via INTRAVENOUS
  Administered 2021-05-22: 80 ug via INTRAVENOUS

## 2021-05-22 MED ORDER — DROPERIDOL 2.5 MG/ML IJ SOLN
INTRAMUSCULAR | Status: AC
  Filled 2021-05-22: qty 2

## 2021-05-22 MED ORDER — DEXAMETHASONE SODIUM PHOSPHATE 4 MG/ML IJ SOLN: 4.0000 mg | INTRAMUSCULAR | Status: DC

## 2021-05-22 MED ORDER — DROPERIDOL 2.5 MG/ML IJ SOLN
0.6250 mg | Freq: Once | INTRAMUSCULAR | Status: AC | PRN
  Administered 2021-05-22: 0.625 mg via INTRAVENOUS

## 2021-05-22 MED ORDER — ORAL CARE MOUTH RINSE: 15.0000 mL | Freq: Once | OROMUCOSAL | Status: AC

## 2021-05-22 MED ORDER — LIDOCAINE 2% (20 MG/ML) 5 ML SYRINGE
INTRAMUSCULAR | Status: AC
  Filled 2021-05-22: qty 10

## 2021-05-22 MED ORDER — 0.9 % SODIUM CHLORIDE (POUR BTL) OPTIME
TOPICAL | Status: DC | PRN
  Administered 2021-05-22: 1000 mL

## 2021-05-22 MED ORDER — DEXAMETHASONE SODIUM PHOSPHATE 10 MG/ML IJ SOLN
INTRAMUSCULAR | Status: DC | PRN
  Administered 2021-05-22: 10 mg via INTRAVENOUS

## 2021-05-22 MED ORDER — ROCURONIUM BROMIDE 10 MG/ML (PF) SYRINGE
PREFILLED_SYRINGE | INTRAVENOUS | Status: AC
  Filled 2021-05-22: qty 20

## 2021-05-22 MED ORDER — CHLORHEXIDINE GLUCONATE 0.12 % MT SOLN
15.0000 mL | Freq: Once | OROMUCOSAL | Status: AC
  Administered 2021-05-22: 15 mL via OROMUCOSAL

## 2021-05-22 MED ORDER — FENTANYL CITRATE (PF) 100 MCG/2ML IJ SOLN
INTRAMUSCULAR | Status: DC | PRN
  Administered 2021-05-22: 100 ug via INTRAVENOUS
  Administered 2021-05-22: 50 ug via INTRAVENOUS

## 2021-05-22 MED ORDER — OXYCODONE HCL 5 MG PO TABS: 5.0000 mg | ORAL_TABLET | Freq: Once | ORAL | Status: DC | PRN

## 2021-05-22 MED ORDER — SIMETHICONE 80 MG PO CHEW: 80.0000 mg | CHEWABLE_TABLET | Freq: Four times a day (QID) | ORAL | Status: DC | PRN

## 2021-05-22 MED ORDER — PROPOFOL 10 MG/ML IV BOLUS
INTRAVENOUS | Status: AC
  Filled 2021-05-22: qty 60

## 2021-05-22 MED ORDER — PROPOFOL 1000 MG/100ML IV EMUL
INTRAVENOUS | Status: AC
  Filled 2021-05-22: qty 200

## 2021-05-22 MED ORDER — SODIUM CHLORIDE 0.9 % IV SOLN
12.5000 mg | Freq: Four times a day (QID) | INTRAVENOUS | Status: DC | PRN
  Filled 2021-05-22: qty 0.5

## 2021-05-22 MED ORDER — DEXAMETHASONE SODIUM PHOSPHATE 10 MG/ML IJ SOLN
INTRAMUSCULAR | Status: AC
  Filled 2021-05-22: qty 2

## 2021-05-22 MED ORDER — PROPOFOL 500 MG/50ML IV EMUL
INTRAVENOUS | Status: DC | PRN
  Administered 2021-05-22: 200 ug/kg/min via INTRAVENOUS

## 2021-05-22 MED ORDER — HEPARIN SODIUM (PORCINE) 5000 UNIT/ML IJ SOLN
5000.0000 [IU] | INTRAMUSCULAR | Status: AC
  Administered 2021-05-22: 5000 [IU] via SUBCUTANEOUS
  Filled 2021-05-22: qty 1

## 2021-05-22 MED ORDER — FENTANYL CITRATE (PF) 100 MCG/2ML IJ SOLN
INTRAMUSCULAR | Status: AC
  Filled 2021-05-22: qty 2

## 2021-05-22 MED ORDER — ENOXAPARIN SODIUM 30 MG/0.3ML IJ SOSY
30.0000 mg | PREFILLED_SYRINGE | Freq: Two times a day (BID) | INTRAMUSCULAR | Status: DC
  Administered 2021-05-22 – 2021-05-23 (×2): 30 mg via SUBCUTANEOUS
  Filled 2021-05-22 (×2): qty 0.3

## 2021-05-22 MED ORDER — MIDAZOLAM HCL 5 MG/5ML IJ SOLN
INTRAMUSCULAR | Status: DC | PRN
  Administered 2021-05-22: 2 mg via INTRAVENOUS

## 2021-05-22 MED ORDER — SCOPOLAMINE 1 MG/3DAYS TD PT72
1.0000 | MEDICATED_PATCH | TRANSDERMAL | Status: DC
  Administered 2021-05-22: 1.5 mg via TRANSDERMAL

## 2021-05-22 MED ORDER — LIDOCAINE 2% (20 MG/ML) 5 ML SYRINGE
INTRAMUSCULAR | Status: DC | PRN
  Administered 2021-05-22: 100 mg via INTRAVENOUS

## 2021-05-22 MED ORDER — GABAPENTIN 100 MG PO CAPS
200.0000 mg | ORAL_CAPSULE | Freq: Two times a day (BID) | ORAL | Status: DC
  Administered 2021-05-22 – 2021-05-23 (×2): 200 mg via ORAL
  Filled 2021-05-22 (×2): qty 2

## 2021-05-22 MED ORDER — MIDAZOLAM HCL 2 MG/2ML IJ SOLN
INTRAMUSCULAR | Status: AC
  Filled 2021-05-22: qty 2

## 2021-05-22 MED ORDER — ROCURONIUM BROMIDE 10 MG/ML (PF) SYRINGE
PREFILLED_SYRINGE | INTRAVENOUS | Status: DC | PRN
  Administered 2021-05-22: 70 mg via INTRAVENOUS
  Administered 2021-05-22 (×2): 15 mg via INTRAVENOUS

## 2021-05-22 MED ORDER — SCOPOLAMINE 1 MG/3DAYS TD PT72
1.0000 | MEDICATED_PATCH | Freq: Once | TRANSDERMAL | Status: DC
  Filled 2021-05-22: qty 1

## 2021-05-22 MED ORDER — PHENYLEPHRINE HCL-NACL 20-0.9 MG/250ML-% IV SOLN
INTRAVENOUS | Status: AC
  Filled 2021-05-22: qty 250

## 2021-05-22 MED ORDER — GABAPENTIN 300 MG PO CAPS
300.0000 mg | ORAL_CAPSULE | ORAL | Status: AC
  Administered 2021-05-22: 300 mg via ORAL
  Filled 2021-05-22: qty 1

## 2021-05-22 MED ORDER — ACETAMINOPHEN 500 MG PO TABS
1000.0000 mg | ORAL_TABLET | ORAL | Status: AC
  Administered 2021-05-22: 1000 mg via ORAL
  Filled 2021-05-22: qty 2

## 2021-05-22 MED ORDER — HYDRALAZINE HCL 20 MG/ML IJ SOLN: 10.0000 mg | INTRAMUSCULAR | Status: DC | PRN

## 2021-05-22 MED ORDER — ENSURE MAX PROTEIN PO LIQD
2.0000 [oz_av] | ORAL | Status: DC
  Administered 2021-05-23: 2 [oz_av] via ORAL

## 2021-05-22 MED ORDER — LACTATED RINGERS IR SOLN
Status: DC | PRN
  Administered 2021-05-22: 1000 mL

## 2021-05-22 MED ORDER — ONDANSETRON HCL 4 MG/2ML IJ SOLN
INTRAMUSCULAR | Status: AC
  Filled 2021-05-22: qty 4

## 2021-05-22 MED ORDER — PROPOFOL 1000 MG/100ML IV EMUL
INTRAVENOUS | Status: AC
  Filled 2021-05-22: qty 100

## 2021-05-22 MED ORDER — PROPOFOL 10 MG/ML IV BOLUS
INTRAVENOUS | Status: DC | PRN
  Administered 2021-05-22: 200 mg via INTRAVENOUS

## 2021-05-22 MED ORDER — ACETAMINOPHEN 500 MG PO TABS
1000.0000 mg | ORAL_TABLET | Freq: Three times a day (TID) | ORAL | Status: DC
  Administered 2021-05-22 – 2021-05-23 (×3): 1000 mg via ORAL
  Filled 2021-05-22 (×4): qty 2

## 2021-05-22 MED ORDER — OXYCODONE HCL 5 MG/5ML PO SOLN
5.0000 mg | Freq: Four times a day (QID) | ORAL | Status: DC | PRN
  Administered 2021-05-22 (×2): 5 mg via ORAL
  Filled 2021-05-22 (×3): qty 5

## 2021-05-22 MED ORDER — ONDANSETRON HCL 4 MG/2ML IJ SOLN
4.0000 mg | Freq: Four times a day (QID) | INTRAMUSCULAR | Status: DC | PRN
Start: 2021-05-22 — End: 2021-05-23

## 2021-05-22 MED ORDER — FENTANYL CITRATE (PF) 100 MCG/2ML IJ SOLN
25.0000 ug | INTRAMUSCULAR | Status: DC | PRN
  Administered 2021-05-22: 50 ug via INTRAVENOUS

## 2021-05-22 MED ORDER — SODIUM CHLORIDE 0.9% FLUSH
INTRAVENOUS | Status: DC | PRN
  Administered 2021-05-22: 50 mL

## 2021-05-22 MED ORDER — FENTANYL CITRATE (PF) 100 MCG/2ML IJ SOLN
25.0000 ug | INTRAMUSCULAR | Status: DC | PRN
  Administered 2021-05-22: 25 ug via INTRAVENOUS
  Filled 2021-05-22: qty 2

## 2021-05-22 MED ORDER — KETAMINE HCL 10 MG/ML IJ SOLN
INTRAMUSCULAR | Status: DC | PRN
  Administered 2021-05-22: 30 mg via INTRAVENOUS

## 2021-05-22 SURGICAL SUPPLY — 85 items
ADH SKN CLS APL DERMABOND .7 (GAUZE/BANDAGES/DRESSINGS) ×1
APL PRP STRL LF DISP 70% ISPRP (MISCELLANEOUS) ×2
APPLIER CLIP 5 13 M/L LIGAMAX5 (MISCELLANEOUS)
APPLIER CLIP ROT 10 11.4 M/L (STAPLE)
APR CLP MED LRG 11.4X10 (STAPLE)
APR CLP MED LRG 5 ANG JAW (MISCELLANEOUS)
BAG COUNTER SPONGE SURGICOUNT (BAG) ×1 IMPLANT
BAG SPNG CNTER NS LX DISP (BAG)
BLADE SURG SZ11 CARB STEEL (BLADE) ×2 IMPLANT
CANNULA REDUC XI 12-8 STAPL (CANNULA) ×2
CANNULA REDUCER 12-8 DVNC XI (CANNULA) ×1 IMPLANT
CHLORAPREP W/TINT 26 (MISCELLANEOUS) ×4 IMPLANT
CLIP APPLIE 5 13 M/L LIGAMAX5 (MISCELLANEOUS) IMPLANT
CLIP APPLIE ROT 10 11.4 M/L (STAPLE) IMPLANT
COVER MAYO STAND STRL (DRAPES) ×2 IMPLANT
COVER SURGICAL LIGHT HANDLE (MISCELLANEOUS) ×2 IMPLANT
COVER TIP SHEARS 8 DVNC (MISCELLANEOUS) IMPLANT
COVER TIP SHEARS 8MM DA VINCI (MISCELLANEOUS)
DECANTER SPIKE VIAL GLASS SM (MISCELLANEOUS) ×2 IMPLANT
DERMABOND ADVANCED (GAUZE/BANDAGES/DRESSINGS) ×1
DERMABOND ADVANCED .7 DNX12 (GAUZE/BANDAGES/DRESSINGS) ×1 IMPLANT
DRAPE 3/4 80X56 (DRAPES) ×2 IMPLANT
DRAPE ARM DVNC X/XI (DISPOSABLE) ×3 IMPLANT
DRAPE COLUMN DVNC XI (DISPOSABLE) IMPLANT
DRAPE DA VINCI XI ARM (DISPOSABLE) ×8
DRAPE DA VINCI XI COLUMN (DISPOSABLE) ×2
DRSG TEGADERM 2-3/8X2-3/4 SM (GAUZE/BANDAGES/DRESSINGS) ×6 IMPLANT
ELECT REM PT RETURN 15FT ADLT (MISCELLANEOUS) ×2 IMPLANT
ENDOLOOP SUT PDS II  0 18 (SUTURE)
ENDOLOOP SUT PDS II 0 18 (SUTURE) IMPLANT
GAUZE 4X4 16PLY ~~LOC~~+RFID DBL (SPONGE) ×2 IMPLANT
GAUZE SPONGE 2X2 8PLY STRL LF (GAUZE/BANDAGES/DRESSINGS) ×1 IMPLANT
GLOVE SRG 8 PF TXTR STRL LF DI (GLOVE) ×1 IMPLANT
GLOVE SURG POLY ORTHO LF SZ7.5 (GLOVE) ×4 IMPLANT
GLOVE SURG UNDER POLY LF SZ8 (GLOVE) ×2
GOWN STRL REUS W/TWL LRG LVL3 (GOWN DISPOSABLE) ×7 IMPLANT
GOWN STRL REUS W/TWL XL LVL3 (GOWN DISPOSABLE) ×4 IMPLANT
GRASPER SUT TROCAR 14GX15 (MISCELLANEOUS) ×2 IMPLANT
KIT BASIN OR (CUSTOM PROCEDURE TRAY) ×2 IMPLANT
MARKER SKIN DUAL TIP RULER LAB (MISCELLANEOUS) ×2 IMPLANT
MAT PREVALON FULL STRYKER (MISCELLANEOUS) ×2 IMPLANT
NDL INSUFFLATION 14GA 120MM (NEEDLE) IMPLANT
NDL SPNL 22GX3.5 QUINCKE BK (NEEDLE) ×1 IMPLANT
NEEDLE INSUFFLATION 14GA 120MM (NEEDLE) IMPLANT
NEEDLE SPNL 22GX3.5 QUINCKE BK (NEEDLE) ×2 IMPLANT
PACK CARDIOVASCULAR III (CUSTOM PROCEDURE TRAY) ×2 IMPLANT
RELOAD STAPLE 60 2.5 WHT DVNC (STAPLE) IMPLANT
RELOAD STAPLE 60 3.5 BLU DVNC (STAPLE) ×2 IMPLANT
RELOAD STAPLE 60 4.3 GRN DVNC (STAPLE) ×1 IMPLANT
RELOAD STAPLER 2.5X60 WHT DVNC (STAPLE) ×4 IMPLANT
RELOAD STAPLER 3.5X60 BLU DVNC (STAPLE) ×2 IMPLANT
RELOAD STAPLER 4.3X60 GRN DVNC (STAPLE) ×1 IMPLANT
SCISSORS LAP 5X35 DISP (ENDOMECHANICALS) IMPLANT
SEAL CANN UNIV 5-8 DVNC XI (MISCELLANEOUS) ×2 IMPLANT
SEAL XI 5MM-8MM UNIVERSAL (MISCELLANEOUS) ×6
SEALER VESSEL DA VINCI XI (MISCELLANEOUS) ×2
SEALER VESSEL EXT DVNC XI (MISCELLANEOUS) ×1 IMPLANT
SET IRRIG TUBING LAPAROSCOPIC (IRRIGATION / IRRIGATOR) ×2 IMPLANT
SLEEVE GASTRECTOMY 40FR VISIGI (MISCELLANEOUS) ×2 IMPLANT
SOL ANTI FOG 6CC (MISCELLANEOUS) ×1 IMPLANT
SOLUTION ANTI FOG 6CC (MISCELLANEOUS) ×1
SOLUTION ELECTROLUBE (MISCELLANEOUS) ×2 IMPLANT
SPONGE GAUZE 2X2 STER 10/PKG (GAUZE/BANDAGES/DRESSINGS) ×1
STAPLER 60 DA VINCI SURE FORM (STAPLE) ×2
STAPLER 60 SUREFORM DVNC (STAPLE) ×1 IMPLANT
STAPLER CANNULA SEAL DVNC XI (STAPLE) ×1 IMPLANT
STAPLER CANNULA SEAL XI (STAPLE) ×2
STAPLER RELOAD 2.5X60 WHITE (STAPLE) ×8
STAPLER RELOAD 2.5X60 WHT DVNC (STAPLE) ×4
STAPLER RELOAD 3.5X60 BLU DVNC (STAPLE) ×2
STAPLER RELOAD 3.5X60 BLUE (STAPLE) ×4
STAPLER RELOAD 4.3X60 GREEN (STAPLE) ×2
STAPLER RELOAD 4.3X60 GRN DVNC (STAPLE) ×1
STRIP CLOSURE SKIN 1/2X4 (GAUZE/BANDAGES/DRESSINGS) ×1 IMPLANT
SUT MNCRL AB 4-0 PS2 18 (SUTURE) ×2 IMPLANT
SUT VICRYL 0 TIES 12 18 (SUTURE) ×2 IMPLANT
SUT VLOC 180 2-0 9IN GS21 (SUTURE) IMPLANT
SYR 20ML LL LF (SYRINGE) ×2 IMPLANT
TAPE STRIPS DRAPE STRL (GAUZE/BANDAGES/DRESSINGS) ×2 IMPLANT
TOWEL OR 17X26 10 PK STRL BLUE (TOWEL DISPOSABLE) ×2 IMPLANT
TOWEL OR NON WOVEN STRL DISP B (DISPOSABLE) ×2 IMPLANT
TRAY FOLEY MTR SLVR 16FR STAT (SET/KITS/TRAYS/PACK) IMPLANT
TROCAR ADV FIXATION 5X100MM (TROCAR) IMPLANT
TROCAR BLADELESS OPT 5 100 (ENDOMECHANICALS) ×2 IMPLANT
TUBING INSUFFLATION 10FT LAP (TUBING) ×2 IMPLANT

## 2021-05-22 NOTE — Interval H&P Note (Signed)
History and Physical Interval Note:  05/22/2021 7:28 AM  Barbara Mcmahon  has presented today for surgery, with the diagnosis of MORBID OBESITY.  The various methods of treatment have been discussed with the patient and family. After consideration of risks, benefits and other options for treatment, the patient has consented to  Procedure(s): XI ROBOT ASSISTED GASTRIC SLEEVE RESECTION WITH HIATAL HERNIA REPAIR (N/A) UPPER GI ENDOSCOPY (N/A) as a surgical intervention.  The patient's history has been reviewed, patient examined, no change in status, stable for surgery.  I have reviewed the patient's chart and labs.  Questions were answered to the patient's satisfaction.    Leighton Ruff. Redmond Pulling, MD, FACS General, Bariatric, & Minimally Invasive Surgery [facility] [place]   [name]

## 2021-05-22 NOTE — Op Note (Signed)
05/22/2021 Barbara Mcmahon 08-Oct-1976 [id number]   PRE-OPERATIVE DIAGNOSIS:   Principal Problem:   Severe obesity (BMI 51) Active Problems:   PCOS (polycystic ovarian syndrome)   Family history of hypertrophic cardiomyopathy   History of pulmonary embolism   Prediabetes   Dyslipidemia   POST-OPERATIVE DIAGNOSIS:  same  PROCEDURE:  Procedure(s): Xi robotic SLEEVE GASTRECTOMY  UPPER GI ENDOSCOPY  SURGEON:  Surgeon(s): Gayland Curry, MD FACS FASMBS  ASSISTANTS: Gurney Maxin MD FACS  ANESTHESIA:   general  DRAINS: none   BOUGIE: 40 fr ViSiGi  LOCAL MEDICATIONS USED:   Exparel  EBL: <25 ml  SPECIMEN:  Source of Specimen:  Greater curvature of stomach  DISPOSITION OF SPECIMEN:  PATHOLOGY  COUNTS:  YES  INDICATION FOR PROCEDURE: This is a very pleasant 45 y.o.-year-old severely obese female who has had unsuccessful attempts for sustained weight loss. The patient presents today for a planned laparoscopic sleeve gastrectomy with upper endoscopy. We have discussed the risk and benefits of the procedure extensively preoperatively. Please see my separate notes.  PROCEDURE: After obtaining informed consent and receiving 5000 units of subcutaneous heparin, the patient was brought to the operating room at [facility] and placed supine on the operating room table. General endotracheal anesthesia was established. Sequential compression devices were placed. A orogastric tube was placed. The patient's abdomen was prepped and draped in the usual standard surgical fashion. The patient received preoperative IV antibiotic. A surgical timeout was performed. ERAS protocol used.   Access to the abdomen was achieved using a 5 mm 0 laparoscope thru a 5 mm trocar In the mid clavicular line in the left midabdomen about 3 cm to the left & slight below the level of the umbilicus using the Optiview technique. Pneumoperitoneum was smoothly established up to 15 mm of mercury. The laparoscope  was advanced and the abdominal cavity was surveilled. The patient was then placed in reverse Trendelenburg.  A tap block was performed on patient's right side under direct visualization.  A 12 mm robotic trocar was placed in the mid right abdomen.  The patient was rotated to the right.  The optical entry trocar in the left midabdomen was changed to a 8 mm robotic trocar under direct visualization. An 10m robotic trocar was placed in the lateral left abdominal wall also under direct visualization.  [facility] was placed under the left lobe of the liver through a 5 mm trocar incision site in the subxiphoid position. A final 5 mm trocar was placed in the lateral RUQ.  A tap block was performed along the patient's left side also under direct laparoscopic visualization.     The XI robot was then docked and targeted for upper abdominal anatomy.  Arm 2  was attached to the left paraumbilical abdominal trocar and the camera was inserted.  Anatomy was targeted.  Arms 1 and ,3,4 were then attached to the robotic trochars.  A fenestrated bipolar was placed in arm 1.  A vessel sealer was placed in arm 3, tip up grasper in arm 4.  My assistant stayed at the bedside while I went to the robotic console.  The stomach was inspected. It was completely decompressed and the orogastric tube was removed.  There was no anterior dimple that was obviously visible.     We identified the pylorus and measured 6 cm proximal to the pylorus and identified an area of where we would start taking down the short gastric vessels.  The vessel sealer was used  to take down the short gastric vessels along the greater curvature of the stomach. We were able to enter the lesser sac. We continued to march along the greater curvature of the stomach taking down the short gastrics.  My assistant provided traction laterally to patient's right side.  as we approached the gastrosplenic ligament we took care in this area not to injure the  spleen. We were able to take down the entire gastrosplenic ligament. We then mobilized the fundus away from the left crus of diaphragm. There were not any significant posterior gastric avascular attachments. This left the stomach completely mobilized. No vessels had been taken down along the lesser curvature of the stomach. There was no posterior plug of fat at the hiatus.    We then reidentified the pylorus. A 40Fr ViSiGi was then placed in the oropharynx and advanced down into the stomach and placed in the distal antrum and positioned along the lesser curvature. It was placed under suction which secured the 40Fr ViSiGi in place along the lesser curve. Then using the robotic 60 mm stapler with a blue load, I placed a stapler along the antrum approximately 6cm from the pylorus. The stapler was angled so that there is ample room at the angularis incisura. I then fired the first staple load after inspecting it posteriorly to ensure adequate space both anteriorly and posteriorly.  I uses a 2nd 60 mm blue load staple cartridge. The stapler did not have to pause for compression so at this point I started using white loads. The robotic stapler was then repositioned with a 60 mm white load  and we continued to march up along the [place]. My assistant was exchanging the stapler after firing. The tip up grasper was holding traction along the greater curvature stomach along the cauterized short gastric vessels ensuring that the stomach was symmetrically retracted. Prior to each firing of the staple, we rotated the stomach to ensure that there is adequate stomach left.  As we approached the fundus, I continued using 60 mm white cartridge aiming  lateral to the GE junction after mobilizing some of the esophageal fat pad.  The sleeve was inspected. There is no evidence of cork screw. The staple line appeared hemostatic  The CRNA inflated the ViSiGi to the green zone and the upper abdomen was flooded with saline. There were no  bubbles. The sleeve was decompressed and the ViSiGi removed.I performed an upper endoscopy.  The endoscope was placed in the patient's oropharynx and gently glided down.  Z-line at approximately 35 cm.  The endoscope was advanced into the gastric sleeve and insufflated.  I was able to easily advance the endoscope down into the antrum.  Pylorus was visible.  There is no significant stricture at the incisura.  There was no corkscrewing.  The external staple line was re-inspected and there was no bleeding. There is no evidence of internal bleeding.  The sleeve was decompressed and the endoscope was removed.   The greater curvature the stomach was grasped with a laparoscopic grasper.  At this point I scrubbed back in.  The robot was undocked.  We removed the specimen from the 12 mm trocar site.  The liver retractor was removed. I then closed the 12 mm trocar site with 1 interrupted 0 Vicryl sutures through the fascia using the endoclose. The closure was viewed laparoscopically and it was airtight. Remaining Exparel was then infiltrated in the preperitoneal spaces around the trocar sites. Pneumoperitoneum was released. All trocar sites were closed  with a 4-0 Monocryl in a subcuticular fashion followed by the application of steri-strips, gauze, & tegaderms. The patient was extubated and taken to the recovery room in stable condition. All needle, instrument, and sponge counts were correct x2. There are no immediate complications  (2) 60 mm blue (4) 60 mm white  PLAN OF CARE: Admit to inpatient   PATIENT DISPOSITION:  PACU - hemodynamically stable.   Delay start of Pharmacological VTE agent (>24hrs) due to surgical blood loss or risk of bleeding:  no  Leighton Ruff. Redmond Pulling, MD, FACS FASMBS General, Bariatric, & Minimally Invasive Surgery [facility], [place]

## 2021-05-22 NOTE — H&P (Signed)
PROVIDER: ERIC MCADAMS WILSON, MD  MRN: [id number] DOB: 03/07/1976 DATE OF ENCOUNTER: 05/18/2021 Subjective   Chief Complaint: Pre-op Exam   History of Present Illness: Barbara Mcmahon is a 45 y.o. 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 She has completed the bariatric surgery evaluation pathway. She has received nutritional and psychological clearance. She denies any medical changes since I met her in May of this year. She had recent labs this year. She denies any chest pain, chest pressure, shortness of breath, dyspnea exertion.  I reviewed the cardiology note from May 16. There is a family history of hypertrophic cardiomyopathy. Patient has no cardiac symptoms. Repeat echocardiogram showed some PVCs, EF greater than 55%, left ventricle normal, no regional wall motion abnormalities, moderate asymmetric posterior wall hypertrophy on this echo from May 2022. CT angio chest was negative in March 2022  Review of Systems: A complete review of systems was obtained from the patient. I have reviewed this information and discussed as appropriate with the patient. See HPI as well for other ROS.  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)   Pulmonary embolus (CMS-HCC)   Prediabetes   Past Surgical History:  Procedure Laterality Date   CHOLECYSTECTOMY   EXPLORATORY LAPAROTOMY   KNEE ARTHROSCOPY Left    Allergies  Allergen Reactions   Cinnamon Anaphylaxis, Rash and Shortness Of Breath  Wheezing Wheezing   Codeine Hives, Itching and Rash  Pt says she can take percocet Pt says she can take percocet   Codeine-Guaifenesin  Itching   Morphine Hives, Other (See Comments) and Rash  irritable Big mood changes BEHAVIOR CHANGES   Nutmeg Oil (Myristica Seed Oil) Rash and Shortness Of Breath  wheezing wheezing   Opioids - Morphine Analogues Hives, Itching, Other (See Comments) and Rash  BEHAVIOR CHANGES Big mood changes BEHAVIOR CHANGES irritable BEHAVIOR CHANGES Big mood changes BEHAVIOR CHANGES irritable Other reaction(s): Other (See Comments) BEHAVIOR CHANGES Big mood changes BEHAVIOR CHANGES irritable   Shellfish Containing Products Anaphylaxis, Swelling and Hives  Throat closes Throat closes   Shrimp Anaphylaxis, Hives and Other (See Comments)  Other reaction(s): Wheezing Other reaction(s): Wheezing   Opioids-Meperidine And Related Hives   Oxycodone Hives   Tramadol Hives   Clonazepam Itching   Menthol Contain Prod Hives   Current Outpatient Medications on File Prior to Visit  Medication Sig Dispense Refill   acetaminophen (TYLENOL) 500 MG tablet Take 500 mg by mouth as needed   albuterol 90 mcg/actuation inhaler as needed   apixaban (ELIQUIS) 5 mg tablet Take 5 mg by mouth every 12 (twelve) hours   cetirizine (ZYRTEC) 10 MG tablet Take 10 mg by mouth daily.   cholecalciferol (VITAMIN D3) 1000 unit capsule Take by mouth every Wednesday   cranberry fruit extract (CRANBERRY CONCENTRATE ORAL) Take by mouth once daily   cyclobenzaprine (FLEXERIL) 10 MG tablet Take 10 mg by mouth 3 (three) times daily as needed for Muscle spasms   desonide (DESOWEN) 0.05 % ointment as needed   dextroamphetamine-amphetamine (ADDERALL XR) 20 MG XR capsule Take by mouth once daily   dextroamphetamine-amphetamine (ADDERALL XR) 20 MG XR capsule Take by mouth   dextroamphetamine-amphetamine (ADDERALL) 10   mg tablet Take by mouth once daily as needed   DULoxetine (CYMBALTA) 60 MG DR capsule Take by mouth once daily   DULoxetine (CYMBALTA) 60 MG DR capsule Take by mouth   EPINEPHrine (EPIPEN) 0.3 mg/0.3 mL  auto-injector Inject 0.3 mg into the muscle once as needed for Anaphylaxis   ergocalciferol, vitamin D2, 1,250 mcg (50,000 unit) capsule Take by mouth once a week   lurasidone (LATUDA) 20 mg tablet nightly   melatonin 1 mg tablet Take by mouth nightly   melatonin 10 mg Cap Take by mouth   multivitamin (THERA) tablet Take by mouth   multivitamin-Ca-iron-minerals Tab Take 1 tablet by mouth once daily   multivitamin tablet Take 1 tablet by mouth once daily   No current facility-administered medications on file prior to visit.   Family History  Problem Relation Age of Onset   Hypertrophic cardiomyopathy Father 91  asymptomatic carrier of familial mutation MYBPC3 E542Q   Hypertrophic cardiomyopathy Paternal Uncle 26  h/o HCM; proband in family; MYBPC3 E542Q positive   Hypertrophic cardiomyopathy Paternal Aunt 53  asymptomatic carrier of familial mutation MYBPC3 E542Q   Sudden cardiac death Neg Hx    Social History   Tobacco Use  Smoking Status Never Smoker  Smokeless Tobacco Never Used    Social History   Socioeconomic History   Marital status: Married  Tobacco Use   Smoking status: Never Smoker   Smokeless tobacco: Never Used  Scientific laboratory technician Use: Never used  Substance and Sexual Activity   Alcohol use: Yes   Drug use: No   Objective:   Vitals:  05/18/21 1030  BP: (!) 162/80  Pulse: (!) 115  Temp: 36.9 C (98.4 F)  SpO2: 97%  Weight: (!) 140.8 kg (310 lb 6.4 oz)  Height: 165.1 cm (5' 5")  PainSc: 0-No pain   Body mass index is 51.65 kg/m.  Gen: alert, NAD, non-toxic appearing; severe obesity Pupils: equal, no scleral icterus Pulm: Lungs clear to auscultation, symmetric chest rise CV: regular rate and rhythm Abd: soft, nontender, nondistended. Old trocar sites. No cellulitis. No incisional hernia Ext: no edema,  Skin: no rash, no jaundice  Labs, Imaging and Diagnostic Testing:  Upper GI showed a small hiatal hernia, chest x-ray negative, lupus workup  labs Reviewed a note from her hematologist from April 2022. We received permission to hold her Eliquis 2 days before surgery. She does not need bridging preoperatively per him. No role for filter.  Assessment and Plan:  Diagnoses and all orders for this visit:  Severe obesity (CMS-HCC)  History of pulmonary embolism 2018  Family history of hypertrophic cardiomyopathy  Low HDL (under 40)  Prediabetes   We reviewed her work-up to date. We discussed the finding of a small hiatal hernia. I discussed that I would test for 1 intraoperatively and repaired if needed. I discussed the steps of the repair. I rediscussed the typical hospitalization and the typical recovery. I discussed the typical issues that we see after surgery. We discussed her holding her blood thinner 2 days before surgery. I advised her that generally I would send her out on Lovenox injections twice a day for 2 weeks and then convert her back to her oral blood thinner. We discussed limitations after surgery and return to work expectations. I answered all of her questions.  This patient encounter took 28 minutes today to perform the following: take history, perform exam, review outside records, interpret imaging, counsel the patient on their diagnosis and document  encounter, findings & plan in the EHR  No follow-ups on file.  Eric M. Wilson MD FACS General, Minimally Invasive, & Bariatric Surgery  

## 2021-05-22 NOTE — Progress Notes (Signed)
Water started at 1200 p.m.

## 2021-05-22 NOTE — Anesthesia Postprocedure Evaluation (Signed)
Anesthesia Post Note  Patient: Barbara Mcmahon  Procedure(s) Performed: XI ROBOT ASSISTED GASTRIC SLEEVE RESECTION WITH HIATAL HERNIA REPAIR UPPER GI ENDOSCOPY     Patient location during evaluation: PACU Anesthesia Type: General Level of consciousness: awake and alert and oriented Pain management: pain level controlled Vital Signs Assessment: post-procedure vital signs reviewed and stable Respiratory status: spontaneous breathing, nonlabored ventilation and respiratory function stable Cardiovascular status: blood pressure returned to baseline Postop Assessment: no apparent nausea or vomiting Anesthetic complications: no   No notable events documented.  Last Vitals:  Vitals:   05/22/21 1211 05/22/21 1301  BP: (!) 159/74 (!) 160/80  Pulse: 74 64  Resp: 18 18  Temp: (!) 36.4 C 36.7 C  SpO2: 99% 100%    Last Pain:  Vitals:   05/22/21 1301  TempSrc: Oral  PainSc:                  Brennan Bailey

## 2021-05-22 NOTE — Anesthesia Procedure Notes (Signed)
Procedure Name: Intubation Date/Time: 05/22/2021 8:03 AM Performed by: Lavina Hamman, CRNA Pre-anesthesia Checklist: Patient identified, Emergency Drugs available, Suction available, Patient being monitored and Timeout performed Patient Re-evaluated:Patient Re-evaluated prior to induction Oxygen Delivery Method: Circle system utilized Preoxygenation: Pre-oxygenation with 100% oxygen Induction Type: IV induction Ventilation: Mask ventilation without difficulty and Oral airway inserted - appropriate to patient size Laryngoscope Size: Mac and 3 Grade View: Grade II Tube type: Oral Tube size: 7.0 mm Number of attempts: 1 Airway Equipment and Method: Stylet Placement Confirmation: ETT inserted through vocal cords under direct vision, positive ETCO2, CO2 detector and breath sounds checked- equal and bilateral Secured at: 21 cm Tube secured with: Tape Dental Injury: Teeth and Oropharynx as per pre-operative assessment  Comments: ATOI, easy mask with OAW, small mouth opening.

## 2021-05-22 NOTE — Progress Notes (Signed)
PHARMACY CONSULT FOR:  Risk Assessment for Post-Discharge VTE Following Bariatric Surgery  Post-Discharge VTE Risk Assessment: This patient's probability of 30-day post-discharge VTE is increased due to the factors marked:   Female    Age >/=60 years   X BMI >/=50 kg/m2    CHF    Dyspnea at Rest    Paraplegia   X Non-gastric-band surgery    Operation Time >/=3 hr    Return to OR     Length of Stay >/= 3 d  X Hx of VTE   Hypercoagulable condition   Significant venous stasis       Predicted probability of 30-day post-discharge VTE: N/A d/t h/o PE  Other patient-specific factors to consider: H/O PE on Eliquis PTA  Recommendation for Discharge: Recommendation for Discharge: Resume full-dose anticoagulation as soon as medically safe per surgery MD assessment.   Given limited published data on reliability of DOAC absorption in this setting, will await MD decision on whether to use enoxaparin vs resume DOAC.  Barbara Mcmahon is a 45 y.o. female who underwent  laparoscopic sleeve gastrectomy on 05/22/21   Case start: 0804 Case end: 0929   Allergies  Allergen Reactions   Cinnamon Shortness Of Breath and Rash    Wheezing    Guaifenesin-Codeine Itching   Morphine Itching, Rash and Hives    Other reaction(s): Other (See Comments) BEHAVIOR CHANGES Big mood changes BEHAVIOR CHANGES irritable   Nutmeg Oil (Myristica Oil) Shortness Of Breath and Rash    wheezing   Shellfish Allergy Hives, Swelling and Anaphylaxis    Throat closes   Shrimp Extract Allergy Skin Test     Other reaction(s): Wheezing   Codeine Hives and Itching    Pt says she can take percocet   Clonazepam Itching   Menthol Hives   Robitussin A-C [Guaifenesin-Codeine] Itching   Tramadol Itching   Yeast Hives    Patient Measurements: Weight: (!) 139.8 kg (308 lb 3.2 oz) Body mass index is 51.29 kg/m.  Recent Labs    05/22/21 0552 05/22/21 0955  WBC 10.5  --   HGB 13.7 13.1  HCT 41.4 40.0  PLT  333  --   CREATININE 0.68  --   ALBUMIN 4.0  --   PROT 7.2  --   AST 38  --   ALT 48*  --   ALKPHOS 84  --   BILITOT 0.7  --    Estimated Creatinine Clearance: 125 mL/min (by C-G formula based on SCr of 0.68 mg/dL).    Past Medical History:  Diagnosis Date   Abnormal Pap smear and cervical HPV (human papillomavirus)    Anxiety    Arthritis    BAck and sacral joint   Back pain    Colon polyps    Constipation    Gallbladder problem    Generalized anxiety disorder    GERD (gastroesophageal reflux disease)    IBS (irritable bowel syndrome)    Infertility, female    Joint pain    Multiple food allergies    Shellfish, cinnamon and yeast   Obesity    PCOS (polycystic ovarian syndrome)    Pneumonia    PONV (postoperative nausea and vomiting)    Prediabetes    Pulmonary embolism (HCC)    Seizure disorder (HCC)    Vitamin D deficiency      Medications Prior to Admission  Medication Sig Dispense Refill Last Dose   amphetamine-dextroamphetamine (ADDERALL XR) 20 MG 24 hr capsule Take 1 capsule (20  mg total) by mouth in the morning. 30 capsule 0    amphetamine-dextroamphetamine (ADDERALL) 10 MG tablet Take 1 tablet (10 mg total) by mouth daily after lunch. (Patient taking differently: Take 10 mg by mouth daily as needed (ADHD).) 30 tablet 0 Past Week   DULoxetine (CYMBALTA) 60 MG capsule Take 2 capsules (120 mg total) by mouth daily. 180 capsule 1 05/22/2021 at 0420   ELIQUIS 5 MG TABS tablet TAKE ONE TABLET BY MOUTH TWICE A DAY (Patient taking differently: Take 5 mg by mouth 2 (two) times daily.) 180 tablet 3 05/18/2021   EPINEPHrine 0.3 mg/0.3 mL IJ SOAJ injection Inject 0.3 mg into the muscle once as needed (anaphylaxis). 1 each 1    levonorgestrel (MIRENA) 20 MCG/DAY IUD 1 each by Intrauterine route once.      Melatonin 1 MG CAPS Take 3 mg by mouth at bedtime.   05/21/2021   Multiple Vitamin (MULTI-VITAMIN) tablet Take 1 tablet by mouth daily.   05/21/2021   triamcinolone  ointment (KENALOG) 0.1 % Apply 1 application topically 2 (two) times daily as needed (dry skin).      Vitamin D, Ergocalciferol, (DRISDOL) 1.25 MG (50000 UNIT) CAPS capsule Take 1 capsule (50,000 Units total) by mouth every 7 (seven) days. 12 capsule 1 05/21/2021   acetaminophen (TYLENOL) 500 MG tablet Take 500 mg by mouth every 6 (six) hours as needed for moderate pain.   More than a month   amphetamine-dextroamphetamine (ADDERALL XR) 20 MG 24 hr capsule Take 1 capsule (20 mg total) by mouth every morning. (Patient not taking: Reported on 05/08/2021) 30 capsule 0 Not Taking   amphetamine-dextroamphetamine (ADDERALL XR) 20 MG 24 hr capsule Take 1 capsule (20 mg total) by mouth every morning. (Patient not taking: Reported on 05/08/2021) 30 capsule 0 Not Taking       Napoleon Form 05/22/2021,12:11 PM

## 2021-05-22 NOTE — Transfer of Care (Signed)
Immediate Anesthesia Transfer of Care Note  Patient: Barbara Mcmahon  Procedure(s) Performed: Procedure(s): XI ROBOT ASSISTED GASTRIC SLEEVE RESECTION WITH HIATAL HERNIA REPAIR (N/A) UPPER GI ENDOSCOPY (N/A)  Patient Location: PACU  Anesthesia Type:General  Level of Consciousness:  sedated, patient cooperative and responds to stimulation  Airway & Oxygen Therapy:Patient Spontanous Breathing and Patient connected to face mask oxgen  Post-op Assessment:  Report given to PACU RN and Post -op Vital signs reviewed and stable  Post vital signs:  Reviewed and stable  Last Vitals:  Vitals:   05/22/21 0557 05/22/21 0942  BP: (!) 175/93 (!) 148/88  Pulse: 85 74  Resp: 18 18  Temp: 36.7 C   SpO2: [id number]    Complications: No apparent anesthesia complications

## 2021-05-23 ENCOUNTER — Other Ambulatory Visit (HOSPITAL_COMMUNITY): Payer: Self-pay

## 2021-05-23 ENCOUNTER — Encounter (HOSPITAL_COMMUNITY): Payer: Self-pay | Admitting: General Surgery

## 2021-05-23 LAB — COMPREHENSIVE METABOLIC PANEL
ALT: 45 U/L — ABNORMAL HIGH (ref 0–44)
AST: 29 U/L (ref 15–41)
Albumin: 3.8 g/dL (ref 3.5–5.0)
Alkaline Phosphatase: 76 U/L (ref 38–126)
Anion gap: 6 (ref 5–15)
BUN: 11 mg/dL (ref 6–20)
CO2: 26 mmol/L (ref 22–32)
Calcium: 8.7 mg/dL — ABNORMAL LOW (ref 8.9–10.3)
Chloride: 103 mmol/L (ref 98–111)
Creatinine, Ser: 0.63 mg/dL (ref 0.44–1.00)
GFR, Estimated: >60 mL/min (ref >60)
Glucose, Bld: 167 mg/dL — ABNORMAL HIGH (ref 70–99)
Potassium: 4.8 mmol/L (ref 3.5–5.1)
Sodium: 135 mmol/L (ref 135–145)
Total Bilirubin: 0.5 mg/dL (ref 0.3–1.2)
Total Protein: 6.9 g/dL (ref 6.5–8.1)

## 2021-05-23 LAB — CBC WITH DIFFERENTIAL/PLATELET
Abs Immature Granulocytes: 0.06 10*3/uL (ref 0.00–0.07)
Basophils Absolute: 0 10*3/uL (ref 0.0–0.1)
Basophils Relative: 0 %
Eosinophils Absolute: 0 10*3/uL (ref 0.0–0.5)
Eosinophils Relative: 0 %
HCT: 38.1 % (ref 36.0–46.0)
Hemoglobin: 12.3 g/dL (ref 12.0–15.0)
Immature Granulocytes: 0 %
Lymphocytes Relative: 9 %
Lymphs Abs: 1.2 10*3/uL (ref 0.7–4.0)
MCH: 29.5 pg (ref 26.0–34.0)
MCHC: 32.3 g/dL (ref 30.0–36.0)
MCV: 91.4 fL (ref 80.0–100.0)
Monocytes Absolute: 0.5 10*3/uL (ref 0.1–1.0)
Monocytes Relative: 4 %
Neutro Abs: 11.8 10*3/uL — ABNORMAL HIGH (ref 1.7–7.7)
Neutrophils Relative %: 87 %
Platelets: 331 10*3/uL (ref 150–400)
RBC: 4.17 MIL/uL (ref 3.87–5.11)
RDW: 13.1 % (ref 11.5–15.5)
WBC: 13.5 10*3/uL — ABNORMAL HIGH (ref 4.0–10.5)
nRBC: 0 % (ref 0.0–0.2)

## 2021-05-23 MED ORDER — GABAPENTIN 100 MG PO CAPS
200.0000 mg | ORAL_CAPSULE | Freq: Two times a day (BID) | ORAL | 0 refills | Status: DC
  Filled 2021-05-23: qty 20, 5d supply, fill #0

## 2021-05-23 MED ORDER — PANTOPRAZOLE SODIUM 40 MG PO TBEC
40.0000 mg | DELAYED_RELEASE_TABLET | Freq: Every day | ORAL | 0 refills | Status: DC
  Filled 2021-05-23: qty 30, 30d supply, fill #0

## 2021-05-23 MED ORDER — ENOXAPARIN SODIUM 60 MG/0.6ML IJ SOSY
60.0000 mg | PREFILLED_SYRINGE | INTRAMUSCULAR | 0 refills | Status: DC
  Filled 2021-05-23: qty 8.4, 14d supply, fill #0

## 2021-05-23 MED ORDER — AMPHETAMINE-DEXTROAMPHETAMINE 10 MG PO TABS: 10.0000 mg | ORAL_TABLET | Freq: Every day | ORAL | Status: DC | PRN

## 2021-05-23 MED ORDER — ONDANSETRON 4 MG PO TBDP
4.0000 mg | ORAL_TABLET | Freq: Four times a day (QID) | ORAL | 0 refills | Status: DC | PRN
  Filled 2021-05-23: qty 9, 3d supply, fill #0

## 2021-05-23 MED ORDER — OXYCODONE HCL 5 MG PO TABS
5.0000 mg | ORAL_TABLET | Freq: Four times a day (QID) | ORAL | 0 refills | Status: DC | PRN
  Filled 2021-05-23: qty 10, 3d supply, fill #0

## 2021-05-23 MED ORDER — ACETAMINOPHEN 500 MG PO TABS: 1000.0000 mg | ORAL_TABLET | Freq: Three times a day (TID) | ORAL | 0 refills | Status: AC

## 2021-05-23 MED ORDER — ENOXAPARIN (LOVENOX) PATIENT EDUCATION KIT
PACK | Freq: Once | Status: AC
  Filled 2021-05-23: qty 1

## 2021-05-23 NOTE — Progress Notes (Signed)
Patient alert and oriented, pain is controlled. Patient is tolerating fluids and protein shakes; patient is tolerating well.  Reviewed Gastric sleeve discharge instructions with patient and patient is able to articulate understanding.  Provided information on BELT program, Support Group and [facility] outpatient pharmacy. All questions answered, will continue to monitor until discharge.  Discharge written.  Lovenox kit in room. Meds from outpt pharmacy are being delivered.

## 2021-05-23 NOTE — Discharge Summary (Signed)
Physician Discharge Summary  Barbara Mcmahon [id number] DOB: 05/02/1976 DOA: 05/22/2021  PCP: Barbara Marry, MD  Admit date: 05/22/2021 Discharge date: 05/23/2021  Recommendations for Outpatient Follow-up:    Follow-up Information     Barbara Pickerel, MD Follow up on 06/15/2021.   Specialty: General Surgery Why: @ 9:30am w/ Dr. Cyndi Mcmahon information: [address] [place]  [address] [phone]         Barbara Pickerel, MD .   Specialty: General Surgery Contact information: [place] [address] [phone]                Discharge Diagnoses:  Principal Problem:   Severe obesity ([place]) Active Problems:   PCOS (polycystic ovarian syndrome)   Family history of hypertrophic cardiomyopathy   History of pulmonary embolism   Prediabetes   Dyslipidemia   S/P laparoscopic sleeve gastrectomy   Surgical Procedure: robotic Sleeve Gastrectomy, upper endoscopy  Discharge Condition: Good Disposition: Home  Diet recommendation: Postoperative sleeve gastrectomy diet (liquids only)  Filed Weights   05/22/21 0557 05/22/21 1410  Weight: (!) 139.8 kg (!) 139.8 kg     Hospital Course:  The patient was admitted for a planned robotic sleeve gastrectomy. Please see operative note. Preoperatively the patient was given 5000 units of subcutaneous heparin for DVT prophylaxis. Postoperative prophylactic Lovenox dosing was started on the evening of postoperative day 0. ERAS protocol was used. On the evening of postoperative day 0, the patient was started on water and ice chips. On postoperative day 1 the patient had no fever or tachycardia and was tolerating water in their diet was gradually advanced throughout the day. The patient was ambulating without difficulty. Their vital signs are stable without fever or tachycardia. Their hemoglobin had remained stable. The patient had received discharge instructions and counseling. They were  deemed stable for discharge and had met discharge criteria  Because of her history of pulmonary embolism she was going to be sent out on 60 mg of Lovenox once a day for 2 weeks and then converted back to her twice daily home dose of Eliquis.  She had done Lovenox injections in the past.  BP (!) 162/67 (BP Location: Left Arm)   Pulse 68   Temp 98.2 F (36.8 C) (Oral)   Resp 18   Ht 5' 5" (1.651 m)   Wt (!) 139.8 kg   SpO2 97%   BMI 51.29 kg/m   Gen: alert, NAD, non-toxic appearing Pupils: equal, no scleral icterus Pulm: Lungs clear to auscultation, symmetric chest rise CV: regular rate and rhythm Abd: soft, mild approp tender, nondistended.  No cellulitis. No incisional hernia Ext: no edema, no calf tenderness Skin: no rash, no jaundice    Discharge Instructions  Discharge Instructions     Ambulate hourly while awake   Complete by: As directed    Call MD for:  difficulty breathing, headache or visual disturbances   Complete by: As directed    Call MD for:  persistant dizziness or light-headedness   Complete by: As directed    Call MD for:  persistant nausea and vomiting   Complete by: As directed    Call MD for:  redness, tenderness, or signs of infection (pain, swelling, redness, odor or green/yellow discharge around incision site)   Complete by: As directed    Call MD for:  severe uncontrolled pain   Complete by: As directed    Call MD for:  temperature >101 F  Complete by: As directed    Diet bariatric full liquid   Complete by: As directed    Discharge instructions   Complete by: As directed    See bariatric discharge instructions   Incentive spirometry   Complete by: As directed    Perform hourly while awake      Allergies as of 05/23/2021       Reactions   Cinnamon Shortness Of Breath, Rash   Wheezing   Guaifenesin-codeine Itching   Morphine Itching, Rash, Hives   Other reaction(s): Other (See Comments) BEHAVIOR CHANGES Big mood changes BEHAVIOR  CHANGES irritable   Nutmeg Oil (myristica Oil) Shortness Of Breath, Rash   wheezing   Shellfish Allergy Hives, Swelling, Anaphylaxis   Throat closes   Shrimp Extract Allergy Skin Test    Other reaction(s): Wheezing   Codeine Hives, Itching   Pt says she can take percocet   Clonazepam Itching   Menthol Hives   Robitussin A-c [guaifenesin-codeine] Itching   Tramadol Itching   Yeast Hives        Medication List     TAKE these medications    acetaminophen 500 MG tablet Commonly known as: TYLENOL Take 2 tablets (1,000 mg total) by mouth every 8 (eight) hours for 5 days. What changed:  how much to take when to take this reasons to take this   amphetamine-dextroamphetamine 10 MG tablet Commonly known as: Adderall Take 1 tablet (10 mg total) by mouth daily as needed (ADHD). What changed:  when to take this reasons to take this Another medication with the same name was removed. Continue taking this medication, and follow the directions you see here.   DULoxetine 60 MG capsule Commonly known as: CYMBALTA Take 2 capsules (120 mg total) by mouth daily.   Eliquis 5 MG Tabs tablet Generic drug: apixaban TAKE ONE TABLET BY MOUTH TWICE A DAY What changed: how much to take   enoxaparin 60 MG/0.6ML injection Commonly known as: LOVENOX Inject 0.6 mLs (60 mg total) into the skin daily for 14 days. Start taking on: May 24, 2021   EPINEPHrine 0.3 mg/0.3 mL Soaj injection Commonly known as: EPI-PEN Inject 0.3 mg into the muscle once as needed (anaphylaxis).   gabapentin 100 MG capsule Commonly known as: NEURONTIN Take 2 capsules (200 mg total) by mouth every 12 (twelve) hours.   levonorgestrel 20 MCG/DAY Iud Commonly known as: MIRENA 1 each by Intrauterine route once.   Melatonin 1 MG Caps Take 3 mg by mouth at bedtime.   Multi-Vitamin tablet Take 1 tablet by mouth daily.   ondansetron 4 MG disintegrating tablet Commonly known as: ZOFRAN-ODT Dissolve 1 tablet (4  mg total) in mouth every 6 (six) hours as needed for nausea or vomiting.   oxyCODONE 5 MG immediate release tablet Commonly known as: Oxy IR/ROXICODONE Take 1 tablet (5 mg total) by mouth every 6 (six) hours as needed for severe pain.   pantoprazole 40 MG tablet Commonly known as: PROTONIX Take 1 tablet (40 mg total) by mouth daily.   triamcinolone ointment 0.1 % Commonly known as: KENALOG Apply 1 application topically 2 (two) times daily as needed (dry skin).   Vitamin D (Ergocalciferol) 1.25 MG (50000 UNIT) Caps capsule Commonly known as: DRISDOL Take 1 capsule (50,000 Units total) by mouth every 7 (seven) days.        Follow-up Information     Barbara Pickerel, MD Follow up on 06/15/2021.   Specialty: General Surgery Why: @ 9:30am w/ Dr. Cyndi Mcmahon  information: [address] [place] [address] [phone]         Barbara Pickerel, MD .   Specialty: General Surgery Contact information: [place] [address] [phone]                  The results of significant diagnostics from this hospitalization (including imaging, microbiology, ancillary and laboratory) are listed below for reference.    Significant Diagnostic Studies: No results found.  Labs: Basic Metabolic Panel: Recent Labs  Lab 05/22/21 0552 05/23/21 0325  NA 137 135  K 3.6 4.8  CL 106 103  CO2 25 26  GLUCOSE 134* 167*  BUN 12 11  CREATININE 0.68 0.63  CALCIUM 8.8* 8.7*   Liver Function Tests: Recent Labs  Lab 05/22/21 0552 05/23/21 0325  AST 38 29  ALT 48* 45*  ALKPHOS 84 76  BILITOT 0.7 0.5  PROT 7.2 6.9  ALBUMIN 4.0 3.8    CBC: Recent Labs  Lab 05/22/21 0552 05/22/21 0955 05/23/21 0325  WBC 10.5  --  13.5*  NEUTROABS  --   --  11.8*  HGB 13.7 13.1 12.3  HCT 41.4 40.0 38.1  MCV 89.2  --  91.4  PLT 333  --  331    CBG: No results for input(s): GLUCAP in the last 168 hours.  Principal Problem:   Severe obesity  ([place]) Active Problems:   PCOS (polycystic ovarian syndrome)   Family history of hypertrophic cardiomyopathy   History of pulmonary embolism   Prediabetes   Dyslipidemia   S/P laparoscopic sleeve gastrectomy   Time coordinating discharge: 25 min  Signed:  Gayland Curry, MD [facility], [place] [phone] 05/23/2021, 1:52 PM

## 2021-05-23 NOTE — Discharge Instructions (Addendum)
RESUME ELIQUIS ON 8/31 - the day after you last dose of lovenox. Do not take lovenox and eliquis on the same day        GASTRIC BYPASS/SLEEVE  Home Care Instructions   These instructions are to help you care for yourself when you go home.  Call: If you have any problems. Call [phone] and ask for the surgeon on call If you need immediate help, come to the ER at [facility].  Tell the ER staff that you are a new post-op gastric bypass or gastric sleeve patient   Signs and symptoms to report: Severe vomiting or nausea If you cannot keep down clear liquids for longer than 1 day, call your surgeon  Abdominal pain that does not get better after taking your pain medication Fever over 100.4 F with chills Heart beating over 100 beats a minute Shortness of breath at rest Chest pain  Redness, swelling, drainage, or foul odor at incision (surgical) sites  If your incisions open or pull apart Swelling or pain in calf (lower leg) Diarrhea (Loose bowel movements that happen often), frequent watery, uncontrolled bowel movements Constipation, (no bowel movements for 3 days) if this happens: Pick one Milk of Magnesia, 2 tablespoons by mouth, 3 times a day for 2 days if needed Stop taking Milk of Magnesia once you have a bowel movement Call your doctor if constipation continues Or Miralax  (instead of Milk of Magnesia) following the label instructions Stop taking Miralax once you have a bowel movement Call your doctor if constipation continues Anything you think is not normal   Normal side effects after surgery: Unable to sleep at night or unable to focus Irritability or moody Being tearful (crying) or depressed These are common complaints, possibly related to your anesthesia medications that put you to sleep, stress of surgery, and change in lifestyle.  This usually goes away a few weeks after surgery.  If these feelings continue, call your primary care doctor.   Wound Care: You may  have surgical glue, steri-strips, or staples over your incisions after surgery Surgical glue:  Looks like a clear film over your incisions and will wear off a little at a time Steri-strips: Strips of tape over your incisions. You may notice a yellowish color on the skin under the steri-strips. This is used to make the   steri-strips stick better. Do not pull the steri-strips off - let them fall off Staples: Staples may be removed before you leave the hospital If you go home with staples, call [facility], [phone]) [phone] at for an appointment with your surgeon's nurse to have staples removed 10 days after surgery. Showering: You may shower two (2) days after your surgery unless your surgeon tells you differently Wash gently around incisions with warm soapy water, rinse well, and gently pat dry  No tub baths until staples are removed, steri-strips fall off or glue is gone.    Medications: Medications should be liquid or crushed if larger than the size of a dime Extended release pills (medication that release a little bit at a time through the day) should NOT be crushed or cut. (examples include XL, ER, DR, SR) Depending on the size and number of medications you take, you may need to space (take a few throughout the day)/change the time you take your medications so that you do not over-fill your pouch (smaller stomach) Make sure you follow-up with your primary care doctor to make medication changes needed during rapid weight loss and  life-style changes If you have diabetes, follow up with the doctor that orders your diabetes medication(s) within one week after surgery and check your blood sugar regularly. Do not drive while taking prescription pain medication  It is ok to take Tylenol by the bottle instructions with your pain medicine or instead of your pain medicine as needed.  DO NOT TAKE NSAIDS (EXAMPLES OF NSAIDS:  IBUPROFREN/ NAPROXEN)  Diet:                    First 2 Weeks  You will  see the dietician t about two (2) weeks after your surgery. The dietician will increase the types of foods you can eat if you are handling liquids well: If you have severe vomiting or nausea and cannot keep down clear liquids lasting longer than 1 day, call your surgeon @ [phone]) Protein Shake Drink at least 2 ounces of shake 5-6 times per day Each serving of protein shakes (usually 8 - 12 ounces) should have: 15 grams of protein  And no more than 5 grams of carbohydrate  Goal for protein each day: Men = 80 grams per day Women = 60 grams per day Protein powder may be added to fluids such as non-fat milk or Lactaid milk or unsweetened Soy/Almond milk (limit to 35 grams added protein powder per serving)  Hydration Slowly increase the amount of water and other clear liquids as tolerated (See Acceptable Fluids) Slowly increase the amount of protein shake as tolerated   Sip fluids slowly and throughout the day.  Do not use straws. May use sugar substitutes in small amounts (no more than 6 - 8 packets per day; i.e. Splenda)  Fluid Goal The first goal is to drink at least 8 ounces of protein shake/drink per day (or as directed by the nutritionist); some examples of protein shakes are Johnson [employer] Johnson, AMR Corporation, EAS Edge HP, and Unjury. See handout from pre-op Bariatric Education Class: Slowly increase the amount of protein shake you drink as tolerated You may find it easier to slowly sip shakes throughout the day It is important to get your proteins in first Your fluid goal is to drink 64 - 100 ounces of fluid daily It may take a few weeks to build up to this 32 oz (or more) should be clear liquids  And  32 oz (or more) should be full liquids (see below for examples) Liquids should not contain sugar, caffeine, or carbonation  Clear Liquids: Water or Sugar-free flavored water (i.e. Fruit H2O, Propel) Decaffeinated coffee or tea (sugar-free) Crystal Lite, Wyler's Lite, Minute  Maid Lite Sugar-free Jell-O Bouillon or broth Sugar-free Popsicle:   *Less than 20 calories each; Limit 1 per day  Full Liquids: Protein Shakes/Drinks + 2 choices per day of other full liquids Full liquids must be: No More Than 15 grams of Carbs per serving  No More Than 3 grams of Fat per serving Strained low-fat cream soup (except Cream of Potato or Tomato) Non-Fat milk Fat-free Lactaid Milk Unsweetened Soy Or Unsweetened Almond Milk Low Sugar yogurt (Dannon Lite & Fit, Greek yogurt; Oikos Triple Zero; Chobani Simply 100; Yoplait 100 calorie [place] - No Fruit on the Bottom)    Vitamins and Minerals Start 1 day after surgery unless otherwise directed by your surgeon 2 Chewable Bariatric Specific Multivitamin / Multimineral Supplement with iron (Example: Bariatric Advantage Multi EA) Chewable Calcium with Vitamin D-3 (Example: 3 Chewable Calcium Plus 600 with Vitamin D-3) Take 500 mg three (3) times  a day for a total of 1500 mg each day Do not take all 3 doses of calcium at one time as it may cause constipation, and you can only absorb 500 mg  at a time  Do not mix multivitamins containing iron with calcium supplements; take 2 hours apart Menstruating women and those with a history of anemia (a blood disease that causes weakness) may need extra iron Talk with your doctor to see if you need more iron Do not stop taking or change any vitamins or minerals until you talk to your dietitian or surgeon Your Dietitian and/or surgeon must approve all vitamin and mineral supplements   Activity and Exercise: Limit your physical activity as instructed by your doctor.  It is important to continue walking at home.  During this time, use these guidelines: Do not lift anything greater than ten (10) pounds for at least two (2) weeks Do not go back to work or drive until Engineer, production says you can You may have sex when you feel comfortable  It is VERY important for female patients to use a reliable  birth control method; fertility often increases after surgery  All hormonal birth control will be ineffective for 30 days after surgery due to medications given during surgery a barrier method must be used. Do not get pregnant for at least 18 months Start exercising as soon as your doctor tells you that you can Make sure your doctor approves any physical activity Start with a simple walking program Walk 5-15 minutes each day, 7 days per week.  Slowly increase until you are walking 30-45 minutes per day Consider joining our [place] program. [phone] or email [email or web address]   Special Instructions Things to remember: Use your CPAP when sleeping if this applies to you  [facility] has two free Bariatric Surgery Support Groups that meet monthly The 3rd Thursday of each month, 6 pm, [place]  The 2nd Friday of each month, 11:45 am in the private dining room in the basement of [place] It is very important to keep all follow up appointments with your surgeon, dietitian, primary care physician, and behavioral health practitioner Routine follow up schedule with your surgeon include appointments at 2-3 weeks, 6-8 weeks, 6 months, and 1 year at a minimum.  Your surgeon may request to see you more often.   After the first year, please follow up with your bariatric surgeon and dietitian at least once a year in order to maintain best weight loss results [facility]: [place]: [phone] Bariatric Nurse Coordinator: [phone]      Reviewed and Endorsed  by [facility] Patient Education Committee, June, 2016 Edits Approved: Aug, 2018

## 2021-05-23 NOTE — Progress Notes (Signed)
Nutrition Education Note ° °Received consult for diet education for patient s/p bariatric surgery. ° °Discussed 2 week post op diet with pt. Emphasized that liquids must be non carbonated, non caffeinated, and sugar free. Fluid goals discussed. Pt to follow up with outpatient bariatric RD for further diet progression after 2 weeks. Multivitamins and minerals also reviewed. Teach back method used, pt expressed understanding, expect good compliance. ° °If nutrition issues arise, please consult RD. ° °Fanta Wimberley, MS, RD, LDN °Inpatient Clinical Dietitian °Contact information available via Amion ° ° °

## 2021-05-24 ENCOUNTER — Telehealth: Payer: Self-pay | Admitting: General Practice

## 2021-05-24 NOTE — Telephone Encounter (Signed)
Transition Care Management Unsuccessful Follow-up Telephone Call  Date of discharge and from where:  05/23/21 from [facility]  Attempts:  1st Attempt  Reason for unsuccessful TCM follow-up call:  Left voice message

## 2021-05-25 LAB — SURGICAL PATHOLOGY

## 2021-05-25 NOTE — Telephone Encounter (Signed)
Transition Care Management Unsuccessful Follow-up Telephone Call  Date of discharge and from where:  05/23/2021 from [facility]  Attempts:  2nd Attempt  Reason for unsuccessful TCM follow-up call:  Left voice message

## 2021-05-26 ENCOUNTER — Ambulatory Visit (INDEPENDENT_AMBULATORY_CARE_PROVIDER_SITE_OTHER): Payer: 59 | Admitting: Family Medicine

## 2021-05-26 ENCOUNTER — Ambulatory Visit: Payer: Self-pay

## 2021-05-26 ENCOUNTER — Other Ambulatory Visit: Payer: Self-pay

## 2021-05-26 VITALS — Ht 65.0 in | Wt 305.0 lb

## 2021-05-26 DIAGNOSIS — M722 Plantar fascial fibromatosis: Secondary | ICD-10-CM | POA: Diagnosis not present

## 2021-05-26 DIAGNOSIS — M25571 Pain in right ankle and joints of right foot: Secondary | ICD-10-CM

## 2021-05-26 NOTE — Progress Notes (Signed)
Barbara Mcmahon - 45 y.o. female MRN [id number]  Date of birth: 08/03/1976  SUBJECTIVE:  Including CC & ROS.  No chief complaint on file.   Barbara Mcmahon is a 45 y.o. female that is presenting with right foot pain.  Occurs at the plantar aspect of the foot.  Worse with the first few steps in the morning..    Review of Systems See HPI   HISTORY: Past Medical, Surgical, Social, and Family History Reviewed & Updated per EMR.   Pertinent Historical Findings include:  Past Medical History:  Diagnosis Date   Abnormal Pap smear and cervical HPV (human papillomavirus)    Anxiety    Arthritis    BAck and sacral joint   Back pain    Colon polyps    Constipation    Gallbladder problem    Generalized anxiety disorder    GERD (gastroesophageal reflux disease)    IBS (irritable bowel syndrome)    Infertility, female    Joint pain    Multiple food allergies    Shellfish, cinnamon and yeast   Obesity    PCOS (polycystic ovarian syndrome)    Pneumonia    PONV (postoperative nausea and vomiting)    Prediabetes    Pulmonary embolism (HCC)    Seizure disorder (HCC)    Vitamin D deficiency     Past Surgical History:  Procedure Laterality Date   BREAST BIOPSY Right 2019   benign   CHOLECYSTECTOMY     COLPOSCOPY     DILATION AND CURETTAGE OF UTERUS     GYNECOLOGIC CRYOSURGERY     KNEE SURGERY  right   UPPER GI ENDOSCOPY N/A 05/22/2021   Procedure: UPPER GI ENDOSCOPY;  Surgeon: [name], MD;  Location: [facility];  Service: General;  Laterality: N/A;    Family History  Problem Relation Age of Onset   Hypertension Father    Depression Father    Anxiety disorder Father    Sleep apnea Father    Obesity Father    Rheum arthritis Father    Diabetes Paternal Grandmother    Hypertension Paternal Grandmother    Stroke Paternal Grandmother    Diabetes Paternal Grandfather    Hypertension Paternal Grandfather    Stroke Paternal Grandfather    Hypertension Mother    Depression  Mother    Anxiety disorder Mother    Eating disorder Mother    Rheum arthritis Mother     Social History   Socioeconomic History   Marital status: Married    Spouse name: Aleisa Saulino   Number of children: 2   Years of education: Not on file   Highest education level: Not on file  Occupational History   Occupation: Art gallery manager  Tobacco Use   Smoking status: Never   Smokeless tobacco: Never  Vaping Use   Vaping Use: Never used  Substance and Sexual Activity   Alcohol use: No   Drug use: No   Sexual activity: Not Currently  Other Topics Concern   Not on file  Social History Narrative   Not on file   Social Determinants of Health   Financial Resource Strain: Not on file  Food Insecurity: Not on file  Transportation Needs: Not on file  Physical Activity: Not on file  Stress: Not on file  Social Connections: Not on file  Intimate Partner Violence: Not on file     PHYSICAL EXAM:  VS: Ht '5\' 5"'$  (1.651 m)   Wt (!) 305 lb (138.3 kg)  BMI 50.75 kg/m  Physical Exam Gen: NAD, alert, cooperative with exam, well-appearing MSK:  Right foot: Tenderness palpation of the calcaneal plantar aspect. No swelling or ecchymosis. Fairly high arch. Neurovascular intact  Limited ultrasound: Right foot, left foot:  No significant thickening of the plantar fascia and no hypoechoic change. Mild appearing calcaneal plantar spur  Summary: No significant structural changes  Ultrasound and interpretation by Clearance Coots, MD    ASSESSMENT & PLAN:   Plantar fasciitis, bilateral Symptoms most consistent with Planter fasciitis.   -Counseled on home exercise therapy and supportive care. -Midfoot arch straps. -Pursue shockwave therapy. -Could consider physical therapy or injection.

## 2021-05-26 NOTE — Telephone Encounter (Signed)
Transition Care Management Unsuccessful Follow-up Telephone Call  Date of discharge and from where:  05/23/2021 from [facility]  Attempts:  3rd Attempt  Reason for unsuccessful TCM follow-up call:  Unable to reach patient

## 2021-05-26 NOTE — Assessment & Plan Note (Signed)
Symptoms most consistent with Planter fasciitis.   -Counseled on home exercise therapy and supportive care. -Midfoot arch straps. -Pursue shockwave therapy. -Could consider physical therapy or injection.

## 2021-05-26 NOTE — Patient Instructions (Signed)
Good to see you Please try the stretches  Please try ice  Please try the straps   Please send me a message in MyChart with any questions or updates.  Please see me back to start shockwave therapy .   --Dr. Raeford Razor

## 2021-05-29 ENCOUNTER — Telehealth (HOSPITAL_COMMUNITY): Payer: Self-pay

## 2021-05-29 ENCOUNTER — Telehealth: Payer: Self-pay | Admitting: Skilled Nursing Facility1

## 2021-05-29 NOTE — Telephone Encounter (Signed)
Patient called to discuss post bariatric surgery follow up questions. No answer at follow up call, contact information provided for return call.  Await return call to answer below questions.     1.  Tell me about your pain and pain management?  2.  Let's talk about fluid intake.  How much total fluid are you taking in?  3.  How much protein have you taken in the last 2 days?  4.  Have you had nausea?  Tell me about when have experienced nausea and what you did to help?  5.  Has the frequency or color changed with your urine?  6.  Tell me what your incisions look like?  7.  Have you been passing gas? BM?  8.  If a problem or question were to arise who would you call?  Do you know contact numbers for [place], [facility], and [facility]?  9.  How has the walking going?  10.  How are your vitamins and calcium going?  How are you taking them?

## 2021-05-29 NOTE — Telephone Encounter (Signed)
You can drink protein and fluid so you do not have to stop at the minimum requirement. You can also try low fat strained cream soup. Hang in there you can do it!!   Remember you are avoiding solid foods for you health and safety.   From: Barbara Mcmahon [email or web address]>  Sent: Saturday, May 27, 2021 7:58 PM To: Puneet Masoner [email or web address]> Subject: Post Op Question  *Caution - External email - see footer for warnings* Hi Barbara Mcmahon,   Hope you had a nice weekend. I wanted to reach out regarding the post op diet. I am currently day 5 and my stomach is growling non-stop. I am so hungry. I am meeting both my protein and fluid goals. Do you have any tips to help my body feel more satisfied?   Thank you, Barbara Mcmahon

## 2021-05-31 ENCOUNTER — Ambulatory Visit: Payer: 59 | Admitting: Family Medicine

## 2021-05-31 NOTE — Progress Notes (Deleted)
Tyneesha Morrison - 45 y.o. female MRN [id number]  Date of birth: 07/03/1976  SUBJECTIVE:  Including CC & ROS.  No chief complaint on file.   Miceala Lettiere is a 45 y.o. female that is  ***.  ***   Review of Systems See HPI   HISTORY: Past Medical, Surgical, Social, and Family History Reviewed & Updated per EMR.   Pertinent Historical Findings include:  Past Medical History:  Diagnosis Date   Abnormal Pap smear and cervical HPV (human papillomavirus)    Anxiety    Arthritis    BAck and sacral joint   Back pain    Colon polyps    Constipation    Gallbladder problem    Generalized anxiety disorder    GERD (gastroesophageal reflux disease)    IBS (irritable bowel syndrome)    Infertility, female    Joint pain    Multiple food allergies    Shellfish, cinnamon and yeast   Obesity    PCOS (polycystic ovarian syndrome)    Pneumonia    PONV (postoperative nausea and vomiting)    Prediabetes    Pulmonary embolism (HCC)    Seizure disorder (HCC)    Vitamin D deficiency     Past Surgical History:  Procedure Laterality Date   BREAST BIOPSY Right 2019   benign   CHOLECYSTECTOMY     COLPOSCOPY     DILATION AND CURETTAGE OF UTERUS     GYNECOLOGIC CRYOSURGERY     KNEE SURGERY  right   UPPER GI ENDOSCOPY N/A 05/22/2021   Procedure: UPPER GI ENDOSCOPY;  Surgeon: [name], MD;  Location: [facility];  Service: General;  Laterality: N/A;    Family History  Problem Relation Age of Onset   Hypertension Father    Depression Father    Anxiety disorder Father    Sleep apnea Father    Obesity Father    Rheum arthritis Father    Diabetes Paternal Grandmother    Hypertension Paternal Grandmother    Stroke Paternal Grandmother    Diabetes Paternal Grandfather    Hypertension Paternal Grandfather    Stroke Paternal Grandfather    Hypertension Mother    Depression Mother    Anxiety disorder Mother    Eating disorder Mother    Rheum arthritis Mother     Social History    Socioeconomic History   Marital status: Married    Spouse name: Yuliana Pendergast   Number of children: 2   Years of education: Not on file   Highest education level: Not on file  Occupational History   Occupation: Art gallery manager  Tobacco Use   Smoking status: Never   Smokeless tobacco: Never  Vaping Use   Vaping Use: Never used  Substance and Sexual Activity   Alcohol use: No   Drug use: No   Sexual activity: Not Currently  Other Topics Concern   Not on file  Social History Narrative   Not on file   Social Determinants of Health   Financial Resource Strain: Not on file  Food Insecurity: Not on file  Transportation Needs: Not on file  Physical Activity: Not on file  Stress: Not on file  Social Connections: Not on file  Intimate Partner Violence: Not on file     PHYSICAL EXAM:  VS: There were no vitals taken for this visit. Physical Exam Gen: NAD, alert, cooperative with exam, well-appearing MSK:  ***      ASSESSMENT & PLAN:   No problem-specific Assessment &  Plan notes found for this encounter.

## 2021-06-01 ENCOUNTER — Encounter: Payer: Self-pay | Admitting: Family Medicine

## 2021-06-01 ENCOUNTER — Other Ambulatory Visit: Payer: Self-pay

## 2021-06-01 ENCOUNTER — Ambulatory Visit (INDEPENDENT_AMBULATORY_CARE_PROVIDER_SITE_OTHER): Payer: 59 | Admitting: Family Medicine

## 2021-06-01 VITALS — BP 133/67 | HR 109 | Temp 97.9°F | Ht 65.0 in | Wt 295.0 lb

## 2021-06-01 DIAGNOSIS — Z23 Encounter for immunization: Secondary | ICD-10-CM | POA: Diagnosis not present

## 2021-06-01 DIAGNOSIS — R21 Rash and other nonspecific skin eruption: Secondary | ICD-10-CM | POA: Diagnosis not present

## 2021-06-01 DIAGNOSIS — L659 Nonscarring hair loss, unspecified: Secondary | ICD-10-CM | POA: Diagnosis not present

## 2021-06-01 DIAGNOSIS — Z903 Acquired absence of stomach [part of]: Secondary | ICD-10-CM

## 2021-06-01 DIAGNOSIS — Z9884 Bariatric surgery status: Secondary | ICD-10-CM

## 2021-06-01 NOTE — Assessment & Plan Note (Signed)
She has had some thinning of hair on the top of her hea, after having had COVID discussed that this is not unusual we have had quite a few patients reporting this.  And then also having bariatric surgery can cause some transient hair loss as well.  Recommend trial of Women's Rogaine.  And then hopefully over the next 6 months she will start to get some new hair growth in place.  He is not planning on getting pregnant.  Did warn against getting pregnant while on Rogaine.

## 2021-06-01 NOTE — Progress Notes (Signed)
Established Patient Office Visit  Subjective:  Patient ID: Barbara Mcmahon, female    DOB: 02/06/1976  Age: 45 y.o. MRN: [id number]  CC:  Chief Complaint  Patient presents with   Hospitalization Follow-up   Gastric Sleeve    HPI Barbara Mcmahon presents for follow-up recent hospitalization for gastric sleeve surgery.  She just had surgery last Monday she is actually doing really well overall.  She has not had any significant pain or problems in fact she did even end up using her pain medication.  She would like me to check her wounds today she does have a follow-up coming up next month.  She says she is no longer even using Tylenol.  She is doing okay with swallowing her Cymbalta capsules she is just been spacing them to get them all and so they do not get obstructed.  He is concerned because she is noticed some hair loss.  She says really since she had COVID.  She feels like it is getting a little more thin on the top of her head and wants to know the best treatment options.  He is also developed a rash in her axilla and around her umbilicus since surgery.  She is also had a little bit more low right and left lower pelvic abdominal pressure.  Bowels are moving normally.  She is still on a liquid diet and will transition to pure hopefully next week.  With a nutritionist at that point.  Past Medical History:  Diagnosis Date   Abnormal Pap smear and cervical HPV (human papillomavirus)    Anxiety    Arthritis    BAck and sacral joint   Back pain    Colon polyps    Constipation    Gallbladder problem    Generalized anxiety disorder    GERD (gastroesophageal reflux disease)    IBS (irritable bowel syndrome)    Infertility, female    Joint pain    Multiple food allergies    Shellfish, cinnamon and yeast   Obesity    PCOS (polycystic ovarian syndrome)    Pneumonia    PONV (postoperative nausea and vomiting)    Prediabetes    Pulmonary embolism (HCC)    Seizure disorder (HCC)     Vitamin D deficiency     Past Surgical History:  Procedure Laterality Date   BREAST BIOPSY Right 2019   benign   CHOLECYSTECTOMY     COLPOSCOPY     DILATION AND CURETTAGE OF UTERUS     GYNECOLOGIC CRYOSURGERY     KNEE SURGERY  right   UPPER GI ENDOSCOPY N/A 05/22/2021   Procedure: UPPER GI ENDOSCOPY;  Surgeon: [name], MD;  Location: [facility];  Service: General;  Laterality: N/A;    Family History  Problem Relation Age of Onset   Hypertension Father    Depression Father    Anxiety disorder Father    Sleep apnea Father    Obesity Father    Rheum arthritis Father    Diabetes Paternal Grandmother    Hypertension Paternal Grandmother    Stroke Paternal Grandmother    Diabetes Paternal Grandfather    Hypertension Paternal Grandfather    Stroke Paternal Grandfather    Hypertension Mother    Depression Mother    Anxiety disorder Mother    Eating disorder Mother    Rheum arthritis Mother     Social History   Socioeconomic History   Marital status: Married    Spouse name: Charrie Eiken  Number of children: 2   Years of education: Not on file   Highest education level: Not on file  Occupational History   Occupation: Art gallery manager  Tobacco Use   Smoking status: Never   Smokeless tobacco: Never  Vaping Use   Vaping Use: Never used  Substance and Sexual Activity   Alcohol use: No   Drug use: No   Sexual activity: Not Currently  Other Topics Concern   Not on file  Social History Narrative   Not on file   Social Determinants of Health   Financial Resource Strain: Not on file  Food Insecurity: Not on file  Transportation Needs: Not on file  Physical Activity: Not on file  Stress: Not on file  Social Connections: Not on file  Intimate Partner Violence: Not on file    Outpatient Medications Prior to Visit  Medication Sig Dispense Refill   amphetamine-dextroamphetamine (ADDERALL) 10 MG tablet Take 1 tablet (10 mg total) by mouth daily  as needed (ADHD).     DULoxetine (CYMBALTA) 60 MG capsule Take 2 capsules (120 mg total) by mouth daily. 180 capsule 1   ELIQUIS 5 MG TABS tablet TAKE ONE TABLET BY MOUTH TWICE A DAY (Patient taking differently: Take 5 mg by mouth 2 (two) times daily.) 180 tablet 3   EPINEPHrine 0.3 mg/0.3 mL IJ SOAJ injection Inject 0.3 mg into the muscle once as needed (anaphylaxis). 1 each 1   levonorgestrel (MIRENA) 20 MCG/DAY IUD 1 each by Intrauterine route once.     Melatonin 1 MG CAPS Take 3 mg by mouth at bedtime.     Multiple Vitamin (MULTI-VITAMIN) tablet Take 1 tablet by mouth daily.     pantoprazole (PROTONIX) 40 MG tablet Take 1 tablet (40 mg total) by mouth daily. 90 tablet 0   triamcinolone ointment (KENALOG) 0.1 % Apply 1 application topically 2 (two) times daily as needed (dry skin).     Vitamin D, Ergocalciferol, (DRISDOL) 1.25 MG (50000 UNIT) CAPS capsule Take 1 capsule (50,000 Units total) by mouth every 7 (seven) days. 12 capsule 1   enoxaparin (LOVENOX) 60 MG/0.6ML injection Inject 0.6 mLs (60 mg total) into the skin daily for 14 days. (Patient not taking: Reported on 06/01/2021) 8.4 mL 0   gabapentin (NEURONTIN) 100 MG capsule Take 2 capsules (200 mg total) by mouth every 12 (twelve) hours. 20 capsule 0   ondansetron (ZOFRAN-ODT) 4 MG disintegrating tablet Dissolve 1 tablet (4 mg total) in mouth every 6 (six) hours as needed for nausea or vomiting. 20 tablet 0   oxyCODONE (OXY IR/ROXICODONE) 5 MG immediate release tablet Take 1 tablet (5 mg total) by mouth every 6 (six) hours as needed for severe pain. 10 tablet 0   No facility-administered medications prior to visit.    Allergies  Allergen Reactions   Cinnamon Shortness Of Breath and Rash    Wheezing    Guaifenesin-Codeine Itching   Morphine Itching, Rash and Hives    Other reaction(s): Other (See Comments) BEHAVIOR CHANGES Big mood changes BEHAVIOR CHANGES irritable   Nutmeg Oil (Myristica Oil) Shortness Of Breath and Rash     wheezing   Shellfish Allergy Hives, Swelling and Anaphylaxis    Throat closes   Shrimp Extract Allergy Skin Test     Other reaction(s): Wheezing   Codeine Hives and Itching    Pt says she can take percocet   Clonazepam Itching   Menthol Hives   Robitussin A-C [Guaifenesin-Codeine] Itching   Tramadol Itching  Yeast Hives    ROS Review of Systems    Objective:    Physical Exam Vitals reviewed.  Constitutional:      Appearance: She is well-developed.  HENT:     Head: Normocephalic and atraumatic.  Eyes:     Conjunctiva/sclera: Conjunctivae normal.  Cardiovascular:     Rate and Rhythm: Normal rate.  Pulmonary:     Effort: Pulmonary effort is normal.  Abdominal:     General: Abdomen is flat.     Palpations: Abdomen is soft.     Comments: No significant tenderness over the lower abdomen.  No mass or swelling.  Skin:    General: Skin is dry.     Coloration: Skin is not pale.     Comments: All the incisions on her abdomen are clean and intact.  She does have some significant bruising but no erythema no induration.  All the bandages were removed and Steri-Strips were left in place.  Encouraged her to just continue to leave those until they fall off on their own.  Okay to get wet in the shower.  She does have some mild erythema under both axilla.  It is little scattered not consistent with yeast.  Right around her umbilicus she has some erythema and thick scaling.  No moisture or drainage.  Neurological:     Mental Status: She is alert and oriented to person, place, and time.  Psychiatric:        Behavior: Behavior normal.    BP 133/67   Pulse (!) 109   Temp 97.9 F (36.6 C)   Ht '5\' 5"'$  (1.651 m)   Wt 295 lb (133.8 kg)   SpO2 100%   BMI 49.09 kg/m  Wt Readings from Last 3 Encounters:  06/01/21 295 lb (133.8 kg)  05/26/21 (!) 305 lb (138.3 kg)  05/22/21 (!) 308 lb 3.2 oz (139.8 kg)     There are no preventive care reminders to display for this patient.  There are  no preventive care reminders to display for this patient.  Lab Results  Component Value Date   TSH 1.84 12/22/2020   Lab Results  Component Value Date   WBC 13.5 (H) 05/23/2021   HGB 12.3 05/23/2021   HCT 38.1 05/23/2021   MCV 91.4 05/23/2021   PLT 331 05/23/2021   Lab Results  Component Value Date   NA 135 05/23/2021   K 4.8 05/23/2021   CO2 26 05/23/2021   GLUCOSE 167 (H) 05/23/2021   BUN 11 05/23/2021   CREATININE 0.63 05/23/2021   BILITOT 0.5 05/23/2021   ALKPHOS 76 05/23/2021   AST 29 05/23/2021   ALT 45 (H) 05/23/2021   PROT 6.9 05/23/2021   ALBUMIN 3.8 05/23/2021   CALCIUM 8.7 (L) 05/23/2021   ANIONGAP 6 05/23/2021   Lab Results  Component Value Date   CHOL 193 04/20/2020   Lab Results  Component Value Date   HDL 48 04/20/2020   Lab Results  Component Value Date   LDLCALC 127 (H) 04/20/2020   Lab Results  Component Value Date   TRIG 98 04/20/2020   No results found for: CHOLHDL Lab Results  Component Value Date   HGBA1C 5.8 (H) 04/20/2020      Assessment & Plan:   Problem List Items Addressed This Visit       Other   S/P laparoscopic sleeve gastrectomy    She is actually doing phenomenally well she is down 14 pounds incisions look great no longer needing pain  medication or even using palate Tylenol though she still is a little sore she is developing some rash after the surgery so she is planning on documenting that and Barbara Mcmahon to try to treat that.  Really she is doing phenomenally add Love to see her back in about 3 months.  We will try to transition off of Lovenox in about 1 week.      Hair loss    She has had some thinning of hair on the top of her hea, after having had COVID discussed that this is not unusual we have had quite a few patients reporting this.  And then also having bariatric surgery can cause some transient hair loss as well.  Recommend trial of Women's Rogaine.  And then hopefully over the next 6 months she will start to get  some new hair growth in place.  He is not planning on getting pregnant.  Did warn against getting pregnant while on Rogaine.      Other Visit Diagnoses     Need for influenza vaccination    -  Primary   Relevant Orders   Flu Vaccine QUAD 43moIM (Fluarix, Fluzone & Alfiuria Quad PF) (Completed)   S/P gastric sleeve procedure       Rash           Rash around her umbilicus looks like eczema or even psoriasis though she has no history of psoriasis we will treat with topical steroid cream.  If not proving over the next couple weeks please let uKoreaknow.  She also has some erythema under the axilla-no recent changes to deodorants etc.  Likely from recent surgery.  Okay to do a trial of a steroid under 1 axilla and if after 4 to 5 days it seems to be helping then okay to apply to the opposite.  It does not look consistent with a yeast infection so I do not think will make it worse by treating with a steroid.  If the steroid is uncomfortable or burns then do not use.  No orders of the defined types were placed in this encounter.   Follow-up: Return in about 4 months (around 10/01/2021).   I spent 35 minutes on the day of the encounter to include pre-visit record review, face-to-face time with the patient and post visit ordering of test.   CBeatrice Lecher MD

## 2021-06-01 NOTE — Assessment & Plan Note (Signed)
She is actually doing phenomenally well she is down 14 pounds incisions look great no longer needing pain medication or even using palate Tylenol though she still is a little sore she is developing some rash after the surgery so she is planning on documenting that and Lilia Pro to try to treat that.  Really she is doing phenomenally add Love to see her back in about 3 months.  We will try to transition off of Lovenox in about 1 week.

## 2021-06-06 ENCOUNTER — Other Ambulatory Visit: Payer: Self-pay

## 2021-06-06 ENCOUNTER — Encounter: Payer: 59 | Admitting: Skilled Nursing Facility1

## 2021-06-06 DIAGNOSIS — Z6841 Body Mass Index (BMI) 40.0 and over, adult: Secondary | ICD-10-CM | POA: Diagnosis present

## 2021-06-06 NOTE — Progress Notes (Signed)
2 Week Post-Operative Nutrition Class   Patient was seen on 06/06/2021 for Post-Operative Nutrition education at [facility]    Surgery date: 05/22/2021 Surgery type: sleeve Start weight at [facility]: 311.2 Weight today: 314 Bowel Habits: Every day to every other day no complaints   Body Composition Scale 06/06/2021  Current Body Weight 292.2  Total Body Fat % 48.2  Visceral Fat 19  Fat-Free Mass % 51.7   Total Body Water % 40.3  Muscle-Mass lbs 32.9  BMI 48.3  Body Fat Displacement          Torso  lbs 87.5         Left Leg  lbs 17.5         Right Leg  lbs 17.5         Left Arm  lbs 8.7         Right Arm   lbs 8.7      The following the learning objectives were met by the patient during this course: Identifies Phase 3 (Soft, High Proteins) Dietary Goals and will begin from 2 weeks post-operatively to 2 months post-operatively Identifies appropriate sources of fluids and proteins  Identifies appropriate fat sources and healthy verses unhealthy fat types   States protein recommendations and appropriate sources post-operatively Identifies the need for appropriate texture modifications, mastication, and bite sizes when consuming solids Identifies appropriate fat consumption and sources Identifies appropriate multivitamin and calcium sources post-operatively Describes the need for physical activity post-operatively and will follow MD recommendations States when to call healthcare provider regarding medication questions or post-operative complications   Handouts given during class include: Phase 3A: Soft, High Protein Diet Handout Phase 3 High Protein Meals Healthy Fats   Follow-Up Plan: Patient will follow-up at [facility] in 6 weeks for 2 month post-op nutrition visit for diet advancement per MD.

## 2021-06-10 ENCOUNTER — Encounter: Payer: Self-pay | Admitting: Family Medicine

## 2021-06-10 DIAGNOSIS — F902 Attention-deficit hyperactivity disorder, combined type: Secondary | ICD-10-CM

## 2021-06-13 ENCOUNTER — Telehealth: Payer: Self-pay | Admitting: Skilled Nursing Facility1

## 2021-06-13 MED ORDER — AMPHETAMINE-DEXTROAMPHET ER 20 MG PO CP24: 20.0000 mg | ORAL_CAPSULE | Freq: Every morning | ORAL | 0 refills | Status: DC

## 2021-06-13 NOTE — Telephone Encounter (Signed)
Returned pts call.   Pt states she is struggling a bit with fluid goals but protein is going fine. Pt states she is getting in 50-60 fluid ounces.

## 2021-06-13 NOTE — Telephone Encounter (Signed)
Meds ordered this encounter  Medications   amphetamine-dextroamphetamine (ADDERALL XR) 20 MG 24 hr capsule    Sig: Take 1 capsule (20 mg total) by mouth in the morning.    Dispense:  30 capsule    Refill:  0

## 2021-06-13 NOTE — Telephone Encounter (Signed)
RD called pt to verify fluid intake once starting soft, solid proteins 2 week post-bariatric surgery.   Daily Fluid intake:  Daily Protein intake: Bowel Habits:   Concerns/issues:    LVM 

## 2021-06-30 ENCOUNTER — Other Ambulatory Visit: Payer: Self-pay

## 2021-06-30 ENCOUNTER — Ambulatory Visit (INDEPENDENT_AMBULATORY_CARE_PROVIDER_SITE_OTHER): Payer: 59 | Admitting: Sports Medicine

## 2021-06-30 ENCOUNTER — Ambulatory Visit (INDEPENDENT_AMBULATORY_CARE_PROVIDER_SITE_OTHER): Payer: 59

## 2021-06-30 DIAGNOSIS — M1712 Unilateral primary osteoarthritis, left knee: Secondary | ICD-10-CM

## 2021-06-30 DIAGNOSIS — Z09 Encounter for follow-up examination after completed treatment for conditions other than malignant neoplasm: Secondary | ICD-10-CM | POA: Diagnosis not present

## 2021-06-30 DIAGNOSIS — M25562 Pain in left knee: Secondary | ICD-10-CM

## 2021-06-30 DIAGNOSIS — S83207A Unspecified tear of unspecified meniscus, current injury, left knee, initial encounter: Secondary | ICD-10-CM | POA: Insufficient documentation

## 2021-06-30 NOTE — Progress Notes (Signed)
    Procedures performed today:    Procedure: Real-time Ultrasound Guided injection of the left knee Device: Samsung HS60  Verbal informed consent obtained.  Time-out conducted.  Noted no overlying erythema, induration, or other signs of local infection.  Skin prepped in a sterile fashion.  Local anesthesia: Topical Ethyl chloride.  With sterile technique and under real time ultrasound guidance: Only trace effusion noted, 1 cc Kenalog 40, 2 cc lidocaine, 2 cc bupivacaine injected easily Completed without difficulty  Advised to call if fevers/chills, erythema, induration, drainage, or persistent bleeding.  Images permanently stored and available for review in PACS.  Impression: Technically successful ultrasound guided injection.  Independent interpretation of notes and tests performed by another provider:   None.  Brief History, Exam, Impression, and Recommendations:    Primary osteoarthritis of left knee History of arthroscopic meniscal repair and debridement sometime ago, now increasing pain medial joint line left knee, no mechanical symptoms for Injected today, getting x-rays, conditioning exercises given. Return to see me in approximately 6 weeks. Of note she had bariatric surgery a couple months ago, she is already 30 pounds down. This will help her knee arthritis significantly.    ___________________________________________ Gwen Her. Dianah Field, M.D., ABFM., CAQSM. Primary Care and [place] Instructor of [employer] [facility] [employer]

## 2021-06-30 NOTE — Assessment & Plan Note (Addendum)
History of arthroscopic meniscal repair and debridement sometime ago, now increasing pain medial joint line left knee, no mechanical symptoms for Injected today, getting x-rays, conditioning exercises given. Return to see me in approximately 6 weeks. Of note she had bariatric surgery a couple months ago, she is already 30 pounds down. This will help her knee arthritis significantly.

## 2021-07-19 ENCOUNTER — Other Ambulatory Visit: Payer: Self-pay

## 2021-07-19 ENCOUNTER — Encounter: Payer: 59 | Attending: General Surgery | Admitting: Skilled Nursing Facility1

## 2021-07-19 NOTE — Progress Notes (Signed)
Bariatric Nutrition Follow-Up Visit Medical Nutrition Therapy   Surgery Date: 05/22/2021   NUTRITION ASSESSMENT    Surgery date: 05/22/2021 Surgery type: sleeve Start weight at [facility]: 311.2 Weight today: 277.2 pounds   Body Composition Scale 06/06/2021 07/19/2021  Current Body Weight 292.2 277.2  Total Body Fat % 48.2 47.1  Visceral Fat 19 18  Fat-Free Mass % 51.7 52.8   Total Body Water % 40.3 40.9  Muscle-Mass lbs 32.9 32.8  BMI 48.3 45.8  Body Fat Displacement           Torso  lbs 87.5 81         Left Leg  lbs 17.5 16.2         Right Leg  lbs 17.5 16.2         Left Arm  lbs 8.7 8.1         Right Arm   lbs 8.7 8.1   Clinical  Medical hx: prediabetes, Anxiety, arthritis, seizures, colon polyps, IBS, PCOS, 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 Vitamin D Allergies: nutmeg oil, shellfish, shrimp, yeast, Cinnamon   Lifestyle & Dietary Hx  Pt states the multivitamins have made her nauseous and is taking them int he morning: dietitian advised pt to try it in the evening.  Pt states everything has been really simple and easy. Pt states her family and her work are very supportive.  Pt states in the middle of the night she gets hungry so will eat yogurt or cheese.   Estimated daily fluid intake: 64 oz Estimated daily protein intake: 75+ g Supplements: multi + calcium Current average weekly physical activity: 60 minutes 3 times a week workout classes    24-Hr Dietary Recall First Meal: protein shake Snack: greek yogurt + string cheese  Second Meal: wendys chili or black beans or chicken or refried beans + taco sauce  Snack: harvest snap (less than the serving)  Third Meal: greek chicken + tzeki sauce or sugar free BBQ sauce + chicken Snack: yogurt or cheese after gone to sleep Beverages: vitamin water, water, tea product  Post-Op Goals/ Signs/ Symptoms Using straws: no Drinking  while eating: no Chewing/swallowing difficulties: no Changes in vision: no Changes to mood/headaches: no Hair loss/changes to skin/nails: no Difficulty focusing/concentrating: no Sweating: no Dizziness/lightheadedness: no Palpitations: no  Carbonated/caffeinated beverages: no N/V/D/C/Gas: no Abdominal pain: no Dumping syndrome: no    NUTRITION DIAGNOSIS  Overweight/obesity ([place]-3.3) related to past poor dietary habits and physical inactivity as evidenced by completed bariatric surgery and following dietary guidelines for continued weight loss and healthy nutrition status.     NUTRITION INTERVENTION Nutrition counseling (C-1) and education (E-2) to facilitate bariatric surgery goals, including: Diet advancement to the next phase (phase 4) now including non starchy vegetables The importance of consuming adequate calories as well as certain nutrients daily due to the body's need for essential vitamins, minerals, and fats The importance of daily physical activity and to reach a goal of at least 150 minutes of moderate to vigorous physical activity weekly (or as directed by their physician) due to benefits such as increased musculature and improved lab values The importance of intuitive eating specifically learning hunger-satiety cues and understanding the importance of learning a new body: The importance of mindful eating to avoid grazing behaviors  Goals: -Continue to aim for a minimum of 64 fluid ounces 7 days a week with at least 30 ounces being plain water  -Eat  non-starchy vegetables 2 times a day 7 days a week  -Start out with soft cooked vegetables today and tomorrow; if tolerated begin to eat raw vegetables or cooked including salads  -Eat your 3 ounces of protein first then start in on your non-starchy vegetables; once you understand how much of your meal leads to satisfaction and not full while still eating 3 ounces of protein and non-starchy vegetables you can eat them in any  order   -Continue to aim for 30 minutes of activity at least 5 times a week  -Do NOT cook with/add to your food: alfredo sauce, cheese sauce, barbeque sauce, ketchup, fat back, butter, bacon grease, grease, Crisco, OR SUGAR   Handouts Provided Include  Phase 4  Learning Style & Readiness for Change Teaching method utilized: Visual & Auditory  Demonstrated degree of understanding via: Teach Back  Readiness Level: action Barriers to learning/adherence to lifestyle change: none  RD's Notes for Next Visit Assess adherence to pt chosen goals    MONITORING & EVALUATION Dietary intake, weekly physical activity, body weight  Next Steps Patient is to follow-up in January

## 2021-07-23 ENCOUNTER — Other Ambulatory Visit: Payer: Self-pay | Admitting: Family Medicine

## 2021-07-23 DIAGNOSIS — F902 Attention-deficit hyperactivity disorder, combined type: Secondary | ICD-10-CM

## 2021-07-24 MED ORDER — AMPHETAMINE-DEXTROAMPHET ER 20 MG PO CP24: 20.0000 mg | ORAL_CAPSULE | Freq: Every morning | ORAL | 0 refills | Status: DC

## 2021-07-27 ENCOUNTER — Ambulatory Visit (INDEPENDENT_AMBULATORY_CARE_PROVIDER_SITE_OTHER): Payer: 59 | Admitting: Sports Medicine

## 2021-07-27 ENCOUNTER — Other Ambulatory Visit: Payer: Self-pay

## 2021-07-27 DIAGNOSIS — L853 Xerosis cutis: Secondary | ICD-10-CM | POA: Diagnosis not present

## 2021-07-27 MED ORDER — TRIAMCINOLONE ACETONIDE 0.5 % EX CREA: TOPICAL_CREAM | Freq: Two times a day (BID) | CUTANEOUS | 11 refills | Status: DC

## 2021-07-27 NOTE — Assessment & Plan Note (Signed)
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 Return as needed.

## 2021-07-27 NOTE — Addendum Note (Signed)
Addended by: Gust Brooms on: 07/27/2021 11:50 AM   Modules accepted: Orders

## 2021-07-27 NOTE — Progress Notes (Signed)
    Procedures performed today:    None.  Independent interpretation of notes and tests performed by another provider:   None.  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 Return as needed.    ___________________________________________ Gwen Her. Dianah Field, M.D., ABFM., CAQSM. Primary Care and [place] Instructor of [place] [facility]

## 2021-07-28 ENCOUNTER — Inpatient Hospital Stay (HOSPITAL_BASED_OUTPATIENT_CLINIC_OR_DEPARTMENT_OTHER): Payer: 59 | Admitting: Hematology & Oncology

## 2021-07-28 ENCOUNTER — Encounter: Payer: Self-pay | Admitting: Hematology & Oncology

## 2021-07-28 ENCOUNTER — Inpatient Hospital Stay: Payer: 59 | Attending: Hematology & Oncology

## 2021-07-28 ENCOUNTER — Telehealth: Payer: Self-pay | Admitting: *Deleted

## 2021-07-28 VITALS — BP 119/65 | HR 86 | Temp 98.2°F | Resp 18 | Wt 276.0 lb

## 2021-07-28 DIAGNOSIS — Z7901 Long term (current) use of anticoagulants: Secondary | ICD-10-CM | POA: Diagnosis not present

## 2021-07-28 DIAGNOSIS — K580 Irritable bowel syndrome with diarrhea: Secondary | ICD-10-CM

## 2021-07-28 DIAGNOSIS — Z86711 Personal history of pulmonary embolism: Secondary | ICD-10-CM | POA: Insufficient documentation

## 2021-07-28 DIAGNOSIS — E282 Polycystic ovarian syndrome: Secondary | ICD-10-CM | POA: Insufficient documentation

## 2021-07-28 DIAGNOSIS — I2699 Other pulmonary embolism without acute cor pulmonale: Secondary | ICD-10-CM

## 2021-07-28 DIAGNOSIS — Z803 Family history of malignant neoplasm of breast: Secondary | ICD-10-CM | POA: Insufficient documentation

## 2021-07-28 LAB — CMP (CANCER CENTER ONLY)
ALT: 23 U/L (ref 0–44)
AST: 16 U/L (ref 15–41)
Albumin: 3.9 g/dL (ref 3.5–5.0)
Alkaline Phosphatase: 83 U/L (ref 38–126)
Anion gap: 7 (ref 5–15)
BUN: 19 mg/dL (ref 6–20)
CO2: 28 mmol/L (ref 22–32)
Calcium: 9.6 mg/dL (ref 8.9–10.3)
Chloride: 105 mmol/L (ref 98–111)
Creatinine: 0.61 mg/dL (ref 0.44–1.00)
GFR, Estimated: >60 mL/min (ref >60)
Glucose, Bld: 106 mg/dL — ABNORMAL HIGH (ref 70–99)
Potassium: 4.1 mmol/L (ref 3.5–5.1)
Sodium: 140 mmol/L (ref 135–145)
Total Bilirubin: 0.4 mg/dL (ref 0.3–1.2)
Total Protein: 6.8 g/dL (ref 6.5–8.1)

## 2021-07-28 LAB — CBC WITH DIFFERENTIAL (CANCER CENTER ONLY)
Abs Immature Granulocytes: 0.03 10*3/uL (ref 0.00–0.07)
Basophils Absolute: 0 10*3/uL (ref 0.0–0.1)
Basophils Relative: 0 %
Eosinophils Absolute: 0 10*3/uL (ref 0.0–0.5)
Eosinophils Relative: 1 %
HCT: 39.8 % (ref 36.0–46.0)
Hemoglobin: 13.2 g/dL (ref 12.0–15.0)
Immature Granulocytes: 0 %
Lymphocytes Relative: 29 %
Lymphs Abs: 2.2 10*3/uL (ref 0.7–4.0)
MCH: 30.3 pg (ref 26.0–34.0)
MCHC: 33.2 g/dL (ref 30.0–36.0)
MCV: 91.5 fL (ref 80.0–100.0)
Monocytes Absolute: 0.7 10*3/uL (ref 0.1–1.0)
Monocytes Relative: 9 %
Neutro Abs: 4.4 10*3/uL (ref 1.7–7.7)
Neutrophils Relative %: 61 %
Platelet Count: 257 10*3/uL (ref 150–400)
RBC: 4.35 MIL/uL (ref 3.87–5.11)
RDW: 13.8 % (ref 11.5–15.5)
WBC Count: 7.4 10*3/uL (ref 4.0–10.5)
nRBC: 0 % (ref 0.0–0.2)

## 2021-07-28 NOTE — Progress Notes (Signed)
Hematology and Oncology Follow Up Visit  Barbara Mcmahon [id number] 1976-03-14 45 y.o. 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 twice daily-complete 1 year of therapy in September 2019 Eilquis 2.5 mg po BID - start 06/08/2018     Interim History:  [name] is back for follow-up.  She looks fantastic.  She had a gastric sleeve done in August.  She had no problems with this.  She is already lost almost 40 pounds.  I am very impressed by this.  She has always been such an inspiration to all of [name].  She really is motivated.  She is exercising.  She just feels better.  She says that her sister, who is 65 years old, recent was diagnosed with breast cancer.  She has not yet had surgery.  I did give Ms. Chargois a prayer blanket for her sister.  She has had no problems with cough or shortness of breath.  She has had no problems with bleeding or bruising.  She is on Eliquis.  She had no issues with the gastric sleeve with respect to bleeding.  Has been no change in bowel or bladder habits.  She has had no problems with leg swelling.  She does have some varicose veins.  There is been no headache.  Overall, I would say performance status is ECOG 0.      Medications:  Current Outpatient Medications:    amphetamine-dextroamphetamine (ADDERALL XR) 20 MG 24 hr capsule, Take 1 capsule (20 mg total) by mouth in the morning., Disp: 30 capsule, Rfl: 0   amphetamine-dextroamphetamine (ADDERALL) 10 MG tablet, Take 1 tablet (10 mg total) by mouth daily as needed (ADHD)., Disp: , Rfl:    DULoxetine (CYMBALTA) 60 MG capsule, Take 2 capsules (120 mg total) by mouth daily., Disp: 180 capsule, Rfl: 1   ELIQUIS 5 MG TABS tablet, TAKE ONE TABLET BY MOUTH TWICE A DAY (Patient taking differently: Take 5 mg by mouth 2 (two) times daily.), Disp: 180 tablet, Rfl: 3   enoxaparin (LOVENOX) 60 MG/0.6ML injection,  Inject 0.6 mLs (60 mg total) into the skin daily for 14 days. (Patient not taking: Reported on 06/01/2021), Disp: 8.4 mL, Rfl: 0   EPINEPHrine 0.3 mg/0.3 mL IJ SOAJ injection, Inject 0.3 mg into the muscle once as needed (anaphylaxis)., Disp: 1 each, Rfl: 1   levonorgestrel (MIRENA) 20 MCG/DAY IUD, 1 each by Intrauterine route once., Disp: , Rfl:    Melatonin 1 MG CAPS, Take 3 mg by mouth at bedtime., Disp: , Rfl:    Multiple Vitamin (MULTI-VITAMIN) tablet, Take 1 tablet by mouth daily., Disp: , Rfl:    triamcinolone 0.5%-Eucerin equivalent 1:1 cream mixture, Apply topically 2 (two) times daily., Disp: 30 g, Rfl: 11   triamcinolone ointment (KENALOG) 0.1 %, Apply 1 application topically 2 (two) times daily as needed (dry skin)., Disp: , Rfl:    Vitamin D, Ergocalciferol, (DRISDOL) 1.25 MG (50000 UNIT) CAPS capsule, Take 1 capsule (50,000 Units total) by mouth every 7 (seven) days., Disp: 12 capsule, Rfl: 1  Allergies:  Allergies  Allergen Reactions   Cinnamon Shortness Of Breath and Rash    Wheezing    Guaifenesin-Codeine Itching   Morphine Itching, Rash and Hives    Other reaction(s): Other (See Comments) BEHAVIOR CHANGES Big mood changes BEHAVIOR CHANGES irritable   Nutmeg Oil (Myristica Oil) Shortness Of Breath and Rash    wheezing  Shellfish Allergy Hives, Swelling and Anaphylaxis    Throat closes   Shrimp Extract Allergy Skin Test     Other reaction(s): Wheezing   Codeine Hives and Itching    Pt says she can take percocet   Clonazepam Itching   Menthol Hives   Robitussin A-C [Guaifenesin-Codeine] Itching   Tramadol Itching   Yeast Hives    Past Medical History, Surgical history, Social history, and Family History were reviewed and updated.  Review of Systems: Review of Systems  Constitutional: Negative.   HENT:  Negative.    Eyes: Negative.   Respiratory: Negative.    Cardiovascular: Negative.   Gastrointestinal: Negative.   Endocrine: Negative.   Genitourinary:  Negative.    Musculoskeletal: Negative.   Skin: Negative.   Neurological: Negative.   Hematological: Negative.   Psychiatric/Behavioral: Negative.     Physical Exam:  weight is 276 lb (125.2 kg). Her oral temperature is 98.2 F (36.8 C). Her blood pressure is 119/65 and her pulse is 86. Her respiration is 18 and oxygen saturation is 99%.   Wt Readings from Last 3 Encounters:  07/28/21 276 lb (125.2 kg)  07/19/21 277 lb 3.2 oz (125.7 kg)  06/06/21 292 lb 3.2 oz (132.5 kg)    Physical Exam Vitals reviewed.  HENT:     Head: Normocephalic and atraumatic.  Eyes:     Pupils: Pupils are equal, round, and reactive to light.  Cardiovascular:     Rate and Rhythm: Normal rate and regular rhythm.     Heart sounds: Normal heart sounds.  Pulmonary:     Effort: Pulmonary effort is normal.     Breath sounds: Normal breath sounds.  Abdominal:     General: Bowel sounds are normal.     Palpations: Abdomen is soft.  Musculoskeletal:        General: No tenderness or deformity. Normal range of motion.     Cervical back: Normal range of motion.  Lymphadenopathy:     Cervical: No cervical adenopathy.  Skin:    General: Skin is warm and dry.     Findings: No erythema or rash.  Neurological:     Mental Status: She is alert and oriented to person, place, and time.  Psychiatric:        Behavior: Behavior normal.        Thought Content: Thought content normal.        Judgment: Judgment normal.     Lab Results  Component Value Date   WBC 7.4 07/28/2021   HGB 13.2 07/28/2021   HCT 39.8 07/28/2021   MCV 91.5 07/28/2021   PLT 257 07/28/2021     Chemistry      Component Value Date/Time   NA 140 07/28/2021 0934   NA 141 04/20/2020 1441   NA 140 10/11/2017 1122   K 4.1 07/28/2021 0934   K 4.2 10/11/2017 1122   CL 105 07/28/2021 0934   CL 103 10/11/2017 1122   CO2 28 07/28/2021 0934   CO2 29 10/11/2017 1122   BUN 19 07/28/2021 0934   BUN 14 04/20/2020 1441   BUN 10 10/11/2017 1122    CREATININE 0.61 07/28/2021 0934   CREATININE 0.71 12/22/2020 0930      Component Value Date/Time   CALCIUM 9.6 07/28/2021 0934   CALCIUM 8.7 10/11/2017 1122   ALKPHOS 83 07/28/2021 0934   ALKPHOS 83 10/11/2017 1122   AST 16 07/28/2021 0934   ALT 23 07/28/2021 0934   ALT 36 10/11/2017 1122  BILITOT 0.4 07/28/2021 0934      Impression and Plan: Ms. Marteney is a 45 year-old white female.  She has polycystic ovaries.  She had a pulmonary embolism in September 2018.  This was idiopathic.  We  have her on Eliquis.  She likes to be on low-dose Eliquis.  She does travel for work.  She just feels more comfortable traveling.  I am so happy that the gastric sleeve went well.  I am also happy that she is losing the weight and she will continue to lose weight because she is incredibly motivated.  I saw a picture of her new pup.  This is a tiny little thing.  She has the greatest eyes.  She is incredibly adorable.  I am so glad that she will now have a long and loving home.  We will plan to have Mr. Faeth come back in 6 months.  She can still come back sooner if she has any problems.   Volanda Napoleon, MD 10/21/202210:44 AM

## 2021-07-28 NOTE — Telephone Encounter (Signed)
Per 07/28/21 los - gave upcoming appointments - confirmed

## 2021-08-01 ENCOUNTER — Encounter (INDEPENDENT_AMBULATORY_CARE_PROVIDER_SITE_OTHER): Payer: 59

## 2021-08-01 ENCOUNTER — Encounter: Payer: Self-pay | Admitting: Family Medicine

## 2021-08-01 DIAGNOSIS — M1712 Unilateral primary osteoarthritis, left knee: Secondary | ICD-10-CM | POA: Diagnosis not present

## 2021-08-01 NOTE — Telephone Encounter (Signed)
I spent 5 total minutes of online digital evaluation and management services. 

## 2021-08-02 ENCOUNTER — Telehealth: Payer: Self-pay | Admitting: Skilled Nursing Facility1

## 2021-08-02 NOTE — Telephone Encounter (Signed)
Pt states she is worried she is eating too much. Pt states she does do really intense workouts.   Dietitian advised pt to increase her non starchy vegetables with her meals.

## 2021-08-03 ENCOUNTER — Encounter: Payer: Self-pay | Admitting: Family Medicine

## 2021-08-11 ENCOUNTER — Ambulatory Visit (INDEPENDENT_AMBULATORY_CARE_PROVIDER_SITE_OTHER): Payer: 59 | Admitting: Sports Medicine

## 2021-08-11 ENCOUNTER — Other Ambulatory Visit: Payer: Self-pay

## 2021-08-11 DIAGNOSIS — L853 Xerosis cutis: Secondary | ICD-10-CM

## 2021-08-11 DIAGNOSIS — M1712 Unilateral primary osteoarthritis, left knee: Secondary | ICD-10-CM

## 2021-08-11 NOTE — Assessment & Plan Note (Signed)
Better with triamcinolone/Eucerin cream.

## 2021-08-11 NOTE — Progress Notes (Signed)
    Procedures performed today:    None.  Independent interpretation of notes and tests performed by another provider:   None.  Brief History, Exam, Impression, and Recommendations:    Primary osteoarthritis of left knee Left knee injected at the last visit, 40 pounds down bariatric surgery, happy with how things are going, return as needed.  Xerosis of skin Better with triamcinolone/Eucerin cream.    ___________________________________________ Gwen Her. Dianah Field, M.D., ABFM., CAQSM. Primary Care and [place] Instructor of [place] of [facility] of Medicine

## 2021-08-11 NOTE — Assessment & Plan Note (Signed)
Left knee injected at the last visit, 40 pounds down bariatric surgery, happy with how things are going, return as needed.

## 2021-08-21 ENCOUNTER — Telehealth: Payer: Self-pay | Admitting: Family Medicine

## 2021-08-21 ENCOUNTER — Encounter: Payer: Self-pay | Admitting: Family Medicine

## 2021-08-21 ENCOUNTER — Other Ambulatory Visit: Payer: Self-pay | Admitting: Family Medicine

## 2021-08-21 DIAGNOSIS — L853 Xerosis cutis: Secondary | ICD-10-CM

## 2021-08-21 DIAGNOSIS — E559 Vitamin D deficiency, unspecified: Secondary | ICD-10-CM

## 2021-08-21 DIAGNOSIS — F902 Attention-deficit hyperactivity disorder, combined type: Secondary | ICD-10-CM

## 2021-08-21 DIAGNOSIS — E7849 Other hyperlipidemia: Secondary | ICD-10-CM

## 2021-08-21 MED ORDER — TRIAMCINOLONE ACETONIDE 0.5 % EX CREA: TOPICAL_CREAM | Freq: Two times a day (BID) | CUTANEOUS | 11 refills | Status: DC

## 2021-08-21 NOTE — Telephone Encounter (Signed)
Pt states her and Dr Madilyn Fireman discussed her coming by for a lab draw for Br Ca (Breast Cancer)... She plans to come in on Wednesday to have this completed. Patient is making  sure order is in. Thank you

## 2021-08-21 NOTE — Telephone Encounter (Signed)
This will require a nurse visit to complete this. Please schedule. Thank you!

## 2021-08-22 MED ORDER — TRIAMCINOLONE ACETONIDE 0.5 % EX CREA: TOPICAL_CREAM | Freq: Two times a day (BID) | CUTANEOUS | 11 refills | Status: DC

## 2021-08-22 MED ORDER — VITAMIN D (ERGOCALCIFEROL) 1.25 MG (50000 UNIT) PO CAPS: 50000.0000 [IU] | ORAL_CAPSULE | ORAL | 1 refills | Status: DC

## 2021-08-22 NOTE — Addendum Note (Signed)
Addended by: Silverio Decamp on: 08/22/2021 11:59 AM   Modules accepted: Orders

## 2021-08-23 ENCOUNTER — Ambulatory Visit: Payer: 59 | Admitting: Family Medicine

## 2021-08-25 ENCOUNTER — Other Ambulatory Visit: Payer: Self-pay

## 2021-08-25 ENCOUNTER — Ambulatory Visit (INDEPENDENT_AMBULATORY_CARE_PROVIDER_SITE_OTHER): Payer: 59 | Admitting: Family Medicine

## 2021-08-25 DIAGNOSIS — Z1379 Encounter for other screening for genetic and chromosomal anomalies: Secondary | ICD-10-CM | POA: Diagnosis not present

## 2021-08-25 NOTE — Progress Notes (Signed)
Patient came in for breast cancer screening. [facility]riad saliva test was done. All forms were completed and package has been shipped through [facility] .

## 2021-08-25 NOTE — Progress Notes (Signed)
Agree with above.  BRCA: And full myriad genetic testing recommended.  Based on family history of breast, colon and melanoma.  Hopefully will receive results within the next month and can go over those with her.  Also okay to send copy to OB/GYN if she is okay with that.

## 2021-09-14 ENCOUNTER — Other Ambulatory Visit: Payer: Self-pay | Admitting: Family Medicine

## 2021-09-14 DIAGNOSIS — F902 Attention-deficit hyperactivity disorder, combined type: Secondary | ICD-10-CM

## 2021-09-14 MED ORDER — AMPHETAMINE-DEXTROAMPHET ER 20 MG PO CP24: 20.0000 mg | ORAL_CAPSULE | Freq: Every morning | ORAL | 0 refills | Status: DC

## 2021-09-26 ENCOUNTER — Telehealth: Payer: Self-pay | Admitting: Family Medicine

## 2021-09-26 NOTE — Telephone Encounter (Signed)
LVM for patient to call back to get a virtual visit scheduled to go over genetic testing results. AM

## 2021-09-26 NOTE — Telephone Encounter (Signed)
Please call patient to let her know that I did receive her genetic testing results.  Would like to get her scheduled for a virtual visit so that we can go over those together.

## 2021-10-02 ENCOUNTER — Encounter: Payer: Self-pay | Admitting: Hematology & Oncology

## 2021-10-03 ENCOUNTER — Other Ambulatory Visit: Payer: Self-pay

## 2021-10-03 ENCOUNTER — Ambulatory Visit (INDEPENDENT_AMBULATORY_CARE_PROVIDER_SITE_OTHER): Payer: 59 | Admitting: Family Medicine

## 2021-10-03 ENCOUNTER — Encounter: Payer: Self-pay | Admitting: Family Medicine

## 2021-10-03 VITALS — BP 144/82 | HR 43 | Temp 98.2°F | Resp 16 | Ht 65.0 in | Wt 265.0 lb

## 2021-10-03 DIAGNOSIS — Z6841 Body Mass Index (BMI) 40.0 and over, adult: Secondary | ICD-10-CM | POA: Diagnosis not present

## 2021-10-03 DIAGNOSIS — Z9189 Other specified personal risk factors, not elsewhere classified: Secondary | ICD-10-CM | POA: Insufficient documentation

## 2021-10-03 DIAGNOSIS — R0602 Shortness of breath: Secondary | ICD-10-CM

## 2021-10-03 DIAGNOSIS — F902 Attention-deficit hyperactivity disorder, combined type: Secondary | ICD-10-CM

## 2021-10-03 MED ORDER — APIXABAN 5 MG PO TABS: 5.0000 mg | ORAL_TABLET | Freq: Two times a day (BID) | ORAL | 3 refills | Status: DC

## 2021-10-03 MED ORDER — AMPHETAMINE-DEXTROAMPHET ER 20 MG PO CP24: 20.0000 mg | ORAL_CAPSULE | Freq: Every morning | ORAL | 0 refills | Status: DC

## 2021-10-03 MED ORDER — AMPHETAMINE-DEXTROAMPHET ER 20 MG PO CP24: 20.0000 mg | ORAL_CAPSULE | ORAL | 0 refills | Status: DC

## 2021-10-03 NOTE — Progress Notes (Signed)
Established Patient Office Visit  Subjective:  Patient ID: Barbara Mcmahon, female    DOB: 11/21/1975  Age: 45 y.o. MRN: [id number]  CC:  Chief Complaint  Patient presents with   Follow-up    HPI Barbara Mcmahon presents for 4 mo f/u.    I also received her genetic risk results back for BrCa screening.    ADHD - Reports symptoms are well controlled on current regime. Denies any problems with insomnia, chest pain, palpitations, or SOB.    She is status post sleeve gastrectomy performed by Dr. [name] on July 12, 2021.  So its been most 3 months..  She is doing really well. She has been exercising 3 days per week at a [facility]. Taking some of the exercise classes.  She has passed her 50 lb mark!!!!!   Past Medical History:  Diagnosis Date   Abnormal Pap smear and cervical HPV (human papillomavirus)    Anxiety    Arthritis    BAck and sacral joint   Back pain    Colon polyps    Constipation    Gallbladder problem    Generalized anxiety disorder    GERD (gastroesophageal reflux disease)    IBS (irritable bowel syndrome)    Infertility, female    Joint pain    Multiple food allergies    Shellfish, cinnamon and yeast   Obesity    PCOS (polycystic ovarian syndrome)    Pneumonia    PONV (postoperative nausea and vomiting)    Prediabetes    Pulmonary embolism (HCC)    Seizure disorder (HCC)    Vitamin D deficiency     Past Surgical History:  Procedure Laterality Date   BREAST BIOPSY Right 2019   benign   CHOLECYSTECTOMY     COLPOSCOPY     DILATION AND CURETTAGE OF UTERUS     GYNECOLOGIC CRYOSURGERY     KNEE SURGERY  right   UPPER GI ENDOSCOPY N/A 05/22/2021   Procedure: UPPER GI ENDOSCOPY;  Surgeon: [name], MD;  Location: [facility];  Service: General;  Laterality: N/A;    Family History  Problem Relation Age of Onset   Hypertension Mother    Depression Mother    Anxiety disorder Mother    Eating disorder Mother    Rheum arthritis Mother     Hypertension Father    Depression Father    Anxiety disorder Father    Sleep apnea Father    Obesity Father    Rheum arthritis Father    Breast cancer Sister        Bilat   Diabetes Paternal Grandmother    Hypertension Paternal Grandmother    Stroke Paternal Grandmother    Diabetes Paternal Grandfather    Hypertension Paternal Grandfather    Stroke Paternal Grandfather     Social History   Socioeconomic History   Marital status: Married    Spouse name: Ellison Leisure   Number of children: 2   Years of education: Not on file   Highest education level: Not on file  Occupational History   Occupation: Art gallery manager  Tobacco Use   Smoking status: Never   Smokeless tobacco: Never  Vaping Use   Vaping Use: Never used  Substance and Sexual Activity   Alcohol use: No   Drug use: No   Sexual activity: Not Currently  Other Topics Concern   Not on file  Social History Narrative   Not on file   Social Determinants of Health  Financial Resource Strain: Not on file  Food Insecurity: Not on file  Transportation Needs: Not on file  Physical Activity: Not on file  Stress: Not on file  Social Connections: Not on file  Intimate Partner Violence: Not on file    Outpatient Medications Prior to Visit  Medication Sig Dispense Refill   amphetamine-dextroamphetamine (ADDERALL) 10 MG tablet Take 1 tablet (10 mg total) by mouth daily as needed (ADHD).     apixaban (ELIQUIS) 5 MG TABS tablet Take 1 tablet (5 mg total) by mouth 2 (two) times daily. 60 tablet 3   DULoxetine (CYMBALTA) 60 MG capsule Take 2 capsules (120 mg total) by mouth daily. 180 capsule 1   EPINEPHrine 0.3 mg/0.3 mL IJ SOAJ injection Inject 0.3 mg into the muscle once as needed (anaphylaxis). 1 each 1   levonorgestrel (MIRENA) 20 MCG/DAY IUD 1 each by Intrauterine route once.     Melatonin 1 MG CAPS Take 3 mg by mouth at bedtime.     Multiple Vitamin (MULTI-VITAMIN) tablet Take 1 tablet by mouth  daily.     triamcinolone 0.5%-Eucerin equivalent 1:1 cream mixture Apply topically 2 (two) times daily. Use a one-to-one mixture of triamcinolone 0.5% cream and Eucerin. 454 g 11   Vitamin D, Ergocalciferol, (DRISDOL) 1.25 MG (50000 UNIT) CAPS capsule Take 1 capsule (50,000 Units total) by mouth every 7 (seven) days. 12 capsule 1   amphetamine-dextroamphetamine (ADDERALL XR) 20 MG 24 hr capsule Take 1 capsule (20 mg total) by mouth in the morning. 30 capsule 0   triamcinolone ointment (KENALOG) 0.1 % Apply 1 application topically 2 (two) times daily as needed (dry skin).     No facility-administered medications prior to visit.    Allergies  Allergen Reactions   Cinnamon Shortness Of Breath and Rash    Wheezing    Guaifenesin-Codeine Itching   Morphine Itching, Rash and Hives    Other reaction(s): Other (See Comments) BEHAVIOR CHANGES Big mood changes BEHAVIOR CHANGES irritable   Nutmeg Oil (Myristica Oil) Shortness Of Breath and Rash    wheezing   Shellfish Allergy Hives, Swelling and Anaphylaxis    Throat closes   Shrimp Extract Allergy Skin Test     Other reaction(s): Wheezing   Codeine Hives and Itching    Pt says she can take percocet   Clonazepam Itching   Menthol Hives   Robitussin A-C [Guaifenesin-Codeine] Itching   Tramadol Itching   Yeast Hives    ROS Review of Systems    Objective:    Physical Exam Vitals reviewed.  Constitutional:      Appearance: She is well-developed.  HENT:     Head: Normocephalic and atraumatic.  Eyes:     Conjunctiva/sclera: Conjunctivae normal.  Cardiovascular:     Rate and Rhythm: Normal rate.  Pulmonary:     Effort: Pulmonary effort is normal.  Skin:    General: Skin is dry.  Neurological:     Mental Status: She is alert and oriented to person, place, and time.  Psychiatric:        Behavior: Behavior normal.    BP (!) 144/82    Pulse (!) 43    Temp 98.2 F (36.8 C)    Resp 16    Ht 5' 5"  (1.651 m)    Wt 265 lb (120.2  kg)    SpO2 97%    BMI 44.10 kg/m  Wt Readings from Last 3 Encounters:  10/03/21 265 lb (120.2 kg)  07/28/21 276 lb (125.2 kg)  07/19/21 277 lb 3.2 oz (125.7 kg)     Health Maintenance Due  Topic Date Due   Hepatitis C Screening  Never done    There are no preventive care reminders to display for this patient.  Lab Results  Component Value Date   TSH 1.84 12/22/2020   Lab Results  Component Value Date   WBC 7.4 07/28/2021   HGB 13.2 07/28/2021   HCT 39.8 07/28/2021   MCV 91.5 07/28/2021   PLT 257 07/28/2021   Lab Results  Component Value Date   NA 140 07/28/2021   K 4.1 07/28/2021   CO2 28 07/28/2021   GLUCOSE 106 (H) 07/28/2021   BUN 19 07/28/2021   CREATININE 0.61 07/28/2021   BILITOT 0.4 07/28/2021   ALKPHOS 83 07/28/2021   AST 16 07/28/2021   ALT 23 07/28/2021   PROT 6.8 07/28/2021   ALBUMIN 3.9 07/28/2021   CALCIUM 9.6 07/28/2021   ANIONGAP 7 07/28/2021   Lab Results  Component Value Date   CHOL 193 04/20/2020   Lab Results  Component Value Date   HDL 48 04/20/2020   Lab Results  Component Value Date   LDLCALC 127 (H) 04/20/2020   Lab Results  Component Value Date   TRIG 98 04/20/2020   No results found for: CHOLHDL Lab Results  Component Value Date   HGBA1C 5.8 (H) 04/20/2020      Assessment & Plan:   Problem List Items Addressed This Visit       Other   BMI 40.0-44.9, adult ([place])    She is doing great!! Hit her 50 lb mark!!! Keep up th egret work. So glad she is exercising regularly.       Relevant Medications   amphetamine-dextroamphetamine (ADDERALL XR) 20 MG 24 hr capsule (Start on 12/02/2021)   amphetamine-dextroamphetamine (ADDERALL XR) 20 MG 24 hr capsule (Start on 11/02/2021)   amphetamine-dextroamphetamine (ADDERALL XR) 20 MG 24 hr capsule   Attention deficit hyperactivity disorder (ADHD), combined type - Primary    Well controlled. Continue current regimen. Follow up in  6 mo       Relevant Medications    amphetamine-dextroamphetamine (ADDERALL XR) 20 MG 24 hr capsule (Start on 12/02/2021)   At high risk for breast cancer    myRisk hereditary cancer genetic screening shows that she is negative for any clinically significant mutation.  But her lifetime remaining risk is 37.5% which is considered significant.  No variants of uncertain significance identified.  Current guidelines recommend annual mammography and annual breast MRI with contrast as well as clinical breast exam every 6 to 12 months.  Also recommend colonoscopy every 10 years to age 31.   Reviewed results with her today.  She we will fax over a copy of the results to Dr. Tressia Danas.  To see if she would be willing to set up the yearly MRI.  If not we are more than happy to take on that and get it approved with the insurance.  She has an appointment coming up with Dr. Tressia Danas in February and so we will discuss it then.  The plan would be for her to get her mammogram in February and then her breast MRI in August.       Meds ordered this encounter  Medications   amphetamine-dextroamphetamine (ADDERALL XR) 20 MG 24 hr capsule    Sig: Take 1 capsule (20 mg total) by mouth in the morning.    Dispense:  30 capsule    Refill:  0  amphetamine-dextroamphetamine (ADDERALL XR) 20 MG 24 hr capsule    Sig: Take 1 capsule (20 mg total) by mouth every morning.    Dispense:  30 capsule    Refill:  0   amphetamine-dextroamphetamine (ADDERALL XR) 20 MG 24 hr capsule    Sig: Take 1 capsule (20 mg total) by mouth every morning.    Dispense:  30 capsule    Refill:  0    Follow-up: No follow-ups on file.    Beatrice Lecher, MD

## 2021-10-03 NOTE — Assessment & Plan Note (Signed)
She is doing great!! Hit her 50 lb mark!!! Keep up th egret work. So glad she is exercising regularly.

## 2021-10-03 NOTE — Assessment & Plan Note (Addendum)
myRisk hereditary cancer genetic screening shows that she is negative for any clinically significant mutation.  But her lifetime remaining risk is 37.5% which is considered significant.  No variants of uncertain significance identified.  Current guidelines recommend annual mammography and annual breast MRI with contrast as well as clinical breast exam every 6 to 12 months.  Also recommend colonoscopy every 10 years to age 46.   Reviewed results with her today.  She we will fax over a copy of the results to Dr. Tressia Danas.  To see if she would be willing to set up the yearly MRI.  If not we are more than happy to take on that and get it approved with the insurance.  She has an appointment coming up with Dr. Tressia Danas in February and so we will discuss it then.  The plan would be for her to get her mammogram in February and then her breast MRI in August.

## 2021-10-03 NOTE — Assessment & Plan Note (Signed)
Well controlled. Continue current regimen. Follow up in  6 mo  

## 2021-10-05 ENCOUNTER — Encounter: Payer: Self-pay | Admitting: Family Medicine

## 2021-10-10 ENCOUNTER — Encounter: Payer: Self-pay | Admitting: Family Medicine

## 2021-10-10 ENCOUNTER — Ambulatory Visit (INDEPENDENT_AMBULATORY_CARE_PROVIDER_SITE_OTHER): Payer: 59 | Admitting: Family Medicine

## 2021-10-10 ENCOUNTER — Other Ambulatory Visit: Payer: Self-pay

## 2021-10-10 VITALS — BP 135/64 | HR 87 | Temp 97.8°F | Ht 61.0 in | Wt 257.1 lb

## 2021-10-10 DIAGNOSIS — J0140 Acute pansinusitis, unspecified: Secondary | ICD-10-CM

## 2021-10-10 MED ORDER — AMOXICILLIN-POT CLAVULANATE 875-125 MG PO TABS: 1.0000 | ORAL_TABLET | Freq: Two times a day (BID) | ORAL | 0 refills | Status: AC

## 2021-10-10 MED ORDER — FLUCONAZOLE 150 MG PO TABS: 150.0000 mg | ORAL_TABLET | Freq: Once | ORAL | 1 refills | Status: AC

## 2021-10-10 MED ORDER — BENZONATATE 200 MG PO CAPS: 200.0000 mg | ORAL_CAPSULE | Freq: Two times a day (BID) | ORAL | 0 refills | Status: DC | PRN

## 2021-10-10 MED ORDER — GUAIFENESIN ER 600 MG PO TB12: 1200.0000 mg | ORAL_TABLET | Freq: Two times a day (BID) | ORAL | 2 refills | Status: DC

## 2021-10-10 NOTE — Progress Notes (Signed)
Acute Office Visit  Subjective:    Patient ID: Barbara Mcmahon, female    DOB: 1976/05/31, 46 y.o.   MRN: [id number]  Chief Complaint  Patient presents with   Cough    HPI Patient is in today for URI, cough.   [facility] and was diagnosed with URI/bronchitis. She was told to take Mucinex DM and given cough syrup (bromphen/pseudo/dextro Hbr syrup) but reports it was giving her insomnia and causing heart racing/palpitations. She stopped taking it bc it makes her feel horrible. States she was COVID and flu negative.  She reports she has had ongoing allergy symptoms for several weeks so difficult to tell when she may have started with URI symptoms, but she reports last Thursday she got significantly worse. She has been running low grade fevers around 100.79F and experiencing 6/10 right sided sinus pressure, ear pain, chills, body aches, nasal congestion, rhinorrhea, productive cough with dark yellow sputum. Reports she is feeling miserable and desperate for something to help with some relief.   She denies any chest pain, dyspnea, GI/GU symptoms, rashes.    Past Medical History:  Diagnosis Date   Abnormal Pap smear and cervical HPV (human papillomavirus)    Anxiety    Arthritis    BAck and sacral joint   Back pain    Colon polyps    Constipation    Gallbladder problem    Generalized anxiety disorder    GERD (gastroesophageal reflux disease)    IBS (irritable bowel syndrome)    Infertility, female    Joint pain    Multiple food allergies    Shellfish, cinnamon and yeast   Obesity    PCOS (polycystic ovarian syndrome)    Pneumonia    PONV (postoperative nausea and vomiting)    Prediabetes    Pulmonary embolism (HCC)    Seizure disorder (HCC)    Vitamin D deficiency     Past Surgical History:  Procedure Laterality Date   BREAST BIOPSY Right 2019   benign   CHOLECYSTECTOMY     COLPOSCOPY     DILATION AND CURETTAGE OF UTERUS     GYNECOLOGIC  CRYOSURGERY     KNEE SURGERY  right   UPPER GI ENDOSCOPY N/A 05/22/2021   Procedure: UPPER GI ENDOSCOPY;  Surgeon: [name], MD;  Location: [facility];  Service: General;  Laterality: N/A;    Family History  Problem Relation Age of Onset   Hypertension Mother    Depression Mother    Anxiety disorder Mother    Eating disorder Mother    Rheum arthritis Mother    Hypertension Father    Depression Father    Anxiety disorder Father    Sleep apnea Father    Obesity Father    Rheum arthritis Father    Breast cancer Sister        Bilat   Diabetes Paternal Grandmother    Hypertension Paternal Grandmother    Stroke Paternal Grandmother    Diabetes Paternal Grandfather    Hypertension Paternal Grandfather    Stroke Paternal Grandfather     Social History   Socioeconomic History   Marital status: Married    Spouse name: Marvin Maenza   Number of children: 2   Years of education: Not on file   Highest education level: Not on file  Occupational History   Occupation: Art gallery manager  Tobacco Use   Smoking status: Never   Smokeless tobacco: Never  Media planner  Vaping Use: Never used  Substance and Sexual Activity   Alcohol use: No   Drug use: No   Sexual activity: Not Currently  Other Topics Concern   Not on file  Social History Narrative   Not on file   Social Determinants of Health   Financial Resource Strain: Not on file  Food Insecurity: Not on file  Transportation Needs: Not on file  Physical Activity: Not on file  Stress: Not on file  Social Connections: Not on file  Intimate Partner Violence: Not on file    Outpatient Medications Prior to Visit  Medication Sig Dispense Refill   [START ON 12/02/2021] amphetamine-dextroamphetamine (ADDERALL XR) 20 MG 24 hr capsule Take 1 capsule (20 mg total) by mouth in the morning. 30 capsule 0   [START ON 11/02/2021] amphetamine-dextroamphetamine (ADDERALL XR) 20 MG 24 hr capsule Take 1 capsule (20 mg total)  by mouth every morning. 30 capsule 0   amphetamine-dextroamphetamine (ADDERALL XR) 20 MG 24 hr capsule Take 1 capsule (20 mg total) by mouth every morning. 30 capsule 0   amphetamine-dextroamphetamine (ADDERALL) 10 MG tablet Take 1 tablet (10 mg total) by mouth daily as needed (ADHD).     apixaban (ELIQUIS) 5 MG TABS tablet Take 1 tablet (5 mg total) by mouth 2 (two) times daily. 60 tablet 3   DULoxetine (CYMBALTA) 60 MG capsule Take 2 capsules (120 mg total) by mouth daily. 180 capsule 1   EPINEPHrine 0.3 mg/0.3 mL IJ SOAJ injection Inject 0.3 mg into the muscle once as needed (anaphylaxis). 1 each 1   levonorgestrel (MIRENA) 20 MCG/DAY IUD 1 each by Intrauterine route once.     Melatonin 1 MG CAPS Take 3 mg by mouth at bedtime.     Multiple Vitamin (MULTI-VITAMIN) tablet Take 1 tablet by mouth daily.     triamcinolone 0.5%-Eucerin equivalent 1:1 cream mixture Apply topically 2 (two) times daily. Use a one-to-one mixture of triamcinolone 0.5% cream and Eucerin. 454 g 11   Vitamin D, Ergocalciferol, (DRISDOL) 1.25 MG (50000 UNIT) CAPS capsule Take 1 capsule (50,000 Units total) by mouth every 7 (seven) days. 12 capsule 1   No facility-administered medications prior to visit.    Allergies  Allergen Reactions   Cinnamon Shortness Of Breath and Rash    Wheezing    Guaifenesin-Codeine Itching   Morphine Itching, Rash and Hives    Other reaction(s): Other (See Comments) BEHAVIOR CHANGES Big mood changes BEHAVIOR CHANGES irritable   Nutmeg Oil (Myristica Oil) Shortness Of Breath and Rash    wheezing   Shellfish Allergy Hives, Swelling and Anaphylaxis    Throat closes   Shrimp Extract Allergy Skin Test     Other reaction(s): Wheezing   Codeine Hives and Itching    Pt says she can take percocet   Clonazepam Itching   Menthol Hives   Robitussin A-C [Guaifenesin-Codeine] Itching   Tramadol Itching   Yeast Hives    Review of Systems All review of systems negative except what is listed  in the HPI     Objective:    Physical Exam Vitals reviewed.  Constitutional:      Appearance: Normal appearance. She is obese.  HENT:     Head: Normocephalic and atraumatic.     Right Ear: Tympanic membrane normal.     Left Ear: Tympanic membrane normal.     Nose: Congestion and rhinorrhea present.     Mouth/Throat:     Mouth: Mucous membranes are moist.     Pharynx:  Oropharynx is clear.  Eyes:     Extraocular Movements: Extraocular movements intact.     Conjunctiva/sclera: Conjunctivae normal.  Cardiovascular:     Rate and Rhythm: Normal rate and regular rhythm.  Pulmonary:     Effort: Pulmonary effort is normal.     Breath sounds: Normal breath sounds. No wheezing, rhonchi or rales.  Musculoskeletal:     Cervical back: Normal range of motion and neck supple. No tenderness.  Lymphadenopathy:     Cervical: No cervical adenopathy.  Skin:    General: Skin is warm and dry.  Neurological:     General: No focal deficit present.     Mental Status: She is alert and oriented to person, place, and time. Mental status is at baseline.  Psychiatric:        Mood and Affect: Mood normal.        Behavior: Behavior normal.        Thought Content: Thought content normal.        Judgment: Judgment normal.    BP 135/64 (BP Location: Left Arm, Patient Position: Sitting, Cuff Size: Normal)    Pulse 87    Temp 97.8 F (36.6 C) (Oral)    Ht 5\' 1"  (1.549 m)    Wt 257 lb 1.9 oz (116.6 kg)    SpO2 96%    BMI 48.58 kg/m  Wt Readings from Last 3 Encounters:  10/10/21 257 lb 1.9 oz (116.6 kg)  10/03/21 265 lb (120.2 kg)  07/28/21 276 lb (125.2 kg)    Health Maintenance Due  Topic Date Due   Hepatitis C Screening  Never done    There are no preventive care reminders to display for this patient.   Lab Results  Component Value Date   TSH 1.84 12/22/2020   Lab Results  Component Value Date   WBC 7.4 07/28/2021   HGB 13.2 07/28/2021   HCT 39.8 07/28/2021   MCV 91.5 07/28/2021   PLT  257 07/28/2021   Lab Results  Component Value Date   NA 140 07/28/2021   K 4.1 07/28/2021   CO2 28 07/28/2021   GLUCOSE 106 (H) 07/28/2021   BUN 19 07/28/2021   CREATININE 0.61 07/28/2021   BILITOT 0.4 07/28/2021   ALKPHOS 83 07/28/2021   AST 16 07/28/2021   ALT 23 07/28/2021   PROT 6.8 07/28/2021   ALBUMIN 3.9 07/28/2021   CALCIUM 9.6 07/28/2021   ANIONGAP 7 07/28/2021   Lab Results  Component Value Date   CHOL 193 04/20/2020   Lab Results  Component Value Date   HDL 48 04/20/2020   Lab Results  Component Value Date   LDLCALC 127 (H) 04/20/2020   Lab Results  Component Value Date   TRIG 98 04/20/2020   No results found for: [facility] Lab Results  Component Value Date   HGBA1C 5.8 (H) 04/20/2020       Assessment & Plan:    1. Acute non-recurrent pansinusitis Given severity of symptoms, will go ahead and treat with Augmentin. Adding in plain Mucinex and benzonatate. List of OTC medications added to AVS. Educated her that it could be the DM component of what she was taking that made her feel so bad, be cautious regarding this ingredient. Adding Diflucan as she often has yeast infections following antibiotic use. Continue supportive measures including rest, hydration, humidifier use, steam showers, warm compresses to sinuses, warm liquids with lemon and honey, and over-the-counter cough, cold, and analgesics as needed.  Patient aware of signs/symptoms  requiring further/urgent evaluation.   - amoxicillin-clavulanate (AUGMENTIN) 875-125 MG tablet; Take 1 tablet by mouth 2 (two) times daily for 7 days.  Dispense: 14 tablet; Refill: 0 - fluconazole (DIFLUCAN) 150 MG tablet; Take 1 tablet (150 mg total) by mouth once for 1 dose. Repeat in 3 days if no improvement  Dispense: 1 tablet; Refill: 1 - guaiFENesin (MUCINEX) 600 MG 12 hr tablet; Take 2 tablets (1,200 mg total) by mouth 2 (two) times daily.  Dispense: 30 tablet; Refill: 2 - benzonatate (TESSALON) 200 MG capsule; Take  1 capsule (200 mg total) by mouth 2 (two) times daily as needed for cough.  Dispense: 20 capsule; Refill: 0   Follow-up if symptoms worsen or fail to improve.    Terrilyn Saver, NP

## 2021-10-10 NOTE — Patient Instructions (Signed)
Over the counter medications that may be helpful for symptoms:  Guaifenesin 1200 mg extended release tabs twice daily, with plenty of water For cough and congestion Brand name: Mucinex   Pseudoephedrine 30 mg, one or two tabs every 4 to 6 hours For sinus congestion Brand name: Sudafed You must get this from the pharmacy counter.  Oxymetazoline nasal spray each morning, one spray in each nostril, for NO MORE THAN 3 days  For nasal and sinus congestion Brand name: Afrin Saline nasal spray or Saline Nasal Irrigation 3-5 times a day For nasal and sinus congestion Brand names: Ocean or AYR Fluticasone nasal spray, one spray in each nostril, each morning (after oxymetazoline and saline, if used) For nasal and sinus congestion Brand name: Flonase Warm salt water gargles  For sore throat Every few hours as needed Alternate ibuprofen 400-600 mg and acetaminophen 1000 mg every 4-6 hours For fever, body aches, headache Brand names: Motrin or Advil and Tylenol Dextromethorphan 12-hour cough version 30 mg every 12 hours  For cough Brand name: Delsym Stop all other cold medications for now (Nyquil, Dayquil, Tylenol Cold, Theraflu, etc) and other non-prescription cough/cold preparations. Many of these have the same ingredients listed above and could cause an overdose of medication.   Herbal treatments that have been shown to be helpful in some patients include: Vitamin C 1000mg  per day Vitamin D 4000iU per day Zinc 100mg  per day Quercetin 25-500mg  twice a day Melatonin 5-10mg  at bedtime  General Instructions Allow your body to rest Drink PLENTY of fluids Isolate yourself from everyone, even family, until test results have returned   If you develop severe shortness of breath, uncontrolled fevers, coughing up blood, confusion, chest pain, or signs of dehydration (such as significantly decreased urine amounts or dizziness with standing) please go to the ER.

## 2021-10-17 ENCOUNTER — Encounter: Payer: 59 | Attending: General Surgery | Admitting: Skilled Nursing Facility1

## 2021-10-17 LAB — LIPID PANEL W/REFLEX DIRECT LDL
Cholesterol: 157 mg/dL (ref <200)
HDL: 37 mg/dL — ABNORMAL LOW (ref >=50)
LDL Cholesterol (Calc): 102 mg/dL (calc) — ABNORMAL HIGH
Non-HDL Cholesterol (Calc): 120 mg/dL (calc) (ref <130)
Total CHOL/HDL Ratio: 4.2 (calc) (ref <5.0)
Triglycerides: 85 mg/dL (ref <150)

## 2021-10-17 LAB — VITAMIN D 25 HYDROXY (VIT D DEFICIENCY, FRACTURES): Vit D, 25-Hydroxy: 71 ng/mL (ref 30–100)

## 2021-10-17 NOTE — Progress Notes (Signed)
Barbara Mcmahon, vitamin D looks fantastic this time.  Up to 39 which is awesome.  Please continue your supplement.  Also your total cholesterol looks good.  LDL is just a little borderline and your HDL is low so just encourage you to continue to work on increasing activity level.

## 2021-10-18 ENCOUNTER — Telehealth: Payer: Self-pay

## 2021-10-18 ENCOUNTER — Encounter: Payer: Self-pay | Admitting: Family Medicine

## 2021-10-18 DIAGNOSIS — R051 Acute cough: Secondary | ICD-10-CM

## 2021-10-18 NOTE — Telephone Encounter (Signed)
Barbara Mcmahon called and left a message stating she is not better. She still has a productive cough and some shortness of breath. She wanted to know if an inhaler would help at this point. Please advise.

## 2021-10-19 MED ORDER — ALBUTEROL SULFATE HFA 108 (90 BASE) MCG/ACT IN AERS: 2.0000 | INHALATION_SPRAY | Freq: Four times a day (QID) | RESPIRATORY_TRACT | 11 refills | Status: DC | PRN

## 2021-10-19 NOTE — Addendum Note (Signed)
Addended by: Narda Rutherford on: 10/19/2021 09:59 AM   Modules accepted: Orders

## 2021-10-19 NOTE — Telephone Encounter (Signed)
Left message with recommendations.  

## 2021-10-22 ENCOUNTER — Other Ambulatory Visit: Payer: Self-pay | Admitting: Family Medicine

## 2021-10-23 ENCOUNTER — Encounter: Payer: 59 | Admitting: Skilled Nursing Facility1

## 2021-10-23 ENCOUNTER — Encounter: Payer: Self-pay | Admitting: Family Medicine

## 2021-10-23 ENCOUNTER — Other Ambulatory Visit: Payer: Self-pay

## 2021-10-23 NOTE — Progress Notes (Signed)
Bariatric Nutrition Follow-Up Visit Medical Nutrition Therapy   Surgery Date: 05/22/2021   NUTRITION ASSESSMENT    Surgery date: 05/22/2021 Surgery type: sleeve Start weight at [facility]: 311.2 Weight today: 253.6 pounds   Body Composition Scale 06/06/2021 07/19/2021 10/23/2021  Current Body Weight 292.2 277.2 253.6  Total Body Fat % 48.2 47.1 45  Visceral Fat 19 18 15   Fat-Free Mass % 51.7 52.8 54.9   Total Body Water % 40.3 40.9 41.9  Muscle-Mass lbs 32.9 32.8 32.5  BMI 48.3 45.8 41.9  Body Fat Displacement            Torso  lbs 87.5 81 70.8         Left Leg  lbs 17.5 16.2 14.1         Right Leg  lbs 17.5 16.2 14.1         Left Arm  lbs 8.7 8.1 7.0         Right Arm   lbs 8.7 8.1 7.0   Clinical  Medical hx: prediabetes, Anxiety, arthritis, seizures, colon polyps, IBS, PCOS, 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 Vitamin D Allergies: nutmeg oil, shellfish, shrimp, yeast, Cinnamon   Lifestyle & Dietary Hx  Pt state she had bronchitis which knocked her off her physical activity for a built but is excited to get back into it.  Pt states this has been pretty easy and feels fantastic!   Estimated daily fluid intake: 64-100 oz Estimated daily protein intake: 75+ g Supplements: procare multi + calcium Current average weekly physical activity: 60 minutes 3 times a week workout classes    24-Hr Dietary Recall First Meal: protein shake or protein bar Snack: greek yogurt + string cheese  Second Meal: chicken salad and spinach with balsalmic or beans + taco sauce Snack: harvest snap (less than the serving)  Third Meal: chicken + veggie or black bean burger and salad Snack: yogurt or cheese after gone to sleep or [place] bar Beverages: vitamin water, water, tea product  Post-Op Goals/ Signs/ Symptoms Using straws: no Drinking while eating: no Chewing/swallowing difficulties: no Changes  in vision: no Changes to mood/headaches: no Hair loss/changes to skin/nails: no Difficulty focusing/concentrating: no Sweating: no Dizziness/lightheadedness: no Palpitations: no  Carbonated/caffeinated beverages: no N/V/D/C/Gas: no Abdominal pain: no Dumping syndrome: no    NUTRITION DIAGNOSIS  Overweight/obesity ([place]-3.3) related to past poor dietary habits and physical inactivity as evidenced by completed bariatric surgery and following dietary guidelines for continued weight loss and healthy nutrition status.     NUTRITION INTERVENTION Nutrition counseling (C-1) and education (E-2) to facilitate bariatric surgery goals, including: The importance of consuming adequate calories as well as certain nutrients daily due to the body's need for essential vitamins, minerals, and fats The importance of daily physical activity and to reach a goal of at least 150 minutes of moderate to vigorous physical activity weekly (or as directed by their physician) due to benefits such as increased musculature and improved lab values The importance of intuitive eating specifically learning hunger-satiety cues and understanding the importance of learning a new body: The importance of mindful eating to avoid grazing behaviors  Why you need complex carbohydrates: Whole grains and other complex carbohydrates are required to have a healthy diet. Whole grains provide fiber which can help with blood glucose levels and help keep you satiated. Fruits and starchy vegetables provide essential vitamins and minerals required for immune function, eyesight support, brain support,  bone density, wound healing and many other functions within the body. According to the current evidenced based 2020-2025 Dietary Guidelines for Americans, complex carbohydrates are part of a healthy eating pattern which is associated with a decreased risk for type 2 diabetes, cancers, and cardiovascular disease.    Handouts Provided Include  Phase  4  Learning Style & Readiness for Change Teaching method utilized: Visual & Auditory  Demonstrated degree of understanding via: Teach Back  Readiness Level: action Barriers to learning/adherence to lifestyle change: none  RD's Notes for Next Visit Assess adherence to pt chosen goals    MONITORING & EVALUATION Dietary intake, weekly physical activity, body weight  Next Steps Patient is to follow-up in 3 months

## 2021-10-24 MED ORDER — AMPHETAMINE-DEXTROAMPHET ER 10 MG PO CP24: 20.0000 mg | ORAL_CAPSULE | Freq: Every day | ORAL | 0 refills | Status: DC

## 2021-11-05 ENCOUNTER — Encounter: Payer: Self-pay | Admitting: Family Medicine

## 2021-11-06 MED ORDER — DULOXETINE HCL 60 MG PO CPEP: 120.0000 mg | ORAL_CAPSULE | Freq: Every day | ORAL | 1 refills | Status: DC

## 2021-11-09 ENCOUNTER — Telehealth: Payer: Self-pay

## 2021-11-09 ENCOUNTER — Ambulatory Visit (INDEPENDENT_AMBULATORY_CARE_PROVIDER_SITE_OTHER): Payer: 59 | Admitting: Gastroenterology

## 2021-11-09 ENCOUNTER — Encounter: Payer: Self-pay | Admitting: Gastroenterology

## 2021-11-09 VITALS — BP 104/60 | HR 72 | Ht 65.0 in | Wt 258.1 lb

## 2021-11-09 DIAGNOSIS — Z9229 Personal history of other drug therapy: Secondary | ICD-10-CM

## 2021-11-09 DIAGNOSIS — Z86010 Personal history of colonic polyps: Secondary | ICD-10-CM

## 2021-11-09 DIAGNOSIS — Z800 Family history of malignant neoplasm of digestive organs: Secondary | ICD-10-CM | POA: Diagnosis not present

## 2021-11-09 DIAGNOSIS — Z8371 Family history of colonic polyps: Secondary | ICD-10-CM

## 2021-11-09 MED ORDER — NA SULFATE-K SULFATE-MG SULF 17.5-3.13-1.6 GM/177ML PO SOLN: 1.0000 | ORAL | 0 refills | Status: DC

## 2021-11-09 NOTE — Progress Notes (Signed)
Referring Provider: Dian Queen Primary Care Physician:  Hali Marry, MD   Reason for Consultation: Discuss colonoscopy   IMPRESSION:  Personal history of colon polyps    - initially identified on colonoscopy in college    - has been having surveillance colonoscopy every 5 years Long-term treatment with Eliquis after a PE Gastric bypass with Dr. Redmond Pulling 05/2021 IBS - diagnosed by Dr. [facility]) BMI 49.11    - currently working with Dr. Leafy Ro for weight management Prior cholecystectomy for cholelithiasis 2003 with post-prandial diarrhea    - controlled with daily greek yogurt Family history of colon cancer (paternal aunt at age 46) Family history of colon polyps (sister)  Colonoscopy recommended. I have recommended holding Eliquis for 2 days before endoscopy.  I discussed with the patient that there is a low, but real, risk of a cardiovascular event such as heart attack, stroke, or embolism/thrombosis while off Eliquis. Will communicate with Dr. Marin Olp to confirm that holding the Eliquis is appropriate at this time.    Discussed strategies to minimize nausea that may occur with the bowel prep given her recent gastric bypass including drinking slowing, walking, and she will use the Zofran SL that she has at home   PLAN: Colonoscopy after an Eliquis washout  I consented the patient at the bedside today discussing the risks, benefits, and alternatives to endoscopic evaluation. In particular, we discussed the risks that include, but are not limited to, reaction to medication, cardiopulmonary compromise, bleeding requiring blood transfusion, aspiration resulting in pneumonia, perforation requiring surgery, lack of diagnosis, severe illness requiring hospitalization, and even death. We reviewed the risk of missed lesion including polyps or even cancer. The patient acknowledges these risks and asks that we proceed.   HPI: Barbara Mcmahon is a 46 y.o.  returns in follow-up. She was originally seen in 2020 to discuss colonoscopy. She returns today requesting a colonoscopy. The interval history is obtained through the patient and review of her electronic health record. She has obesity, anxiety, vitamin D deficiency, polycystic ovarian syndrome and prediabetes. She is on Eliquis after a PE in 2018. She had gastric bypass surgery with Dr. Redmond Pulling in August 2022. She has lost 65 pounds since that time. She is working out regularly and working with a Engineer, maintenance (IT).   She is motivated to proceed with colon cancer screening given her family history. Paternal aunt with colon cancer at age 82. Sister with colon polyps.  Mother with IBS.   She has a personal history of blood in the stool during college. Evaluated by Dr. Ferdinand Lango with colonoscopy. Diagnosed with IBS and found to have polyps. Placed on the five year surveillance endoscopy plan.   Cholecystectomy for cholelithiasis with post-prandial stool dumping. Controlled by eating [place] yogurt. Does not feel this needs additional care.     Past Medical History:  Diagnosis Date   Abnormal Pap smear and cervical HPV (human papillomavirus)    Anxiety    Arthritis    BAck and sacral joint   Back pain    Colon polyps    Constipation    Gallbladder problem    Generalized anxiety disorder    GERD (gastroesophageal reflux disease)    IBS (irritable bowel syndrome)    Infertility, female    Joint pain    Multiple food allergies    Shellfish, cinnamon and yeast   Obesity    PCOS (polycystic ovarian syndrome)    Pneumonia    PONV (postoperative nausea and vomiting)  Prediabetes    Pulmonary embolism (HCC)    Seizure disorder ([place])    Vitamin D deficiency     Past Surgical History:  Procedure Laterality Date   BREAST BIOPSY Right 2019   benign   CHOLECYSTECTOMY     COLPOSCOPY     DILATION AND CURETTAGE OF UTERUS     GYNECOLOGIC CRYOSURGERY     KNEE SURGERY  right   UPPER GI ENDOSCOPY N/A  05/22/2021   Procedure: UPPER GI ENDOSCOPY;  Surgeon: [name], MD;  Location: [facility];  Service: General;  Laterality: N/A;    Current Outpatient Medications  Medication Sig Dispense Refill   acetaminophen (TYLENOL) 650 MG CR tablet Take 1,300 mg by mouth as needed for pain.     albuterol (VENTOLIN HFA) 108 (90 Base) MCG/ACT inhaler Inhale 2 puffs into the lungs every 6 (six) hours as needed for wheezing. 2 each 11   amphetamine-dextroamphetamine (ADDERALL XR) 20 MG 24 hr capsule Take 1 capsule (20 mg total) by mouth every morning. 30 capsule 0   amphetamine-dextroamphetamine (ADDERALL) 10 MG tablet Take 1 tablet (10 mg total) by mouth daily as needed (ADHD).     apixaban (ELIQUIS) 5 MG TABS tablet Take 1 tablet (5 mg total) by mouth 2 (two) times daily. 60 tablet 3   DULoxetine (CYMBALTA) 60 MG capsule Take 2 capsules (120 mg total) by mouth daily. 180 capsule 1   EPINEPHrine 0.3 mg/0.3 mL IJ SOAJ injection Inject 0.3 mg into the muscle once as needed (anaphylaxis). 1 each 1   levonorgestrel (MIRENA) 20 MCG/DAY IUD 1 each by Intrauterine route once.     Melatonin 10 MG CAPS Take 10 mg by mouth at bedtime.     Multiple Vitamin (MULTI-VITAMIN) tablet Take 1 tablet by mouth daily.     Na Sulfate-K Sulfate-Mg Sulf (SUPREP BOWEL PREP KIT) 17.5-3.13-1.6 GM/177ML SOLN Take 1 kit by mouth as directed. For colonoscopy prep 354 mL 0   triamcinolone 0.5%-Eucerin equivalent 1:1 cream mixture Apply topically 2 (two) times daily. Use a one-to-one mixture of triamcinolone 0.5% cream and Eucerin. 454 g 11   No current facility-administered medications for this visit.    Allergies as of 11/09/2021 - Review Complete 11/09/2021  Allergen Reaction Noted   Cinnamon Shortness Of Breath and Rash 07/15/2017   Guaifenesin-codeine Itching 07/08/2011   Morphine Itching, Rash, and Hives 07/08/2011   Nutmeg oil (myristica oil) Shortness Of Breath and Rash 10/30/2019   Shellfish allergy Hives, Swelling, and  Anaphylaxis 07/08/2011   Shrimp extract allergy skin test  09/26/2020   Codeine Hives and Itching 02/03/2011   Clonazepam Itching 05/20/2019   Menthol Hives 11/04/2020   Robitussin a-c [guaifenesin-codeine] Itching 07/08/2011   Tramadol Itching 10/15/2017   Yeast Hives 09/24/2016    Family History  Problem Relation Age of Onset   Hypertension Mother    Depression Mother    Anxiety disorder Mother    Eating disorder Mother    Rheum arthritis Mother    Hypertension Father    Depression Father    Anxiety disorder Father    Sleep apnea Father    Obesity Father    Rheum arthritis Father    Breast cancer Sister        Bilat   Diabetes Paternal Grandmother    Hypertension Paternal Grandmother    Stroke Paternal Grandmother    Diabetes Paternal Grandfather    Hypertension Paternal Grandfather    Stroke Paternal Grandfather    Colon cancer Paternal Aunt  Crohn's disease Cousin     Social History   Socioeconomic History   Marital status: Married    Spouse name: Lakeisha Waldrop   Number of children: 2   Years of education: Not on file   Highest education level: Not on file  Occupational History   Occupation: Art gallery manager  Tobacco Use   Smoking status: Never   Smokeless tobacco: Never  Vaping Use   Vaping Use: Never used  Substance and Sexual Activity   Alcohol use: No   Drug use: No   Sexual activity: Not Currently  Other Topics Concern   Not on file  Social History Narrative   Not on file   Social Determinants of Health   Financial Resource Strain: Not on file  Food Insecurity: Not on file  Transportation Needs: Not on file  Physical Activity: Not on file  Stress: Not on file  Social Connections: Not on file  Intimate Partner Violence: Not on file    Review of Systems: 12 system ROS is negative except as noted above.  Filed Weights   11/09/21 0934  Weight: 258 lb 2 oz (117.1 kg)    Physical Exam: Vital signs were  reviewed. General:   Alert, well-nourished, pleasant and cooperative in NAD Head:  Normocephalic and atraumatic. Eyes:  Sclera clear, no icterus.   Conjunctiva pink. Mouth:  No deformity or lesions.   Neck:  Supple; no thyromegaly. Lungs:  Clear throughout to auscultation.   No wheezes.  Heart:  Regular rate and rhythm; no murmurs Abdomen:  Soft, nontender, normal bowel sounds. No rebound or guarding. No hepatosplenomegaly. Well healed port scars.  Rectal:  Deferred  Msk:  Symmetrical without gross deformities. Extremities:  No gross deformities or edema. Neurologic:  Alert and  oriented x4;  grossly nonfocal Skin:  No rash or bruise. Psych:  Alert and cooperative. Normal mood and affect.   Jenniger Figiel L. Tarri Glenn, MD, MPH [facility] 11/09/2021, 11:34 AM

## 2021-11-09 NOTE — Patient Instructions (Addendum)
It was a pleasure to see you today. And, wow! Congratulations on your weight loss and successful recovery from surgery.   It is time for a colonoscopy. We will work with Dr. Marin Olp to hold your Eliquis prior to the procedure.   Tips for colonoscopy:  - Stay well hydrated for 3-4 days prior to the exam. This reduces nausea and dehydration.  - To prevent skin/hemorrhoid irritation - prior to wiping, put A&Dointment or vaseline on the toilet paper. - Keep a towel or pad on the bed.  - Trying to walk while you are drinking the prep - Drink  64oz of clear liquids in the morning of prep day (prior to starting the prep) to be sure that there is enough fluid to flush the colon and stay hydrated!!!! This is in addition to the fluids required for preparation. - I would use the Zofran that you have at home - using one before each portion of the prep - Use of a flavored hard candy, such as grape Anise Salvo, can counteract some of the flavor of the prep and may prevent some nausea.    We have sent the following medications to your pharmacy for you to pick up at your convenience: [place] have been scheduled for a colonoscopy. Please follow written instructions given to you at your visit today.  Please pick up your prep supplies at the pharmacy within the next 1-3 days. If you use inhalers (even only as needed), please bring them with you on the day of your procedure.  Thank you for choosing me and [facility].  Dr.Kimberly Beavers

## 2021-11-09 NOTE — Telephone Encounter (Signed)
Good Afternoon Barbara Mcmahon, This is a mutual patient of Dr. Marin Olp that we have scheduled for an upcoming endoscopy,can you help [name] get the clearance for patient to hold her Eliquis? Please see letter below.   Thank you for your help.     Dear Dr.Ennever,   We have scheduled the above named patient for a(n) Colon procedure. Our records show that (s)he is on anticoagulation therapy.  Please advise as to whether the patient may come off their therapy of Eliquis x2 days prior to their procedure which is scheduled for 11/29/21.  Please route your response to [facility] or fax response to [phone].  Sincerely,    [facility]

## 2021-11-15 NOTE — Telephone Encounter (Signed)
Barbara Mcmahon,   I did not get an answer. Pt isn't scheduled until 11/29/21 though with Dr. Tarri Glenn.

## 2021-11-16 NOTE — Telephone Encounter (Addendum)
Per Dr. Marin Olp patient can hold Eliquis x2 days prior to procedure. Pt has been informed and voiced understanding.   Medication Management (Eliquis ) (Newest Message First) Bartko, Rebekah Chesterfield, RN  You 16 hours ago (4:46 PM)   Hello.  Dr Marin Olp said that this is fine to hold her Eliquis for 2 days prior to the procedure.  Thank you.  Let us know if any other questions!

## 2021-11-20 ENCOUNTER — Other Ambulatory Visit: Payer: Self-pay

## 2021-11-20 ENCOUNTER — Ambulatory Visit (INDEPENDENT_AMBULATORY_CARE_PROVIDER_SITE_OTHER): Payer: 59 | Admitting: Family Medicine

## 2021-11-20 ENCOUNTER — Encounter: Payer: Self-pay | Admitting: Family Medicine

## 2021-11-20 VITALS — BP 132/80 | HR 74 | Resp 18 | Ht 65.0 in | Wt 258.0 lb

## 2021-11-20 DIAGNOSIS — F902 Attention-deficit hyperactivity disorder, combined type: Secondary | ICD-10-CM | POA: Diagnosis not present

## 2021-11-20 DIAGNOSIS — F411 Generalized anxiety disorder: Secondary | ICD-10-CM

## 2021-11-20 MED ORDER — AMPHETAMINE-DEXTROAMPHET ER 20 MG PO CP24: 20.0000 mg | ORAL_CAPSULE | ORAL | 0 refills | Status: DC

## 2021-11-20 NOTE — Assessment & Plan Note (Addendum)
Feels like she is doing really well on the Cymbalta currently on 120 mg and working with her therapist weekly.  She feels like things are really in a good place and is continuing to make some good progress.  She is getting to the gym twice a week.  Discussed some strategies around maybe adding in some walking on days when her kids have activities, and is a little harder to get to the gym.  Follow-up in 6 months.

## 2021-11-20 NOTE — Progress Notes (Signed)
Acute Office Visit  Subjective:    Patient ID: Barbara Mcmahon, female    DOB: Jun 29, 1976, 46 y.o.   MRN: [id number]  Chief Complaint  Patient presents with   Follow-up    Discuss appointment with psychiatrist.     HPI Patient is in today for medication management.  She is still following with her therapist weekly and is doing really well.  She feels like its been really helpful.  They are doing some EMDR.  She is currently on duloxetine 120 mg daily.  ADD - Reports symptoms are well controlled on current regime. Denies any problems with insomnia, chest pain, palpitations, or SOB.     Past Medical History:  Diagnosis Date   Abnormal Pap smear and cervical HPV (human papillomavirus)    Anxiety    Arthritis    BAck and sacral joint   Back pain    Colon polyps    Constipation    Gallbladder problem    Generalized anxiety disorder    GERD (gastroesophageal reflux disease)    IBS (irritable bowel syndrome)    Infertility, female    Joint pain    Multiple food allergies    Shellfish, cinnamon and yeast   Obesity    PCOS (polycystic ovarian syndrome)    Pneumonia    PONV (postoperative nausea and vomiting)    Prediabetes    Pulmonary embolism (HCC)    Seizure disorder (HCC)    Vitamin D deficiency     Past Surgical History:  Procedure Laterality Date   BREAST BIOPSY Right 2019   benign   CHOLECYSTECTOMY     COLPOSCOPY     DILATION AND CURETTAGE OF UTERUS     GYNECOLOGIC CRYOSURGERY     KNEE SURGERY  right   UPPER GI ENDOSCOPY N/A 05/22/2021   Procedure: UPPER GI ENDOSCOPY;  Surgeon: [name], MD;  Location: [facility];  Service: General;  Laterality: N/A;    Family History  Problem Relation Age of Onset   Hypertension Mother    Depression Mother    Anxiety disorder Mother    Eating disorder Mother    Rheum arthritis Mother    Hypertension Father    Depression Father    Anxiety disorder Father    Sleep apnea Father    Obesity Father    Rheum arthritis  Father    Breast cancer Sister        Bilat   Diabetes Paternal Grandmother    Hypertension Paternal Grandmother    Stroke Paternal Grandmother    Diabetes Paternal Grandfather    Hypertension Paternal Grandfather    Stroke Paternal Grandfather    Colon cancer Paternal Aunt    Crohn's disease Cousin     Social History   Socioeconomic History   Marital status: Married    Spouse name: Devonia Farro   Number of children: 2   Years of education: Not on file   Highest education level: Not on file  Occupational History   Occupation: Art gallery manager  Tobacco Use   Smoking status: Never   Smokeless tobacco: Never  Vaping Use   Vaping Use: Never used  Substance and Sexual Activity   Alcohol use: No   Drug use: No   Sexual activity: Not Currently  Other Topics Concern   Not on file  Social History Narrative   Not on file   Social Determinants of Health   Financial Resource Strain: Not on file  Food Insecurity: Not on file  Transportation Needs: Not on file  Physical Activity: Not on file  Stress: Not on file  Social Connections: Not on file  Intimate Partner Violence: Not on file    Outpatient Medications Prior to Visit  Medication Sig Dispense Refill   acetaminophen (TYLENOL) 650 MG CR tablet Take 1,300 mg by mouth as needed for pain.     albuterol (VENTOLIN HFA) 108 (90 Base) MCG/ACT inhaler Inhale 2 puffs into the lungs every 6 (six) hours as needed for wheezing. 2 each 11   amphetamine-dextroamphetamine (ADDERALL) 10 MG tablet Take 1 tablet (10 mg total) by mouth daily as needed (ADHD).     apixaban (ELIQUIS) 5 MG TABS tablet Take 1 tablet (5 mg total) by mouth 2 (two) times daily. 60 tablet 3   DULoxetine (CYMBALTA) 60 MG capsule Take 2 capsules (120 mg total) by mouth daily. 180 capsule 1   EPINEPHrine 0.3 mg/0.3 mL IJ SOAJ injection Inject 0.3 mg into the muscle once as needed (anaphylaxis). 1 each 1   levonorgestrel (MIRENA) 20 MCG/DAY IUD 1  each by Intrauterine route once.     Melatonin 10 MG CAPS Take 10 mg by mouth at bedtime.     Multiple Vitamin (MULTI-VITAMIN) tablet Take 1 tablet by mouth daily.     Na Sulfate-K Sulfate-Mg Sulf (SUPREP BOWEL PREP KIT) 17.5-3.13-1.6 GM/177ML SOLN Take 1 kit by mouth as directed. For colonoscopy prep 354 mL 0   triamcinolone 0.5%-Eucerin equivalent 1:1 cream mixture Apply topically 2 (two) times daily. Use a one-to-one mixture of triamcinolone 0.5% cream and Eucerin. 454 g 11   amphetamine-dextroamphetamine (ADDERALL XR) 20 MG 24 hr capsule Take 1 capsule (20 mg total) by mouth every morning. 30 capsule 0   No facility-administered medications prior to visit.    Allergies  Allergen Reactions   Cinnamon Shortness Of Breath and Rash    Wheezing    Guaifenesin-Codeine Itching   Morphine Itching, Rash and Hives    Other reaction(s): Other (See Comments) BEHAVIOR CHANGES Big mood changes BEHAVIOR CHANGES irritable   Nutmeg Oil (Myristica Oil) Shortness Of Breath and Rash    wheezing   Shellfish Allergy Hives, Swelling and Anaphylaxis    Throat closes   Shrimp Extract Allergy Skin Test     Other reaction(s): Wheezing   Codeine Hives and Itching    Pt says she can take percocet   Clonazepam Itching   Menthol Hives   Robitussin A-C [Guaifenesin-Codeine] Itching   Tramadol Itching   Yeast Hives    Review of Systems     Objective:    Physical Exam Vitals and nursing note reviewed.  Constitutional:      Appearance: She is well-developed.  HENT:     Head: Normocephalic and atraumatic.  Cardiovascular:     Rate and Rhythm: Normal rate and regular rhythm.     Heart sounds: Normal heart sounds.  Pulmonary:     Effort: Pulmonary effort is normal.     Breath sounds: Normal breath sounds.  Skin:    General: Skin is warm and dry.  Neurological:     Mental Status: She is alert and oriented to person, place, and time.  Psychiatric:        Behavior: Behavior normal.    BP  132/80    Pulse 74    Resp 18    Ht '5\' 5"'  (1.651 m)    Wt 258 lb (117 kg)    SpO2 99%    BMI 42.93 kg/m  Wt Readings  from Last 3 Encounters:  11/20/21 258 lb (117 kg)  11/09/21 258 lb 2 oz (117.1 kg)  10/23/21 253 lb 9.6 oz (115 kg)    Health Maintenance Due  Topic Date Due   Hepatitis C Screening  Never done   COLONOSCOPY (Pts 45-37yr Insurance coverage will need to be confirmed)  Never done    There are no preventive care reminders to display for this patient.   Lab Results  Component Value Date   TSH 1.84 12/22/2020   Lab Results  Component Value Date   WBC 7.4 07/28/2021   HGB 13.2 07/28/2021   HCT 39.8 07/28/2021   MCV 91.5 07/28/2021   PLT 257 07/28/2021   Lab Results  Component Value Date   NA 140 07/28/2021   K 4.1 07/28/2021   CO2 28 07/28/2021   GLUCOSE 106 (H) 07/28/2021   BUN 19 07/28/2021   CREATININE 0.61 07/28/2021   BILITOT 0.4 07/28/2021   ALKPHOS 83 07/28/2021   AST 16 07/28/2021   ALT 23 07/28/2021   PROT 6.8 07/28/2021   ALBUMIN 3.9 07/28/2021   CALCIUM 9.6 07/28/2021   ANIONGAP 7 07/28/2021   Lab Results  Component Value Date   CHOL 157 10/16/2021   Lab Results  Component Value Date   HDL 37 (L) 10/16/2021   Lab Results  Component Value Date   LDLCALC 102 (H) 10/16/2021   Lab Results  Component Value Date   TRIG 85 10/16/2021   Lab Results  Component Value Date   CHOLHDL 4.2 10/16/2021   Lab Results  Component Value Date   HGBA1C 5.8 (H) 04/20/2020       Assessment & Plan:   Problem List Items Addressed This Visit       Other   GAD (generalized anxiety disorder)    Feels like she is doing really well on the Cymbalta currently on 120 mg and working with her therapist weekly.  She feels like things are really in a good place and is continuing to make some good progress.  She is getting to the gym twice a week.  Discussed some strategies around maybe adding in some walking on days when her kids have activities, and is a  little harder to get to the gym.  Follow-up in 6 months.      Attention deficit hyperactivity disorder (ADHD), combined type - Primary    Doing well overall.  Happy with her current Adderall 20 mg extended release.  Good to go ahead and send over refills for the next 90 days.  If she has any problems at the pharmacy getting the medication please let me know there is a significant backorder on the drug.      Relevant Medications   amphetamine-dextroamphetamine (ADDERALL XR) 20 MG 24 hr capsule (Start on 12/17/2021)   amphetamine-dextroamphetamine (ADDERALL XR) 20 MG 24 hr capsule (Start on 01/16/2022)   amphetamine-dextroamphetamine (ADDERALL XR) 20 MG 24 hr capsule     Meds ordered this encounter  Medications   amphetamine-dextroamphetamine (ADDERALL XR) 20 MG 24 hr capsule    Sig: Take 1 capsule (20 mg total) by mouth every morning.    Dispense:  30 capsule    Refill:  0   amphetamine-dextroamphetamine (ADDERALL XR) 20 MG 24 hr capsule    Sig: Take 1 capsule (20 mg total) by mouth every morning.    Dispense:  30 capsule    Refill:  0   amphetamine-dextroamphetamine (ADDERALL XR) 20 MG 24 hr capsule  Sig: Take 1 capsule (20 mg total) by mouth every morning.    Dispense:  30 capsule    Refill:  0     Beatrice Lecher, MD

## 2021-11-20 NOTE — Assessment & Plan Note (Signed)
Doing well overall.  Happy with her current Adderall 20 mg extended release.  Good to go ahead and send over refills for the next 90 days.  If she has any problems at the pharmacy getting the medication please let me know there is a significant backorder on the drug.

## 2021-11-23 ENCOUNTER — Encounter: Payer: Self-pay | Admitting: Gastroenterology

## 2021-11-24 ENCOUNTER — Other Ambulatory Visit: Payer: Self-pay

## 2021-11-24 ENCOUNTER — Ambulatory Visit (INDEPENDENT_AMBULATORY_CARE_PROVIDER_SITE_OTHER): Payer: 59

## 2021-11-24 ENCOUNTER — Ambulatory Visit (INDEPENDENT_AMBULATORY_CARE_PROVIDER_SITE_OTHER): Payer: 59 | Admitting: Sports Medicine

## 2021-11-24 DIAGNOSIS — M1712 Unilateral primary osteoarthritis, left knee: Secondary | ICD-10-CM | POA: Diagnosis not present

## 2021-11-24 NOTE — Assessment & Plan Note (Addendum)
This is a pleasant 46 year old female, she has known knee osteoarthritis, last injected in October 2022, now with recurrence of pain, repeat injection today. She has been working out, she is lost a great deal of weight with bariatric surgery, she would like a session of physical therapy for some advice on learning how to Kinesiotape her knee.

## 2021-11-24 NOTE — Progress Notes (Signed)
° ° °  Procedures performed today:    Procedure: Real-time Ultrasound Guided injection of the left knee Device: Samsung HS60  Verbal informed consent obtained.  Time-out conducted.  Noted no overlying erythema, induration, or other signs of local infection.  Skin prepped in a sterile fashion.  Local anesthesia: Topical Ethyl chloride.  With sterile technique and under real time ultrasound guidance: Noted trace effusion, 1 cc Kenalog 40, 2 cc lidocaine, 2 cc bupivacaine injected easily Completed without difficulty  Advised to call if fevers/chills, erythema, induration, drainage, or persistent bleeding.  Images permanently stored and available for review in PACS.  Impression: Technically successful ultrasound guided injection.  Independent interpretation of notes and tests performed by another provider:   None.  Brief History, Exam, Impression, and Recommendations:    Primary osteoarthritis of left knee This is a pleasant 46 year old female, she has known knee osteoarthritis, last injected in October 2022, now with recurrence of pain, repeat injection today. She has been working out, she is lost a great deal of weight with bariatric surgery, she would like a session of physical therapy for some advice on learning how to Kinesiotape her knee.  Chronic process with exacerbation and pharmacologic intervention   ___________________________________________ Gwen Her. Dianah Field, M.D., ABFM., CAQSM. Primary Care and [place] In[employer] of [employer] [facility] [employer]

## 2021-11-26 ENCOUNTER — Encounter: Payer: Self-pay | Admitting: Sports Medicine

## 2021-11-26 DIAGNOSIS — M1712 Unilateral primary osteoarthritis, left knee: Secondary | ICD-10-CM

## 2021-11-27 ENCOUNTER — Encounter: Payer: Self-pay | Admitting: Gastroenterology

## 2021-11-27 NOTE — Assessment & Plan Note (Signed)
Persistent knee pain in spite of injection, significant weight loss and home physical therapy for greater than 6 weeks. Proceeding with MRI looking for meniscal tear. X-rays in the chart.

## 2021-11-28 ENCOUNTER — Encounter: Payer: Self-pay | Admitting: Certified Registered Nurse Anesthetist

## 2021-11-29 ENCOUNTER — Ambulatory Visit (AMBULATORY_SURGERY_CENTER): Payer: 59 | Admitting: Gastroenterology

## 2021-11-29 ENCOUNTER — Encounter: Payer: Self-pay | Admitting: Gastroenterology

## 2021-11-29 ENCOUNTER — Other Ambulatory Visit: Payer: Self-pay

## 2021-11-29 VITALS — BP 140/71 | HR 78 | Temp 97.1°F | Resp 15 | Ht 65.0 in | Wt 258.0 lb

## 2021-11-29 DIAGNOSIS — Z538 Procedure and treatment not carried out for other reasons: Secondary | ICD-10-CM | POA: Diagnosis not present

## 2021-11-29 DIAGNOSIS — Z86010 Personal history of colonic polyps: Secondary | ICD-10-CM

## 2021-11-29 DIAGNOSIS — Z800 Family history of malignant neoplasm of digestive organs: Secondary | ICD-10-CM

## 2021-11-29 DIAGNOSIS — Z8371 Family history of colonic polyps: Secondary | ICD-10-CM

## 2021-11-29 MED ORDER — SODIUM CHLORIDE 0.9 % IV SOLN: 500.0000 mL | Freq: Once | INTRAVENOUS | Status: DC

## 2021-11-29 NOTE — Patient Instructions (Signed)
YOU HAD AN ENDOSCOPIC PROCEDURE TODAY AT THE [facility]:   Refer to the procedure report that was given to you for any specific questions about what was found during the examination.  If the procedure report does not answer your questions, please call your gastroenterologist to clarify.  If you requested that your care partner not be given the details of your procedure findings, then the procedure report has been included in a sealed envelope for you to review at your convenience later.  YOU SHOULD EXPECT: Some feelings of bloating in the abdomen. Passage of more gas than usual.  Walking can help get rid of the air that was put into your GI tract during the procedure and reduce the bloating. If you had a lower endoscopy (such as a colonoscopy or flexible sigmoidoscopy) you may notice spotting of blood in your stool or on the toilet paper. If you underwent a bowel prep for your procedure, you may not have a normal bowel movement for a few days.  Please Note:  You might notice some irritation and congestion in your nose or some drainage.  This is from the oxygen used during your procedure.  There is no need for concern and it should clear up in a day or so.  SYMPTOMS TO REPORT IMMEDIATELY:   Following lower endoscopy (colonoscopy or flexible sigmoidoscopy):  Excessive amounts of blood in the stool  Significant tenderness or worsening of abdominal pains  Swelling of the abdomen that is new, acute  Fever of 100F or higher  For urgent or emergent issues, a gastroenterologist can be reached at any hour by calling [phone]. Do not use MyChart messaging for urgent concerns.    DIET:  We do recommend a small meal at first, but then you may proceed to your regular diet.  Drink plenty of fluids but you should avoid alcoholic beverages for 24 hours.  ACTIVITY:  You should plan to take it easy for the rest of today and you should NOT DRIVE or use heavy machinery until tomorrow (because  of the sedation medicines used during the test).    FOLLOW UP: Our staff will call the number listed on your records 48-72 hours following your procedure to check on you and address any questions or concerns that you may have regarding the information given to you following your procedure. If we do not reach you, we will leave a message.  We will attempt to reach you two times.  During this call, we will ask if you have developed any symptoms of COVID 19. If you develop any symptoms (ie: fever, flu-like symptoms, shortness of breath, cough etc.) before then, please call [phone].  If you test positive for Covid 19 in the 2 weeks post procedure, please call and report this information to us.    If any biopsies were taken you will be contacted by phone or by letter within the next 1-3 weeks.  Please call us at [phone] if you have not heard about the biopsies in 3 weeks.    SIGNATURES/CONFIDENTIALITY: You and/or your care partner have signed paperwork which will be entered into your electronic medical record.  These signatures attest to the fact that that the information above on your After Visit Summary has been reviewed and is understood.  Full responsibility of the confidentiality of this discharge information lies with you and/or your care-partner. 

## 2021-11-29 NOTE — Progress Notes (Signed)
Indication for colonoscopy:  - Personal history of colon polyps    - initially identified on colonoscopy in college    - has been having surveillance colonoscopy every 5 years - Family history of colon cancer (paternal aunt at age 47) - Family history of colon polyps (sister)  Please see the 11/09/21 office note for complete details. There has been no change in history or physical exam. The patient remains an appropriate candidate for monitored anesthesia care in the [place].

## 2021-11-29 NOTE — Progress Notes (Signed)
VS- Nash Mantis  Cell phone off per pt   No seizure activity since childhood

## 2021-11-29 NOTE — Progress Notes (Signed)
Report given to PACU, vss Poor prep.  Case stopped.

## 2021-11-29 NOTE — Op Note (Addendum)
Reading Patient Name: Barbara Mcmahon Procedure Date: 11/29/2021 10:56 AM MRN: [id number] Endoscopist: Thornton Park MD, MD Age: 46 Referring MD:  Date of Birth: 12/28/1975 Gender: Female Account #: [id number] Procedure:                Colonoscopy Indications:              High risk colon cancer surveillance: Personal                            history of colonic polyps. First polyps identified                            in college. Has been having surveillance                            colonoscopies every 5 years since that time.                           Paternal aunt with colon cancer at age 49. Sister                            with colon polyps. Medicines:                Monitored Anesthesia Care Procedure:                Pre-Anesthesia Assessment:                           - Prior to the procedure, a History and Physical                            was performed, and patient medications and                            allergies were reviewed. The patient's tolerance of                            previous anesthesia was also reviewed. The risks                            and benefits of the procedure and the sedation                            options and risks were discussed with the patient.                            All questions were answered, and informed consent                            was obtained. Prior Anticoagulants: The patient has                            taken Eliquis (apixaban), last dose was 2 days  prior to procedure. ASA Grade Assessment: III - A                            patient with severe systemic disease. After                            reviewing the risks and benefits, the patient was                            deemed in satisfactory condition to undergo the                            procedure.                           After obtaining informed consent, the colonoscope                            was passed  under direct vision. Throughout the                            procedure, the patient's blood pressure, pulse, and                            oxygen saturations were monitored continuously. The                            Colonoscope was introduced through the anus with                            the intention of advancing to the ileum. The scope                            was advanced to the ascending colon before the                            procedure was aborted. Medications were given. The                            colonoscopy was technically difficult and complex                            due to inadequate bowel prep. The patient tolerated                            the procedure well. The quality of the bowel                            preparation was inadequate. Scope In: 11:06:51 AM Scope Out: 11:13:25 AM Total Procedure Duration: 0 hours 6 minutes 34 seconds  Findings:                 The perianal and digital rectal examinations were  normal.                           Extensive amounts of stool was found in the entire                            colon, interfering with visualization. I was unable                            to evaluate for small, medium, and potentially                            large colon polyps today. Complications:            No immediate complications. Estimated Blood Loss:     Estimated blood loss: none. Impression:               - Preparation of the colon was inadequate.                           - Stool in the entire examined colon.                           - No specimens collected. Recommendation:           - Patient has a contact number available for                            emergencies. The signs and symptoms of potential                            delayed complications were discussed with the                            patient. Return to normal activities tomorrow.                            Written discharge  instructions were provided to the                            patient.                           - Resume previous diet.                           - Continue present medications.                           - Repeat colonoscopy at the next available                            appointment because the bowel preparation was poor.                            Plan two day bowel prep. Ideally, continue with  bowel prep today and reschedule for tomorrow as she                            has already been on an Eliquis washout.                           - Emerging evidence supports eating a diet of                            fruits, vegetables, grains, calcium, and yogurt                            while reducing red meat and alcohol may reduce the                            risk of colon cancer. Thornton Park MD, MD 11/29/2021 11:18:06 AM This report has been signed electronically.

## 2021-11-30 ENCOUNTER — Ambulatory Visit (AMBULATORY_SURGERY_CENTER): Payer: 59 | Admitting: Gastroenterology

## 2021-11-30 ENCOUNTER — Encounter: Payer: Self-pay | Admitting: Gastroenterology

## 2021-11-30 VITALS — BP 142/84 | HR 75 | Temp 96.6°F | Resp 15 | Ht 65.0 in | Wt 258.0 lb

## 2021-11-30 DIAGNOSIS — Z86010 Personal history of colonic polyps: Secondary | ICD-10-CM

## 2021-11-30 DIAGNOSIS — D127 Benign neoplasm of rectosigmoid junction: Secondary | ICD-10-CM | POA: Diagnosis not present

## 2021-11-30 DIAGNOSIS — Z800 Family history of malignant neoplasm of digestive organs: Secondary | ICD-10-CM

## 2021-11-30 DIAGNOSIS — D122 Benign neoplasm of ascending colon: Secondary | ICD-10-CM

## 2021-11-30 DIAGNOSIS — Z8371 Family history of colonic polyps: Secondary | ICD-10-CM | POA: Diagnosis not present

## 2021-11-30 DIAGNOSIS — D123 Benign neoplasm of transverse colon: Secondary | ICD-10-CM | POA: Diagnosis not present

## 2021-11-30 HISTORY — PX: COLONOSCOPY: SHX174

## 2021-11-30 HISTORY — PX: COLONOSCOPY WITH PROPOFOL: SHX5780

## 2021-11-30 MED ORDER — SODIUM CHLORIDE 0.9 % IV SOLN: 500.0000 mL | Freq: Once | INTRAVENOUS | Status: DC

## 2021-11-30 NOTE — Progress Notes (Signed)
GASTROENTEROLOGY PROCEDURE H&P NOTE   Primary Care Physician: Hali Marry, MD  HPI: Barbara Mcmahon is a 46 y.o. female who presents for Colonoscopy for screening.  Past Medical History:  Diagnosis Date   Abnormal Pap smear and cervical HPV (human papillomavirus)    Anxiety    Arthritis    BAck and sacral joint   Back pain    Colon polyps    Constipation    Gallbladder problem    Generalized anxiety disorder    GERD (gastroesophageal reflux disease)    IBS (irritable bowel syndrome)    Infertility, female    Joint pain    Multiple food allergies    Shellfish, cinnamon and yeast   Obesity    PCOS (polycystic ovarian syndrome)    Pneumonia    PONV (postoperative nausea and vomiting)    Prediabetes    Pulmonary embolism ([place])    2019   Seizure disorder ([place])    Vitamin D deficiency    Past Surgical History:  Procedure Laterality Date   BREAST BIOPSY Right 2019   benign   CHOLECYSTECTOMY     COLONOSCOPY     COLPOSCOPY     DILATION AND CURETTAGE OF UTERUS     GYNECOLOGIC CRYOSURGERY     KNEE SURGERY  right   UPPER GASTROINTESTINAL ENDOSCOPY     UPPER GI ENDOSCOPY N/A 05/22/2021   Procedure: UPPER GI ENDOSCOPY;  Surgeon: [name], MD;  Location: [facility];  Service: General;  Laterality: N/A;   Current Outpatient Medications  Medication Sig Dispense Refill   [START ON 01/16/2022] amphetamine-dextroamphetamine (ADDERALL XR) 20 MG 24 hr capsule Take 1 capsule (20 mg total) by mouth every morning. 30 capsule 0   amphetamine-dextroamphetamine (ADDERALL) 10 MG tablet Take 1 tablet (10 mg total) by mouth daily as needed (ADHD).     DULoxetine (CYMBALTA) 60 MG capsule Take 2 capsules (120 mg total) by mouth daily. 180 capsule 1   levonorgestrel (MIRENA) 20 MCG/DAY IUD 1 each by Intrauterine route once.     Melatonin 10 MG CAPS Take 10 mg by mouth at bedtime.     Multiple Vitamin (MULTI-VITAMIN) tablet Take 1 tablet by mouth daily.     Multiple  Vitamins-Minerals (BARIATRIC MULTIVITAMINS/IRON PO) Take by mouth daily.     acetaminophen (TYLENOL) 650 MG CR tablet Take 1,300 mg by mouth as needed for pain.     albuterol (VENTOLIN HFA) 108 (90 Base) MCG/ACT inhaler Inhale 2 puffs into the lungs every 6 (six) hours as needed for wheezing. 2 each 11   apixaban (ELIQUIS) 5 MG TABS tablet Take 1 tablet (5 mg total) by mouth 2 (two) times daily. 60 tablet 3   EPINEPHrine 0.3 mg/0.3 mL IJ SOAJ injection Inject 0.3 mg into the muscle once as needed (anaphylaxis). (Patient not taking: Reported on 11/30/2021) 1 each 1   triamcinolone 0.5%-Eucerin equivalent 1:1 cream mixture Apply topically 2 (two) times daily. Use a one-to-one mixture of triamcinolone 0.5% cream and Eucerin. (Patient not taking: Reported on 11/29/2021) 454 g 11   Current Facility-Administered Medications  Medication Dose Route Frequency Provider Last Rate Last Admin   0.9 %  sodium chloride infusion  500 mL Intravenous Once Mansouraty, Telford Nab., MD        Current Outpatient Medications:    [START ON 01/16/2022] amphetamine-dextroamphetamine (ADDERALL XR) 20 MG 24 hr capsule, Take 1 capsule (20 mg total) by mouth every morning., Disp: 30 capsule, Rfl: 0   amphetamine-dextroamphetamine (ADDERALL) 10 MG  tablet, Take 1 tablet (10 mg total) by mouth daily as needed (ADHD)., Disp: , Rfl:    DULoxetine (CYMBALTA) 60 MG capsule, Take 2 capsules (120 mg total) by mouth daily., Disp: 180 capsule, Rfl: 1   levonorgestrel (MIRENA) 20 MCG/DAY IUD, 1 each by Intrauterine route once., Disp: , Rfl:    Melatonin 10 MG CAPS, Take 10 mg by mouth at bedtime., Disp: , Rfl:    Multiple Vitamin (MULTI-VITAMIN) tablet, Take 1 tablet by mouth daily., Disp: , Rfl:    Multiple Vitamins-Minerals (BARIATRIC MULTIVITAMINS/IRON PO), Take by mouth daily., Disp: , Rfl:    acetaminophen (TYLENOL) 650 MG CR tablet, Take 1,300 mg by mouth as needed for pain., Disp: , Rfl:    albuterol (VENTOLIN HFA) 108 (90 Base)  MCG/ACT inhaler, Inhale 2 puffs into the lungs every 6 (six) hours as needed for wheezing., Disp: 2 each, Rfl: 11   apixaban (ELIQUIS) 5 MG TABS tablet, Take 1 tablet (5 mg total) by mouth 2 (two) times daily., Disp: 60 tablet, Rfl: 3   EPINEPHrine 0.3 mg/0.3 mL IJ SOAJ injection, Inject 0.3 mg into the muscle once as needed (anaphylaxis). (Patient not taking: Reported on 11/30/2021), Disp: 1 each, Rfl: 1   triamcinolone 0.5%-Eucerin equivalent 1:1 cream mixture, Apply topically 2 (two) times daily. Use a one-to-one mixture of triamcinolone 0.5% cream and Eucerin. (Patient not taking: Reported on 11/29/2021), Disp: 454 g, Rfl: 11  Current Facility-Administered Medications:    0.9 %  sodium chloride infusion, 500 mL, Intravenous, Once, Mansouraty, Telford Nab., MD Allergies  Allergen Reactions   Cinnamon Shortness Of Breath and Rash    Wheezing    Guaifenesin-Codeine Itching   Morphine Itching, Rash and Hives    Other reaction(s): Other (See Comments) BEHAVIOR CHANGES Big mood changes BEHAVIOR CHANGES irritable   Nutmeg Oil (Myristica Oil) Shortness Of Breath and Rash    wheezing   Shellfish Allergy Hives, Swelling and Anaphylaxis    Throat closes   Shrimp Extract Allergy Skin Test     Other reaction(s): Wheezing   Codeine Hives and Itching    Pt says she can take percocet   Clonazepam Itching   Menthol Hives   Robitussin A-C [Guaifenesin-Codeine] Itching   Tramadol Itching   Yeast Hives   Family History  Problem Relation Age of Onset   Hypertension Mother    Depression Mother    Anxiety disorder Mother    Eating disorder Mother    Rheum arthritis Mother    Hypertension Father    Depression Father    Anxiety disorder Father    Sleep apnea Father    Obesity Father    Rheum arthritis Father    Breast cancer Sister        Bilat   Colon cancer Paternal Aunt    Diabetes Paternal Grandmother    Hypertension Paternal Grandmother    Stroke Paternal Grandmother    Colon polyps  Paternal Grandfather    Diabetes Paternal Grandfather    Hypertension Paternal Grandfather    Stroke Paternal Grandfather    Crohn's disease Cousin    Esophageal cancer Neg Hx    Stomach cancer Neg Hx    Rectal cancer Neg Hx    Social History   Socioeconomic History   Marital status: Married    Spouse name: Deretha Ertle   Number of children: 2   Years of education: Not on file   Highest education level: Not on file  Occupational History   Occupation: Horticulturist, commercial  Nurse, learning disability  Tobacco Use   Smoking status: Never   Smokeless tobacco: Never  Vaping Use   Vaping Use: Never used  Substance and Sexual Activity   Alcohol use: No   Drug use: No   Sexual activity: Not Currently    Birth control/protection: I.U.D.  Other Topics Concern   Not on file  Social History Narrative   Not on file   Social Determinants of Health   Financial Resource Strain: Not on file  Food Insecurity: Not on file  Transportation Needs: Not on file  Physical Activity: Not on file  Stress: Not on file  Social Connections: Not on file  Intimate Partner Violence: Not on file    Physical Exam: Today's Vitals   11/30/21 1315  BP: 118/74  Pulse: 85  Temp: (!) 96.6 F (35.9 C)  TempSrc: Skin  SpO2: 98%  Weight: 258 lb (117 kg)  Height: 5\' 5"  (1.651 m)   Body mass index is 42.93 kg/m. GEN: NAD EYE: Sclerae anicteric ENT: MMM CV: Non-tachycardic GI: Soft, NT/ND NEURO:  Alert & Oriented x 3  Lab Results: No results for input(s): WBC, HGB, HCT, PLT in the last 72 hours. BMET No results for input(s): NA, K, CL, CO2, GLUCOSE, BUN, CREATININE, CALCIUM in the last 72 hours. LFT No results for input(s): PROT, ALBUMIN, AST, ALT, ALKPHOS, BILITOT, BILIDIR, IBILI in the last 72 hours. PT/INR No results for input(s): LABPROT, INR in the last 72 hours.   Impression / Plan: This is a 46 y.o.female who presents for Colonoscopy for screening.  The risks and benefits of endoscopic  evaluation/treatment were discussed with the patient and/or family; these include but are not limited to the risk of perforation, infection, bleeding, missed lesions, lack of diagnosis, severe illness requiring hospitalization, as well as anesthesia and sedation related illnesses.  The patient's history has been reviewed, patient examined, no change in status, and deemed stable for procedure.  The patient and/or family is agreeable to proceed.    Justice Britain, MD [facility] Advanced [facility]oscopy Office # [phone]

## 2021-11-30 NOTE — Progress Notes (Signed)
Called to room to assist during endoscopic procedure.  Patient ID and intended procedure confirmed with present staff. Received instructions for my participation in the procedure from the performing physician.  

## 2021-11-30 NOTE — Progress Notes (Signed)
Pt's states no medical or surgical changes since previsit or office visit. VS assessed by D.T 

## 2021-11-30 NOTE — Op Note (Addendum)
[facility] Patient Name: Barbara Mcmahon Procedure Date: 11/30/2021 2:03 PM MRN: [id number] Endoscopist: Justice Britain , MD Age: 46 Referring MD:  Date of Birth: 1975/10/14 Gender: Female Account #: [id number] Procedure:                Colonoscopy Indications:              Screening for colon cancer: Family history of                            colorectal cancer in distant relative(s), Colon                            cancer screening in patient at increased risk:                            Family history of 1st-degree relative with colon                            polyps, High risk colon cancer surveillance:                            Personal history of colonic polyps Medicines:                Monitored Anesthesia Care Procedure:                Pre-Anesthesia Assessment:                           - Prior to the procedure, a History and Physical                            was performed, and patient medications and                            allergies were reviewed. The patient's tolerance of                            previous anesthesia was also reviewed. The risks                            and benefits of the procedure and the sedation                            options and risks were discussed with the patient.                            All questions were answered, and informed consent                            was obtained. Prior Anticoagulants: The patient has                            taken Eliquis (apixaban), last dose was 2 days  prior to procedure. ASA Grade Assessment: III - A                            patient with severe systemic disease. After                            reviewing the risks and benefits, the patient was                            deemed in satisfactory condition to undergo the                            procedure.                           After obtaining informed consent, the colonoscope                             was passed under direct vision. Throughout the                            procedure, the patient's blood pressure, pulse, and                            oxygen saturations were monitored continuously. The                            CF [id number] #[id number] was introduced through the anus                            and advanced to the 5 cm into the ileum. The                            colonoscopy was somewhat difficult due to                            significant looping. Successful completion of the                            procedure was aided by changing the patient's                            position, using manual pressure, straightening and                            shortening the scope to obtain bowel loop reduction                            and using scope torsion. The patient tolerated the                            procedure. The quality of the bowel preparation was  adequate. The terminal ileum, ileocecal valve,                            appendiceal orifice, and rectum were photographed. Scope In: 2:09:23 PM Scope Out: 2:35:13 PM Scope Withdrawal Time: 0 hours 17 minutes 27 seconds  Total Procedure Duration: 0 hours 25 minutes 50 seconds  Findings:                 The digital rectal exam findings include                            hemorrhoids. Pertinent negatives include no                            palpable rectal lesions.                           The colon (entire examined portion) revealed                            significantly excessive looping.                           A moderate amount of semi-liquid stool was found in                            the entire colon, interfering with visualization.                            Lavage of the area was performed using copious                            amounts, resulting in clearance with adequate                            visualization.                           The terminal ileum and  ileocecal valve appeared                            normal.                           Three sessile polyps were found in the                            recto-sigmoid colon, transverse colon and ascending                            colon. The polyps were 2 to 6 mm in size. These                            polyps were removed with a cold snare. Resection  and retrieval were complete.                           Normal mucosa was found in the entire colon                            otherwise.                           Non-bleeding non-thrombosed external and internal                            hemorrhoids were found during retroflexion, during                            perianal exam and during digital exam. The                            hemorrhoids were Grade II (internal hemorrhoids                            that prolapse but reduce spontaneously). Complications:            No immediate complications. Estimated Blood Loss:     Estimated blood loss was minimal. Impression:               - Hemorrhoids found on digital rectal exam.                           - There was significant looping of the colon.                           - Stool in the entire examined colon.                           - The examined portion of the ileum was normal.                           - Three 2 to 6 mm polyps at the recto-sigmoid                            colon, in the transverse colon and in the ascending                            colon, removed with a cold snare. Resected and                            retrieved.                           - Normal mucosa in the entire examined colon                            otherwise.                           -  Non-bleeding non-thrombosed external and internal                            hemorrhoids. Recommendation:           - The patient will be observed post-procedure,                            until all discharge criteria are met.                            - Discharge patient to home.                           - Patient has a contact number available for                            emergencies. The signs and symptoms of potential                            delayed complications were discussed with the                            patient. Return to normal activities tomorrow.                            Written discharge instructions were provided to the                            patient.                           - High fiber diet.                           - Use FiberCon 1-2 tablets PO daily.                           - Restart Eliquis on 2/24 PM (24 hours from today)                            to decrease risk of post-interventional bleeding.                           - Continue present medications.                           - Await pathology results.                           - Repeat colonoscopy in 3 - 5 years for                            surveillance based on pathology results (3 years if  all tissue is adenomatous otherwise can continue                            q5-year colonoscopy as she has been doing). 2-day                            preparation and consideration of optimization of                            bowel habits in the future in regards to likely                            underlying constipation for next procedure.                           - The findings and recommendations were discussed                            with the patient.                           - The findings and recommendations were discussed                            with the patient's family. Justice Britain, MD 11/30/2021 2:43:20 PM

## 2021-11-30 NOTE — Progress Notes (Signed)
Pt non-responsive, VVS, Report to RN  °

## 2021-11-30 NOTE — Patient Instructions (Addendum)
Handouts on Polyps, high fiber diet, and hemorrhoids given to patient.  Please use FiberCon 1-2 tablets daily as needed.  Resume Eliquis tomorrow evening.  Await pathology results.     YOU HAD AN ENDOSCOPIC PROCEDURE TODAY AT [facility]:   Refer to the procedure report that was given to you for any specific questions about what was found during the examination.  If the procedure report does not answer your questions, please call your gastroenterologist to clarify.  If you requested that your care partner not be given the details of your procedure findings, then the procedure report has been included in a sealed envelope for you to review at your convenience later.  YOU SHOULD EXPECT: Some feelings of bloating in the abdomen. Passage of more gas than usual.  Walking can help get rid of the air that was put into your GI tract during the procedure and reduce the bloating. If you had a lower endoscopy (such as a colonoscopy or flexible sigmoidoscopy) you may notice spotting of blood in your stool or on the toilet paper. If you underwent a bowel prep for your procedure, you may not have a normal bowel movement for a few days.  Please Note:  You might notice some irritation and congestion in your nose or some drainage.  This is from the oxygen used during your procedure.  There is no need for concern and it should clear up in a day or so.  SYMPTOMS TO REPORT IMMEDIATELY:  Following lower endoscopy (colonoscopy or flexible sigmoidoscopy):  Excessive amounts of blood in the stool  Significant tenderness or worsening of abdominal pains  Swelling of the abdomen that is new, acute  Fever of 100F or higher  For urgent or emergent issues, a gastroenterologist can be reached at any hour by calling [phone]. Do not use MyChart messaging for urgent concerns.    DIET:  We do recommend a small meal at first, but then you may proceed to your regular diet.  Drink plenty of fluids  but you should avoid alcoholic beverages for 24 hours.  ACTIVITY:  You should plan to take it easy for the rest of today and you should NOT DRIVE or use heavy machinery until tomorrow (because of the sedation medicines used during the test).    FOLLOW UP: Our staff will call the number listed on your records 48-72 hours following your procedure to check on you and address any questions or concerns that you may have regarding the information given to you following your procedure. If we do not reach you, we will leave a message.  We will attempt to reach you two times.  During this call, we will ask if you have developed any symptoms of COVID 19. If you develop any symptoms (ie: fever, flu-like symptoms, shortness of breath, cough etc.) before then, please call [phone].  If you test positive for Covid 19 in the 2 weeks post procedure, please call and report this information to [name].    If any biopsies were taken you will be contacted by phone or by letter within the next 1-3 weeks.  Please call us at [phone] if you have not heard about the biopsies in 3 weeks.    SIGNATURES/CONFIDENTIALITY: You and/or your care partner have signed paperwork which will be entered into your electronic medical record.  These signatures attest to the fact that that the information above on your After Visit Summary has been reviewed and is understood.  Full responsibility  of the confidentiality of this discharge information lies with you and/or your care-partner.

## 2021-12-04 ENCOUNTER — Telehealth: Payer: Self-pay | Admitting: *Deleted

## 2021-12-04 NOTE — Telephone Encounter (Signed)
°  Follow up Call-  Call back number 11/30/2021 11/29/2021  Post procedure Call Back phone  # [phone]  Permission to leave phone message Yes Yes  Some recent data might be hidden     Patient questions:  Do you have a fever, pain , or abdominal swelling? No. Pain Score  0 *  Have you tolerated food without any problems? Yes.    Have you been able to return to your normal activities? Yes.    Do you have any questions about your discharge instructions: Diet   No. Medications  No. Follow up visit  No.  Do you have questions or concerns about your Care? No.  Actions: * If pain score is 4 or above: No action needed, pain <4.

## 2021-12-06 ENCOUNTER — Encounter: Payer: Self-pay | Admitting: Gastroenterology

## 2021-12-08 ENCOUNTER — Encounter: Payer: Self-pay | Admitting: Physical Therapy

## 2021-12-08 ENCOUNTER — Ambulatory Visit: Payer: 59 | Attending: Sports Medicine | Admitting: Physical Therapy

## 2021-12-08 ENCOUNTER — Other Ambulatory Visit: Payer: Self-pay

## 2021-12-08 DIAGNOSIS — M6281 Muscle weakness (generalized): Secondary | ICD-10-CM | POA: Diagnosis present

## 2021-12-08 DIAGNOSIS — M25562 Pain in left knee: Secondary | ICD-10-CM | POA: Diagnosis not present

## 2021-12-08 DIAGNOSIS — R252 Cramp and spasm: Secondary | ICD-10-CM | POA: Insufficient documentation

## 2021-12-08 DIAGNOSIS — G8929 Other chronic pain: Secondary | ICD-10-CM | POA: Diagnosis present

## 2021-12-08 DIAGNOSIS — M1712 Unilateral primary osteoarthritis, left knee: Secondary | ICD-10-CM | POA: Insufficient documentation

## 2021-12-08 NOTE — Therapy (Signed)
[facility] [place] [address] [place], [place], [address] Phone: [phone]   Fax:  [phone]  Physical Therapy Evaluation  Patient Details  Name: Barbara Mcmahon MRN: [id number] Date of Birth: 01-22-1976 Referring Provider (PT): Thekkekandam   Encounter Date: 12/08/2021   PT End of Session - 12/08/21 0842     Visit Number 1    Number of Visits 8    Date for PT Re-Evaluation 01/05/22    Authorization Type UHC    PT Start Time 0845    PT Stop Time 0935    PT Time Calculation (min) 50 min    Activity Tolerance Patient tolerated treatment well    Behavior During Therapy [facility] for tasks assessed/performed             Past Medical History:  Diagnosis Date   Abnormal Pap smear and cervical HPV (human papillomavirus)    Anxiety    Arthritis    BAck and sacral joint   Back pain    Colon polyps    Constipation    Gallbladder problem    Generalized anxiety disorder    GERD (gastroesophageal reflux disease)    IBS (irritable bowel syndrome)    Infertility, female    Joint pain    Multiple food allergies    Shellfish, cinnamon and yeast   Obesity    PCOS (polycystic ovarian syndrome)    Pneumonia    PONV (postoperative nausea and vomiting)    Prediabetes    Pulmonary embolism (HCC)    2019   Seizure disorder (HCC)    Vitamin D deficiency     Past Surgical History:  Procedure Laterality Date   BREAST BIOPSY Right 2019   benign   CHOLECYSTECTOMY     COLONOSCOPY  11/30/2021   colonoscopy every 5 yrs since hx of polyps around age 51   COLPOSCOPY     DILATION AND CURETTAGE OF UTERUS     GYNECOLOGIC CRYOSURGERY     KNEE SURGERY  right   UPPER GASTROINTESTINAL ENDOSCOPY     UPPER GI ENDOSCOPY N/A 05/22/2021   Procedure: UPPER GI ENDOSCOPY;  Surgeon: Gaynelle Adu, MD;  Location: [facility];  Service: General;  Laterality: N/A;    There were no vitals filed for this visit.    Subjective Assessment - 12/08/21  0847     Subjective Gastric bypass in August and has been working out since.She began having knee pain in the fall. Injection helped but pain returned and second injection did not help.  In 2000, tore meniscus and had arthroscopic surgery. Having MRI tomorrow. It was only painful when doing squats, but now it's a constant deep pain all the time. Hurts in medial knee.    Pertinent History PE 2019 (can't take NSAIDS), seizures (when she was a baby), h/o of left medial meniscus tear and scope    Diagnostic tests MRI tomorrow    Patient Stated Goals get rid of pain so she can continue to work out and lose wt    Currently in Pain? Yes    Pain Score 6     Pain Location Knee    Pain Orientation Left;Medial    Pain Descriptors / Indicators Aching;Shooting    Pain Type Acute pain    Pain Onset More than a month ago    Pain Frequency Constant    Aggravating Factors  squats, inclines    Pain Relieving Factors nothing  [facility] PT Assessment - 12/08/21 0001       Assessment   Medical Diagnosis primary OA of left knee    Referring Provider (PT) Thekkekandam    Onset Date/Surgical Date 06/22/22    Hand Dominance Right      Precautions   Precautions None    Precaution Comments h/o seizures when she was a baby      Restrictions   Weight Bearing Restrictions No      Balance Screen   Has the patient fallen in the past 6 months No    Has the patient had a decrease in activity level because of a fear of falling?  No    Is the patient reluctant to leave their home because of a fear of falling?  No      Home Tourist information centre manager residence    Additional Comments painful both directions on stairs      Prior Function   Level of Independence Independent    Vocation Full time employment    Vocation Requirements sits at computer but up and down frequently      Posture/Postural Control   Posture Comments mild atrophy left calf      ROM / Strength   AROM /  PROM / Strength AROM;Strength      AROM   Overall AROM Comments Rt knee 0-132    AROM Assessment Site Knee    Right/Left Knee Left    Left Knee Extension 0    Left Knee Flexion 124      Strength   Strength Assessment Site Knee;Hip    Right/Left Hip Right;Left    Right Hip Flexion 4/5    Right Hip Extension 5/5    Right Hip ABduction 5/5    Left Hip Flexion 4/5    Left Hip Extension 4+/5    Left Hip ABduction 5/5    Right/Left Knee Right;Left    Right Knee Flexion 4+/5    Right Knee Extension 5/5    Left Knee Flexion 4+/5    Left Knee Extension 4+/5      Flexibility   Soft Tissue Assessment /Muscle Length yes    Hamstrings bil tightness R>L    Quadriceps marked bil tightness to approximately 90 deg prone    ITB WNL bil    Piriformis left mild tighness      Palpation   Palpation comment marked tenderness of left hip ADDuctors      Special Tests   Other special tests negative Apley Left knee                        Objective measurements completed on examination: See above findings.       [facility] Adult PT Treatment/Exercise - 12/08/21 0001       Manual Therapy   Manual Therapy Taping    Kinesiotex Create Space;Facilitate Muscle      Kinesiotix   Create Space 2 small strips over patellar tendon    Facilitate Muscle  one I strip med and laterally from VLO/VMO around patella to opp side                     PT Education - 12/08/21 0939     Education Details HEP, POC, KT education    Person(s) Educated Patient    Methods Explanation;Demonstration;Handout    Comprehension Verbalized understanding;Returned demonstration  PT Short Term Goals - 12/08/21 0953       PT SHORT TERM GOAL #1   Title Ind with application of tape    Time 2    Period Weeks    Status New    Target Date 12/22/21      PT SHORT TERM GOAL #2   Title Ind with initial HEP    Time 2    Period Weeks    Status New               PT Long  Term Goals - 12/08/21 0954       PT LONG TERM GOAL #1   Title I with advanced HEP for strength and flexiblity    Time 4    Period Weeks    Status New    Target Date 01/05/22      PT LONG TERM GOAL #2   Title report =/> 75% reduction of left knee pain with ADLS including walking inclines and squatting    Time 4    Period Weeks    Status New    Target Date 01/05/22      PT LONG TERM GOAL #3   Title improved L LE strength to 5/5    Time 4    Period Weeks    Status New    Target Date 01/05/22      PT LONG TERM GOAL #4   Title Improved left knee flex to 130 deg to improve function with squats    Time 4    Period Weeks    Status New    Target Date 01/05/22      PT LONG TERM GOAL #5   Title -                    Plan - 12/08/21 0938     Clinical Impression Statement Patient is a 46 yo female with c/o of left knee pain beginning in the fall of 2022 after starting a work out program s/p gastric bypass surgery. She has h/o of left medial meniscus tear and arthroscopy in 2000. She has had 2 injections, the last of which did not help. She now has constant pain made worse with squatting and walking on inclines. She has left hip and knee weakness and decreased knee flexion compared to right although functional. She also has flexibility deficits in her quads/hip flexors and HS. She will benefit from skilled PT to address these deficits.    Stability/Clinical Decision Making Stable/Uncomplicated    Clinical Decision Making Low    Rehab Potential Excellent    PT Frequency 2x / week    PT Duration 4 weeks    PT Treatment/Interventions ADLs/Self Care Home Management;Aquatic Therapy;Cryotherapy;Iontophoresis 4mg /ml Dexamethasone;Moist Heat;Therapeutic activities;Therapeutic exercise;Neuromuscular re-education;Manual techniques;Patient/family education;Taping;Dry needling    PT Next Visit Plan complete FOTO, assess response to KT, review HEP; possible DN to hip ADDuctors left, knee  and hip strengthening    PT Home Exercise Plan [email or web address]    Consulted and Agree with Plan of Care Patient             Patient will benefit from skilled therapeutic intervention in order to improve the following deficits and impairments:  Decreased range of motion, Increased muscle spasms, Pain, Decreased activity tolerance, Impaired flexibility, Decreased strength  Visit Diagnosis: Chronic pain of left knee  Muscle weakness (generalized)  Cramp and spasm     Problem List Patient Active Problem List   Diagnosis Date Noted  At high risk for breast cancer 10/03/2021   Xerosis of skin 07/27/2021   Primary osteoarthritis of left knee 06/30/2021   Hair loss 06/01/2021   Plantar fasciitis, bilateral 05/26/2021   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   Other fatigue 04/21/2021   History of COVID-19 01/18/2021   Attention deficit hyperactivity disorder (ADHD), 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   Vitamin D deficiency 05/21/2019   Severe obesity (HCC) 05/21/2019   Insulin resistance 04/01/2019   BMI 40.0-44.9, adult (HCC) 04/01/2019   Constipation due to slow transit 04/01/2019   History of pulmonary embolism 07/04/2017   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   GAD (generalized anxiety disorder) 04/08/2017   Shrimp allergy 09/24/2016   Allergy to yeast 09/24/2016   Prediabetes 03/17/2015   Dyslipidemia 03/17/2015   MRSA (methicillin resistant Staphylococcus aureus) 01/14/2015   Family history of hypertrophic cardiomyopathy 04/23/2013   Raynaud disease 11/19/2012   Irritable bowel syndrome with diarrhea (colonoscopy 2007) 07/04/2012   Obesity, morbid (more than 100 lbs over ideal weight or BMI > 40) (HCC) 06/30/2012   Anxiety 06/30/2012   PCOS (polycystic ovarian syndrome) 06/30/2012   Solon Palm, PT 12/08/2021, 1:34 PM  [facility] [address] [place], [place], [address] Phone: [phone]   Fax:   [phone]  Name: Ahmiah Bidgood MRN: [id number] Date of Birth: [birth year]

## 2021-12-08 NOTE — Patient Instructions (Addendum)
Access Code: [email or web address] ?URL: [email or web address] ?Date: 12/08/2021 ?Prepared by: Almyra Free ? ?Exercises ?Supine Hamstring Stretch with Strap - 2 x daily - 7 x weekly - 1 sets - 3 reps - 60 sec hold ?Prone Quadriceps Stretch with Strap - 2-3 x daily - 7 x weekly - 1 sets - 3 reps - 60 sec hold ?Seated Piriformis Stretch with Trunk Bend - 2 x daily - 7 x weekly - 1 sets - 3 reps - 30-60 sec hold ?Prone Hamstring Curl with Anchored Resistance - 1 x daily - 3 x weekly - 3 sets - 10 reps ?Seated Hamstring Curl with Anchored Resistance - 1 x daily - 3 x weekly - 3 sets - 10 reps ?Hip and Knee Flexion with Anchored Resistance - 1 x daily - 3 x weekly - 3 sets - 10 reps ?Sitting Knee Extension with Resistance - 1 x daily - 3 x weekly - 3 sets - 10 reps ? ?Patient Education ?Kinesiology tape ?

## 2021-12-09 ENCOUNTER — Ambulatory Visit (INDEPENDENT_AMBULATORY_CARE_PROVIDER_SITE_OTHER): Payer: 59

## 2021-12-09 DIAGNOSIS — M1712 Unilateral primary osteoarthritis, left knee: Secondary | ICD-10-CM | POA: Diagnosis not present

## 2021-12-11 ENCOUNTER — Encounter: Payer: Self-pay | Admitting: Sports Medicine

## 2021-12-11 DIAGNOSIS — S83207S Unspecified tear of unspecified meniscus, current injury, left knee, sequela: Secondary | ICD-10-CM

## 2021-12-15 NOTE — Progress Notes (Signed)
Error

## 2021-12-30 ENCOUNTER — Encounter: Payer: Self-pay | Admitting: Family Medicine

## 2022-01-03 MED ORDER — AMPHETAMINE-DEXTROAMPHET ER 10 MG PO CP24: 20.0000 mg | ORAL_CAPSULE | Freq: Every day | ORAL | 0 refills | Status: DC

## 2022-01-10 ENCOUNTER — Other Ambulatory Visit: Payer: Self-pay | Admitting: Hematology & Oncology

## 2022-01-10 DIAGNOSIS — R0602 Shortness of breath: Secondary | ICD-10-CM

## 2022-01-22 ENCOUNTER — Encounter (HOSPITAL_COMMUNITY): Payer: Self-pay

## 2022-01-22 NOTE — Progress Notes (Signed)
PA for Eliquis 5 mg submitted and approved through 01/22/2023. ?

## 2022-01-23 ENCOUNTER — Encounter: Payer: 59 | Attending: General Surgery | Admitting: Skilled Nursing Facility1

## 2022-01-23 ENCOUNTER — Encounter: Payer: Self-pay | Admitting: Family Medicine

## 2022-01-23 DIAGNOSIS — Z6841 Body Mass Index (BMI) 40.0 and over, adult: Secondary | ICD-10-CM | POA: Diagnosis not present

## 2022-01-23 DIAGNOSIS — Z713 Dietary counseling and surveillance: Secondary | ICD-10-CM | POA: Diagnosis not present

## 2022-01-23 NOTE — Telephone Encounter (Signed)
Form completed and placed in [place] B basket. Please let pt know when done ?
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
## 2022-01-26 ENCOUNTER — Inpatient Hospital Stay: Payer: 59 | Attending: Hematology & Oncology

## 2022-01-26 ENCOUNTER — Encounter: Payer: Self-pay | Admitting: Hematology & Oncology

## 2022-01-26 ENCOUNTER — Inpatient Hospital Stay (HOSPITAL_BASED_OUTPATIENT_CLINIC_OR_DEPARTMENT_OTHER): Payer: 59 | Admitting: Hematology & Oncology

## 2022-01-26 VITALS — BP 128/77 | HR 102 | Temp 98.1°F | Resp 18 | Wt 257.0 lb

## 2022-01-26 DIAGNOSIS — E282 Polycystic ovarian syndrome: Secondary | ICD-10-CM | POA: Diagnosis present

## 2022-01-26 DIAGNOSIS — Z7901 Long term (current) use of anticoagulants: Secondary | ICD-10-CM | POA: Insufficient documentation

## 2022-01-26 DIAGNOSIS — Z79899 Other long term (current) drug therapy: Secondary | ICD-10-CM | POA: Diagnosis not present

## 2022-01-26 DIAGNOSIS — K580 Irritable bowel syndrome with diarrhea: Secondary | ICD-10-CM

## 2022-01-26 DIAGNOSIS — Z86711 Personal history of pulmonary embolism: Secondary | ICD-10-CM | POA: Insufficient documentation

## 2022-01-26 LAB — CBC WITH DIFFERENTIAL (CANCER CENTER ONLY)
Abs Immature Granulocytes: 0.01 10*3/uL (ref 0.00–0.07)
Basophils Absolute: 0 10*3/uL (ref 0.0–0.1)
Basophils Relative: 1 %
Eosinophils Absolute: 0.1 10*3/uL (ref 0.0–0.5)
Eosinophils Relative: 1 %
HCT: 39.6 % (ref 36.0–46.0)
Hemoglobin: 13.1 g/dL (ref 12.0–15.0)
Immature Granulocytes: 0 %
Lymphocytes Relative: 38 %
Lymphs Abs: 2.3 10*3/uL (ref 0.7–4.0)
MCH: 30.8 pg (ref 26.0–34.0)
MCHC: 33.1 g/dL (ref 30.0–36.0)
MCV: 93 fL (ref 80.0–100.0)
Monocytes Absolute: 0.6 10*3/uL (ref 0.1–1.0)
Monocytes Relative: 9 %
Neutro Abs: 3.1 10*3/uL (ref 1.7–7.7)
Neutrophils Relative %: 51 %
Platelet Count: 258 10*3/uL (ref 150–400)
RBC: 4.26 MIL/uL (ref 3.87–5.11)
RDW: 12.9 % (ref 11.5–15.5)
WBC Count: 6.1 10*3/uL (ref 4.0–10.5)
nRBC: 0 % (ref 0.0–0.2)

## 2022-01-26 LAB — CMP (CANCER CENTER ONLY)
ALT: 19 U/L (ref 0–44)
AST: 18 U/L (ref 15–41)
Albumin: 4 g/dL (ref 3.5–5.0)
Alkaline Phosphatase: 72 U/L (ref 38–126)
Anion gap: 4 — ABNORMAL LOW (ref 5–15)
BUN: 23 mg/dL — ABNORMAL HIGH (ref 6–20)
CO2: 30 mmol/L (ref 22–32)
Calcium: 9.4 mg/dL (ref 8.9–10.3)
Chloride: 106 mmol/L (ref 98–111)
Creatinine: 0.69 mg/dL (ref 0.44–1.00)
GFR, Estimated: >60 mL/min (ref >60)
Glucose, Bld: 81 mg/dL (ref 70–99)
Potassium: 4.2 mmol/L (ref 3.5–5.1)
Sodium: 140 mmol/L (ref 135–145)
Total Bilirubin: 0.5 mg/dL (ref 0.3–1.2)
Total Protein: 7 g/dL (ref 6.5–8.1)

## 2022-01-26 NOTE — Progress Notes (Signed)
?Hematology and Oncology Follow Up Visit ? ?Barbara Mcmahon ?[id number] ?November 27, 1975 46 y.o. ?01/26/2022 ? ? 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 ?Current Therapy:   ?Eliquis 5 mg p.o. twice daily-complete 1 year of therapy in September 2019 ?Eilquis 2.5 mg po BID - start 06/08/2018 ?    ?Interim History:  Barbara Mcmahon is back for follow-up.  She looks fantastic.  She is still losing weight.  She had a gastric sleeve done back in August.  Her weight is down now by about 60 pounds. ? ?She just got back from [place].  She was up there with her family.  That a wonderful time up there. ? ?She is about to go out on business.  She says to be traveling 4 out of the 5 next weeks.  I am so happy for that she is so energetic that she can travel as well as she is doing. ? ?As far as her sister is concerned, she got through treatment for breast cancer and seems to be doing well with that. ? ?There is been no problems with the Eliquis.  She has had no problems with bleeding. ? ?Her birthday is coming up on Monday.  I am sure that she will have a wonderful birthday. ? ?She has had no change in bowel or bladder habits.  There is been no rashes.  She thinks she may have eczema.  She had a punch biopsy done on the lesion in the left arm. ? ?Currently, I would have to say that her performance status is probably ECOG 0.      ? ?Medications:  ?Current Outpatient Medications:  ?  acetaminophen (TYLENOL) 650 MG CR tablet, Take 1,300 mg by mouth as needed for pain., Disp: , Rfl:  ?  albuterol (VENTOLIN HFA) 108 (90 Base) MCG/ACT inhaler, Inhale 2 puffs into the lungs every 6 (six) hours as needed for wheezing., Disp: 2 each, Rfl: 11 ?  amphetamine-dextroamphetamine (ADDERALL XR) 10 MG 24 hr capsule, Take 2 capsules (20 mg total) by mouth daily., Disp: 60 capsule, Rfl: 0 ?  amphetamine-dextroamphetamine (ADDERALL XR) 20 MG 24 hr capsule, Take 1 capsule (20 mg total) by mouth  every morning., Disp: 30 capsule, Rfl: 0 ?  DULoxetine (CYMBALTA) 60 MG capsule, Take 2 capsules (120 mg total) by mouth daily., Disp: 180 capsule, Rfl: 1 ?  ELIQUIS 5 MG TABS tablet, TAKE 1 TABLET(5 MG) BY MOUTH TWICE DAILY, Disp: 60 tablet, Rfl: 3 ?  EPINEPHrine 0.3 mg/0.3 mL IJ SOAJ injection, Inject 0.3 mg into the muscle once as needed (anaphylaxis)., Disp: 1 each, Rfl: 1 ?  levonorgestrel (MIRENA) 20 MCG/DAY IUD, 1 each by Intrauterine route once., Disp: , Rfl:  ?  Melatonin 10 MG CAPS, Take 10 mg by mouth at bedtime., Disp: , Rfl:  ?  Multiple Vitamins-Minerals (BARIATRIC MULTIVITAMINS/IRON PO), Take by mouth daily., Disp: , Rfl:  ?  triamcinolone 0.5%-Eucerin equivalent 1:1 cream mixture, Apply topically 2 (two) times daily. Use a one-to-one mixture of triamcinolone 0.5% cream and Eucerin., Disp: 454 g, Rfl: 11 ?  amphetamine-dextroamphetamine (ADDERALL) 10 MG tablet, Take 1 tablet (10 mg total) by mouth daily as needed (ADHD). (Patient not taking: Reported on 01/26/2022), Disp: , Rfl:  ?  Multiple Vitamin (MULTI-VITAMIN) tablet, Take 1 tablet by mouth daily. (Patient not taking: Reported on 01/26/2022), Disp: , Rfl:  ? ?Allergies:  ?Allergies  ?Allergen Reactions  ? Cinnamon Shortness Of Breath and Rash  ?  Wheezing ?  ? Guaifenesin-Codeine Itching  ? Morphine Itching, Rash and Hives  ?  Other reaction(s): Other (See Comments) ?BEHAVIOR CHANGES ?Big mood changes ?BEHAVIOR CHANGES ?irritable  ? Nutmeg Oil (Myristica Oil) Shortness Of Breath and Rash  ?  wheezing  ? Shellfish Allergy Hives, Swelling and Anaphylaxis  ?  Throat closes  ? Shrimp Extract Allergy Skin Test   ?  Other reaction(s): Wheezing  ? Codeine Hives and Itching  ?  Pt says she can take percocet  ? Clonazepam Itching  ? Menthol Hives  ? Robitussin A-C [Guaifenesin-Codeine] Itching  ? Tramadol Itching  ? Yeast Hives  ? ? ?Past Medical History, Surgical history, Social history, and Family History were reviewed and updated. ? ?Review of  Systems: ?Review of Systems  ?Constitutional: Negative.   ?HENT:  Negative.    ?Eyes: Negative.   ?Respiratory: Negative.    ?Cardiovascular: Negative.   ?Gastrointestinal: Negative.   ?Endocrine: Negative.   ?Genitourinary: Negative.    ?Musculoskeletal: Negative.   ?Skin: Negative.   ?Neurological: Negative.   ?Hematological: Negative.   ?Psychiatric/Behavioral: Negative.    ? ?Physical Exam: ? weight is 257 lb (116.6 kg). Her oral temperature is 98.1 ?F (36.7 ?C). Her blood pressure is 128/77 and her pulse is 102 (abnormal). Her respiration is 18 and oxygen saturation is 100%.  ? ?Wt Readings from Last 3 Encounters:  ?01/26/22 257 lb (116.6 kg)  ?01/23/22 251 lb 4.8 oz (114 kg)  ?11/30/21 258 lb (117 kg)  ? ? ?Physical Exam ?Vitals reviewed.  ?HENT:  ?   Head: Normocephalic and atraumatic.  ?Eyes:  ?   Pupils: Pupils are equal, round, and reactive to light.  ?Cardiovascular:  ?   Rate and Rhythm: Normal rate and regular rhythm.  ?   Heart sounds: Normal heart sounds.  ?Pulmonary:  ?   Effort: Pulmonary effort is normal.  ?   Breath sounds: Normal breath sounds.  ?Abdominal:  ?   General: Bowel sounds are normal.  ?   Palpations: Abdomen is soft.  ?Musculoskeletal:     ?   General: No tenderness or deformity. Normal range of motion.  ?   Cervical back: Normal range of motion.  ?Lymphadenopathy:  ?   Cervical: No cervical adenopathy.  ?Skin: ?   General: Skin is warm and dry.  ?   Findings: No erythema or rash.  ?Neurological:  ?   Mental Status: She is alert and oriented to person, place, and time.  ?Psychiatric:     ?   Behavior: Behavior normal.     ?   Thought Content: Thought content normal.     ?   Judgment: Judgment normal.  ? ? ? ?Lab Results  ?Component Value Date  ? WBC 6.1 01/26/2022  ? HGB 13.1 01/26/2022  ? HCT 39.6 01/26/2022  ? MCV 93.0 01/26/2022  ? PLT 258 01/26/2022  ? ?  Chemistry   ?   ?Component Value Date/Time  ? NA 140 01/26/2022 0833  ? NA 141 04/20/2020 1441  ? NA 140 10/11/2017 1122  ? K  4.2 01/26/2022 0833  ? K 4.2 10/11/2017 1122  ? CL 106 01/26/2022 0833  ? CL 103 10/11/2017 1122  ? CO2 30 01/26/2022 0833  ? CO2 29 10/11/2017 1122  ? BUN 23 (H) 01/26/2022 0814  ? BUN 14 04/20/2020 1441  ? BUN 10 10/11/2017 1122  ? CREATININE 0.69 01/26/2022 0833  ? CREATININE 0.71 12/22/2020 0930  ?    ?  Component Value Date/Time  ? CALCIUM 9.4 01/26/2022 0833  ? CALCIUM 8.7 10/11/2017 1122  ? ALKPHOS 72 01/26/2022 0833  ? ALKPHOS 83 10/11/2017 1122  ? AST 18 01/26/2022 0833  ? ALT 19 01/26/2022 0833  ? ALT 36 10/11/2017 1122  ? BILITOT 0.5 01/26/2022 4562  ?  ? ? ?Impression and Plan: ?Ms. Callies is a 46 year-old white female.  She has polycystic ovaries.  She had a pulmonary embolism in September 2018.  This was idiopathic.  We  have her on Eliquis.  She likes to be on low-dose Eliquis.  She does travel for work.  She just feels more comfortable traveling. ? ?I am so happy that the gastric sleeve went well.  I am also happy that she is losing the weight and she will continue to lose weight because she is incredibly motivated. ? ?We will go ahead and see her back in another 6 months.  I do not see that there should be any problems between now and then. ? ? ?Volanda Napoleon, MD ?4/21/20239:34 AM ?

## 2022-02-04 ENCOUNTER — Encounter: Payer: Self-pay | Admitting: Family Medicine

## 2022-02-05 MED ORDER — AMPHETAMINE-DEXTROAMPHET ER 10 MG PO CP24: 20.0000 mg | ORAL_CAPSULE | Freq: Every day | ORAL | 0 refills | Status: DC

## 2022-02-21 ENCOUNTER — Encounter: Payer: Self-pay | Admitting: Hematology & Oncology

## 2022-02-21 ENCOUNTER — Other Ambulatory Visit: Payer: Self-pay | Admitting: Hematology & Oncology

## 2022-02-21 DIAGNOSIS — R0602 Shortness of breath: Secondary | ICD-10-CM

## 2022-03-12 ENCOUNTER — Telehealth: Payer: Self-pay

## 2022-03-12 ENCOUNTER — Other Ambulatory Visit: Payer: Self-pay | Admitting: Family Medicine

## 2022-03-12 DIAGNOSIS — J0140 Acute pansinusitis, unspecified: Secondary | ICD-10-CM

## 2022-03-12 MED ORDER — FLUCONAZOLE 150 MG PO TABS: 150.0000 mg | ORAL_TABLET | Freq: Once | ORAL | 0 refills | Status: AC

## 2022-03-12 NOTE — Telephone Encounter (Signed)
Meds ordered this encounter  Medications   fluconazole (DIFLUCAN) 150 MG tablet    Sig: Take 1 tablet (150 mg total) by mouth once for 1 dose.    Dispense:  1 tablet    Refill:  0   IF not better after tx will need appt

## 2022-03-12 NOTE — Telephone Encounter (Signed)
LVM informing pt of RX.  T. Kimra Kantor, CMA 

## 2022-03-12 NOTE — Telephone Encounter (Signed)
Pt called requesting RX for Diflucan.  Pt states that she had orthoscopic knee surgery last week.  She states that she has developed a tan vaginal discharge, with itching and a constant burning.  Please advise.

## 2022-03-13 ENCOUNTER — Ambulatory Visit: Payer: 59 | Attending: Orthopaedic Surgery | Admitting: Physical Therapy

## 2022-03-13 DIAGNOSIS — M6281 Muscle weakness (generalized): Secondary | ICD-10-CM | POA: Diagnosis present

## 2022-03-13 DIAGNOSIS — R2689 Other abnormalities of gait and mobility: Secondary | ICD-10-CM | POA: Diagnosis present

## 2022-03-13 DIAGNOSIS — M25562 Pain in left knee: Secondary | ICD-10-CM | POA: Insufficient documentation

## 2022-03-13 DIAGNOSIS — R252 Cramp and spasm: Secondary | ICD-10-CM | POA: Diagnosis present

## 2022-03-13 DIAGNOSIS — G8929 Other chronic pain: Secondary | ICD-10-CM | POA: Insufficient documentation

## 2022-03-13 NOTE — Patient Instructions (Signed)
Access Code: [email or web address] URL: [email or web address] Date: 03/13/2022 Prepared by: Estill Bamberg April Thurnell Garbe  Exercises - Standing Hip Adduction with Anchored Resistance  - 1 x daily - 7 x weekly - 3 sets - 10 reps - Hip Abduction with Resistance Loop  - 1 x daily - 7 x weekly - 3 sets - 10 reps - Supine Active Straight Leg Raise  - 1 x daily - 7 x weekly - 3 sets - 10 reps - Prone Hamstring Curl with Anchored Resistance  - 1 x daily - 7 x weekly - 3 sets - 10 reps

## 2022-03-13 NOTE — Therapy (Signed)
[facility] [place], [place], [address] Phone: [phone]   Fax:  [phone]  Physical Therapy Evaluation  Patient Details  Name: Barbara Mcmahon MRN: [id number] Date of Birth: 1976/03/30 Referring Provider (PT): Hiram Gash, MD   Encounter Date: 03/13/2022   PT End of Session - 03/13/22 1149     Visit Number 1    Number of Visits 6    Date for PT Re-Evaluation 04/24/22    Authorization Type UHC    PT Start Time 53    PT Stop Time 5053    PT Time Calculation (min) 43 min    Activity Tolerance Patient tolerated treatment well    Behavior During Therapy WFL for tasks assessed/performed             Past Medical History:  Diagnosis Date   Abnormal Pap smear and cervical HPV (human papillomavirus)    Anxiety    Arthritis    BAck and sacral joint   Back pain    Colon polyps    Constipation    Gallbladder problem    Generalized anxiety disorder    GERD (gastroesophageal reflux disease)    IBS (irritable bowel syndrome)    Infertility, female    Joint pain    Multiple food allergies    Shellfish, cinnamon and yeast   Obesity    PCOS (polycystic ovarian syndrome)    Pneumonia    PONV (postoperative nausea and vomiting)    Prediabetes    Pulmonary embolism ([place])    2019   Seizure disorder ([place])    Vitamin D deficiency     Past Surgical History:  Procedure Laterality Date   BREAST BIOPSY Right 2019   benign   CHOLECYSTECTOMY     COLONOSCOPY  11/30/2021   colonoscopy every 5 yrs since hx of polyps around age 61   [place]  right   UPPER GASTROINTESTINAL ENDOSCOPY     UPPER GI ENDOSCOPY N/A 05/22/2021   Procedure: UPPER GI ENDOSCOPY;  Surgeon: [name], MD;  Location: [facility];  Service: General;  Laterality: N/A;    There were no vitals filed for this visit.    Subjective Assessment -  03/13/22 1106     Subjective Pt reports she got her knee scoped last Wednesday. Pt notes it is feeling fine -- has not needed to take pain medication. Sleeping has been difficult. Has been icing and elevating. Pt reports she is walking and going up/down steps without any issues. Pt is anxious about returning to working out. Has signed up for aqua zumba but is waiting for her incisions to heal.    Pertinent History PE 2019 (can't take NSAIDS), seizures (when she was a baby), h/o of left medial meniscus tear and scope    How long can you sit comfortably? n/a    How long can you stand comfortably? n/a    How long can you walk comfortably? n/a    Diagnostic tests --    Patient Stated Goals Return to normal exercise activity    Currently in Pain? No/denies                [facility] PT Assessment - 03/13/22 0001       Assessment   Medical Diagnosis Z98.890 (ICD-10-CM) - S/P left knee arthroscopy    Referring Provider (PT) Ophelia Charter  T, MD    Hand Dominance Right      Precautions   Precautions None      Restrictions   Weight Bearing Restrictions No      Balance Screen   Has the patient fallen in the past 6 months No      [place] residence      Prior Function   Level of Independence Independent    Vocation Full time employment    Leisure Gym/work out      Observation/Other Assessments   Focus on Therapeutic Outcomes (FOTO)  59; predicted 73      AROM   Right/Left Knee Left    Left Knee Extension -10    Left Knee Flexion 120      Strength   Right Hip Flexion 5/5    Right Hip Extension 5/5    Right Hip External Rotation  4+/5    Right Hip Internal Rotation 4/5    Right Hip ABduction 3+/5    Right Hip ADduction 5/5    Left Hip Flexion 5/5    Left Hip Extension 5/5    Left Hip External Rotation 4/5    Left Hip Internal Rotation 4-/5    Left Hip ABduction 3+/5    Left Hip ADduction 3+/5    Right Knee Flexion 5/5    Right Knee  Extension 5/5    Left Knee Flexion 4+/5    Left Knee Extension 4/5      Flexibility   Hamstrings L ~80 deg; R ~90 deg                        Objective measurements completed on examination: See above findings.                  PT Short Term Goals - 12/08/21 0953       PT SHORT TERM GOAL #1   Title Ind with application of tape    Time 2    Period Weeks    Status New    Target Date 12/22/21      PT SHORT TERM GOAL #2   Title Ind with initial HEP    Time 2    Period Weeks    Status New               PT Long Term Goals - 03/13/22 1154       PT LONG TERM GOAL #1   Title I with advanced HEP for strength and flexiblity    Time 6    Period Weeks    Status New    Target Date 04/24/22      PT LONG TERM GOAL #2   Title Return to gym activities with no pain    Time 6    Period Weeks    Status New    Target Date 04/24/22      PT LONG TERM GOAL #3   Title improved L LE strength to 5/5    Time 6    Period Weeks    Status New    Target Date 04/24/22      PT LONG TERM GOAL #4   Title L knee ROM = to R knee    Time 6    Period Weeks    Status New    Target Date 04/24/22      PT LONG TERM GOAL #5   Title Pt will increase FOTO to >/=  73    Baseline 59    Time 6    Period Weeks    Status New    Target Date 04/24/22                    Plan - 03/13/22 1149     Clinical Impression Statement [name] returns to [facility] s/p L knee arthroscopy for medial meniscus tear. No current pain noted. Pt has returned to walking but not yet returned to full gym activities. Discussed return to gentle yoga and pilates. Assessment significant for L adductors, quad and hamstring weakness compared to R and bilat hip abductor weakness. Pt does demo some limitations in L knee extension likely due to mild edema. Pt would benefit from PT to address these deficits for full return to prior activities.    Personal Factors and Comorbidities  Age;Fitness    Examination-Activity Limitations Bend;Carry;Lift;Squat    Examination-Participation Restrictions Community Activity    Stability/Clinical Decision Making Stable/Uncomplicated    Clinical Decision Making Low    Rehab Potential Excellent    PT Frequency 1x / week    PT Duration 6 weeks    PT Treatment/Interventions ADLs/Self Care Home Management;Aquatic Therapy;Cryotherapy;Iontophoresis '4mg'$ /ml Dexamethasone;Moist Heat;Therapeutic activities;Therapeutic exercise;Neuromuscular re-education;Manual techniques;Patient/family education;Taping;Dry needling;Electrical Stimulation;Balance training;Gait training;Stair training;Functional mobility training;Passive range of motion    PT Next Visit Plan Review and progress HEP. Knee ROM/stretching. Continue knee and hip strengthening. Consider machine strengthening.    PT Home Exercise Plan [email or web address]    Consulted and Agree with Plan of Care Patient             Patient will benefit from skilled therapeutic intervention in order to improve the following deficits and impairments:  Decreased range of motion, Increased muscle spasms, Pain, Decreased activity tolerance, Impaired flexibility, Decreased strength  Visit Diagnosis: Chronic pain of left knee  Muscle weakness (generalized)  Cramp and spasm  Other abnormalities of gait and mobility     Problem List Patient Active Problem List   Diagnosis Date Noted   At high risk for breast cancer 10/03/2021   Xerosis of skin 07/27/2021   Meniscal tear of left knee 06/30/2021   Hair loss 06/01/2021   Plantar fasciitis, bilateral 05/26/2021   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   Other fatigue 04/21/2021   History of COVID-19 01/18/2021   Attention deficit hyperactivity disorder (ADHD), 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   Vitamin D deficiency 05/21/2019   Severe obesity ([place]) 05/21/2019   Insulin resistance 04/01/2019   BMI 40.0-44.9, adult ([place]) 04/01/2019   Constipation due to  slow transit 04/01/2019   History of pulmonary embolism 07/04/2017   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   GAD (generalized anxiety disorder) 04/08/2017   Shrimp allergy 09/24/2016   Allergy to yeast 09/24/2016   Prediabetes 03/17/2015   Dyslipidemia 03/17/2015   MRSA (methicillin resistant Staphylococcus aureus) 01/14/2015   Family history of hypertrophic cardiomyopathy 04/23/2013   Raynaud disease 11/19/2012   Irritable bowel syndrome with diarrhea (colonoscopy 2007) 07/04/2012   Obesity, morbid (more than 100 lbs over ideal weight or BMI > 40) ([place]) 06/30/2012   Anxiety 06/30/2012   PCOS (polycystic ovarian syndrome) 06/30/2012    Brei Pociask April Gordy Levan, PT, DPT 03/13/2022, 11:58 AM  [facility] [address] [place], [place], [address] Phone: [phone]   Fax:  [phone]  Name: Barbara Mcmahon MRN: [id number] Date of Birth: 01/06/1976

## 2022-03-20 ENCOUNTER — Ambulatory Visit: Payer: 59 | Admitting: Physical Therapy

## 2022-03-20 DIAGNOSIS — M25562 Pain in left knee: Secondary | ICD-10-CM | POA: Diagnosis not present

## 2022-03-20 DIAGNOSIS — R252 Cramp and spasm: Secondary | ICD-10-CM

## 2022-03-20 DIAGNOSIS — G8929 Other chronic pain: Secondary | ICD-10-CM

## 2022-03-20 DIAGNOSIS — M6281 Muscle weakness (generalized): Secondary | ICD-10-CM

## 2022-03-20 DIAGNOSIS — R2689 Other abnormalities of gait and mobility: Secondary | ICD-10-CM

## 2022-03-20 NOTE — Therapy (Signed)
[facility] [place], [place], [address] Phone: [phone]   Fax:  [phone]  Physical Therapy Treatment  Patient Details  Name: Barbara Mcmahon MRN: [id number] Date of Birth: 1976/03/03 Referring Provider (PT): Hiram Gash, MD   Encounter Date: 03/20/2022   PT End of Session - 03/20/22 1154     Visit Number 2    Number of Visits 6    Date for PT Re-Evaluation 04/24/22    Authorization Type U[employer]    PT Start Time 1150    PT Stop Time 1230    PT Time Calculation (min) 40 min    Activity Tolerance Patient tolerated treatment well    Behavior During Therapy WFL for tasks assessed/performed             Past Medical History:  Diagnosis Date   Abnormal Pap smear and cervical HPV (human papillomavirus)    Anxiety    Arthritis    BAck and sacral joint   Back pain    Colon polyps    Constipation    Gallbladder problem    Generalized anxiety disorder    GERD (gastroesophageal reflux disease)    IBS (irritable bowel syndrome)    Infertility, female    Joint pain    Multiple food allergies    Shellfish, cinnamon and yeast   Obesity    PCOS (polycystic ovarian syndrome)    Pneumonia    PONV (postoperative nausea and vomiting)    Prediabetes    Pulmonary embolism ([place])    2019   Seizure disorder ([place])    Vitamin D deficiency     Past Surgical History:  Procedure Laterality Date   BREAST BIOPSY Right 2019   benign   CHOLECYSTECTOMY     COLONOSCOPY  11/30/2021   colonoscopy every 5 yrs since hx of polyps around age 87   [place]  right   UPPER GASTROINTESTINAL ENDOSCOPY     UPPER GI ENDOSCOPY N/A 05/22/2021   Procedure: UPPER GI ENDOSCOPY;  Surgeon: [name], MD;  Location: [facility];  Service: General;  Laterality: N/A;    There were no vitals filed for this visit.   Subjective Assessment -  03/20/22 1234     Subjective Pt notes "popping" sensation in her knee with sit<>stand especially from car and when she extends her knee from a bent position. Also reports some pain in the back of her knee where she has a cyst -- states it may be possibly getting bigger. Has been performing her exercises.    Pertinent History PE 2019 (can't take NSAIDS), seizures (when she was a baby), h/o of left medial meniscus tear and scope    How long can you sit comfortably? n/a    How long can you stand comfortably? n/a    How long can you walk comfortably? n/a    Patient Stated Goals Return to normal exercise activity    Currently in Pain? No/denies                [facility] PT Assessment - 03/20/22 0001       Assessment   Medical Diagnosis Z98.890 (ICD-10-CM) - S/P left knee arthroscopy    Referring Provider (PT) Hiram Gash, MD    Onset Date/Surgical Date 06/22/22    Hand Dominance Right  [facility] Adult PT Treatment/Exercise - 03/20/22 0001       Exercises   Exercises Knee/Hip      Knee/Hip Exercises: Stretches   Gastroc Stretch Right;30 seconds;2 reps    Soleus Stretch Right;30 seconds;2 reps      Knee/Hip Exercises: Aerobic   Recumbent Bike L4 x 5 min      Knee/Hip Exercises: Standing   Heel Raises Left;10 reps;2 sets;Right    Heel Raises Limitations knee straight and then knee bent   trialed single leg on L; however, pt fatigued quickly   Hip ADduction Strengthening;Right;Left;2 sets;10 reps    Hip ADduction Limitations with wash rag    Hip Abduction Stengthening;3 sets;10 reps;Knee straight    Abduction Limitations green tband    Lateral Step Up Right;2 sets;10 reps;Hand Hold: 0;Step Height: 4"    Forward Step Up Right;5 reps    Step Down Right;3 sets;10 reps;Step Height: 4"      Knee/Hip Exercises: Seated   Sit to Sand 10 reps                       PT Short Term Goals - 12/08/21 0953       PT SHORT TERM GOAL #1    Title Ind with application of tape    Time 2    Period Weeks    Status New    Target Date 12/22/21      PT SHORT TERM GOAL #2   Title Ind with initial HEP    Time 2    Period Weeks    Status New               PT Long Term Goals - 03/13/22 1154       PT LONG TERM GOAL #1   Title I with advanced HEP for strength and flexiblity    Time 6    Period Weeks    Status New    Target Date 04/24/22      PT LONG TERM GOAL #2   Title Return to gym activities with no pain    Time 6    Period Weeks    Status New    Target Date 04/24/22      PT LONG TERM GOAL #3   Title improved L LE strength to 5/5    Time 6    Period Weeks    Status New    Target Date 04/24/22      PT LONG TERM GOAL #4   Title L knee ROM = to R knee    Time 6    Period Weeks    Status New    Target Date 04/24/22      PT LONG TERM GOAL #5   Title Pt will increase FOTO to >/=73    Baseline 59    Time 6    Period Weeks    Status New    Target Date 04/24/22                   Plan - 03/20/22 1235     Clinical Impression Statement Treatment focused on progressing exercises as tolerated. After warming up on bike, pt did not note the knee popping sensation. Atrophy noted L>R gastroc/soleus. Initiated calf strengthening. Worked primarily on stairs for strengthening with good pt tolerance.    Personal Factors and Comorbidities Age;Fitness    Examination-Activity Limitations Bend;Carry;Lift;Squat    Examination-Participation Restrictions Community Activity    Stability/Clinical Decision Making Stable/Uncomplicated  Rehab Potential Excellent    PT Frequency 1x / week    PT Duration 6 weeks    PT Treatment/Interventions ADLs/Self Care Home Management;Aquatic Therapy;Cryotherapy;Iontophoresis '4mg'$ /ml Dexamethasone;Moist Heat;Therapeutic activities;Therapeutic exercise;Neuromuscular re-education;Manual techniques;Patient/family education;Taping;Dry needling;Electrical Stimulation;Balance  training;Gait training;Stair training;Functional mobility training;Passive range of motion    PT Next Visit Plan Review and progress HEP. Knee ROM/stretching. Continue knee and hip strengthening (pt would like to learn exercises she can do with her pball at home). Print out [employer]. Consider machine strengthening.    PT Home Exercise Plan [email or web address]    Consulted and Agree with Plan of Care Patient             Patient will benefit from skilled therapeutic intervention in order to improve the following deficits and impairments:  Decreased range of motion, Increased muscle spasms, Pain, Decreased activity tolerance, Impaired flexibility, Decreased strength  Visit Diagnosis: Chronic pain of left knee  Muscle weakness (generalized)  Cramp and spasm  Other abnormalities of gait and mobility     Problem List Patient Active Problem List   Diagnosis Date Noted   At high risk for breast cancer 10/03/2021   Xerosis of skin 07/27/2021   Meniscal tear of left knee 06/30/2021   Hair loss 06/01/2021   Plantar fasciitis, bilateral 05/26/2021   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   Other fatigue 04/21/2021   History of COVID-19 01/18/2021   Attention deficit hyperactivity disorder (ADHD), 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   Vitamin D deficiency 05/21/2019   Severe obesity ([place]) 05/21/2019   Insulin resistance 04/01/2019   BMI 40.0-44.9, adult ([place]) 04/01/2019   Constipation due to slow transit 04/01/2019   History of pulmonary embolism 07/04/2017   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   GAD (generalized anxiety disorder) 04/08/2017   Shrimp allergy 09/24/2016   Allergy to yeast 09/24/2016   Prediabetes 03/17/2015   Dyslipidemia 03/17/2015   MRSA (methicillin resistant Staphylococcus aureus) 01/14/2015   Family history of hypertrophic cardiomyopathy 04/23/2013   Raynaud disease 11/19/2012   Irritable bowel syndrome with diarrhea (colonoscopy 2007) 07/04/2012    Obesity, morbid (more than 100 lbs over ideal weight or BMI > 40) ([place]) 06/30/2012   Anxiety 06/30/2012   PCOS (polycystic ovarian syndrome) 06/30/2012    Michaline Kindig April Beatrix Shipper [place], PT 03/20/2022, 12:40 PM  [facility] [address] [place], [place], [address] Phone: [phone]   Fax:  [phone]  Name: Barbara Mcmahon MRN: [id number] Date of Birth: Sep 09, 1976

## 2022-03-28 ENCOUNTER — Encounter: Payer: Self-pay | Admitting: Physical Therapy

## 2022-04-03 ENCOUNTER — Ambulatory Visit: Payer: 59 | Admitting: Physical Therapy

## 2022-04-03 DIAGNOSIS — M25562 Pain in left knee: Secondary | ICD-10-CM | POA: Diagnosis not present

## 2022-04-03 DIAGNOSIS — G8929 Other chronic pain: Secondary | ICD-10-CM

## 2022-04-03 DIAGNOSIS — M6281 Muscle weakness (generalized): Secondary | ICD-10-CM

## 2022-04-03 NOTE — Therapy (Signed)
[facility] [address] [place], [place], [address] Phone: [phone]   Fax:  [phone]  Physical Therapy Treatment  Patient Details  Name: Barbara Mcmahon MRN: [id number] Date of Birth: 1976-01-03 Referring Provider (PT): Bjorn Pippin, MD   Encounter Date: 04/03/2022 Rationale for Evaluation and Treatment Rehabilitation   PT End of Session - 04/03/22 0843     Visit Number 3    Number of Visits 6    Date for PT Re-Evaluation 04/24/22    PT Start Time 0800    PT Stop Time 0841    PT Time Calculation (min) 41 min    Activity Tolerance Patient tolerated treatment well    Behavior During Therapy [facility] for tasks assessed/performed             Past Medical History:  Diagnosis Date   Abnormal Pap smear and cervical HPV (human papillomavirus)    Anxiety    Arthritis    BAck and sacral joint   Back pain    Colon polyps    Constipation    Gallbladder problem    Generalized anxiety disorder    GERD (gastroesophageal reflux disease)    IBS (irritable bowel syndrome)    Infertility, female    Joint pain    Multiple food allergies    Shellfish, cinnamon and yeast   Obesity    PCOS (polycystic ovarian syndrome)    Pneumonia    PONV (postoperative nausea and vomiting)    Prediabetes    Pulmonary embolism ([facility])    2019   Seizure disorder ([facility])    Vitamin D deficiency     Past Surgical History:  Procedure Laterality Date   BREAST BIOPSY Right 2019   benign   CHOLECYSTECTOMY     COLONOSCOPY  11/30/2021   colonoscopy every 5 yrs since hx of polyps around age 35   COLPOSCOPY     DILATION AND CURETTAGE OF UTERUS     GYNECOLOGIC CRYOSURGERY     KNEE SURGERY  right   UPPER GASTROINTESTINAL ENDOSCOPY     UPPER GI ENDOSCOPY N/A 05/22/2021   Procedure: UPPER GI ENDOSCOPY;  Surgeon: Gaynelle Adu, MD;  Location: [facility];  Service: General;  Laterality: N/A;    There were no vitals filed for this visit.    Subjective Assessment - 04/03/22 0805     Subjective Pt states her knee has felt more "stiff" lately. she has been on her feet a lot over the weekend. She sees the MD on Friday    Patient Stated Goals Return to normal exercise activity    Currently in Pain? No/denies                [facility] PT Assessment - 04/03/22 0001       AROM   Left Knee Extension -2    Left Knee Flexion 132      Palpation   Patella mobility WFL                           [facility] Adult PT Treatment/Exercise - 04/03/22 0001       Knee/Hip Exercises: Stretches   Gastroc Stretch Right;30 seconds;2 reps    Soleus Stretch Right;30 seconds;2 reps      Knee/Hip Exercises: Aerobic   Recumbent Bike L4 x 5 min      Knee/Hip Exercises: Standing   Heel Raises Left;10 reps;2 sets;Right    Heel Raises Limitations knee straight  and knee bent    Forward Step Up Left;15 reps;Hand Hold: 0;Step Height: 8"    Forward Step Up Limitations then straddle step ups 8'' no UE support x 15    Step Down 2 sets;10 reps    Step Down Limitations forward and laterally    Other Standing Knee Exercises hip 3 way wash cloth    Other Standing Knee Exercises sidestep red TB and monster walk red TB, modified SL deadlift 10# KB x 15      Knee/Hip Exercises: Seated   Sit to Sand 15 reps   10#KB                      PT Short Term Goals - 12/08/21 0953       PT SHORT TERM GOAL #1   Title Ind with application of tape    Time 2    Period Weeks    Status New    Target Date 12/22/21      PT SHORT TERM GOAL #2   Title Ind with initial HEP    Time 2    Period Weeks    Status New               PT Long Term Goals - 03/13/22 1154       PT LONG TERM GOAL #1   Title I with advanced HEP for strength and flexiblity    Time 6    Period Weeks    Status New    Target Date 04/24/22      PT LONG TERM GOAL #2   Title Return to gym activities with no pain    Time 6    Period Weeks    Status New     Target Date 04/24/22      PT LONG TERM GOAL #3   Title improved L LE strength to 5/5    Time 6    Period Weeks    Status New    Target Date 04/24/22      PT LONG TERM GOAL #4   Title L knee ROM = to R knee    Time 6    Period Weeks    Status New    Target Date 04/24/22      PT LONG TERM GOAL #5   Title Pt will increase FOTO to >/=73    Baseline 59    Time 6    Period Weeks    Status New    Target Date 04/24/22                   Plan - 04/03/22 0843     Clinical Impression Statement Pt continued to focus on progressing knee strength and coordination. Pt with good tolerance to progression of exercises. Some muscle imbalance noted with forward step downs on Lt LE    PT Next Visit Plan knee and hip strengthening, Pball exercises, progress HEP    PT Home Exercise Plan [email or web address]    Consulted and Agree with Plan of Care Patient             Patient will benefit from skilled therapeutic intervention in order to improve the following deficits and impairments:     Visit Diagnosis: Chronic pain of left knee  Muscle weakness (generalized)     Problem List Patient Active Problem List   Diagnosis Date Noted   At high risk for breast cancer 10/03/2021   Xerosis of skin 07/27/2021  Meniscal tear of left knee 06/30/2021   Hair loss 06/01/2021   Plantar fasciitis, bilateral 05/26/2021   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   Other fatigue 04/21/2021   History of COVID-19 01/18/2021   Attention deficit hyperactivity disorder (ADHD), 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   Vitamin D deficiency 05/21/2019   Severe obesity ([facility]) 05/21/2019   Insulin resistance 04/01/2019   BMI 40.0-44.9, adult ([facility]) 04/01/2019   Constipation due to slow transit 04/01/2019   History of pulmonary embolism 07/04/2017   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   GAD (generalized anxiety disorder) 04/08/2017   Shrimp allergy 09/24/2016   Allergy to yeast 09/24/2016   Prediabetes  03/17/2015   Dyslipidemia 03/17/2015   MRSA (methicillin resistant Staphylococcus aureus) 01/14/2015   Family history of hypertrophic cardiomyopathy 04/23/2013   Raynaud disease 11/19/2012   Irritable bowel syndrome with diarrhea (colonoscopy 2007) 07/04/2012   Obesity, morbid (more than 100 lbs over ideal weight or BMI > 40) ([facility]) 06/30/2012   Anxiety 06/30/2012   PCOS (polycystic ovarian syndrome) 06/30/2012    Lukasz Rogus, PT 04/03/2022, 8:45 AM  [facility] [address] [place], [place], [address] Phone: [phone]   Fax:  [phone]  Name: Barbara Mcmahon MRN: [id number] Date of Birth: [birth year]

## 2022-04-11 ENCOUNTER — Ambulatory Visit: Payer: 59 | Admitting: Physical Therapy

## 2022-04-12 ENCOUNTER — Ambulatory Visit (HOSPITAL_BASED_OUTPATIENT_CLINIC_OR_DEPARTMENT_OTHER): Payer: Self-pay | Admitting: Physical Therapy

## 2022-04-18 ENCOUNTER — Ambulatory Visit: Payer: 59 | Attending: Orthopaedic Surgery | Admitting: Physical Therapy

## 2022-04-18 DIAGNOSIS — M6281 Muscle weakness (generalized): Secondary | ICD-10-CM | POA: Diagnosis present

## 2022-04-18 DIAGNOSIS — G8929 Other chronic pain: Secondary | ICD-10-CM | POA: Diagnosis present

## 2022-04-18 DIAGNOSIS — R2689 Other abnormalities of gait and mobility: Secondary | ICD-10-CM | POA: Insufficient documentation

## 2022-04-18 DIAGNOSIS — M25562 Pain in left knee: Secondary | ICD-10-CM | POA: Insufficient documentation

## 2022-04-18 DIAGNOSIS — R252 Cramp and spasm: Secondary | ICD-10-CM | POA: Diagnosis present

## 2022-04-18 NOTE — Therapy (Signed)
[facility] [place], [place], [address] Phone: [phone]   Fax:  [phone]  Physical Therapy Treatment  Patient Details  Name: Barbara Mcmahon MRN: [id number] Date of Birth: 1975/12/02 Referring Provider (PT): Hiram Gash, MD   Encounter Date: 04/18/2022 Rationale for Evaluation and Treatment Rehabilitation   PT End of Session - 04/18/22 4401     Visit Number 4    Number of Visits 6    Date for PT Re-Evaluation 04/24/22    PT Start Time 0845    PT Stop Time 0923    PT Time Calculation (min) 38 min    Activity Tolerance Patient tolerated treatment well    Behavior During Therapy [facility] for tasks assessed/performed             Past Medical History:  Diagnosis Date   Abnormal Pap smear and cervical HPV (human papillomavirus)    Anxiety    Arthritis    BAck and sacral joint   Back pain    Colon polyps    Constipation    Gallbladder problem    Generalized anxiety disorder    GERD (gastroesophageal reflux disease)    IBS (irritable bowel syndrome)    Infertility, female    Joint pain    Multiple food allergies    Shellfish, cinnamon and yeast   Obesity    PCOS (polycystic ovarian syndrome)    Pneumonia    PONV (postoperative nausea and vomiting)    Prediabetes    Pulmonary embolism ([place])    2019   Seizure disorder ([place])    Vitamin D deficiency     Past Surgical History:  Procedure Laterality Date   BREAST BIOPSY Right 2019   benign   CHOLECYSTECTOMY     COLONOSCOPY  11/30/2021   colonoscopy every 5 yrs since hx of polyps around age 89   [place]  right   UPPER GASTROINTESTINAL ENDOSCOPY     UPPER GI ENDOSCOPY N/A 05/22/2021   Procedure: UPPER GI ENDOSCOPY;  Surgeon: [name], MD;  Location: [facility];  Service: General;  Laterality: N/A;    There were no vitals filed for this visit.    Subjective Assessment - 04/18/22 0847     Subjective Pt states she had food poisoning so was feeling bad for the past few days. Is finally feeling better    Patient Stated Goals Return to normal exercise activity    Currently in Pain? No/denies                [facility] PT Assessment - 04/18/22 0001       Observation/Other Assessments   Focus on Therapeutic Outcomes (FOTO)  74      AROM   Left Knee Extension 0    Left Knee Flexion 132      Strength   Left Hip Flexion 5/5    Left Hip ABduction 5/5    Left Knee Flexion 4+/5    Left Knee Extension 4+/5                           [facility] Adult PT Treatment/Exercise - 04/18/22 0001       Knee/Hip Exercises: Stretches   Gastroc Stretch Right;30 seconds;2 reps    Soleus Stretch Right;30 seconds;2 reps      Knee/Hip Exercises: Aerobic  Recumbent Bike L4 x 5 min      Knee/Hip Exercises: Standing   Forward Step Up 20 reps;Step Height: 8";Hand Hold: 1    Forward Step Up Limitations then straddle step ups 8'' no UE support x 15    Step Down 2 sets;10 reps    Other Standing Knee Exercises hip 3 way wash cloth    Other Standing Knee Exercises sidestep red TB and monster walk red TB, modified SL deadlift 10# KB x 15                       PT Short Term Goals - 12/08/21 0953       PT SHORT TERM GOAL #1   Title Ind with application of tape    Time 2    Period Weeks    Status New    Target Date 12/22/21      PT SHORT TERM GOAL #2   Title Ind with initial HEP    Time 2    Period Weeks    Status New               PT Long Term Goals - 04/18/22 9242       PT LONG TERM GOAL #1   Title I with advanced HEP for strength and flexiblity    Status On-going      PT LONG TERM GOAL #2   Title Return to gym activities with no pain    Status Achieved      PT LONG TERM GOAL #4   Title L knee ROM = to R knee    Status Achieved      PT LONG TERM GOAL #5   Title Pt will increase FOTO to >/=73     Status Achieved                   Plan - 04/18/22 0924     Clinical Impression Statement Pt has improved strength and ROM as well as activity tolerance. She has been able to return to her normal work out routine with decreased pain.    PT Next Visit Plan aquatic PT for learning exercises    PT Home Exercise Plan [email or web address]    Consulted and Agree with Plan of Care Patient             Patient will benefit from skilled therapeutic intervention in order to improve the following deficits and impairments:     Visit Diagnosis: Chronic pain of left knee  Muscle weakness (generalized)  Cramp and spasm  Other abnormalities of gait and mobility     Problem List Patient Active Problem List   Diagnosis Date Noted   At high risk for breast cancer 10/03/2021   Xerosis of skin 07/27/2021   Meniscal tear of left knee 06/30/2021   Hair loss 06/01/2021   Plantar fasciitis, bilateral 05/26/2021   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   Other fatigue 04/21/2021   History of COVID-19 01/18/2021   Attention deficit hyperactivity disorder (ADHD), 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   Vitamin D deficiency 05/21/2019   Severe obesity ([place]) 05/21/2019   Insulin resistance 04/01/2019   BMI 40.0-44.9, adult ([place]) 04/01/2019   Constipation due to slow transit 04/01/2019   History of pulmonary embolism 07/04/2017   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   GAD (generalized anxiety disorder) 04/08/2017   Shrimp allergy 09/24/2016   Allergy to yeast 09/24/2016   Prediabetes 03/17/2015   Dyslipidemia 03/17/2015   MRSA (  methicillin resistant Staphylococcus aureus) 01/14/2015   Family history of hypertrophic cardiomyopathy 04/23/2013   Raynaud disease 11/19/2012   Irritable bowel syndrome with diarrhea (colonoscopy 2007) 07/04/2012   Obesity, morbid (more than 100 lbs over ideal weight or BMI > 40) ([place]) 06/30/2012   Anxiety 06/30/2012   PCOS (polycystic ovarian syndrome)  06/30/2012    Telesa Jeancharles, PT 04/18/2022, 9:29 AM  [facility] [place], [place], [address] Phone: [phone]   Fax:  [phone]  Name: Atha Muradyan MRN: [id number] Date of Birth: 12/28/1975

## 2022-04-20 ENCOUNTER — Encounter (HOSPITAL_BASED_OUTPATIENT_CLINIC_OR_DEPARTMENT_OTHER): Payer: Self-pay | Admitting: Physical Therapy

## 2022-04-20 ENCOUNTER — Ambulatory Visit (HOSPITAL_BASED_OUTPATIENT_CLINIC_OR_DEPARTMENT_OTHER): Payer: 59 | Attending: Orthopaedic Surgery | Admitting: Physical Therapy

## 2022-04-20 DIAGNOSIS — R252 Cramp and spasm: Secondary | ICD-10-CM

## 2022-04-20 DIAGNOSIS — G8929 Other chronic pain: Secondary | ICD-10-CM | POA: Diagnosis not present

## 2022-04-20 DIAGNOSIS — M6281 Muscle weakness (generalized): Secondary | ICD-10-CM | POA: Diagnosis not present

## 2022-04-20 DIAGNOSIS — R2689 Other abnormalities of gait and mobility: Secondary | ICD-10-CM | POA: Insufficient documentation

## 2022-04-20 DIAGNOSIS — M25562 Pain in left knee: Secondary | ICD-10-CM | POA: Diagnosis not present

## 2022-04-20 NOTE — Therapy (Addendum)
[name] [address] [place], [place], [address] Phone: [phone]   Fax:  [phone]  Physical Therapy Treatment and Discharge  Patient Details  Name: Barbara Mcmahon MRN: [id number] Date of Birth: 11/05/1975 Referring Provider (PT): Hiram Gash, MD   Encounter Date: 04/20/2022   PT End of Session - 04/20/22 0911     Visit Number 5    Number of Visits 6    Date for PT Re-Evaluation 04/24/22    PT Start Time 0905    PT Stop Time 0945    PT Time Calculation (min) 40 min    Activity Tolerance Patient tolerated treatment well    Behavior During Therapy [facility] for tasks assessed/performed             Past Medical History:  Diagnosis Date   Abnormal Pap smear and cervical HPV (human papillomavirus)    Anxiety    Arthritis    BAck and sacral joint   Back pain    Colon polyps    Constipation    Gallbladder problem    Generalized anxiety disorder    GERD (gastroesophageal reflux disease)    IBS (irritable bowel syndrome)    Infertility, female    Joint pain    Multiple food allergies    Shellfish, cinnamon and yeast   Obesity    PCOS (polycystic ovarian syndrome)    Pneumonia    PONV (postoperative nausea and vomiting)    Prediabetes    Pulmonary embolism ([place])    2019   Seizure disorder ([place])    Vitamin D deficiency     Past Surgical History:  Procedure Laterality Date   BREAST BIOPSY Right 2019   benign   CHOLECYSTECTOMY     COLONOSCOPY  11/30/2021   colonoscopy every 5 yrs since hx of polyps around age 28   [place]  right   UPPER GASTROINTESTINAL ENDOSCOPY     UPPER GI ENDOSCOPY N/A 05/22/2021   Procedure: UPPER GI ENDOSCOPY;  Surgeon: [name], MD;  Location: [facility];  Service: General;  Laterality: N/A;   Pt seen for aquatic therapy today.  Water 3.25-4.5 ft in depth.Temp of water was 92.  Pt  entered/exited the pool via steps with 2 rail indep.  Intro to setting Walking Step ups 1 step then 2nd step forward SL TKE closed chain bototm step Backward step up leading R/L x10 Forward and backward amb between exercises for recovery Forward and backward walking yellow hand buoys submerged for core engagement Side lunge UE add/abd with yellow hand buoy Standing  ue supported yellow hand buoys: marching; add/abd; hip extension; hip flex Adductor set x 5 using BB STS from 4th step x5, with add set x10 Seated 4th step: flutter; add/abd 3 x 25-20 reps Straddling noodle: cycling; scissoring; skiing    Pt requires the buoyancy and hydrostatic pressure of water for support, and to offload joints by unweighting joint load by at least 50 % in navel deep water and by at least 75-80% in chest to neck deep water.  Viscosity of the water is needed for resistance of strengthening. Water current perturbations provides challenge to standing balance requiring increased core activation.                              PT Short Term  Goals - 12/08/21 0953       PT SHORT TERM GOAL #1   Title Ind with application of tape    Time 2    Period Weeks    Status New    Target Date 12/22/21      PT SHORT TERM GOAL #2   Title Ind with initial HEP    Time 2    Period Weeks    Status New               PT Long Term Goals - 04/18/22 0906       PT LONG TERM GOAL #1   Title I with advanced HEP for strength and flexiblity    Status On-going      PT LONG TERM GOAL #2   Title Return to gym activities with no pain    Status Achieved      PT LONG TERM GOAL #4   Title L knee ROM = to R knee    Status Achieved      PT LONG TERM GOAL #5   Title Pt will increase FOTO to >/=73    Status Achieved                   Plan - 04/20/22 1000     Clinical Impression Statement Pt tolerates aquatic sesssion without difficulty.  Instructed her through open and closed  chain exercises in varing depths. Focused on LE strength adding core engagement and aerobic capacity elements where able.  Directed her through HEP which is added to her "on land" program.  Strength and ROM wfl.  No discomfort during or after session.    Personal Factors and Comorbidities Age;Fitness    Examination-Activity Limitations Bend;Carry;Lift;Squat    Examination-Participation Restrictions Community Activity    Stability/Clinical Decision Making Stable/Uncomplicated    Clinical Decision Making Low    Rehab Potential Excellent    PT Treatment/Interventions ADLs/Self Care Home Management;Aquatic Therapy;Cryotherapy;Iontophoresis 38m/ml Dexamethasone;Moist Heat;Therapeutic activities;Therapeutic exercise;Neuromuscular re-education;Manual techniques;Patient/family education;Taping;Dry needling;Electrical Stimulation;Balance training;Gait training;Stair training;Functional mobility training;Passive range of motion    PT Home Exercise Plan [id number] Added aquatics    Consulted and Agree with Plan of Care Patient             Patient will benefit from skilled therapeutic intervention in order to improve the following deficits and impairments:  Decreased range of motion, Increased muscle spasms, Pain, Decreased activity tolerance, Impaired flexibility, Decreased strength  Visit Diagnosis: Chronic pain of left knee  Muscle weakness (generalized)  Cramp and spasm  Other abnormalities of gait and mobility     Problem List Patient Active Problem List   Diagnosis Date Noted   At high risk for breast cancer 10/03/2021   Xerosis of skin 07/27/2021   Meniscal tear of left knee 06/30/2021   Hair loss 06/01/2021   Plantar fasciitis, bilateral 05/26/2021   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   Other fatigue 04/21/2021   History of COVID-19 01/18/2021   Attention deficit hyperactivity disorder (ADHD), 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   Vitamin D deficiency 05/21/2019   Severe  obesity ([place] 05/21/2019   Insulin resistance 04/01/2019   BMI 40.0-44.9, adult ([place] 04/01/2019   Constipation due to slow transit 04/01/2019   History of pulmonary embolism 07/04/2017   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   GAD (generalized anxiety disorder) 04/08/2017   Shrimp allergy 09/24/2016   Allergy to yeast 09/24/2016   Prediabetes 03/17/2015   Dyslipidemia 03/17/2015   MRSA (methicillin resistant Staphylococcus aureus) 01/14/2015  Family history of hypertrophic cardiomyopathy 04/23/2013   Raynaud disease 11/19/2012   Irritable bowel syndrome with diarrhea (colonoscopy 2007) 07/04/2012   Obesity, morbid (more than 100 lbs over ideal weight or BMI > 40) ([place]) 06/30/2012   Anxiety 06/30/2012   PCOS (polycystic ovarian syndrome) 06/30/2012   PHYSICAL THERAPY DISCHARGE SUMMARY  Visits from Start of Care: 5  Current functional level related to goals / functional outcomes: Improved strength, decreased pain   Remaining deficits: See above   Education / Equipment: HEP   Patient agrees to discharge. Patient goals were met. Patient is being discharged due to meeting the stated rehab goals. Isabelle Course, PT,DPT08/16/239:57 AM  [facility]as) Kiely Cousar MPT 04/20/2022, 10:12 AM  [facility] [address] [place], [place], [address] Phone: [phone]   Fax:  [phone]  Name: Johnnay Pleitez MRN: [id number] Date of Birth: 06/16/1976

## 2022-05-16 ENCOUNTER — Encounter (INDEPENDENT_AMBULATORY_CARE_PROVIDER_SITE_OTHER): Payer: Self-pay

## 2022-05-17 ENCOUNTER — Other Ambulatory Visit: Payer: Self-pay | Admitting: Family Medicine

## 2022-05-18 ENCOUNTER — Encounter: Payer: Self-pay | Admitting: Family Medicine

## 2022-05-23 ENCOUNTER — Encounter: Payer: Self-pay | Admitting: Hematology & Oncology

## 2022-05-23 ENCOUNTER — Other Ambulatory Visit: Payer: Self-pay | Admitting: Family Medicine

## 2022-05-23 DIAGNOSIS — F902 Attention-deficit hyperactivity disorder, combined type: Secondary | ICD-10-CM

## 2022-05-24 ENCOUNTER — Other Ambulatory Visit: Payer: Self-pay

## 2022-05-24 ENCOUNTER — Encounter: Payer: Self-pay | Admitting: Dietician

## 2022-05-24 ENCOUNTER — Encounter: Payer: 59 | Attending: General Surgery | Admitting: Dietician

## 2022-05-24 DIAGNOSIS — R0602 Shortness of breath: Secondary | ICD-10-CM

## 2022-05-24 DIAGNOSIS — E669 Obesity, unspecified: Secondary | ICD-10-CM | POA: Insufficient documentation

## 2022-05-24 MED ORDER — APIXABAN 5 MG PO TABS: ORAL_TABLET | ORAL | 1 refills | Status: DC

## 2022-05-24 NOTE — Progress Notes (Signed)
Bariatric Nutrition Follow-Up Visit Medical Nutrition Therapy   Surgery Date: 05/22/2021   NUTRITION ASSESSMENT   Surgery date: 05/22/2021 Surgery type: sleeve Start weight at [facility]: 311.2 Height: 65 in Weight today: 259.6 pounds   Body Composition Scale 06/06/2021 07/19/2021 10/23/2021 01/23/2022 05/24/2022  Current Body Weight 292.2 277.2 253.6 251.3 259.6  Total Body Fat % 48.2 47.1 45 44.8 45.7  Visceral Fat '19 18 15 15 16  '$ Fat-Free Mass % 51.7 52.8 54.9 55.1 54.2   Total Body Water % 40.3 40.9 41.9 42 41.6  Muscle-Mass lbs 32.9 32.8 32.5 32.5 32.5  BMI 48.3 45.8 41.9 41.5 42.9  Body Fat Displacement              Torso  lbs 87.5 81 70.8 69.8 73.5         Left Leg  lbs 17.5 16.2 14.1 13.9 14.7         Right Leg  lbs 17.5 16.2 14.1 13.9 14.7         Left Arm  lbs 8.7 8.1 7.0 6.9 7.3         Right Arm   lbs 8.7 8.1 7.0 6.9 7.3   Clinical  Medical hx: prediabetes, Anxiety, arthritis, seizures, colon polyps, IBS, PCOS, 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 Vitamin D Allergies: nutmeg oil, shellfish, shrimp, yeast, Cinnamon   Lifestyle & Dietary Hx  Pt states she just returned from a vacation to [place], stating she got off track.  Pt states she is back on track. Pt states she is struggling with fluid intake, stating she is on conference calls throughout the day and doesn't have time to go to the restroom.  Pt states she will just have to drink more water and excuse herself for a moment, for restroom breaks. Pt states she is going to start her work-outs early with 6 am classes, two per week, plus another class when she can fit it in. Pt states she goes to a [employer] in [place], stating that they keep her accountable and states she sees her counselor once a week. Pt states the sugar cravings are coming back with a vengeance.  Pt states she can  eat a few [employer], or Sour Patch Kids and then ends up eating too many of them. Pt states she is scared of carbohydrates, but would like to try oatmeal when the weather starts to cool.  Dietitian discussed the need for at least 50 grams of carbohydrate, specifically in the form of complex carbohydrates coming from whole foods. A low carbohydrate diet defined as less than 130 grams per day.  Pt states her workout classes at the gym are pretty intense sometimes. Pt agreeable to introduce complex carbohydrates needed for energy to sustain intense gym workouts.  Pt agreeable/insistent to returning in 3 months to see how things are going.   Estimated daily fluid intake: less than 64 oz Estimated daily protein intake: 75+ g Supplements: procare multi (does not take the calcium supplement due to heavy dairy servings) Current average weekly physical activity: mensicus tear so limited in mobility   24-Hr Dietary Recall First Meal: protein bar, cheese stick and protein yogurt Snack: harvest snacks, cheese stick Second Meal: wrap with spinach, tomato and meat, carrots Snack: harvest snap (less than the serving)  Third Meal: chicken + veggie or black bean burger and salad Snack: yogurt  or cheese after gone to sleep or yasso bar Beverages: vitamin water, water, tea product, diet coke  Post-Op Goals/ Signs/ Symptoms Using straws: no Drinking while eating: no Chewing/swallowing difficulties: no Changes in vision: no Changes to mood/headaches: no Hair loss/changes to skin/nails: no Difficulty focusing/concentrating: no Sweating: no Dizziness/lightheadedness: no Palpitations: no  Carbonated/caffeinated beverages: no N/V/D/C/Gas: no Abdominal pain: no Dumping syndrome: no    NUTRITION DIAGNOSIS  Overweight/obesity ([place]-3.3) related to past poor dietary habits and physical inactivity as evidenced by completed bariatric surgery and following dietary guidelines for continued weight loss and healthy  nutrition status.     NUTRITION INTERVENTION Nutrition counseling (C-1) and education (E-2) to facilitate bariatric surgery goals, including:  Educated pt on emotional eating and identifying when it is occurring and what to replace the food with ie walking or playing with her dog The importance of consuming adequate calories as well as certain nutrients daily due to the body's need for essential vitamins, minerals, and fats The importance of daily physical activity and to reach a goal of at least 150 minutes of moderate to vigorous physical activity weekly (or as directed by their physician) due to benefits such as increased musculature and improved lab values The importance of intuitive eating specifically learning hunger-satiety cues and understanding the importance of learning a new body: The importance of mindful eating to avoid grazing behaviors Why you need complex carbohydrates: Whole grains and other complex carbohydrates are required to have a healthy diet. Whole grains provide fiber which can help with blood glucose levels and help keep you satiated. Fruits and starchy vegetables provide essential vitamins and minerals required for immune function, eyesight support, brain support, bone density, wound healing and many other functions within the body. According to the current evidenced based 2020-2025 Dietary Guidelines for Americans, complex carbohydrates are part of a healthy eating pattern which is associated with a decreased risk for type 2 diabetes, cancers, and cardiovascular disease.  Encouraged pt to continue to drink a minium 64 fluid ounces with half being plain water to satisfy proper hydration   Goals: -For stress relief to avoid emotional eating: Butterfly garden or pulling weeds or walking with knee wrapped  -Stop facebook group -Continue: physical activity -Continue: non-starchy vegetables -New: 64 oz of fluid, and excuse self from conference calls for restroom breaks -New:  at least 50 grams of complex carbohydrates   Handouts Provided Include  Bariatric MyPlate handout Protein/Complex Carbohydrate snack ideas list  Learning Style & Readiness for Change Teaching method utilized: Visual & Auditory  Demonstrated degree of understanding via: Teach Back  Readiness Level: action Barriers to learning/adherence to lifestyle change: none  RD's Notes for Next Visit Assess adherence to pt chosen goals    MONITORING & EVALUATION Dietary intake, weekly physical activity, body weight  Next Steps Patient is to follow-up in 3 months.

## 2022-05-25 NOTE — Telephone Encounter (Signed)
Please have patient's schedule 45-monthfollow-up for ADD medications. Thanks in advance.

## 2022-05-25 NOTE — Telephone Encounter (Signed)
Last OV: 11/20/21 Next OV: none on file Last RF: 02/15/22

## 2022-05-25 NOTE — Telephone Encounter (Signed)
Please have patient's schedule 25-monthfollow-up for ADD medications.

## 2022-05-28 NOTE — Telephone Encounter (Signed)
V.Mail left for patient to return call and schedule 6 month f/u for ADD meds.

## 2022-05-29 MED ORDER — AMPHETAMINE-DEXTROAMPHET ER 20 MG PO CP24: 20.0000 mg | ORAL_CAPSULE | ORAL | 0 refills | Status: DC

## 2022-05-29 NOTE — Telephone Encounter (Signed)
Last OV: 11/20/21 Next OV: none on file Last RF: 02/05/22

## 2022-05-31 ENCOUNTER — Encounter (HOSPITAL_BASED_OUTPATIENT_CLINIC_OR_DEPARTMENT_OTHER): Payer: Self-pay | Admitting: Obstetrics and Gynecology

## 2022-06-01 ENCOUNTER — Encounter (HOSPITAL_BASED_OUTPATIENT_CLINIC_OR_DEPARTMENT_OTHER): Payer: Self-pay | Admitting: Obstetrics and Gynecology

## 2022-06-01 NOTE — Progress Notes (Signed)
Spoke w/ via phone for pre-op interview--- pt Lab needs dos----   urine preg (per anes)/  pre-op orders pending            Lab results------ no COVID test -----patient states asymptomatic no test needed Arrive at ------- 0530 on 06-18-2022 NPO after MN NO Solid Food.  Clear liquids from MN until--- 0430 Med rec completed Medications to take morning of surgery ----- cymbalta Diabetic medication ----- n/a Patient instructed no nail polish to be worn day of surgery Patient instructed to bring photo id and insurance card day of surgery Patient aware to have Driver (ride ) / caregiver for 24 hours after surgery -- husband, franklin Patient Special Instructions ----- n/a Pre-Op special Istructions ----- called and left message for [place], [place] scheduler for Dr Helane Rima,  request clearance for pt's eliquis which is managed by Dr Marin Olp.  Pt stated with previous surgery's he has her stop two days prior but will call and get recommendation for this surgery Patient verbalized understanding of instructions that were given at this phone interview. Patient denies shortness of breath, chest pain, fever, cough at this phone interview.

## 2022-06-12 NOTE — H&P (Signed)
  46 year old Female presents for [facility] BTL, HTA and IUD removal. She desires permanent sterilization and desires treatment of menorrhagia.  History of PCOS.  Past Medical History:  Diagnosis Date   ADHD (attention deficit hyperactivity disorder)    Anticoagulated    eliquis---  managed by dr Marin Olp   DDD (degenerative disc disease), lumbosacral    Family history of hypertrophic cardiomyopathy    last echo in epic 02-24-2021  ef 55-60%   GAD (generalized anxiety disorder)    Generalized anxiety disorder    GERD (gastroesophageal reflux disease)    History of abnormal cervical Pap smear    History of febrile seizure    per pt as infant resolved when toddler   History of irritable bowel syndrome    when younger in college   History of methicillin resistant staphylococcus aureus (MRSA) 2016   01/ 2016 abdominal wound;  02/ 2016  cellulitis/ abscess of leg   History of pulmonary embolism 07/04/2017   left lung (recent travel)  ;  takes eliquis;   followed by dr Marin Olp   Hx of adenomatous colonic polyps    Infertility, female    Menorrhagia    PCOS (polycystic ovarian syndrome)    PONV (postoperative nausea and vomiting)    Prediabetes    Wears contact lenses    Past Surgical History:  Procedure Laterality Date   BREAST BIOPSY Right 2019   benign   CHOLECYSTECTOMY, LAPAROSCOPIC  2003   COLONOSCOPY WITH PROPOFOL  11/30/2021   by  dr Rush Landmark   DIAGNOSTIC LAPAROSCOPY  2011   by dr Helane Rima;  for infertility   DILATION AND EVACUATION  03/12/2011   '@[facility]'$   by dr Helane Rima;   missed ab   KNEE ARTHROSCOPY Left    2000  and 07/ 2023   LAPAROSCOPIC GASTRIC SLEEVE RESECTION WITH HIATAL HERNIA REPAIR  05/22/2021   '@[facility]'$   by dr Johnette Abraham. wilson   UPPER GI ENDOSCOPY N/A 05/22/2021   Procedure: UPPER GI ENDOSCOPY;  Surgeon: [name], MD;  Location: [facility];  Service: General;  Laterality: N/A;   Scheduled Meds: Continuous Infusions: PRN Meds:. Cinnamon, Morphine, Nutmeg oil (myristica oil),  Shellfish allergy, Codeine, Clonazepam, Menthol, Robitussin a-c [guaifenesin-codeine], Tramadol, and Yeast  Ht '5\' 5"'$  (1.651 m)   Wt 113.4 kg   BMI 41.60 kg/m  General alert and oriented Lung CTAB Car RRR Abdomen is soft and non tender Pelvic WNL  IMPRESSION: Desires permanent sterilization Menorrhagia  PLAN: [facility] BTL with Hassan trocar HTA IUD removal

## 2022-06-14 ENCOUNTER — Encounter: Payer: Self-pay | Admitting: Family Medicine

## 2022-06-14 ENCOUNTER — Ambulatory Visit (INDEPENDENT_AMBULATORY_CARE_PROVIDER_SITE_OTHER): Payer: 59 | Admitting: Family Medicine

## 2022-06-14 VITALS — BP 155/68 | HR 83 | Wt 264.4 lb

## 2022-06-14 DIAGNOSIS — E282 Polycystic ovarian syndrome: Secondary | ICD-10-CM | POA: Diagnosis not present

## 2022-06-14 DIAGNOSIS — R030 Elevated blood-pressure reading, without diagnosis of hypertension: Secondary | ICD-10-CM

## 2022-06-14 DIAGNOSIS — Z23 Encounter for immunization: Secondary | ICD-10-CM | POA: Diagnosis not present

## 2022-06-14 DIAGNOSIS — E8881 Metabolic syndrome: Secondary | ICD-10-CM | POA: Diagnosis not present

## 2022-06-14 DIAGNOSIS — F902 Attention-deficit hyperactivity disorder, combined type: Secondary | ICD-10-CM

## 2022-06-14 DIAGNOSIS — R7303 Prediabetes: Secondary | ICD-10-CM | POA: Diagnosis not present

## 2022-06-14 LAB — POCT GLYCOSYLATED HEMOGLOBIN (HGB A1C): HbA1c, POC (controlled diabetic range): 5.4 % (ref 0.0–7.0)

## 2022-06-14 MED ORDER — AMPHETAMINE-DEXTROAMPHETAMINE 10 MG PO TABS: 10.0000 mg | ORAL_TABLET | Freq: Every day | ORAL | 0 refills | Status: DC | PRN

## 2022-06-14 MED ORDER — AMPHETAMINE-DEXTROAMPHET ER 20 MG PO CP24: 20.0000 mg | ORAL_CAPSULE | ORAL | 0 refills | Status: DC

## 2022-06-14 NOTE — Progress Notes (Signed)
Established Patient Office Visit  Subjective   Patient ID: Barbara Mcmahon, female    DOB: 03-21-1976  Age: 46 y.o. MRN: [id number]  Chief Complaint  Patient presents with   ADHD    6 month f/u for adhd no concerns at this time needs refills.    HPI  ADD - Reports symptoms are well controlled on current regime. Denies any problems with insomnia, chest pain, palpitations, or SOB.  She feels that medications actually been really helpful and effective she feels like it really helps her focus and stay on task at work and multitask.  And she feels that it is really improved her executive functioning.  Impaired fasting glucose-no increased thirst or urination. No symptoms consistent with hypoglycemia.  She has surgery next week for a tubal ligation and uterine ablation.  Does have a little bit of head pressure today but no significant nasal congestion or fever.  She said she did do a COVID test just to make sure that it was negative.  Not currently exercising but planning on getting back on track after surgery.     ROS    Objective:     BP (!) 155/68   Pulse 83   Wt 264 lb 6.4 oz (119.9 kg)   SpO2 98%   BMI 44.00 kg/m    Physical Exam Vitals and nursing note reviewed.  Constitutional:      Appearance: She is well-developed.  HENT:     Head: Normocephalic and atraumatic.  Cardiovascular:     Rate and Rhythm: Normal rate and regular rhythm.     Heart sounds: Normal heart sounds.  Pulmonary:     Effort: Pulmonary effort is normal.     Breath sounds: Normal breath sounds.  Skin:    General: Skin is warm and dry.  Neurological:     Mental Status: She is alert and oriented to person, place, and time.  Psychiatric:        Behavior: Behavior normal.      Results for orders placed or performed in visit on 06/14/22  POCT glycosylated hemoglobin (Hb A1C)  Result Value Ref Range   Hemoglobin A1C     HbA1c POC (<> result, manual entry)     HbA1c, POC (prediabetic  range)     HbA1c, POC (controlled diabetic range) 5.4 0.0 - 7.0 %      The 10-year ASCVD risk score (Arnett DK, et al., 2019) is: 1.7%    Assessment & Plan:   Problem List Items Addressed This Visit       Endocrine   PCOS (polycystic ovarian syndrome) (Chronic)    A1c looks phenomenal today. Back in the normal range.  She is really doing a great job.  Follow-up in 6 months.      Insulin resistance    See note above.         Other   Prediabetes - Primary   Relevant Orders   POCT glycosylated hemoglobin (Hb A1C) (Completed)   Attention deficit hyperactivity disorder (ADHD), combined type   Relevant Medications   amphetamine-dextroamphetamine (ADDERALL XR) 20 MG 24 hr capsule (Start on 08/26/2022)   amphetamine-dextroamphetamine (ADDERALL) 10 MG tablet (Start on 08/26/2022)   amphetamine-dextroamphetamine (ADDERALL XR) 20 MG 24 hr capsule (Start on 07/27/2022)   amphetamine-dextroamphetamine (ADDERALL) 10 MG tablet (Start on 07/27/2022)   amphetamine-dextroamphetamine (ADDERALL XR) 20 MG 24 hr capsule (Start on 06/28/2022)   amphetamine-dextroamphetamine (ADDERALL) 10 MG tablet (Start on 06/28/2022)    Elevated blood pressure-recommend  repeat in about 2 weeks she did have an espresso before she came in today.  Return in about 6 months (around 12/13/2022) for ADHD.    Beatrice Lecher, MD

## 2022-06-14 NOTE — Assessment & Plan Note (Addendum)
A1c looks phenomenal today. Back in the normal range.  She is really doing a great job.  Follow-up in 6 months.

## 2022-06-14 NOTE — Assessment & Plan Note (Signed)
See note above

## 2022-06-17 NOTE — Anesthesia Preprocedure Evaluation (Signed)
Anesthesia Evaluation  Patient identified by MRN, date of birth, ID band Patient awake    Reviewed: Allergy & Precautions, NPO status , Patient's Chart, lab work & pertinent test results  History of Anesthesia Complications (+) PONV and history of anesthetic complications  Airway Mallampati: IV  TM Distance: >3 FB Neck ROM: Full  Mouth opening: Limited Mouth Opening  Dental no notable dental hx. (+) Dental Advisory Given, Teeth Intact   Pulmonary PE (on Eliquis, 2018)   Pulmonary exam normal breath sounds clear to auscultation       Cardiovascular negative cardio ROS Normal cardiovascular exam Rhythm:Regular Rate:Normal     Neuro/Psych Seizures - (last at age 46),  PSYCHIATRIC DISORDERS Anxiety ADHD   GI/Hepatic Neg liver ROS, hiatal hernia, GERD  Controlled,  Endo/Other  Morbid obesity (BMI 51)  Renal/GU negative Renal ROS     Musculoskeletal  (+) Arthritis ,   Abdominal (+) + obese,   Peds  Hematology negative hematology ROS (+)   Anesthesia Other Findings Day of surgery medications reviewed with patient.  Reproductive/Obstetrics negative OB ROS                           Anesthesia Physical  Anesthesia Plan  ASA: 3  Anesthesia Plan: General   Post-op Pain Management: Tylenol PO (pre-op)*, Toradol IV (intra-op)* and Gabapentin PO (pre-op)*   Induction: Intravenous  PONV Risk Score and Plan: 4 or greater and Scopolamine patch - Pre-op, Midazolam, Dexamethasone, Ondansetron, Propofol infusion, Treatment may vary due to age or medical condition and Aprepitant  Airway Management Planned: Oral ETT and Video Laryngoscope Planned  Additional Equipment: None  Intra-op Plan:   Post-operative Plan: Extubation in OR  Informed Consent: I have reviewed the patients History and Physical, chart, labs and discussed the procedure including the risks, benefits and alternatives for the proposed  anesthesia with the patient or authorized representative who has indicated his/her understanding and acceptance.     Dental advisory given  Plan Discussed with: CRNA  Anesthesia Plan Comments:        Anesthesia Quick Evaluation

## 2022-06-18 ENCOUNTER — Ambulatory Visit (HOSPITAL_BASED_OUTPATIENT_CLINIC_OR_DEPARTMENT_OTHER): Payer: 59 | Admitting: Anesthesiology

## 2022-06-18 ENCOUNTER — Encounter (HOSPITAL_BASED_OUTPATIENT_CLINIC_OR_DEPARTMENT_OTHER)
Admission: RE | Disposition: A | Discharge status: Home / Self Care | Payer: Self-pay | Source: Home / Self Care | Attending: Obstetrics and Gynecology

## 2022-06-18 ENCOUNTER — Other Ambulatory Visit: Payer: Self-pay

## 2022-06-18 ENCOUNTER — Encounter (HOSPITAL_BASED_OUTPATIENT_CLINIC_OR_DEPARTMENT_OTHER): Payer: Self-pay | Admitting: Obstetrics and Gynecology

## 2022-06-18 ENCOUNTER — Ambulatory Visit (HOSPITAL_BASED_OUTPATIENT_CLINIC_OR_DEPARTMENT_OTHER)
Admission: RE | Admit: 2022-06-18 | Discharge: 2022-06-18 | Disposition: A | Discharge status: Home / Self Care | Payer: 59 | Attending: Obstetrics and Gynecology | Admitting: Obstetrics and Gynecology

## 2022-06-18 DIAGNOSIS — K219 Gastro-esophageal reflux disease without esophagitis: Secondary | ICD-10-CM | POA: Diagnosis not present

## 2022-06-18 DIAGNOSIS — E661 Drug-induced obesity: Secondary | ICD-10-CM | POA: Diagnosis not present

## 2022-06-18 DIAGNOSIS — N920 Excessive and frequent menstruation with regular cycle: Secondary | ICD-10-CM | POA: Diagnosis not present

## 2022-06-18 DIAGNOSIS — E282 Polycystic ovarian syndrome: Secondary | ICD-10-CM | POA: Diagnosis not present

## 2022-06-18 DIAGNOSIS — Z6841 Body Mass Index (BMI) 40.0 and over, adult: Secondary | ICD-10-CM | POA: Diagnosis not present

## 2022-06-18 DIAGNOSIS — F901 Attention-deficit hyperactivity disorder, predominantly hyperactive type: Secondary | ICD-10-CM | POA: Insufficient documentation

## 2022-06-18 DIAGNOSIS — Z8669 Personal history of other diseases of the nervous system and sense organs: Secondary | ICD-10-CM | POA: Insufficient documentation

## 2022-06-18 DIAGNOSIS — Z01818 Encounter for other preprocedural examination: Secondary | ICD-10-CM

## 2022-06-18 DIAGNOSIS — Z302 Encounter for sterilization: Secondary | ICD-10-CM | POA: Insufficient documentation

## 2022-06-18 HISTORY — DX: Presence of spectacles and contact lenses: Z97.3

## 2022-06-18 HISTORY — DX: Personal history of adenomatous and serrated colon polyps: Z86.0101

## 2022-06-18 HISTORY — DX: Generalized anxiety disorder: F41.1

## 2022-06-18 HISTORY — DX: Attention-deficit hyperactivity disorder, unspecified type: F90.9

## 2022-06-18 HISTORY — DX: Other intervertebral disc degeneration, lumbosacral region: M51.37

## 2022-06-18 HISTORY — DX: Personal history of other diseases of the digestive system: Z87.19

## 2022-06-18 HISTORY — PX: LAPAROSCOPIC TUBAL LIGATION: SHX1937

## 2022-06-18 HISTORY — DX: Personal history of other diseases of the female genital tract: Z87.42

## 2022-06-18 HISTORY — DX: Family history of ischemic heart disease and other diseases of the circulatory system: Z82.49

## 2022-06-18 HISTORY — PX: IUD REMOVAL: SHX5392

## 2022-06-18 HISTORY — PX: DILITATION & CURRETTAGE/HYSTROSCOPY WITH HYDROTHERMAL ABLATION: SHX5570

## 2022-06-18 HISTORY — DX: Other intervertebral disc degeneration, lumbosacral region without mention of lumbar back pain or lower extremity pain: M51.379

## 2022-06-18 HISTORY — DX: Long term (current) use of anticoagulants: Z79.01

## 2022-06-18 HISTORY — DX: Excessive and frequent menstruation with regular cycle: N92.0

## 2022-06-18 HISTORY — DX: Personal history of other specified conditions: Z87.898

## 2022-06-18 HISTORY — DX: Personal history of colonic polyps: Z86.010

## 2022-06-18 LAB — CBC
HCT: 40.2 % (ref 36.0–46.0)
Hemoglobin: 13.4 g/dL (ref 12.0–15.0)
MCH: 31.2 pg (ref 26.0–34.0)
MCHC: 33.3 g/dL (ref 30.0–36.0)
MCV: 93.7 fL (ref 80.0–100.0)
Platelets: 251 10*3/uL (ref 150–400)
RBC: 4.29 MIL/uL (ref 3.87–5.11)
RDW: 12.8 % (ref 11.5–15.5)
WBC: 7.3 10*3/uL (ref 4.0–10.5)
nRBC: 0 % (ref 0.0–0.2)

## 2022-06-18 LAB — POCT PREGNANCY, URINE: Preg Test, Ur: NEGATIVE

## 2022-06-18 SURGERY — LIGATION, FALLOPIAN TUBE, LAPAROSCOPIC
Anesthesia: General | Site: Vagina

## 2022-06-18 MED ORDER — LIDOCAINE HCL 1 % IJ SOLN
INTRAMUSCULAR | Status: DC | PRN
  Administered 2022-06-18: 10 mL

## 2022-06-18 MED ORDER — ONDANSETRON HCL 4 MG/2ML IJ SOLN
INTRAMUSCULAR | Status: AC
  Filled 2022-06-18: qty 2

## 2022-06-18 MED ORDER — ROCURONIUM BROMIDE 10 MG/ML (PF) SYRINGE
PREFILLED_SYRINGE | INTRAVENOUS | Status: AC
  Filled 2022-06-18: qty 10

## 2022-06-18 MED ORDER — PROMETHAZINE HCL 25 MG/ML IJ SOLN: 6.2500 mg | INTRAMUSCULAR | Status: DC | PRN

## 2022-06-18 MED ORDER — MIDAZOLAM HCL 2 MG/2ML IJ SOLN
INTRAMUSCULAR | Status: DC | PRN
  Administered 2022-06-18: 2 mg via INTRAVENOUS

## 2022-06-18 MED ORDER — PROPOFOL 10 MG/ML IV BOLUS
INTRAVENOUS | Status: AC
  Filled 2022-06-18: qty 20

## 2022-06-18 MED ORDER — ACETAMINOPHEN 500 MG PO TABS: 1000.0000 mg | ORAL_TABLET | Freq: Once | ORAL | Status: DC

## 2022-06-18 MED ORDER — ROCURONIUM BROMIDE 10 MG/ML (PF) SYRINGE
PREFILLED_SYRINGE | INTRAVENOUS | Status: DC | PRN
  Administered 2022-06-18: 60 mg via INTRAVENOUS

## 2022-06-18 MED ORDER — OXYCODONE HCL 5 MG/5ML PO SOLN: 5.0000 mg | Freq: Once | ORAL | Status: DC | PRN

## 2022-06-18 MED ORDER — FENTANYL CITRATE (PF) 100 MCG/2ML IJ SOLN
INTRAMUSCULAR | Status: DC | PRN
  Administered 2022-06-18: 100 ug via INTRAVENOUS

## 2022-06-18 MED ORDER — HYDROMORPHONE HCL 1 MG/ML IJ SOLN: 0.2500 mg | INTRAMUSCULAR | Status: DC | PRN

## 2022-06-18 MED ORDER — PROPOFOL 10 MG/ML IV BOLUS
INTRAVENOUS | Status: DC | PRN
  Administered 2022-06-18: 200 mg via INTRAVENOUS
  Administered 2022-06-18: 50 mg via INTRAVENOUS

## 2022-06-18 MED ORDER — KETOROLAC TROMETHAMINE 30 MG/ML IJ SOLN
INTRAMUSCULAR | Status: DC | PRN
  Administered 2022-06-18: 30 mg via INTRAVENOUS

## 2022-06-18 MED ORDER — LIDOCAINE 2% (20 MG/ML) 5 ML SYRINGE
INTRAMUSCULAR | Status: DC | PRN
  Administered 2022-06-18: 60 mg via INTRAVENOUS

## 2022-06-18 MED ORDER — ACETAMINOPHEN 500 MG PO TABS
ORAL_TABLET | ORAL | Status: AC
  Filled 2022-06-18: qty 2

## 2022-06-18 MED ORDER — DEXAMETHASONE SODIUM PHOSPHATE 10 MG/ML IJ SOLN
INTRAMUSCULAR | Status: DC | PRN
  Administered 2022-06-18: 10 mg via INTRAVENOUS

## 2022-06-18 MED ORDER — DEXAMETHASONE SODIUM PHOSPHATE 10 MG/ML IJ SOLN
INTRAMUSCULAR | Status: AC
  Filled 2022-06-18: qty 1

## 2022-06-18 MED ORDER — MEPERIDINE HCL 25 MG/ML IJ SOLN: 6.2500 mg | INTRAMUSCULAR | Status: DC | PRN

## 2022-06-18 MED ORDER — KETOROLAC TROMETHAMINE 30 MG/ML IJ SOLN
INTRAMUSCULAR | Status: AC
  Filled 2022-06-18: qty 1

## 2022-06-18 MED ORDER — LACTATED RINGERS IV SOLN: INTRAVENOUS | Status: DC

## 2022-06-18 MED ORDER — OXYCODONE HCL 5 MG PO TABS: 5.0000 mg | ORAL_TABLET | Freq: Once | ORAL | Status: DC | PRN

## 2022-06-18 MED ORDER — ONDANSETRON HCL 4 MG/2ML IJ SOLN
INTRAMUSCULAR | Status: DC | PRN
  Administered 2022-06-18: 4 mg via INTRAVENOUS

## 2022-06-18 MED ORDER — POVIDONE-IODINE 10 % EX SWAB: 2.0000 | Freq: Once | CUTANEOUS | Status: DC

## 2022-06-18 MED ORDER — BUPIVACAINE HCL (PF) 0.25 % IJ SOLN
INTRAMUSCULAR | Status: DC | PRN
  Administered 2022-06-18: 6 mL

## 2022-06-18 MED ORDER — 0.9 % SODIUM CHLORIDE (POUR BTL) OPTIME
TOPICAL | Status: DC | PRN
  Administered 2022-06-18: 500 mL

## 2022-06-18 MED ORDER — MIDAZOLAM HCL 2 MG/2ML IJ SOLN
INTRAMUSCULAR | Status: AC
  Filled 2022-06-18: qty 2

## 2022-06-18 MED ORDER — LIDOCAINE HCL (PF) 2 % IJ SOLN
INTRAMUSCULAR | Status: AC
  Filled 2022-06-18: qty 5

## 2022-06-18 MED ORDER — GABAPENTIN 300 MG PO CAPS
300.0000 mg | ORAL_CAPSULE | Freq: Once | ORAL | Status: AC
  Administered 2022-06-18: 300 mg via ORAL

## 2022-06-18 MED ORDER — GABAPENTIN 300 MG PO CAPS
ORAL_CAPSULE | ORAL | Status: AC
  Filled 2022-06-18: qty 1

## 2022-06-18 MED ORDER — OXYCODONE HCL 5 MG PO TABS: 5.0000 mg | ORAL_TABLET | ORAL | 0 refills | Status: DC | PRN

## 2022-06-18 MED ORDER — SCOPOLAMINE 1 MG/3DAYS TD PT72
1.0000 | MEDICATED_PATCH | TRANSDERMAL | Status: DC
  Administered 2022-06-18: 1.5 mg via TRANSDERMAL

## 2022-06-18 MED ORDER — SUGAMMADEX SODIUM 200 MG/2ML IV SOLN
INTRAVENOUS | Status: DC | PRN
  Administered 2022-06-18: 200 mg via INTRAVENOUS

## 2022-06-18 MED ORDER — ACETAMINOPHEN 500 MG PO TABS
500.0000 mg | ORAL_TABLET | Freq: Once | ORAL | Status: AC
  Administered 2022-06-18: 500 mg via ORAL

## 2022-06-18 MED ORDER — SODIUM CHLORIDE 0.9 % IV SOLN
2.0000 g | INTRAVENOUS | Status: AC
  Administered 2022-06-18: 2 g via INTRAVENOUS

## 2022-06-18 MED ORDER — ARTIFICIAL TEARS OPHTHALMIC OINT
TOPICAL_OINTMENT | OPHTHALMIC | Status: AC
  Filled 2022-06-18: qty 3.5

## 2022-06-18 MED ORDER — SODIUM CHLORIDE 0.9 % IR SOLN
Status: DC | PRN
  Administered 2022-06-18: 3000 mL

## 2022-06-18 MED ORDER — FENTANYL CITRATE (PF) 100 MCG/2ML IJ SOLN
INTRAMUSCULAR | Status: AC
  Filled 2022-06-18: qty 2

## 2022-06-18 MED ORDER — SODIUM CHLORIDE 0.9 % IV SOLN
INTRAVENOUS | Status: AC
  Filled 2022-06-18: qty 2

## 2022-06-18 MED ORDER — SCOPOLAMINE 1 MG/3DAYS TD PT72
MEDICATED_PATCH | TRANSDERMAL | Status: AC
  Filled 2022-06-18: qty 1

## 2022-06-18 MED ORDER — AMISULPRIDE (ANTIEMETIC) 5 MG/2ML IV SOLN: 10.0000 mg | Freq: Once | INTRAVENOUS | Status: DC | PRN

## 2022-06-18 SURGICAL SUPPLY — 36 items
CATH ROBINSON RED A/P 16FR (CATHETERS) ×3 IMPLANT
COVER MAYO STAND STRL (DRAPES) IMPLANT
DRSG COVADERM PLUS 2X2 (GAUZE/BANDAGES/DRESSINGS) IMPLANT
DURAPREP 26ML APPLICATOR (WOUND CARE) ×3 IMPLANT
ELECT REM PT RETURN 9FT ADLT (ELECTROSURGICAL) ×2
ELECTRODE REM PT RTRN 9FT ADLT (ELECTROSURGICAL) ×3 IMPLANT
GLOVE BIO SURGEON STRL SZ 6.5 (GLOVE) ×6 IMPLANT
GLOVE BIOGEL PI IND STRL 6.5 (GLOVE) IMPLANT
GLOVE BIOGEL PI IND STRL 7.0 (GLOVE) IMPLANT
GOWN STRL REUS W/TWL LRG LVL3 (GOWN DISPOSABLE) ×6 IMPLANT
GOWN STRL REUS W/TWL XL LVL3 (GOWN DISPOSABLE) IMPLANT
IV NS IRRIG 3000ML ARTHROMATIC (IV SOLUTION) ×6 IMPLANT
KIT TURNOVER CYSTO (KITS) ×2 IMPLANT
NDL INSUFFLATION 14GA 120MM (NEEDLE) ×3 IMPLANT
NDL INSUFFLATION 14GA 150MM (NEEDLE) IMPLANT
NEEDLE INSUFFLATION 14GA 120MM (NEEDLE) ×2 IMPLANT
NEEDLE INSUFFLATION 14GA 150MM (NEEDLE) IMPLANT
NS IRRIG 500ML POUR BTL (IV SOLUTION) ×2 IMPLANT
PACK LAPAROSCOPY BASIN (CUSTOM PROCEDURE TRAY) ×3 IMPLANT
PACK TRENDGUARD 450 HYBRID PRO (MISCELLANEOUS) IMPLANT
PACK VAGINAL MINOR WOMEN LF (CUSTOM PROCEDURE TRAY) ×3 IMPLANT
PAD OB MATERNITY 4.3X12.25 (PERSONAL CARE ITEMS) ×3 IMPLANT
PAD PREP 24X48 CUFFED NSTRL (MISCELLANEOUS) ×3 IMPLANT
PENCIL SMOKE EVACUATOR (MISCELLANEOUS) IMPLANT
SET GENESYS HTA PROCERVA (MISCELLANEOUS) IMPLANT
SET TUBE SMOKE EVAC HIGH FLOW (TUBING) ×3 IMPLANT
SUT MNCRL AB 3-0 PS2 18 (SUTURE) ×3 IMPLANT
SUT VICRYL 0 UR6 27IN ABS (SUTURE) ×3 IMPLANT
TOWEL OR 17X26 10 PK STRL BLUE (TOWEL DISPOSABLE) ×6 IMPLANT
TRAY FOLEY W/BAG SLVR 14FR LF (SET/KITS/TRAYS/PACK) ×3 IMPLANT
TRENDGUARD 450 HYBRID PRO PACK (MISCELLANEOUS) ×2
TROCAR BALLN 12MMX100 BLUNT (TROCAR) IMPLANT
TROCAR Z-THREAD BLADED 11X100M (TROCAR) ×3 IMPLANT
TUBE CONNECTING 12X1/4 (SUCTIONS) IMPLANT
WARMER LAPAROSCOPE (MISCELLANEOUS) ×3 IMPLANT
YANKAUER SUCT BULB TIP NO VENT (SUCTIONS) IMPLANT

## 2022-06-18 NOTE — OR Nursing (Signed)
0749H- IUD removed at this time by Dr. Helane Rima

## 2022-06-18 NOTE — Discharge Instructions (Signed)
     No acetaminophen/Tylenol until after 12:45 pm today if needed.   No ibuprofen, Advil, Aleve, Motrin, ketorolac, meloxicam, naproxen, or other NSAIDS until after 2:45 pm today if needed.    Post Anesthesia Home Care Instructions  Activity: Get plenty of rest for the remainder of the day. A responsible individual must stay with you for 24 hours following the procedure.  For the next 24 hours, DO NOT: -Drive a car -Paediatric nurse -Drink alcoholic beverages -Take any medication unless instructed by your physician -Make any legal decisions or sign important papers.  Meals: Start with liquid foods such as gelatin or soup. Progress to regular foods as tolerated. Avoid greasy, spicy, heavy foods. If nausea and/or vomiting occur, drink only clear liquids until the nausea and/or vomiting subsides. Call your physician if vomiting continues.  Special Instructions/Symptoms: Your throat may feel dry or sore from the anesthesia or the breathing tube placed in your throat during surgery. If this causes discomfort, gargle with warm salt water. The discomfort should disappear within 24 hours.  If you had a scopolamine patch placed behind your ear for the management of post- operative nausea and/or vomiting:  1. The medication in the patch is effective for 72 hours, after which it should be removed.  Wrap patch in a tissue and discard in the trash. Wash hands thoroughly with soap and water. 2. You may remove the patch earlier than 72 hours if you experience unpleasant side effects which may include dry mouth, dizziness or visual disturbances. 3. Avoid touching the patch. Wash your hands with soap and water after contact with the patch.

## 2022-06-18 NOTE — Anesthesia Postprocedure Evaluation (Signed)
Anesthesia Post Note  Patient: Alvita Fana  Procedure(s) Performed: ATTEMPTED LAPAROSCOPY (Bilateral: Abdomen) INTRAUTERINE DEVICE (IUD) REMOVAL (Vagina ) DILATATION & CURETTAGE/HYSTEROSCOPY WITH HYDROTHERMAL ABLATION (Vagina )     Patient location during evaluation: PACU Anesthesia Type: General Level of consciousness: sedated and patient cooperative Pain management: pain level controlled Vital Signs Assessment: post-procedure vital signs reviewed and stable Respiratory status: spontaneous breathing Cardiovascular status: stable Anesthetic complications: no   No notable events documented.  Last Vitals:  Vitals:   06/18/22 0930 06/18/22 1000  BP: (!) 143/81 135/77  Pulse: 73 70  Resp: 11 15  Temp:  36.4 C  SpO2: 100% 100%    Last Pain:  Vitals:   06/18/22 1000  TempSrc:   PainSc: 0-No pain                 Nolon Nations

## 2022-06-18 NOTE — Op Note (Addendum)
Barbara Mcmahon, Barbara Mcmahon MEDICAL RECORD NO: [id number] ACCOUNT NO: [id number] DATE OF BIRTH: July 16, 1976 FACILITY: [facility] LOCATION: [facility] PHYSICIAN: Hassan Blackshire L. Helane Rima, MD  Operative Report   DATE OF PROCEDURE: 06/18/2022  PREOPERATIVE DIAGNOSIS:  Desires permanent sterilization and excessive bleeding  POSTOPERATIVE DIAGNOSES:  Desires permanent sterilization and excessive bleeding and probable intraabdominal adhesions.  PROCEDURES:   1.  Attempted diagnostic laparoscopy. 2.  Removal of IUD. 3.  Dilatation and uterine curettage. 4.  Diagnostic hysteroscopy. 5.  Hydrothermal endometrial ablation.  SURGEON:  Jarvin Ogren L. Helane Rima, MD.  ANESTHESIA:  General with local.  ESTIMATED BLOOD LOSS:  Minimal.  DESCRIPTION OF PROCEDURE:  The patient was taken to the operating room.  She was intubated without difficulty.  She was then prepped and draped in the usual sterile fashion.  In and out catheter was used to empty the bladder.  Speculum was inserted into  the vagina.  The IUD was removed, noted to be completely intact and sent to pathology for confirmation and verification. A uterine manipulator was inserted.  Attention was turned to the abdomen.  This patient has had previous abdominal surgeries and the  patient's BMI and very loose skin made it desirable to perform an open flap laparoscopy with a Hasson trocar. After local was infiltrated at the umbilicus region a small transverse incision was made just inferior to the umbilicus.  This was carried down  to the fascia.  There was a large vessel that started bleeding just above the fascia, which we were then able to mobilize with curved Kelly clamp and a suture ligature.  After that was performed, Hemostasis was very good.  I identified the fascia,  elevated it and entered smooth Mayo scissors.  At this point, then we proceeded to visualize the peritoneum, which was very deep in the incision and we could see when I grasped the peritoneum with  a hemostat that there was what appeared to be some dense  scar tissue there as well as probable adhesions that we could see bowel that did not fall away when I tried to elevate the peritoneum and it also by palpation felt very adherent in that area.  My suspicion was that the patient may have some adhesions  from her prior gastric sleeve surgery, which was performed robotically.  At this point, because of the depth from the skin to the peritoneum as well as the likelihood that there were some dense adhesions, I felt that there was some risk that the patient  might encounter a bowel injury if I attempted to enter the peritoneum there and so at that point, we decided not to perform the tubal ligation, so I removed the clamps, closed the fascia using 0 Vicryl suture and closed the skin using 3-0 Vicryl in a  subcuticular fashion and Steri-Strips were applied and a dressing was applied.  At this point, we went down to the vagina, placed a speculum in the vagina and grasped the cervix with a tenaculum and dilated the cervical internal os gently after a  paracervical block was performed.  I inserted a sharp uterine curette.  The uterus was thoroughly curetted of all tissue was sent to pathology.  The HTA hysteroscope was then inserted and the cavity appeared to be fine except that there was a notable  amount of endometrial tissue.  We then performed a fluid check with 0 mL and with good visualization, performed a 10-minute HTA according to the [employer] specifications.  At the end of the procedure, the intact  instrument was removed.  The patient  was then awoken from anesthesia and extubated.  She went to recovery room in stable condition.  I will discuss the inability to perform the tubal ligation with her husband postop and with her in the postop visit to the office and she may be a patient  that might agree to progestin-only contraceptives until she goes through menopause.   PUS D: 06/18/2022 2:52:13  pm T: 06/18/2022 7:35:00 pm  JOB: [id number]/ [id number]

## 2022-06-18 NOTE — Transfer of Care (Signed)
Immediate Anesthesia Transfer of Care Note  Patient: Barbara Mcmahon  Procedure(s) Performed: Procedure(s) (LRB): ATTEMPTED LAPAROSCOPY (Bilateral) INTRAUTERINE DEVICE (IUD) REMOVAL (N/A) DILATATION & CURETTAGE/HYSTEROSCOPY WITH HYDROTHERMAL ABLATION (N/A)  Patient Location: PACU  Anesthesia Type: General  Level of Consciousness: awake, alert  and oriented  Airway & Oxygen Therapy: Patient Spontanous Breathing   Post-op Assessment: Report given to PACU RN and Post -op Vital signs reviewed and stable  Post vital signs: Reviewed and stable  Complications: No apparent anesthesia complications  Last Vitals:  Vitals Value Taken Time  BP 143/73 06/18/22 0900  Temp 36.5 C 06/18/22 0900  Pulse 74 06/18/22 0910  Resp 13 06/18/22 0910  SpO2 95 % 06/18/22 0910  Vitals shown include unvalidated device data.  Last Pain:  Vitals:   06/18/22 0900  TempSrc:   PainSc: 0-No pain      Patients Stated Pain Goal: 6 (51/02/58 5277)  Complications: No notable events documented.

## 2022-06-18 NOTE — Anesthesia Procedure Notes (Signed)
Procedure Name: Intubation Date/Time: 06/18/2022 7:40 AM  Performed by: Mechele Claude, CRNAPre-anesthesia Checklist: Patient identified, Emergency Drugs available, Suction available and Patient being monitored Patient Re-evaluated:Patient Re-evaluated prior to induction Oxygen Delivery Method: Circle system utilized Preoxygenation: Pre-oxygenation with 100% oxygen Induction Type: IV induction Ventilation: Mask ventilation without difficulty Laryngoscope Size: Glidescope and 3 Grade View: Grade I Tube type: Oral Tube size: 7.0 mm Number of attempts: 1 Airway Equipment and Method: Oral airway and Rigid stylet Placement Confirmation: ETT inserted through vocal cords under direct vision, positive ETCO2 and breath sounds checked- equal and bilateral Secured at: 22 cm Tube secured with: Tape Dental Injury: Teeth and Oropharynx as per pre-operative assessment  Comments: Used Glidescope from the beginning for a suspected difficult intubation due to patient size and small mouth opening.

## 2022-06-18 NOTE — Progress Notes (Signed)
Barbara Mcmahon on the chart BP 135/81   Pulse 83   Temp 97.8 F (36.6 C) (Oral)   Resp 17   Ht '5\' 5"'$  (1.651 m)   Wt 118.8 kg   SpO2 100%   BMI 43.60 kg/m  Results for orders placed or performed during the hospital encounter of 06/18/22 (from the past 24 hour(s))  Pregnancy, urine POC     Status: None   Collection Time: 06/18/22  6:02 AM  Result Value Ref Range   Preg Test, Ur NEGATIVE NEGATIVE  CBC     Status: None   Collection Time: 06/18/22  6:52 AM  Result Value Ref Range   WBC 7.3 4.0 - 10.5 K/uL   RBC 4.29 3.87 - 5.11 MIL/uL   Hemoglobin 13.4 12.0 - 15.0 g/dL   HCT 40.2 36.0 - 46.0 %   MCV 93.7 80.0 - 100.0 fL   MCH 31.2 26.0 - 34.0 pg   MCHC 33.3 30.0 - 36.0 g/dL   RDW 12.8 11.5 - 15.5 %   Platelets 251 150 - 400 K/uL   nRBC 0.0 0.0 - 0.2 %   Scheduled Meds:  povidone-iodine  2 Application Topical Once   scopolamine  1 patch Transdermal Q72H   Continuous Infusions:  cefoTEtan (CEFOTAN) IV     lactated ringers     lactated ringers     PRN Meds:. Prior to Admission medications   Medication Sig Start Date End Date Taking? Authorizing Provider  acetaminophen (TYLENOL) 650 MG CR tablet Take 1,300 mg by mouth as needed for pain.   Yes [provider]  amphetamine-dextroamphetamine (ADDERALL XR) 20 MG 24 hr capsule Take 1 capsule (20 mg total) by mouth every morning. 08/26/22  Yes Hali Marry, MD  apixaban (ELIQUIS) 5 MG TABS tablet TAKE 1 TABLET(5 MG) BY MOUTH TWICE DAILY Patient taking differently: Take 5 mg by mouth 2 (two) times daily. TAKE 1 TABLET(5 MG) BY MOUTH TWICE DAILY 05/24/22  Yes Ennever, Rudell Cobb, MD  cetirizine (ZYRTEC) 10 MG tablet Take 10 mg by mouth at bedtime.   Yes [provider]  DULoxetine (CYMBALTA) 60 MG capsule TAKE 2 CAPSULES BY MOUTH DAILY 05/18/22  Yes Hali Marry, MD  EPINEPHrine 0.3 mg/0.3 mL IJ SOAJ injection Inject 0.3 mg into the muscle once as needed (anaphylaxis). 09/26/20  Yes Hali Marry, MD   levonorgestrel (MIRENA) 20 MCG/DAY IUD 1 each by Intrauterine route once.   Yes [provider]  Melatonin 5 MG CHEW Chew by mouth at bedtime.   Yes [provider]  Multiple Vitamins-Minerals (BARIATRIC MULTIVITAMINS/IRON PO) Take 1 tablet by mouth 2 (two) times daily.   Yes [provider]  amphetamine-dextroamphetamine (ADDERALL) 10 MG tablet Take 1 tablet (10 mg total) by mouth daily as needed (ADHD). 08/26/22   Hali Marry, MD   Exam no change  Proceed with [facility] IUD removal D and C, Hysteroscopy and HTA  Consent is signed  Oxycodone for post op

## 2022-06-18 NOTE — Brief Op Note (Signed)
06/18/2022  8:48 AM  PATIENT:  Barbara Mcmahon  46 y.o. female  PRE-OPERATIVE DIAGNOSIS:  Desires permanent sterilization Menorrhagia  POST-OPERATIVE DIAGNOSIS:   Same Probable intraabdominal adhesions  PROCEDURE:  Procedure(s): ATTEMPTED LAPAROSCOPY (Bilateral) INTRAUTERINE DEVICE (IUD) REMOVAL (N/A) DILATATION & CURETTAGE/HYSTEROSCOPY WITH HYDROTHERMAL ABLATION (N/A)  SURGEON:  Surgeon(s) and Role:    * Dian Queen, MD - Primary  PHYSICIAN ASSISTANT:   ASSISTANTS: none   ANESTHESIA:   local and general  EBL: per anesthesia record  BLOOD ADMINISTERED:none  DRAINS: none   LOCAL MEDICATIONS USED:  LIDOCAINE   SPECIMEN:  Source of Specimen:  IUD and uterine currettings  DISPOSITION OF SPECIMEN:  PATHOLOGY  COUNTS:  YES  TOURNIQUET:  * No tourniquets in log *  DICTATION: .Other Dictation: Dictation Number dictated  PLAN OF CARE: Discharge to home after PACU  PATIENT DISPOSITION:  PACU - hemodynamically stable.   Delay start of Pharmacological VTE agent (>24hrs) due to surgical blood loss or risk of bleeding: not applicable

## 2022-06-19 ENCOUNTER — Encounter (HOSPITAL_BASED_OUTPATIENT_CLINIC_OR_DEPARTMENT_OTHER): Payer: Self-pay | Admitting: Obstetrics and Gynecology

## 2022-06-19 LAB — SURGICAL PATHOLOGY

## 2022-06-28 ENCOUNTER — Ambulatory Visit: Payer: 59

## 2022-06-29 ENCOUNTER — Ambulatory Visit (INDEPENDENT_AMBULATORY_CARE_PROVIDER_SITE_OTHER): Payer: 59 | Admitting: Family Medicine

## 2022-06-29 VITALS — BP 133/84 | HR 82 | Temp 98.7°F | Ht 64.5 in | Wt 264.1 lb

## 2022-06-29 DIAGNOSIS — R030 Elevated blood-pressure reading, without diagnosis of hypertension: Secondary | ICD-10-CM | POA: Diagnosis not present

## 2022-06-29 NOTE — Progress Notes (Signed)
Agree with documentation as above.  Would still like to see the systolic less than 710 but will continue to keep an eye on it.  Much better than last time.  Beatrice Lecher, MD

## 2022-06-29 NOTE — Progress Notes (Signed)
Patient is here for blood pressure check. Denies trouble sleeping, palpitations, dizziness, lightheadedness, blurry vision, chest pain, shortness of breath, headaches and/or medication problems.   Patient's blood pressure was out of goal range. Patient sat for 15 minutes. Blood pressure recheck was at goal.   Patient informed to schedule next appointment with provider in March 2024.

## 2022-07-01 ENCOUNTER — Encounter: Payer: Self-pay | Admitting: Family Medicine

## 2022-07-02 ENCOUNTER — Other Ambulatory Visit: Payer: Self-pay

## 2022-07-02 ENCOUNTER — Telehealth: Payer: Self-pay

## 2022-07-02 DIAGNOSIS — Z91013 Allergy to seafood: Secondary | ICD-10-CM

## 2022-07-02 MED ORDER — EPINEPHRINE 0.3 MG/0.3ML IJ SOAJ: 0.3000 mg | Freq: Once | INTRAMUSCULAR | 1 refills | Status: DC | PRN

## 2022-07-02 NOTE — Progress Notes (Deleted)
Needs epi pen refilled

## 2022-07-02 NOTE — Telephone Encounter (Signed)
Needed epi pen refill

## 2022-07-27 ENCOUNTER — Inpatient Hospital Stay: Payer: 59

## 2022-07-27 ENCOUNTER — Inpatient Hospital Stay: Payer: 59 | Admitting: Hematology & Oncology

## 2022-08-14 ENCOUNTER — Ambulatory Visit: Payer: 59 | Admitting: Dietician

## 2022-08-17 ENCOUNTER — Encounter: Payer: 59 | Attending: General Surgery | Admitting: Dietician

## 2022-08-17 ENCOUNTER — Encounter: Payer: Self-pay | Admitting: Dietician

## 2022-08-17 VITALS — Ht 65.0 in | Wt 262.2 lb

## 2022-08-17 DIAGNOSIS — E669 Obesity, unspecified: Secondary | ICD-10-CM | POA: Insufficient documentation

## 2022-08-17 NOTE — Progress Notes (Signed)
Bariatric Nutrition Follow-Up Visit Medical Nutrition Therapy   Surgery Date: 05/22/2021   NUTRITION ASSESSMENT   Surgery date: 05/22/2021 Surgery type: sleeve Start weight at [facility]: 311.2 Height: 65 in Weight today: 262.2 pounds   Body Composition Scale 06/06/2021 07/19/2021 10/23/2021 01/23/2022 05/24/2022 08/17/2022  Current Body Weight 292.2 277.2 253.6 251.3 259.6 262.2  Total Body Fat % 48.2 47.1 45 44.8 45.7 45.9  Visceral Fat '19 18 15 15 16 16  '$ Fat-Free Mass % 51.7 52.8 54.9 55.1 54.2 54.0   Total Body Water % 40.3 40.9 41.9 42 41.6 41.5  Muscle-Mass lbs 32.9 32.8 32.5 32.5 32.5 32.5  BMI 48.3 45.8 41.9 41.5 42.9 43.3  Body Fat Displacement               Torso  lbs 87.5 81 70.8 69.8 73.5 74.7         Left Leg  lbs 17.5 16.2 14.1 13.9 14.7 14.9         Right Leg  lbs 17.5 16.2 14.1 13.9 14.7 14.9         Left Arm  lbs 8.7 8.1 7.0 6.9 7.3 7.4         Right Arm   lbs 8.7 8.1 7.0 6.9 7.3 7.4   Clinical  Medical hx: prediabetes, Anxiety, arthritis, seizures, colon polyps, IBS, PCOS, 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 Vitamin D Allergies: nutmeg oil, shellfish, shrimp, yeast, Cinnamon   Lifestyle & Dietary Hx  Pt states she needs her class on Wednesday for sanity, stating she has joined the [facility] for water exercises.   Pt states her daughter is autistic and used to be afraid of water.  Pt states she was able get her daughter into water therapy and now she loves the water.  Pt states this allows the entire family to participate in physical activity.  Pt states she has some water weights that she will use. Pt states she can do her Wednesday class for an hour and before surgery she couldn't do that type of exercise. Pt states the past few months have been very stressful, stating she is pleased that she has not gained more weight and has maintained.  Pt  states stressful times may be coming to an end. Pt states she has been drinking with her meals, stating she gets so thirsty. Dietitian discussed that hydrating in between meals will make it easier to not drink during meals.  Estimated daily fluid intake: less than 64 oz Estimated daily protein intake: 75+ g Supplements: procare multi (does not take the calcium supplement due to heavy dairy servings) Current average weekly physical activity: every Wednesday (strength/cardio)   24-Hr Dietary Recall First Meal: quest protein bar, lowfat cheese stick, greek yogurt. Snack: chocolate humus with strawberries Second Meal: low carb wrap with spinach, tomato and meat, carrots lunch meat, with cheese, harvest snaps Snack: harvest snap (less than the serving) or fruit Third Meal: chicken Tzatziki sauce (homemade) + veggie or black bean burger and salad Snack: yogurt or cheese after gone to sleep Beverages: vitamin water zero, water, tea product, diet coke, iced vanilla late  Post-Op Goals/ Signs/ Symptoms Using straws: no Drinking while eating: no Chewing/swallowing difficulties: no Changes in vision: no Changes to mood/headaches: no Hair loss/changes to skin/nails: no Difficulty focusing/concentrating: no Sweating: no Dizziness/lightheadedness: no Palpitations: no  Carbonated/caffeinated beverages: no N/V/D/C/Gas: no Abdominal pain: no Dumping  syndrome: no    NUTRITION DIAGNOSIS  Overweight/obesity ([place]-3.3) related to past poor dietary habits and physical inactivity as evidenced by completed bariatric surgery and following dietary guidelines for continued weight loss and healthy nutrition status.     NUTRITION INTERVENTION Nutrition counseling (C-1) and education (E-2) to facilitate bariatric surgery goals, including:  Educated pt on emotional eating and identifying when it is occurring and what to replace the food with ie walking or playing with her dog The importance of consuming  adequate calories as well as certain nutrients daily due to the body's need for essential vitamins, minerals, and fats The importance of daily physical activity and to reach a goal of at least 150 minutes of moderate to vigorous physical activity weekly (or as directed by their physician) due to benefits such as increased musculature and improved lab values The importance of intuitive eating specifically learning hunger-satiety cues and understanding the importance of learning a new body: The importance of mindful eating to avoid grazing behaviors Encouraged pt to continue to drink a minium 64 fluid ounces with half being plain water to satisfy proper hydration   Goals: -For stress relief to avoid emotional eating: Butterfly garden or pulling weeds or walking with knee wrapped  -Stop facebook group -Continue: non-starchy vegetables -Continue: 64 oz of fluid, and excuse self from conference calls for restroom breaks -Continue: at least 50 grams of complex carbohydrates -re-engage/Continue: physical activity (Wednesday class and water exercises at the [facility]) -New: keep water bottle/container with you to help sipping between meals.   Handouts Provided Include  Pre-Op Goals handout (reviewed a few goals)  Learning Style & Readiness for Change Teaching method utilized: Visual & Auditory  Demonstrated degree of understanding via: Teach Back  Readiness Level: action Barriers to learning/adherence to lifestyle change: none  RD's Notes for Next Visit Assess adherence to pt chosen goals    MONITORING & EVALUATION Dietary intake, weekly physical activity, body weight  Next Steps Patient is to follow-up in 3 months.

## 2022-08-28 ENCOUNTER — Encounter: Payer: Self-pay | Admitting: Hematology & Oncology

## 2022-08-28 ENCOUNTER — Inpatient Hospital Stay: Payer: 59

## 2022-08-28 ENCOUNTER — Inpatient Hospital Stay: Payer: 59 | Attending: Hematology & Oncology | Admitting: Hematology & Oncology

## 2022-08-28 ENCOUNTER — Other Ambulatory Visit: Payer: Self-pay

## 2022-08-28 VITALS — BP 118/54 | HR 68 | Temp 98.1°F | Resp 17 | Ht 65.0 in | Wt 270.0 lb

## 2022-08-28 DIAGNOSIS — Z793 Long term (current) use of hormonal contraceptives: Secondary | ICD-10-CM | POA: Diagnosis not present

## 2022-08-28 DIAGNOSIS — E282 Polycystic ovarian syndrome: Secondary | ICD-10-CM

## 2022-08-28 DIAGNOSIS — I2699 Other pulmonary embolism without acute cor pulmonale: Secondary | ICD-10-CM | POA: Insufficient documentation

## 2022-08-28 DIAGNOSIS — Z79899 Other long term (current) drug therapy: Secondary | ICD-10-CM | POA: Diagnosis not present

## 2022-08-28 DIAGNOSIS — Z7901 Long term (current) use of anticoagulants: Secondary | ICD-10-CM | POA: Insufficient documentation

## 2022-08-28 DIAGNOSIS — Z86711 Personal history of pulmonary embolism: Secondary | ICD-10-CM

## 2022-08-28 DIAGNOSIS — Z888 Allergy status to other drugs, medicaments and biological substances status: Secondary | ICD-10-CM | POA: Insufficient documentation

## 2022-08-28 DIAGNOSIS — Z885 Allergy status to narcotic agent status: Secondary | ICD-10-CM | POA: Insufficient documentation

## 2022-08-28 LAB — CBC WITH DIFFERENTIAL (CANCER CENTER ONLY)
Abs Immature Granulocytes: 0.02 10*3/uL (ref 0.00–0.07)
Basophils Absolute: 0 10*3/uL (ref 0.0–0.1)
Basophils Relative: 1 %
Eosinophils Absolute: 0.1 10*3/uL (ref 0.0–0.5)
Eosinophils Relative: 1 %
HCT: 37.7 % (ref 36.0–46.0)
Hemoglobin: 12.3 g/dL (ref 12.0–15.0)
Immature Granulocytes: 0 %
Lymphocytes Relative: 39 %
Lymphs Abs: 2.7 10*3/uL (ref 0.7–4.0)
MCH: 30.5 pg (ref 26.0–34.0)
MCHC: 32.6 g/dL (ref 30.0–36.0)
MCV: 93.5 fL (ref 80.0–100.0)
Monocytes Absolute: 0.6 10*3/uL (ref 0.1–1.0)
Monocytes Relative: 9 %
Neutro Abs: 3.4 10*3/uL (ref 1.7–7.7)
Neutrophils Relative %: 50 %
Platelet Count: 267 10*3/uL (ref 150–400)
RBC: 4.03 MIL/uL (ref 3.87–5.11)
RDW: 12.4 % (ref 11.5–15.5)
WBC Count: 6.9 10*3/uL (ref 4.0–10.5)
nRBC: 0 % (ref 0.0–0.2)

## 2022-08-28 LAB — CMP (CANCER CENTER ONLY)
ALT: 13 U/L (ref 0–44)
AST: 15 U/L (ref 15–41)
Albumin: 3.8 g/dL (ref 3.5–5.0)
Alkaline Phosphatase: 67 U/L (ref 38–126)
Anion gap: 6 (ref 5–15)
BUN: 22 mg/dL — ABNORMAL HIGH (ref 6–20)
CO2: 26 mmol/L (ref 22–32)
Calcium: 8.8 mg/dL — ABNORMAL LOW (ref 8.9–10.3)
Chloride: 108 mmol/L (ref 98–111)
Creatinine: 0.68 mg/dL (ref 0.44–1.00)
GFR, Estimated: >60 mL/min (ref >60)
Glucose, Bld: 97 mg/dL (ref 70–99)
Potassium: 4 mmol/L (ref 3.5–5.1)
Sodium: 140 mmol/L (ref 135–145)
Total Bilirubin: 0.3 mg/dL (ref 0.3–1.2)
Total Protein: 6.4 g/dL — ABNORMAL LOW (ref 6.5–8.1)

## 2022-08-28 NOTE — Progress Notes (Signed)
Hematology and Oncology Follow Up Visit  Amalia Edgecombe [id number] [birth year] 46 y.o. 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 twice daily-complete 1 year of therapy in September 2019 Eilquis 2.5 mg po BID - start 06/08/2018     Interim History:  Ms. Brandenburg is back for follow-up.  We last saw her back in April.  Since then, she been doing quite well.  The big news is that she and her family will be gone over to [place] and to [place] next year.  She has taken her children on a trip.  The really big news is that they will be going to the [employer] in [place].  She really is looking forward to this.  She is doing well with the Eliquis.  She is on low-dose Eliquis.  I think this is a great idea because she travels so much.  She is still trying to lose weight.  She is being followed by nutrition very closely.  She is quite busy at work.  She travels for her job.  She has had no problems with bleeding.  She has had no cough or shortness of breath.  She had no chest wall pain.  She has had no change in bowel or bladder habits.  She has had no leg swelling.  Overall, I would say performance status is probably ECOG 0.    Medications:  Current Outpatient Medications:    acetaminophen (TYLENOL) 650 MG CR tablet, Take 1,300 mg by mouth as needed for pain., Disp: , Rfl:    amphetamine-dextroamphetamine (ADDERALL XR) 20 MG 24 hr capsule, Take 1 capsule (20 mg total) by mouth every morning., Disp: 30 capsule, Rfl: 0   amphetamine-dextroamphetamine (ADDERALL) 10 MG tablet, Take 1 tablet (10 mg total) by mouth daily as needed (ADHD)., Disp: 30 tablet, Rfl: 0   apixaban (ELIQUIS) 5 MG TABS tablet, TAKE 1 TABLET(5 MG) BY MOUTH TWICE DAILY (Patient taking differently: Take 5 mg by mouth 2 (two) times daily. TAKE 1 TABLET(5 MG) BY MOUTH TWICE DAILY), Disp: 180 tablet, Rfl: 1    cetirizine (ZYRTEC) 10 MG tablet, Take 10 mg by mouth at bedtime., Disp: , Rfl:    DULoxetine (CYMBALTA) 60 MG capsule, TAKE 2 CAPSULES BY MOUTH DAILY, Disp: 180 capsule, Rfl: 1   EPINEPHrine 0.3 mg/0.3 mL IJ SOAJ injection, Inject 0.3 mg into the muscle once as needed (anaphylaxis)., Disp: 1 each, Rfl: 1   levonorgestrel (MIRENA) 20 MCG/DAY IUD, 1 each by Intrauterine route once., Disp: , Rfl:    Melatonin 5 MG CHEW, Chew by mouth at bedtime., Disp: , Rfl:    Multiple Vitamins-Minerals (BARIATRIC MULTIVITAMINS/IRON PO), Take 1 tablet by mouth 2 (two) times daily., Disp: , Rfl:   Allergies:  Allergies  Allergen Reactions   Cinnamon Shortness Of Breath and Rash    Wheezing    Morphine Hives, Itching and Rash    Other reaction(s): Other (See Comments) BEHAVIOR CHANGES Big mood changes irritable   Nutmeg Oil (Myristica Oil) Shortness Of Breath and Rash    wheezing Other reaction(s): Not available   Shellfish Allergy Anaphylaxis, Hives and Swelling    Throat closes and wheezing;   per pt shrimp only   Shrimp Extract Allergy Skin Test     Other reaction(s): Wheezing Other reaction(s): wheezing   Codeine Hives, Itching and Rash    Pt says she can take percocet  Tramadol Itching and Rash   Candida Albicans     Other reaction(s): Not available   Clonazepam Itching   Menthol Hives   Robitussin A-C [Guaifenesin-Codeine] Itching   Yeast Hives    Past Medical History, Surgical history, Social history, and Family History were reviewed and updated.  Review of Systems: Review of Systems  Constitutional: Negative.   HENT:  Negative.    Eyes: Negative.   Respiratory: Negative.    Cardiovascular: Negative.   Gastrointestinal: Negative.   Endocrine: Negative.   Genitourinary: Negative.    Musculoskeletal: Negative.   Skin: Negative.   Neurological: Negative.   Hematological: Negative.   Psychiatric/Behavioral: Negative.      Physical Exam:  height is '5\' 5"'$  (1.651 m) and weight  is 270 lb (122.5 kg). Her oral temperature is 98.1 F (36.7 C). Her blood pressure is 118/54 (abnormal) and her pulse is 68. Her respiration is 17 and oxygen saturation is 99%.   Wt Readings from Last 3 Encounters:  08/28/22 270 lb (122.5 kg)  08/17/22 262 lb 3.2 oz (118.9 kg)  06/29/22 264 lb 1.3 oz (119.8 kg)    Physical Exam Vitals reviewed.  HENT:     Head: Normocephalic and atraumatic.  Eyes:     Pupils: Pupils are equal, round, and reactive to light.  Cardiovascular:     Rate and Rhythm: Normal rate and regular rhythm.     Heart sounds: Normal heart sounds.  Pulmonary:     Effort: Pulmonary effort is normal.     Breath sounds: Normal breath sounds.  Abdominal:     General: Bowel sounds are normal.     Palpations: Abdomen is soft.  Musculoskeletal:        General: No tenderness or deformity. Normal range of motion.     Cervical back: Normal range of motion.  Lymphadenopathy:     Cervical: No cervical adenopathy.  Skin:    General: Skin is warm and dry.     Findings: No erythema or rash.  Neurological:     Mental Status: She is alert and oriented to person, place, and time.  Psychiatric:        Behavior: Behavior normal.        Thought Content: Thought content normal.        Judgment: Judgment normal.      Lab Results  Component Value Date   WBC 6.9 08/28/2022   HGB 12.3 08/28/2022   HCT 37.7 08/28/2022   MCV 93.5 08/28/2022   PLT 267 08/28/2022     Chemistry      Component Value Date/Time   NA 140 08/28/2022 0838   NA 141 04/20/2020 1441   NA 140 10/11/2017 1122   K 4.0 08/28/2022 0838   K 4.2 10/11/2017 1122   CL 108 08/28/2022 0838   CL 103 10/11/2017 1122   CO2 26 08/28/2022 0838   CO2 29 10/11/2017 1122   BUN 22 (H) 08/28/2022 0838   BUN 14 04/20/2020 1441   BUN 10 10/11/2017 1122   CREATININE 0.68 08/28/2022 0838   CREATININE 0.71 12/22/2020 0930      Component Value Date/Time   CALCIUM 8.8 (L) 08/28/2022 0838   CALCIUM 8.7 10/11/2017  1122   ALKPHOS 67 08/28/2022 0838   ALKPHOS 83 10/11/2017 1122   AST 15 08/28/2022 0838   ALT 13 08/28/2022 0838   ALT 36 10/11/2017 1122   BILITOT 0.3 08/28/2022 0838      Impression and Plan: Ms. Lodato is  a 46 year-old white female.  She has polycystic ovaries.  She had a pulmonary embolism in September 2018.  This was idiopathic.  We  have her on Eliquis.  She likes to be on low-dose Eliquis.  She does travel for work.  She just feels more comfortable traveling.  I had no problems with her traveling to [place].  I told her make sure that she hydrates well when she flies.  We will still plan for another follow-up in 6 months.  Hopefully, the weight will be down a little bit.  Volanda Napoleon, MD 11/21/20239:36 AM

## 2022-10-06 ENCOUNTER — Other Ambulatory Visit: Payer: Self-pay | Admitting: Family Medicine

## 2022-10-06 DIAGNOSIS — F902 Attention-deficit hyperactivity disorder, combined type: Secondary | ICD-10-CM

## 2022-10-10 MED ORDER — AMPHETAMINE-DEXTROAMPHET ER 20 MG PO CP24: 20.0000 mg | ORAL_CAPSULE | ORAL | 0 refills | Status: DC

## 2022-10-10 NOTE — Telephone Encounter (Signed)
Told to   Return in about 6 months (around 12/28/2022) for ADHD F/U.

## 2022-10-22 ENCOUNTER — Encounter: Payer: Self-pay | Admitting: Family Medicine

## 2022-10-24 ENCOUNTER — Telehealth: Payer: Self-pay | Admitting: Family Medicine

## 2022-10-24 NOTE — Telephone Encounter (Signed)
Patient called stated has had a headache for about 10 days and now says it's turned into a migraine please advise

## 2022-10-25 NOTE — Telephone Encounter (Signed)
Please call patient for appt.  

## 2022-10-25 NOTE — Telephone Encounter (Signed)
Ned sappt

## 2022-10-26 ENCOUNTER — Encounter: Payer: Self-pay | Admitting: Medical-Surgical

## 2022-10-26 ENCOUNTER — Ambulatory Visit (INDEPENDENT_AMBULATORY_CARE_PROVIDER_SITE_OTHER): Payer: 59 | Admitting: Medical-Surgical

## 2022-10-26 VITALS — BP 131/76 | HR 106 | Resp 20 | Ht 65.0 in | Wt 276.5 lb

## 2022-10-26 DIAGNOSIS — G4452 New daily persistent headache (NDPH): Secondary | ICD-10-CM

## 2022-10-26 DIAGNOSIS — Z20822 Contact with and (suspected) exposure to covid-19: Secondary | ICD-10-CM | POA: Diagnosis not present

## 2022-10-26 LAB — POC COVID19 BINAXNOW: SARS Coronavirus 2 Ag: NEGATIVE

## 2022-10-26 LAB — POCT INFLUENZA A/B
Influenza A, POC: NEGATIVE
Influenza B, POC: NEGATIVE

## 2022-10-26 MED ORDER — DEXAMETHASONE SODIUM PHOSPHATE 10 MG/ML IJ SOLN
10.0000 mg | Freq: Once | INTRAMUSCULAR | Status: AC
  Administered 2022-10-26: 10 mg via INTRAMUSCULAR

## 2022-10-26 MED ORDER — KETOROLAC TROMETHAMINE 30 MG/ML IJ SOLN
30.0000 mg | Freq: Once | INTRAMUSCULAR | Status: AC
  Administered 2022-10-26: 30 mg via INTRAMUSCULAR

## 2022-10-26 NOTE — Progress Notes (Signed)
Established Patient Office Visit  Subjective   Patient ID: Barbara Mcmahon, female   DOB: May 13, 1976 Age: 47 y.o. MRN: [id number]   Chief Complaint  Patient presents with   Headache   HPI Pleasant 47 year old female presenting today for the following:  Headaches Onset: 12 days ago Location: frontal/forehead, radiates to the top, bilateral Duration: 12 days Frequency: constant with periods or worsening Characters: dull, fuzzy, rated 4/10 Alleviating factors: Tylenol, Flexeril Aggravating factors: bending over,  Associated symptoms: nausea when bending over, sinus pressure Neuro changes: None Vision changes: None Family history of Migraine: Yes, mother and [employer]      Fever: None      Neck pain/stiffness: None      Vision/speech difficulty: None       Focal weakness or numbness: None      Altered mental status: None      Trauma: None      Worse in am: Yes      Anticoagulant use: Yes      Immunocompromise: No   Objective:    Vitals:   10/26/22 1011  BP: 131/76  Pulse: (!) 106  Resp: 20  Height: '5\' 5"'$  (1.651 m)  Weight: 276 lb 8 oz (125.4 kg)  SpO2: 100%  BMI (Calculated): 46.01   Physical Exam Vitals and nursing note reviewed.  Constitutional:      General: She is not in acute distress.    Appearance: Normal appearance. She is well-developed. She is obese. She is not ill-appearing.  HENT:     Head: Normocephalic and atraumatic.  Cardiovascular:     Rate and Rhythm: Normal rate and regular rhythm.     Pulses: Normal pulses.     Heart sounds: Normal heart sounds.  Pulmonary:     Effort: Pulmonary effort is normal. No respiratory distress.     Breath sounds: Normal breath sounds. No wheezing, rhonchi or rales.  Skin:    General: Skin is warm and dry.  Neurological:     General: No focal deficit present.     Mental Status: She is alert and oriented to person, place, and time. Mental status is at baseline.  Psychiatric:        Mood and Affect: Mood  normal.        Behavior: Behavior normal.        Thought Content: Thought content normal.        Judgment: Judgment normal.    Results for orders placed or performed in visit on 10/26/22 (from the past 24 hour(s))  POC COVID-19     Status: None   Collection Time: 10/26/22 11:59 AM  Result Value Ref Range   SARS Coronavirus 2 Ag Negative Negative  POCT Influenza A/B     Status: None   Collection Time: 10/26/22 11:59 AM  Result Value Ref Range   Influenza A, POC Negative Negative   Influenza B, POC Negative Negative       The 10-year ASCVD risk score (Arnett DK, et al., 2019) is: 1.2%   Values used to calculate the score:     Age: 9 years     Sex: Female     Is Non-Hispanic African American: No     Diabetic: No     Tobacco smoker: No     Systolic Blood Pressure: 151 mmHg     Is BP treated: No     HDL Cholesterol: 37 mg/dL     Total Cholesterol: 157 mg/dL   Assessment & Plan:  1. New daily persistent headache New daily persistent headache worse in the morning. Since she is on anticoagulants, concern for secondary etiology. Ordering MRI brain for further evaluation. To treat current symptoms, Decadron '10mg'$   and Toradol '30mg'$  given IM.  - dexamethasone (DECADRON) injection 10 mg - ketorolac (TORADOL) 30 MG/ML injection 30 mg - MR Brain Wo Contrast; Future  2. Exposure to confirmed case of COVID-19 POCT flu and COVID negative.  - POCT Influenza A/B - POC COVID-19  Return if symptoms worsen or fail to improve.  ___________________________________________ Clearnce Sorrel, DNP, APRN, FNP-BC [facility] [place]

## 2022-10-29 ENCOUNTER — Ambulatory Visit: Payer: Self-pay

## 2022-10-29 ENCOUNTER — Ambulatory Visit (INDEPENDENT_AMBULATORY_CARE_PROVIDER_SITE_OTHER): Payer: 59 | Admitting: Sports Medicine

## 2022-10-29 DIAGNOSIS — S83207S Unspecified tear of unspecified meniscus, current injury, left knee, sequela: Secondary | ICD-10-CM

## 2022-10-29 DIAGNOSIS — M25562 Pain in left knee: Secondary | ICD-10-CM | POA: Diagnosis not present

## 2022-10-29 MED ORDER — TRIAMCINOLONE ACETONIDE 40 MG/ML IJ SUSP
40.0000 mg | Freq: Once | INTRAMUSCULAR | Status: AC
  Administered 2022-10-29: 40 mg via INTRAMUSCULAR

## 2022-10-29 NOTE — Progress Notes (Signed)
    Procedures performed today:    Procedure: Real-time Ultrasound Guided injection of the left knee Device: Samsung HS60  Verbal informed consent obtained.  Time-out conducted.  Noted no overlying erythema, induration, or other signs of local infection.  Skin prepped in a sterile fashion.  Local anesthesia: Topical Ethyl chloride.  With sterile technique and under real time ultrasound guidance: Trace effusion noted, 1 cc Kenalog 40, 2 cc lidocaine, 2 cc bupivacaine injected easily Completed without difficulty  Advised to call if fevers/chills, erythema, induration, drainage, or persistent bleeding.  Images permanently stored and available for review in PACS.  Impression: Technically successful ultrasound guided injection.  Independent interpretation of notes and tests performed by another provider:   None.  Brief History, Exam, Impression, and Recommendations:    Meniscal tear of left knee This is a very pleasant 47 year old female, she has chronic left knee pain, ultimately failed conservative treatment so we did an MRI, she had a meniscal tear and is status post arthroscopic debridement. Did well for a while, now having recurrence of pain a year later, she did desire an injection so we did a steroid injection today. Return to see me 6 weeks as needed.    ____________________________________________ Gwen Her. Dianah Field, M.D., ABFM., CAQSM., AME. Primary Care and Sports Medicine [facility]  Adjunct Professor of [employer]  Risk manager

## 2022-10-29 NOTE — Assessment & Plan Note (Signed)
This is a very pleasant 47 year old female, she has chronic left knee pain, ultimately failed conservative treatment so we did an MRI, she had a meniscal tear and is status post arthroscopic debridement. Did well for a while, now having recurrence of pain a year later, she did desire an injection so we did a steroid injection today. Return to see me 6 weeks as needed.

## 2022-10-30 ENCOUNTER — Encounter: Payer: Self-pay | Admitting: Medical-Surgical

## 2022-11-03 ENCOUNTER — Ambulatory Visit (INDEPENDENT_AMBULATORY_CARE_PROVIDER_SITE_OTHER): Payer: 59

## 2022-11-03 DIAGNOSIS — G4452 New daily persistent headache (NDPH): Secondary | ICD-10-CM | POA: Diagnosis not present

## 2022-11-06 ENCOUNTER — Other Ambulatory Visit: Payer: Self-pay | Admitting: *Deleted

## 2022-11-06 ENCOUNTER — Encounter: Payer: Self-pay | Admitting: Family Medicine

## 2022-11-06 DIAGNOSIS — G4452 New daily persistent headache (NDPH): Secondary | ICD-10-CM

## 2022-11-06 MED ORDER — DULOXETINE HCL 60 MG PO CPEP: 120.0000 mg | ORAL_CAPSULE | Freq: Every day | ORAL | 3 refills | Status: DC

## 2022-11-07 MED ORDER — AMITRIPTYLINE HCL 25 MG PO TABS: 25.0000 mg | ORAL_TABLET | Freq: Every day | ORAL | 1 refills | Status: DC

## 2022-11-09 ENCOUNTER — Ambulatory Visit (INDEPENDENT_AMBULATORY_CARE_PROVIDER_SITE_OTHER): Payer: 59 | Admitting: Family Medicine

## 2022-11-09 VITALS — BP 145/83 | HR 112 | Temp 98.4°F | Ht 65.0 in | Wt 271.0 lb

## 2022-11-09 DIAGNOSIS — J019 Acute sinusitis, unspecified: Secondary | ICD-10-CM | POA: Diagnosis not present

## 2022-11-09 MED ORDER — AMOXICILLIN-POT CLAVULANATE 875-125 MG PO TABS: 1.0000 | ORAL_TABLET | Freq: Two times a day (BID) | ORAL | 0 refills | Status: DC

## 2022-11-09 MED ORDER — FLUCONAZOLE 150 MG PO TABS: 150.0000 mg | ORAL_TABLET | Freq: Once | ORAL | 0 refills | Status: AC

## 2022-11-09 NOTE — Progress Notes (Signed)
   Acute Office Visit  Subjective:     Patient ID: Barbara Mcmahon, female    DOB: 1976-08-09, 47 y.o.   MRN: [id number]  Chief Complaint  Patient presents with   Ear Problem    HPI Patient is in today for ear pressure, low grade fever, drainage,mucous, dry mouths and fatigue that started 6 days ago.  Taking Sudafed, using nasal saline, and a supplement.  She came in today because she actually feels worse today.  But she also flew yesterday and says that the pressure in her ears was intense.  Home COVID test yesterday was negative.  ROS      Objective:    BP (!) 145/83   Pulse (!) 112   Temp 98.4 F (36.9 C) (Oral)   Ht '5\' 5"'$  (1.651 m)   Wt 271 lb (122.9 kg)   SpO2 100%   BMI 45.10 kg/m    Physical Exam Constitutional:      Appearance: She is well-developed.  HENT:     Head: Normocephalic and atraumatic.     Right Ear: External ear normal.     Left Ear: External ear normal.     Nose: Nose normal.  Eyes:     Conjunctiva/sclera: Conjunctivae normal.     Pupils: Pupils are equal, round, and reactive to light.  Neck:     Thyroid: No thyromegaly.  Cardiovascular:     Rate and Rhythm: Normal rate and regular rhythm.     Heart sounds: Normal heart sounds.  Pulmonary:     Effort: Pulmonary effort is normal.     Breath sounds: Normal breath sounds. No wheezing.  Musculoskeletal:     Cervical back: Neck supple.  Lymphadenopathy:     Cervical: No cervical adenopathy.  Skin:    General: Skin is warm and dry.  Neurological:     Mental Status: She is alert and oriented to person, place, and time.     No results found for any visits on 11/09/22.      Assessment & Plan:   Problem List Items Addressed This Visit   None Visit Diagnoses     Acute non-recurrent sinusitis, unspecified location    -  Primary   Relevant Medications   amoxicillin-clavulanate (AUGMENTIN) 875-125 MG tablet   fluconazole (DIFLUCAN) 150 MG tablet      Acute sinusitis-we will treat  with Augmentin.  She tends to get yeast infections as well as a send over Diflucan.  If not better in 1 week then please let us know.  Pressure is a little elevated but she has a regular follow-up on Monday.  Will recheck at that time.  Meds ordered this encounter  Medications   amoxicillin-clavulanate (AUGMENTIN) 875-125 MG tablet    Sig: Take 1 tablet by mouth 2 (two) times daily.    Dispense:  14 tablet    Refill:  0   fluconazole (DIFLUCAN) 150 MG tablet    Sig: Take 1 tablet (150 mg total) by mouth once for 1 dose.    Dispense:  2 tablet    Refill:  0    No follow-ups on file.  Beatrice Lecher, MD

## 2022-11-11 ENCOUNTER — Other Ambulatory Visit: Payer: Self-pay | Admitting: Family Medicine

## 2022-11-11 DIAGNOSIS — F902 Attention-deficit hyperactivity disorder, combined type: Secondary | ICD-10-CM

## 2022-11-12 ENCOUNTER — Ambulatory Visit (INDEPENDENT_AMBULATORY_CARE_PROVIDER_SITE_OTHER): Payer: 59 | Admitting: Family Medicine

## 2022-11-12 ENCOUNTER — Encounter: Payer: Self-pay | Admitting: Family Medicine

## 2022-11-12 VITALS — BP 130/69 | HR 77 | Ht 65.0 in | Wt 271.0 lb

## 2022-11-12 DIAGNOSIS — E282 Polycystic ovarian syndrome: Secondary | ICD-10-CM | POA: Diagnosis not present

## 2022-11-12 DIAGNOSIS — R7303 Prediabetes: Secondary | ICD-10-CM

## 2022-11-12 DIAGNOSIS — Z6841 Body Mass Index (BMI) 40.0 and over, adult: Secondary | ICD-10-CM | POA: Diagnosis not present

## 2022-11-12 DIAGNOSIS — E88819 Insulin resistance, unspecified: Secondary | ICD-10-CM | POA: Diagnosis not present

## 2022-11-12 MED ORDER — AMPHETAMINE-DEXTROAMPHET ER 20 MG PO CP24: 20.0000 mg | ORAL_CAPSULE | ORAL | 0 refills | Status: DC

## 2022-11-12 MED ORDER — ZEPBOUND 2.5 MG/0.5ML ~~LOC~~ SOAJ: 2.5000 mg | SUBCUTANEOUS | 0 refills | Status: DC

## 2022-11-12 MED ORDER — ZEPBOUND 5 MG/0.5ML ~~LOC~~ SOAJ: 5.0000 mg | SUBCUTANEOUS | 0 refills | Status: DC

## 2022-11-12 NOTE — Assessment & Plan Note (Addendum)
Visit #: 1 Starting Weight: 271 lbs   Current weight: 271 lbs  Previous weight: Change in weight: Goal weight:  Dietary goals: Continue to work on lots of vegetables and reducing sweets. Exercise goals: Continue swimming weekly. Medication: Start Zepbound.  Follow-up and referrals: 4-6 weeks.

## 2022-11-12 NOTE — Progress Notes (Signed)
Established Patient Office Visit  Subjective   Patient ID: Barbara Mcmahon, female    DOB: Dec 06, 1975  Age: 47 y.o. MRN: [id number]  Chief Complaint  Patient presents with   Follow-up         HPI  Would like to discuss her PCOS.  She is really struggling with her PCOS sxs.  Initially after gastric sleeve she had a lot of improvement in her symptoms such as irregular periods increased cravings difficulty losing weight.  But more recently she seems to have plateaued and if anything she is really noticed that her food cravings have come back pretty aggressively.  She has had a lot more bloating and GI discomfort.  She says she took metformin for years but it caused significant diarrhea and she finally just got to the point with traveling with her job that she was unable to tolerate it anymore.  She would like to consider a GLP-1 she has been doing some reading about the benefits for insulin resistance and PCOS as well as weight management.  She is really try to stay on track with the healthy lifestyle changes that she is already made.  She has ajusted her caloric intake, water exercise.   Follow-up from recent sinusitis she is still on antibiotics and is feeling better.    ROS    Objective:     BP 130/69   Pulse 77   Ht '5\' 5"'$  (1.651 m)   Wt 271 lb (122.9 kg)   SpO2 100%   BMI 45.10 kg/m    Physical Exam Vitals and nursing note reviewed.  Constitutional:      Appearance: She is well-developed.  HENT:     Head: Normocephalic and atraumatic.  Cardiovascular:     Rate and Rhythm: Normal rate and regular rhythm.     Heart sounds: Normal heart sounds.  Pulmonary:     Effort: Pulmonary effort is normal.     Breath sounds: Normal breath sounds.  Skin:    General: Skin is warm and dry.  Neurological:     Mental Status: She is alert and oriented to person, place, and time.  Psychiatric:        Behavior: Behavior normal.      No results found for any visits on  11/12/22.    The 10-year ASCVD risk score (Arnett DK, et al., 2019) is: 1.2%    Assessment & Plan:   Problem List Items Addressed This Visit       Endocrine   PCOS (polycystic ovarian syndrome) (Chronic)    Did discuss potential options I do think a GLP-1 would be great to improve her PCOS as well as her insulin resistance and to improve her weight management.  We discussed current options on the market it looks like that bowel may be covered on her insurance with a prior authorization.  Coupon card provided.  I would like to see if this helps control some of her symptoms since she is intolerant to metformin.      Relevant Medications   tirzepatide (ZEPBOUND) 2.5 MG/0.5ML Pen   tirzepatide (ZEPBOUND) 5 MG/0.5ML Pen (Start on 12/08/2022)   Insulin resistance    Visit #: 1 Starting Weight: 271 lbs   Current weight: 271 lbs  Previous weight: Change in weight: Goal weight:  Dietary goals: Continue to work on lots of vegetables and reducing sweets. Exercise goals: Continue swimming weekly. Medication: Start Zepbound.  Follow-up and referrals: 4-6 weeks.  Relevant Medications   tirzepatide (ZEPBOUND) 2.5 MG/0.5ML Pen   tirzepatide (ZEPBOUND) 5 MG/0.5ML Pen (Start on 12/08/2022)     Other   Prediabetes - Primary   Other Visit Diagnoses     BMI 45.0-49.9, adult ([place])       Relevant Medications   tirzepatide (ZEPBOUND) 2.5 MG/0.5ML Pen   tirzepatide (ZEPBOUND) 5 MG/0.5ML Pen (Start on 12/08/2022)       Return in about 5 weeks (around 12/17/2022) for Prediabetes, New start medication.    Beatrice Lecher, MD

## 2022-11-12 NOTE — Assessment & Plan Note (Signed)
Did discuss potential options I do think a GLP-1 would be great to improve her PCOS as well as her insulin resistance and to improve her weight management.  We discussed current options on the market it looks like that bowel may be covered on her insurance with a prior authorization.  Coupon card provided.  I would like to see if this helps control some of her symptoms since she is intolerant to metformin.

## 2022-11-13 ENCOUNTER — Other Ambulatory Visit: Payer: Self-pay | Admitting: Hematology & Oncology

## 2022-11-13 DIAGNOSIS — R0602 Shortness of breath: Secondary | ICD-10-CM

## 2022-11-16 ENCOUNTER — Encounter: Payer: Self-pay | Admitting: Dietician

## 2022-11-16 ENCOUNTER — Encounter: Payer: 59 | Attending: General Surgery | Admitting: Dietician

## 2022-11-16 ENCOUNTER — Encounter: Payer: Self-pay | Admitting: Family Medicine

## 2022-11-16 DIAGNOSIS — E669 Obesity, unspecified: Secondary | ICD-10-CM | POA: Insufficient documentation

## 2022-11-16 NOTE — Progress Notes (Signed)
Bariatric Nutrition Follow-Up Visit Medical Nutrition Therapy   Surgery date: 05/22/2021 Surgery type: sleeve  NUTRITION ASSESSMENT  Start weight at [facility]: 311.2 Height: 65 in Weight today: pt declined today   Body Composition Scale 06/06/2021 07/19/2021 10/23/2021 01/23/2022 05/24/2022 08/17/2022  Current Body Weight 292.2 277.2 253.6 251.3 259.6 262.2  Total Body Fat % 48.2 47.1 45 44.8 45.7 45.9  Visceral Fat 19 18 15 15 16 16  $ Fat-Free Mass % 51.7 52.8 54.9 55.1 54.2 54.0   Total Body Water % 40.3 40.9 41.9 42 41.6 41.5  Muscle-Mass lbs 32.9 32.8 32.5 32.5 32.5 32.5  BMI 48.3 45.8 41.9 41.5 42.9 43.3  Body Fat Displacement               Torso  lbs 87.5 81 70.8 69.8 73.5 74.7         Left Leg  lbs 17.5 16.2 14.1 13.9 14.7 14.9         Right Leg  lbs 17.5 16.2 14.1 13.9 14.7 14.9         Left Arm  lbs 8.7 8.1 7.0 6.9 7.3 7.4         Right Arm   lbs 8.7 8.1 7.0 6.9 7.3 7.4   Clinical  Medical hx: prediabetes, Anxiety, arthritis, seizures, colon polyps, IBS, PCOS, 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 Vitamin D Allergies: nutmeg oil, shellfish, shrimp, yeast, Cinnamon   Lifestyle & Dietary Hx  Pt states she has been traveling a lot lately, stating she had a sinus infection while out of town she is recovering from. Pt states she is doing water exercises at the [facility] once a week and her children can swim and play while she is there. Pt states she is not tense in her back if she is working out, also stating she is not as stressed either. Pt states she is working with her PCP to get [facility] or other GLP-1 medication, due to PCOS and insulin resistance. Pt states she and a friend have done vision boards and the center of hers and the friend's board is "water", stating she is getting well over 64 ounces, as her focus. Pt states she allows herself one diet coke a  day, stating she cut out coffee to avoid hand tremors. Pt doing great and very positive.  Estimated daily fluid intake: less than 64 oz Estimated daily protein intake: 75+ g Supplements: procare multi (does not take the calcium supplement due to heavy dairy servings) Current average weekly physical activity: every Wednesday (strength/cardio); at least 3 days week, including water physical activity  24-Hr Dietary Recall First Meal: quest protein bar, lowfat cheese stick, greek yogurt. Snack: chocolate humus with strawberries Second Meal: low carb wrap with spinach, tomato and meat, carrots lunch meat, with cheese, harvest snaps Snack: harvest snap (less than the serving) or fruit Third Meal: chicken Tzatziki sauce (homemade) + veggie or black bean burger and salad Snack: yogurt or cheese after gone to sleep Beverages: vitamin water zero, water, diet coke  Post-Op Goals/ Signs/ Symptoms Using straws: no Drinking while eating: no Chewing/swallowing difficulties: no Changes in vision: no Changes to mood/headaches: no Hair loss/changes to skin/nails: no Difficulty focusing/concentrating: no Sweating: no Dizziness/lightheadedness: no Palpitations: no  Carbonated/caffeinated beverages: no N/V/D/C/Gas: no Abdominal pain: no Dumping syndrome: no    NUTRITION DIAGNOSIS  Overweight/obesity ([place]-3.3) related to past poor dietary habits and  physical inactivity as evidenced by completed bariatric surgery and following dietary guidelines for continued weight loss and healthy nutrition status.     NUTRITION INTERVENTION Nutrition counseling (C-1) and education (E-2) to facilitate bariatric surgery goals, including:  Educated pt on emotional eating and identifying when it is occurring and what to replace the food with ie walking or playing with her dog The importance of consuming adequate calories as well as certain nutrients daily due to the body's need for essential vitamins, minerals, and  fats The importance of daily physical activity and to reach a goal of at least 150 minutes of moderate to vigorous physical activity weekly (or as directed by their physician) due to benefits such as increased musculature and improved lab values The importance of intuitive eating specifically learning hunger-satiety cues and understanding the importance of learning a new body: The importance of mindful eating to avoid grazing behaviors Encouraged pt to continue to drink a minium 64 fluid ounces with half being plain water to satisfy proper hydration   Goals: -For stress relief to avoid emotional eating: Butterfly garden or pulling weeds or walking with knee wrapped  -Continue: non-starchy vegetables -Continue: 64 oz of fluid, and excuse self from conference calls for restroom breaks -Continue: at least 50 grams of complex carbohydrates -Continue: physical activity (Wednesday class and water exercises at the [facility]) -Continue: keep water bottle/container with you to help sipping between meals.   Handouts Provided Include  Health Benefits of Physical Activity Bariatric MyPlate  Learning Style & Readiness for Change Teaching method utilized: Visual & Auditory  Demonstrated degree of understanding via: Teach Back  Readiness Level: action Barriers to learning/adherence to lifestyle change: none  RD's Notes for Next Visit Assess adherence to pt chosen goals  Status of GLP-1 medication   MONITORING & EVALUATION Dietary intake, weekly physical activity, body weight  Next Steps Patient is to follow-up in 6 months.

## 2022-11-20 ENCOUNTER — Telehealth: Payer: Self-pay

## 2022-11-20 NOTE — Telephone Encounter (Signed)
Initiated Prior authorization [id number] 2.5MG/0.5ML pen-injectors  Via: Covermymeds Case/Key:[id number] Status: approved as of 11/20/22 Reason:Coverage Start Date:10/21/2022;Coverage End Date:07/18/2023; Notified Pt via: Mychart

## 2022-11-26 ENCOUNTER — Ambulatory Visit (INDEPENDENT_AMBULATORY_CARE_PROVIDER_SITE_OTHER): Payer: 59 | Admitting: Family Medicine

## 2022-11-26 ENCOUNTER — Encounter: Payer: Self-pay | Admitting: Family Medicine

## 2022-11-26 VITALS — BP 137/69 | HR 98 | Ht 65.0 in | Wt 283.1 lb

## 2022-11-26 DIAGNOSIS — R040 Epistaxis: Secondary | ICD-10-CM

## 2022-11-26 DIAGNOSIS — J019 Acute sinusitis, unspecified: Secondary | ICD-10-CM | POA: Diagnosis not present

## 2022-11-26 MED ORDER — MUPIROCIN 2 % EX OINT: TOPICAL_OINTMENT | Freq: Two times a day (BID) | CUTANEOUS | 0 refills | Status: DC

## 2022-11-26 MED ORDER — FLUCONAZOLE 150 MG PO TABS: 150.0000 mg | ORAL_TABLET | Freq: Once | ORAL | 0 refills | Status: AC

## 2022-11-26 MED ORDER — CEFDINIR 300 MG PO CAPS: 300.0000 mg | ORAL_CAPSULE | Freq: Two times a day (BID) | ORAL | 0 refills | Status: DC

## 2022-11-26 NOTE — Progress Notes (Signed)
Acute Office Visit  Subjective:     Patient ID: Barbara Mcmahon, female    DOB: 1975/11/15, 47 y.o.   MRN: [id number]  Chief Complaint  Patient presents with   Sinus Problem    HPI Patient is in today for blood in the right nostril.  I saw her little over 2 weeks ago for sinusitis.  She was evaluated for sinusitis on February 2 and treated with Augmentin.  Did start to feel better but had to fly again last week and then started to feel worse again.  All of the congestion she feels like is in her nose but she almost feels like something is blocked on that right side she has been blowing out blood in mucousy scabs.  No fever or chills.  Chest is okay.  She still been using her nasal saline and the Flonase.  ROS      Objective:    BP 137/69 (BP Location: Left Wrist, Patient Position: Sitting, Cuff Size: Normal)   Pulse 98   Ht 5' 5"$  (1.651 m)   Wt 283 lb 1.9 oz (128.4 kg)   SpO2 99%   BMI 47.11 kg/m    Physical Exam Constitutional:      Appearance: She is well-developed.  HENT:     Head: Normocephalic and atraumatic.     Right Ear: External ear normal.     Left Ear: External ear normal.     Nose: Nose normal.  Eyes:     Conjunctiva/sclera: Conjunctivae normal.     Pupils: Pupils are equal, round, and reactive to light.  Neck:     Thyroid: No thyromegaly.  Cardiovascular:     Rate and Rhythm: Normal rate and regular rhythm.     Heart sounds: Normal heart sounds.  Pulmonary:     Effort: Pulmonary effort is normal.     Breath sounds: Normal breath sounds. No wheezing.  Musculoskeletal:     Cervical back: Neck supple.  Lymphadenopathy:     Cervical: No cervical adenopathy.  Skin:    General: Skin is warm and dry.  Neurological:     Mental Status: She is alert and oriented to person, place, and time.     No results found for any visits on 11/26/22.      Assessment & Plan:   Problem List Items Addressed This Visit   None Visit Diagnoses     Acute  non-recurrent sinusitis, unspecified location    -  Primary   Relevant Medications   cefdinir (OMNICEF) 300 MG capsule   fluconazole (DIFLUCAN) 150 MG tablet   Bleeding from the nose          Will treat with second round of antibiotics of cefdinir.  Also send over Diflucan for as needed she does tend to get yeast infections with antibiotics.  I would have her stop the Flonase for a few days to give the nasal septum a break and apply mupirocin ointment to see if we can get the bleeding to slow down.  Avoid blowing the nose forcefully.  Okay to continue with nasal saline twice a day.  Meds ordered this encounter  Medications   cefdinir (OMNICEF) 300 MG capsule    Sig: Take 1 capsule (300 mg total) by mouth 2 (two) times daily.    Dispense:  14 capsule    Refill:  0   fluconazole (DIFLUCAN) 150 MG tablet    Sig: Take 1 tablet (150 mg total) by mouth once for 1 dose.  Dispense:  2 tablet    Refill:  0   mupirocin ointment (BACTROBAN) 2 %    Sig: Apply topically 2 (two) times daily. Apply to inside of each nares    Dispense:  30 g    Refill:  0    No follow-ups on file.  Beatrice Lecher, MD

## 2022-12-13 ENCOUNTER — Ambulatory Visit (INDEPENDENT_AMBULATORY_CARE_PROVIDER_SITE_OTHER): Payer: 59 | Admitting: Family Medicine

## 2022-12-13 ENCOUNTER — Encounter: Payer: Self-pay | Admitting: Family Medicine

## 2022-12-13 VITALS — BP 122/86 | HR 93 | Ht 65.0 in | Wt 285.1 lb

## 2022-12-13 DIAGNOSIS — F902 Attention-deficit hyperactivity disorder, combined type: Secondary | ICD-10-CM | POA: Diagnosis not present

## 2022-12-13 DIAGNOSIS — E88819 Insulin resistance, unspecified: Secondary | ICD-10-CM

## 2022-12-13 LAB — POCT GLYCOSYLATED HEMOGLOBIN (HGB A1C): Hemoglobin A1C: 6.1 % — AB (ref 4.0–5.6)

## 2022-12-13 NOTE — Assessment & Plan Note (Signed)
Well controlled. Continue current regimen. Follow up in  6 mo  

## 2022-12-13 NOTE — Progress Notes (Addendum)
   Established Patient Office Visit  Subjective   Patient ID: Barbara Mcmahon, female    DOB: 1976-05-30  Age: 47 y.o. MRN: [id number]  Chief Complaint  Patient presents with   Follow-up    HPI ADD - Reports symptoms are well controlled on current regime. Denies any problems with insomnia, chest pain, palpitations, or SOB.    F/U Wt Mgt for PCOS and IFG.  Currently on Zepbound 5 mg dose. She just started it a week ago but she was sick and wanted to wait until she was feeling better from recent sinusitis.      ROS    Objective:     BP 122/86 (BP Location: Left Wrist, Patient Position: Sitting, Cuff Size: Normal)   Pulse 93   Ht '5\' 5"'$  (1.651 m)   Wt 285 lb 1.9 oz (129.3 kg)   SpO2 100%   BMI 47.45 kg/m    Physical Exam Vitals and nursing note reviewed.  Constitutional:      Appearance: She is well-developed.  HENT:     Head: Normocephalic and atraumatic.  Cardiovascular:     Rate and Rhythm: Normal rate and regular rhythm.     Heart sounds: Normal heart sounds.  Pulmonary:     Effort: Pulmonary effort is normal.     Breath sounds: Normal breath sounds.  Skin:    General: Skin is warm and dry.  Neurological:     Mental Status: She is alert and oriented to person, place, and time.  Psychiatric:        Behavior: Behavior normal.      Results for orders placed or performed in visit on 12/13/22  POCT glycosylated hemoglobin (Hb A1C)  Result Value Ref Range   Hemoglobin A1C 6.1 (A) 4.0 - 5.6 %   HbA1c POC (<> result, manual entry)     HbA1c, POC (prediabetic range)     HbA1c, POC (controlled diabetic range)        The 10-year ASCVD risk score (Arnett DK, et al., 2019) is: 1.1%    Assessment & Plan:   Problem List Items Addressed This Visit       Endocrine   Insulin resistance    A1C is 6.1 today.    Visit #: 2  Starting Weight: 285 lbs    Current weight: 285 lbs Previous weight: 283 lbs  Change in weight form last OV:  Goal weight:   Dietary goals: Continue to work on lots of protein, vegetables and reducing sweets.  Need to make adjustments to what she is eating to see what is triggering more nausea versus other foods. Exercise goals: Ending on starting back with swimming this week.  And interested in maybe even doing a pole class.  Medication: On  Zepbound 2.'5mg'$ . Increase dose to '5mg'$  at the end of the month if doing well. Follow-up and referrals: 6 weeks      Relevant Orders   POCT glycosylated hemoglobin (Hb A1C) (Completed)     Other   Attention deficit hyperactivity disorder (ADHD), combined type - Primary    Well controlled. Continue current regimen. Follow up in  6 mo        Return in about 6 weeks (around 01/24/2023) for Prediabetes and Zepbound. Beatrice Lecher, MD

## 2022-12-13 NOTE — Assessment & Plan Note (Addendum)
A1C is 6.1 today.    Visit #: 2  Starting Weight: 285 lbs    Current weight: 285 lbs Previous weight: 283 lbs  Change in weight form last OV:  Goal weight:  Dietary goals: Continue to work on lots of protein, vegetables and reducing sweets.  Need to make adjustments to what she is eating to see what is triggering more nausea versus other foods. Exercise goals: Ending on starting back with swimming this week.  And interested in maybe even doing a pole class.  Medication: On  Zepbound 2.'5mg'$ . Increase dose to '5mg'$  at the end of the month if doing well. Follow-up and referrals: 6 weeks

## 2022-12-16 ENCOUNTER — Other Ambulatory Visit: Payer: Self-pay | Admitting: Family Medicine

## 2022-12-16 DIAGNOSIS — F902 Attention-deficit hyperactivity disorder, combined type: Secondary | ICD-10-CM

## 2022-12-17 ENCOUNTER — Ambulatory Visit: Payer: 59 | Admitting: Family Medicine

## 2022-12-17 MED ORDER — AMPHETAMINE-DEXTROAMPHETAMINE 10 MG PO TABS: 10.0000 mg | ORAL_TABLET | Freq: Every day | ORAL | 0 refills | Status: DC | PRN

## 2022-12-17 MED ORDER — AMPHETAMINE-DEXTROAMPHET ER 20 MG PO CP24: 20.0000 mg | ORAL_CAPSULE | ORAL | 0 refills | Status: DC

## 2023-01-02 ENCOUNTER — Other Ambulatory Visit: Payer: Self-pay | Admitting: *Deleted

## 2023-01-02 DIAGNOSIS — G4452 New daily persistent headache (NDPH): Secondary | ICD-10-CM

## 2023-01-02 MED ORDER — AMITRIPTYLINE HCL 25 MG PO TABS: 25.0000 mg | ORAL_TABLET | Freq: Every day | ORAL | 1 refills | Status: DC

## 2023-01-08 ENCOUNTER — Encounter: Payer: Self-pay | Admitting: *Deleted

## 2023-01-21 ENCOUNTER — Encounter: Payer: Self-pay | Admitting: *Deleted

## 2023-01-25 ENCOUNTER — Ambulatory Visit: Payer: 59 | Admitting: Family Medicine

## 2023-01-27 ENCOUNTER — Other Ambulatory Visit: Payer: Self-pay | Admitting: Family Medicine

## 2023-01-27 DIAGNOSIS — F902 Attention-deficit hyperactivity disorder, combined type: Secondary | ICD-10-CM

## 2023-01-28 MED ORDER — AMPHETAMINE-DEXTROAMPHET ER 20 MG PO CP24: 20.0000 mg | ORAL_CAPSULE | ORAL | 0 refills | Status: DC

## 2023-01-29 ENCOUNTER — Telehealth: Payer: Self-pay | Admitting: Family Medicine

## 2023-01-30 NOTE — Telephone Encounter (Signed)
Is call patient in regards to message below.  I sent MyChart yesterday but it looks like she has not responded.  And I do not want to cause any delays in her getting her medication.

## 2023-01-30 NOTE — Telephone Encounter (Signed)
Left a message for a return call or respond through MyChart.

## 2023-01-31 ENCOUNTER — Encounter: Payer: Self-pay | Admitting: Family Medicine

## 2023-01-31 MED ORDER — ZEPBOUND 7.5 MG/0.5ML ~~LOC~~ SOAJ: 7.5000 mg | SUBCUTANEOUS | 0 refills | Status: DC

## 2023-01-31 NOTE — Telephone Encounter (Signed)
Meds ordered this encounter  Medications   tirzepatide (ZEPBOUND) 7.5 MG/0.5ML Pen    Sig: Inject 7.5 mg into the skin once a week.    Dispense:  2 mL    Refill:  0

## 2023-02-12 DIAGNOSIS — G43909 Migraine, unspecified, not intractable, without status migrainosus: Secondary | ICD-10-CM | POA: Insufficient documentation

## 2023-02-12 LAB — HM MAMMOGRAPHY

## 2023-02-12 MED ORDER — ZEPBOUND 7.5 MG/0.5ML ~~LOC~~ SOAJ: 7.5000 mg | SUBCUTANEOUS | 0 refills | Status: DC

## 2023-02-14 ENCOUNTER — Other Ambulatory Visit (INDEPENDENT_AMBULATORY_CARE_PROVIDER_SITE_OTHER): Payer: 59

## 2023-02-14 ENCOUNTER — Ambulatory Visit (INDEPENDENT_AMBULATORY_CARE_PROVIDER_SITE_OTHER): Payer: 59 | Admitting: Sports Medicine

## 2023-02-14 ENCOUNTER — Other Ambulatory Visit: Payer: Self-pay | Admitting: Obstetrics and Gynecology

## 2023-02-14 DIAGNOSIS — M533 Sacrococcygeal disorders, not elsewhere classified: Secondary | ICD-10-CM | POA: Diagnosis not present

## 2023-02-14 DIAGNOSIS — Z9189 Other specified personal risk factors, not elsewhere classified: Secondary | ICD-10-CM

## 2023-02-14 MED ORDER — HYDROCODONE-ACETAMINOPHEN 10-325 MG PO TABS: 0.5000 | ORAL_TABLET | Freq: Three times a day (TID) | ORAL | 0 refills | Status: DC | PRN

## 2023-02-14 NOTE — Assessment & Plan Note (Addendum)
Barbara Mcmahon returns, she has chronic multifactorial low back pain, MRI did show some facet arthritis but she did not respond to multilevel facet injections, she has however responded well to left SI joint injections the last of which was done in 2019. Today she has a recurrence of pain left SI joint, buttock with radiation down to the left thigh, she does endorse some discomfort past the knee. Repeated left sacroiliac injection with ultrasound guidance. I did inform her that the injection would unlikely improve pain past the knee but pain left back, buttock and thigh would likely resolve. After the injection she did report relief of all pain, return to see me as needed in 6 weeks. If insufficient improvement we will likely need a new MRI.

## 2023-02-14 NOTE — Addendum Note (Signed)
Addended by: Monica Becton on: 02/14/2023 02:04 PM   Modules accepted: Orders

## 2023-02-14 NOTE — Progress Notes (Signed)
    Procedures performed today:    Procedure: Real-time Ultrasound Guided injection of the left sacroiliac joint Device: Samsung HS60  Verbal informed consent obtained.  Time-out conducted.  Noted no overlying erythema, induration, or other signs of local infection.  Skin prepped in a sterile fashion.  Local anesthesia: Topical Ethyl chloride.  With sterile technique and under real time ultrasound guidance: Arthritic joint noted, 1 cc Kenalog 40, 2 cc lidocaine, 2 cc bupivacaine injected easily Completed without difficulty  Advised to call if fevers/chills, erythema, induration, drainage, or persistent bleeding.  Images permanently stored and available for review in PACS.  Impression: Technically successful ultrasound guided injection.  Independent interpretation of notes and tests performed by another provider:   None.  Brief History, Exam, Impression, and Recommendations:    SI (sacroiliac) joint dysfunction Joquita returns, she has chronic multifactorial low back pain, MRI did show some facet arthritis but she did not respond to multilevel facet injections, she has however responded well to left SI joint injections the last of which was done in 2019. Today she has a recurrence of pain left SI joint, buttock with radiation down to the left thigh, she does endorse some discomfort past the knee. Repeated left sacroiliac injection with ultrasound guidance. I did inform her that the injection would unlikely improve pain past the knee but pain left back, buttock and thigh would likely resolve. After the injection she did report relief of all pain, return to see me as needed in 6 weeks. If insufficient improvement we will likely need a new MRI.    ____________________________________________ Barbara Mcmahon. Benjamin Stain, M.D., ABFM., CAQSM., AME. Primary Care and Sports Medicine [facility]  Adjunct Professor of Family Medicine  [employer]  Restaurant manager, fast food

## 2023-02-15 ENCOUNTER — Encounter: Payer: Self-pay | Admitting: Sports Medicine

## 2023-02-17 IMAGING — DX DG KNEE COMPLETE 4+V*L*
4 series · 4 of 4 positions shown · non-contrast
Comparison: None.

CLINICAL DATA: Status post left knee meniscal repair and
debridement now with medial left knee pain

EXAM:
RIGHT KNEE - 1-2 VIEW; LEFT KNEE - COMPLETE 4+ VIEW

[knee lat]
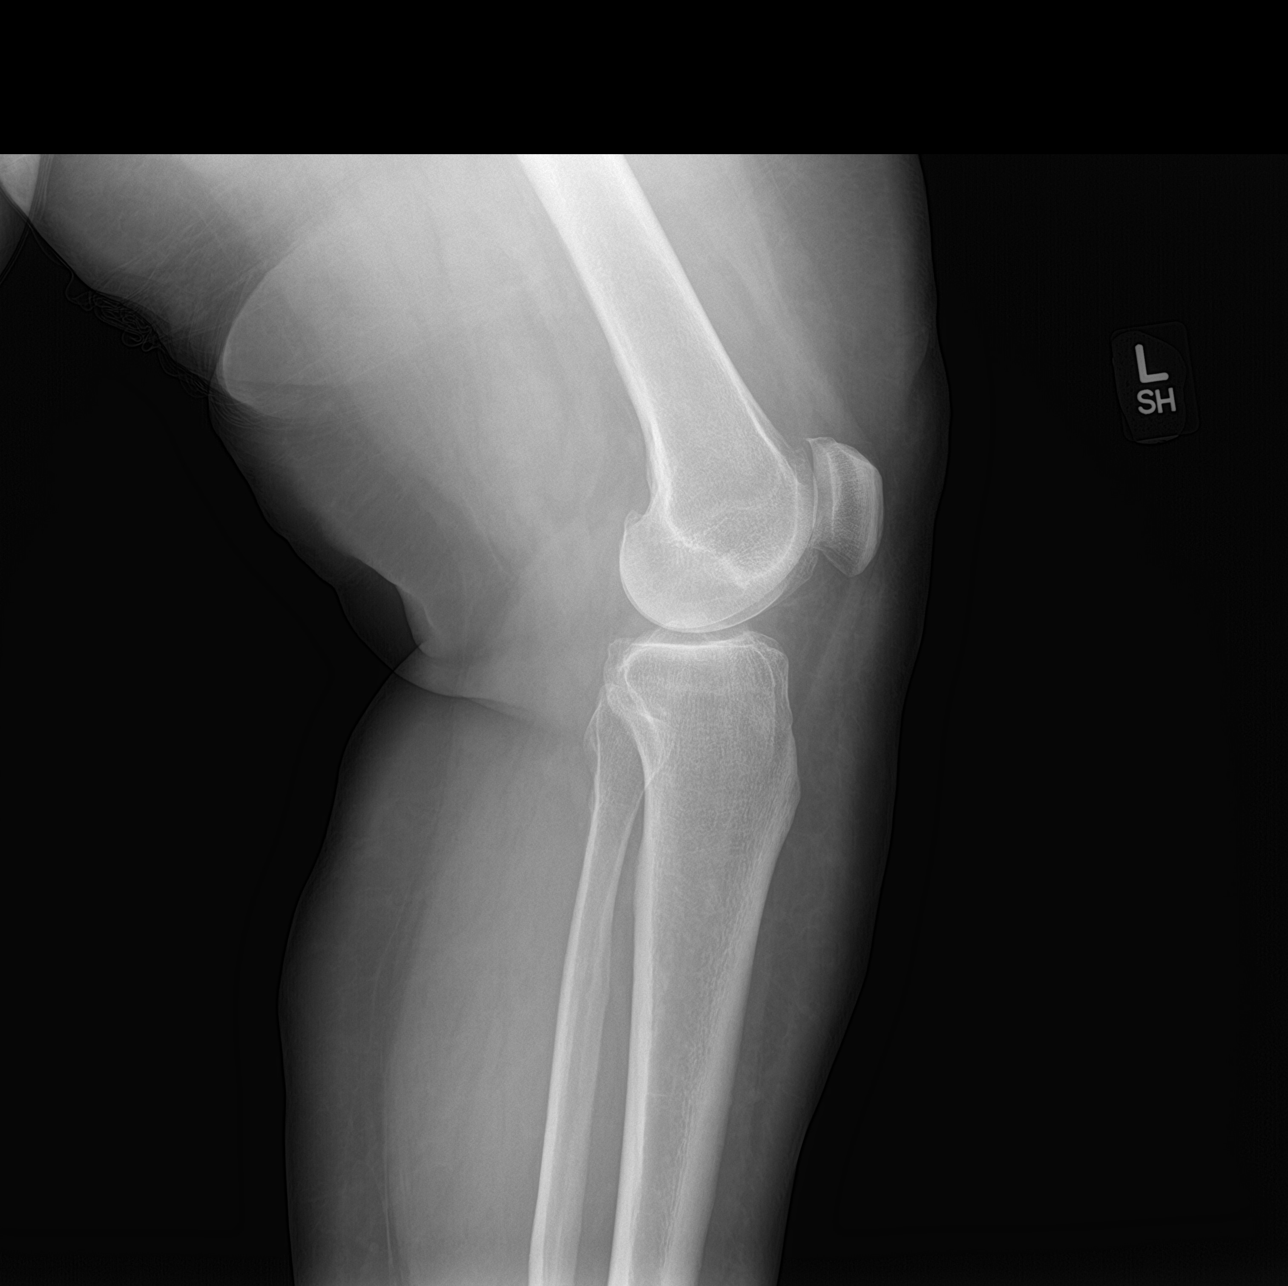

[knee sunrise]
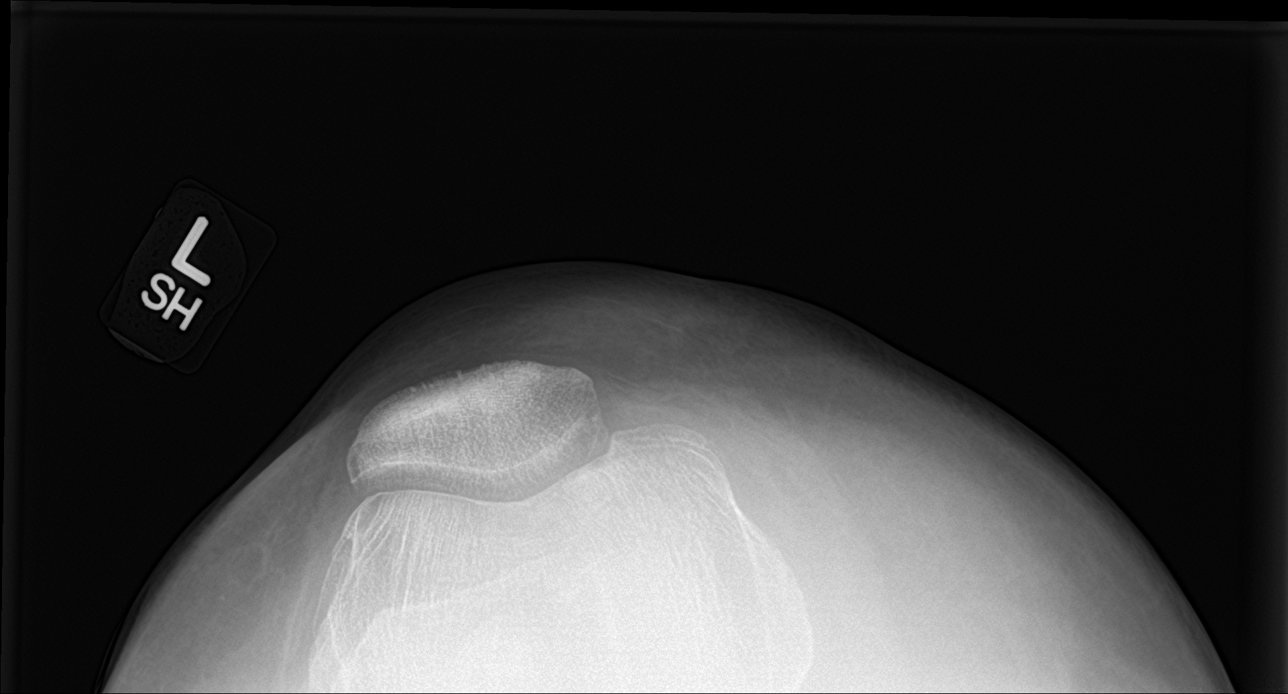

[knee ap bilat standing (1 of 2)]
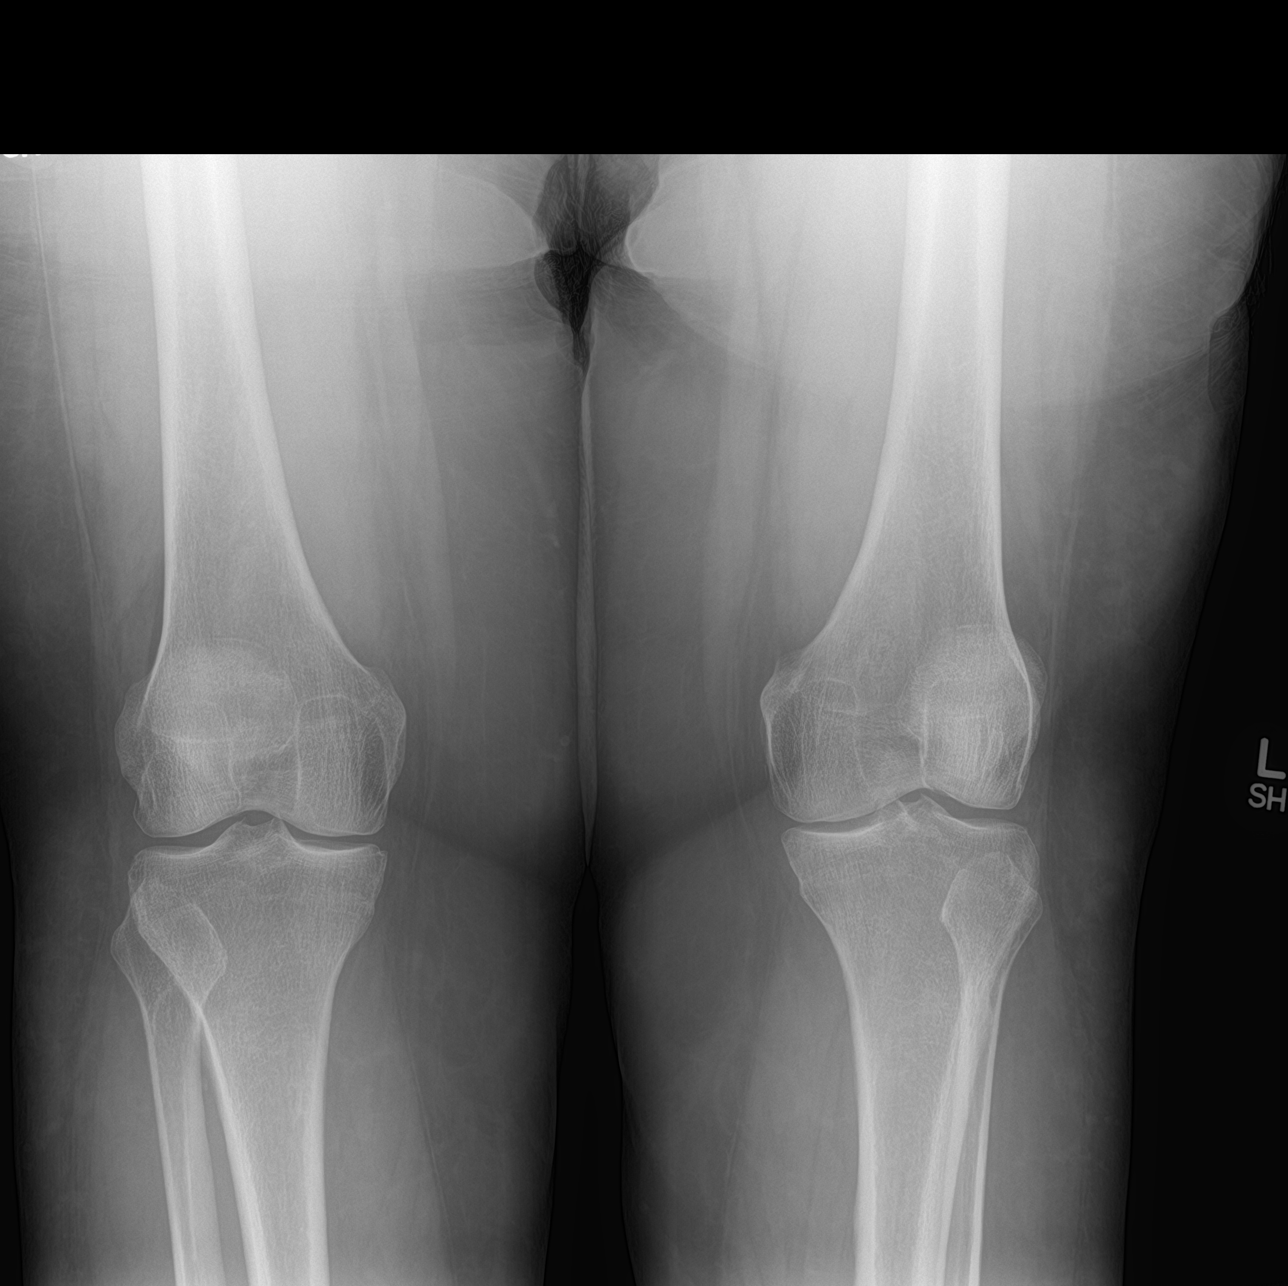

[knee ap bilat standing (2 of 2)]
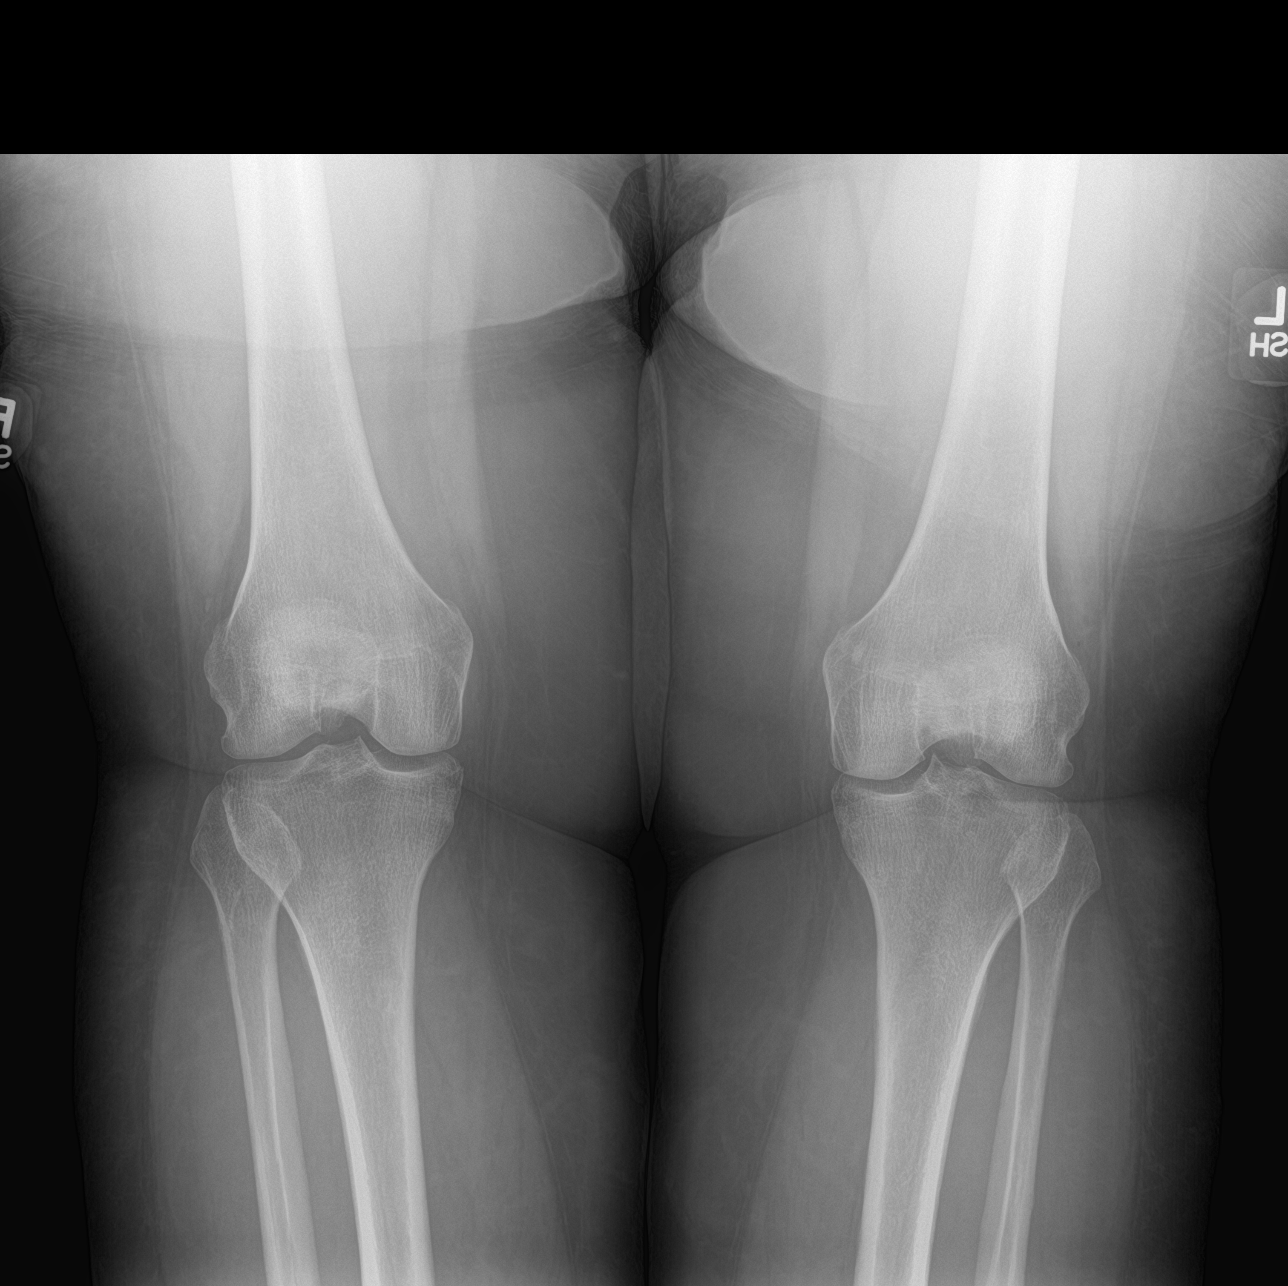

[4 of 4 positions shown; findings below may reference images not displayed]

FINDINGS: No evidence of fracture, dislocation, or joint effusion. No evidence
of arthropathy or other focal bone abnormality. Soft tissues are
unremarkable. Limited evaluation of the right knee is unremarkable.
IMPRESSION: Negative.

## 2023-02-17 IMAGING — DX DG KNEE 1-2V*R*
2 series · 2 of 2 positions shown · non-contrast
Comparison: None.

CLINICAL DATA: Status post left knee meniscal repair and
debridement now with medial left knee pain

EXAM:
RIGHT KNEE - 1-2 VIEW; LEFT KNEE - COMPLETE 4+ VIEW

[knee ap bilat standing (1 of 2)]
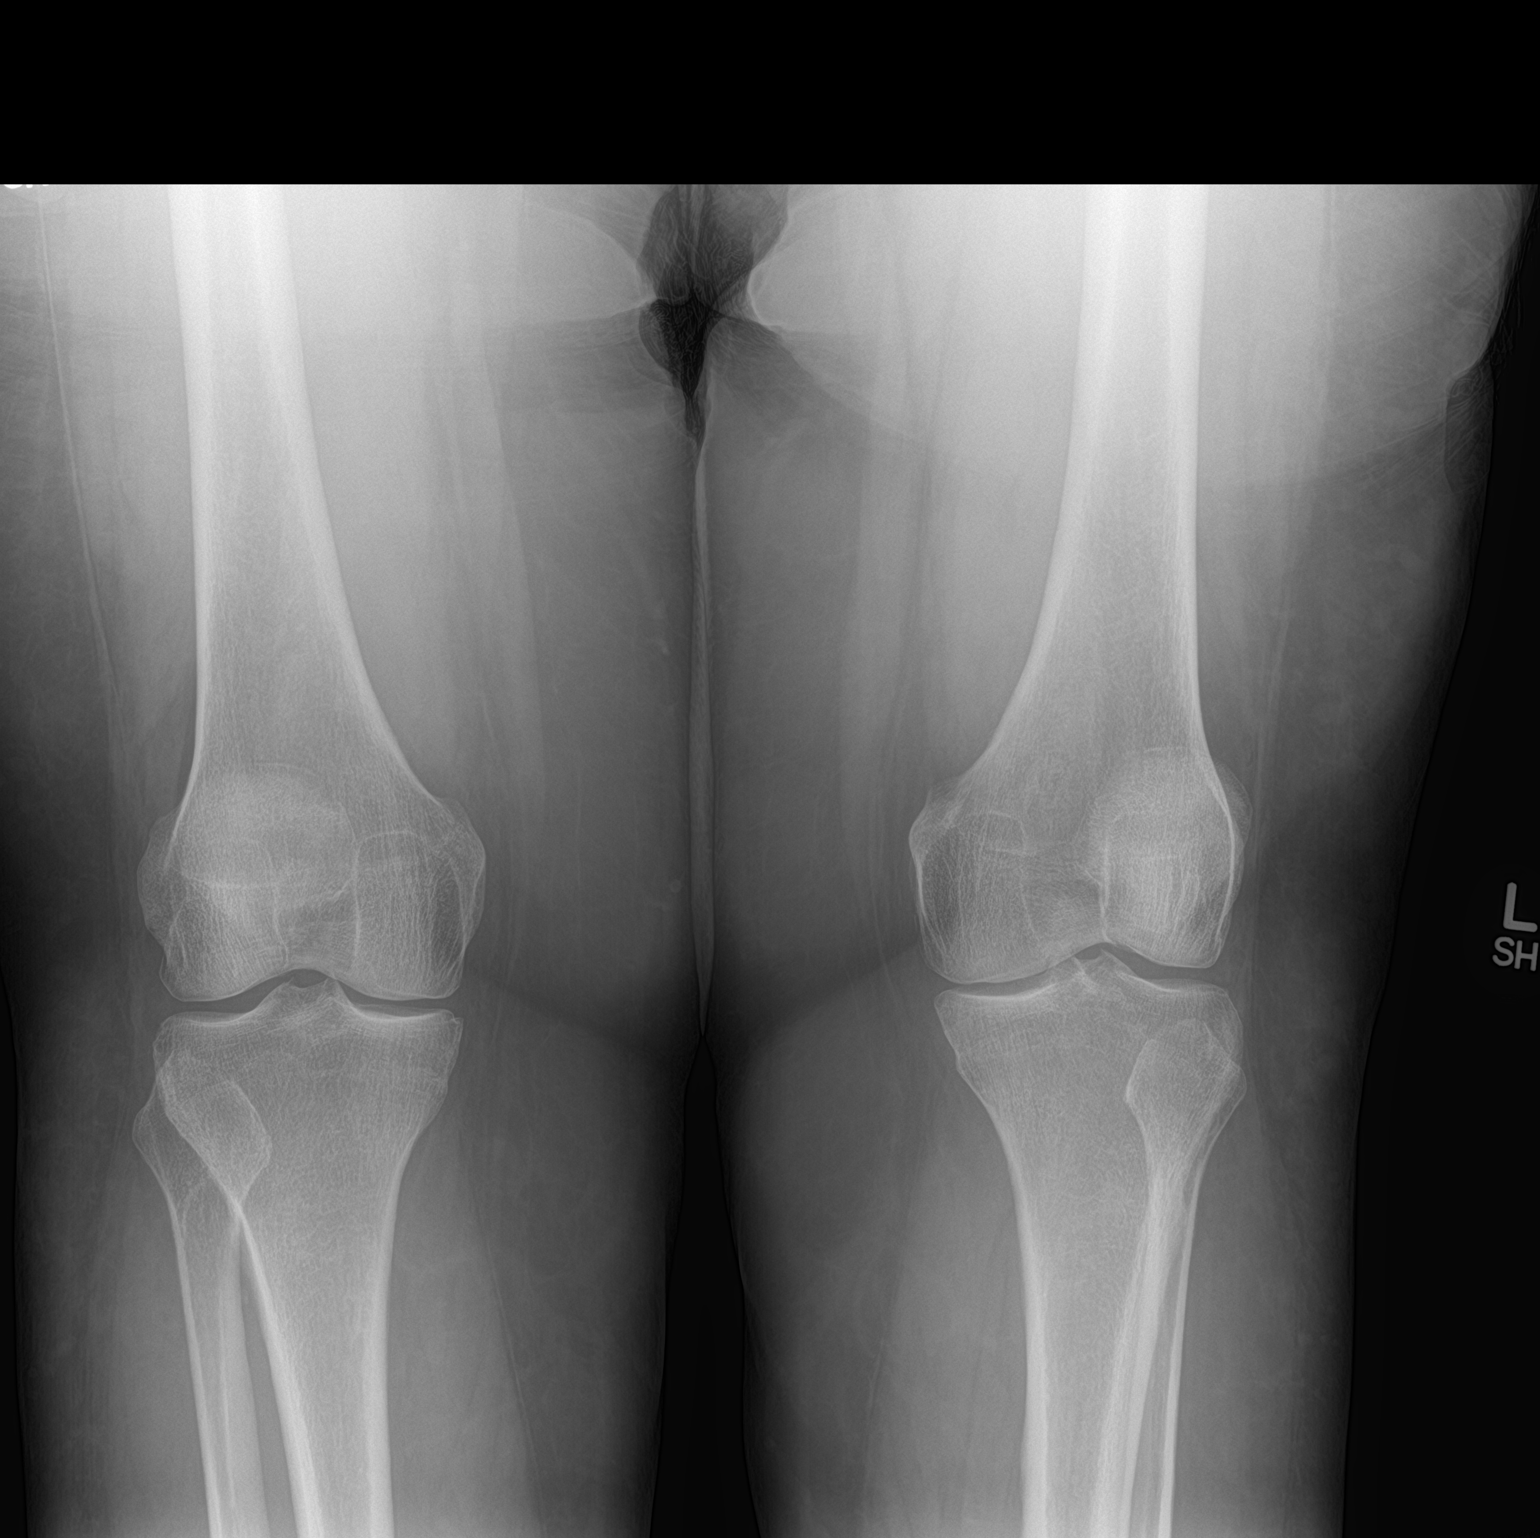

[knee ap bilat standing (2 of 2)]
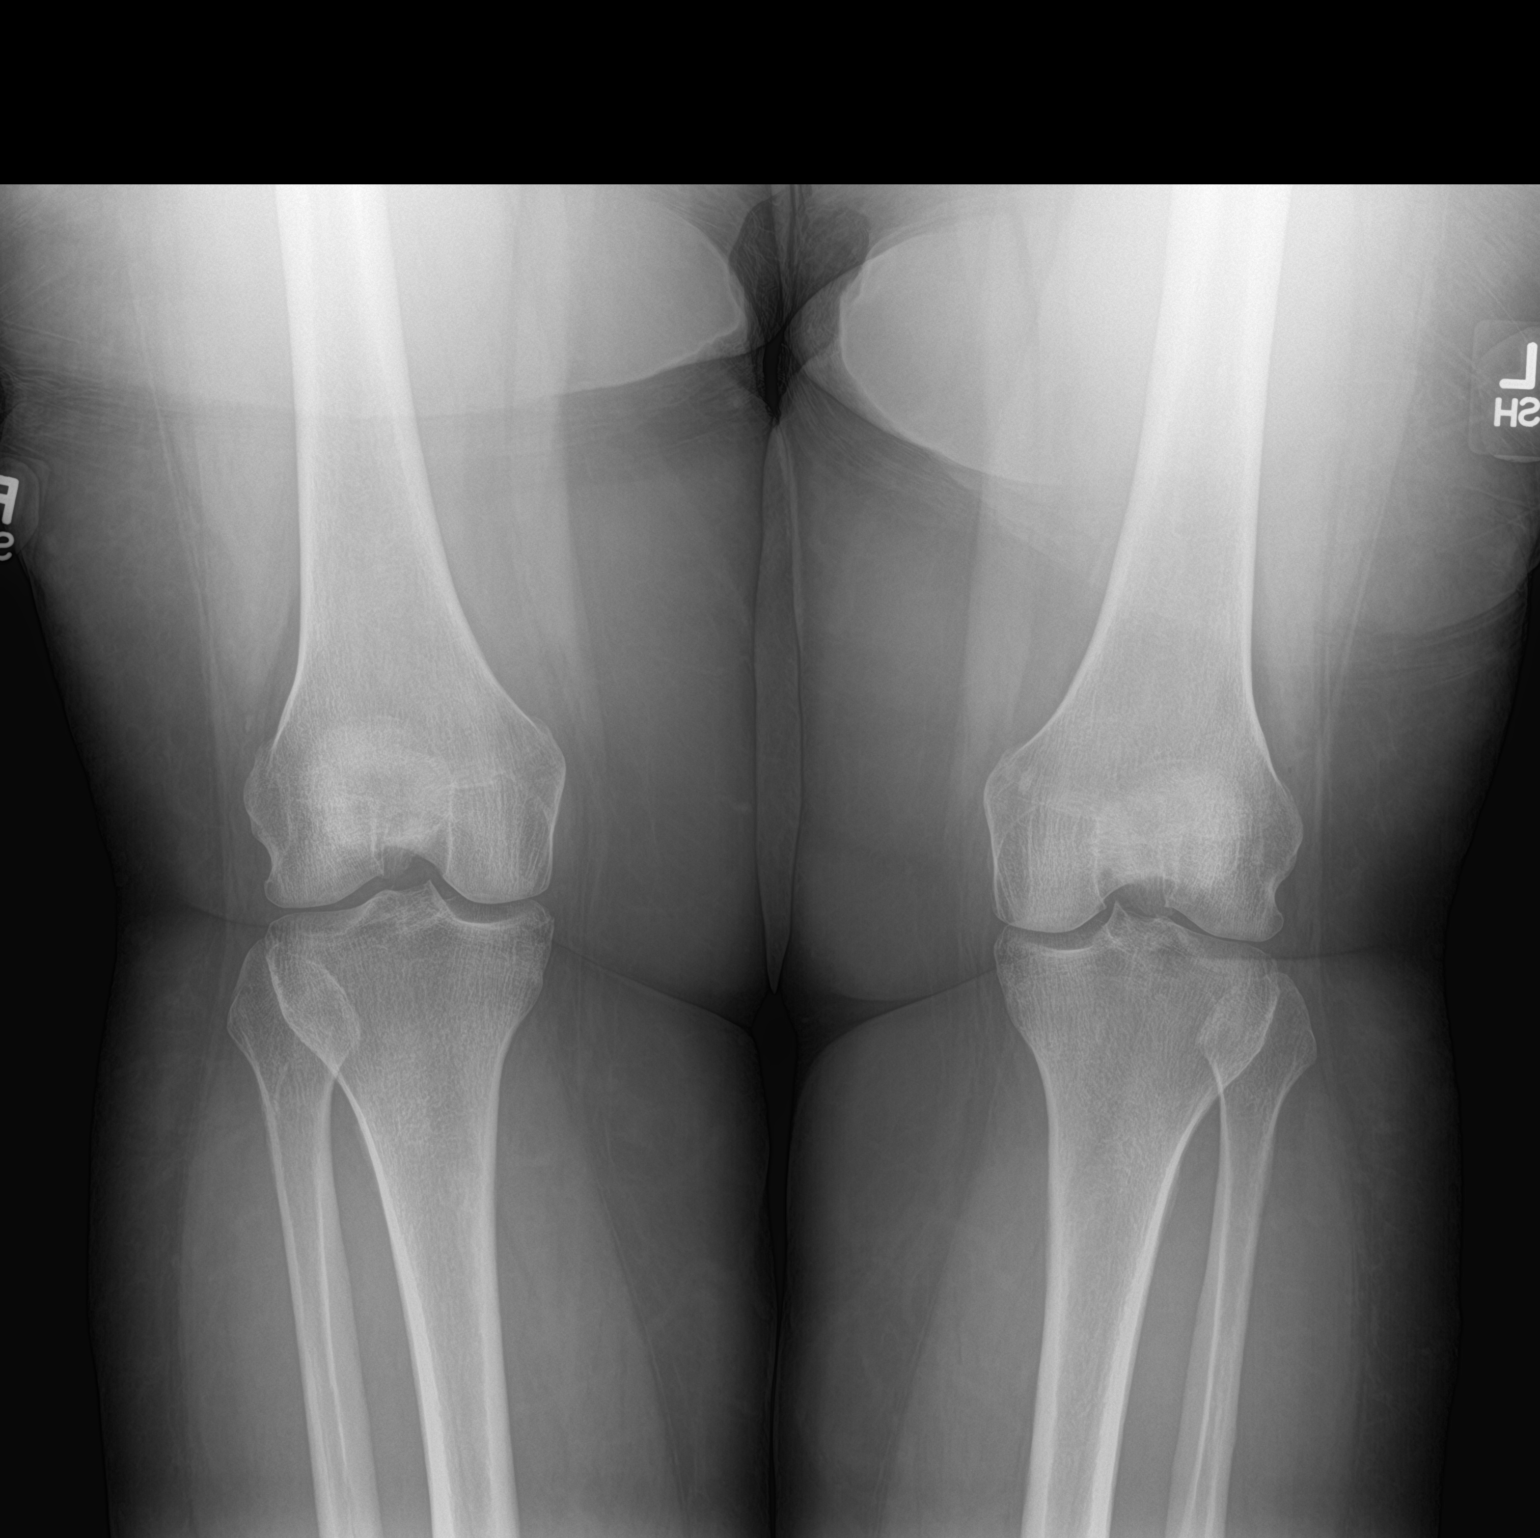

[2 of 2 positions shown; findings below may reference images not displayed]

FINDINGS: No evidence of fracture, dislocation, or joint effusion. No evidence
of arthropathy or other focal bone abnormality. Soft tissues are
unremarkable. Limited evaluation of the right knee is unremarkable.
IMPRESSION: Negative.

## 2023-02-22 ENCOUNTER — Ambulatory Visit (INDEPENDENT_AMBULATORY_CARE_PROVIDER_SITE_OTHER): Payer: 59 | Admitting: Family Medicine

## 2023-02-22 ENCOUNTER — Encounter: Payer: Self-pay | Admitting: Family Medicine

## 2023-02-22 VITALS — BP 132/52 | HR 84 | Ht 65.0 in | Wt 284.0 lb

## 2023-02-22 DIAGNOSIS — E282 Polycystic ovarian syndrome: Secondary | ICD-10-CM | POA: Diagnosis not present

## 2023-02-22 DIAGNOSIS — E88819 Insulin resistance, unspecified: Secondary | ICD-10-CM | POA: Diagnosis not present

## 2023-02-22 DIAGNOSIS — Z6841 Body Mass Index (BMI) 40.0 and over, adult: Secondary | ICD-10-CM

## 2023-02-22 DIAGNOSIS — F902 Attention-deficit hyperactivity disorder, combined type: Secondary | ICD-10-CM | POA: Diagnosis not present

## 2023-02-22 MED ORDER — ZEPBOUND 10 MG/0.5ML ~~LOC~~ SOAJ
10.0000 mg | SUBCUTANEOUS | 0 refills | Status: DC
Start: 2023-02-22 — End: 2023-04-12

## 2023-02-22 MED ORDER — AMPHETAMINE-DEXTROAMPHET ER 20 MG PO CP24: 20.0000 mg | ORAL_CAPSULE | ORAL | 0 refills | Status: DC

## 2023-02-22 MED ORDER — AMPHETAMINE-DEXTROAMPHETAMINE 10 MG PO TABS: 10.0000 mg | ORAL_TABLET | Freq: Every day | ORAL | 0 refills | Status: DC | PRN

## 2023-02-22 MED ORDER — ZEPBOUND 7.5 MG/0.5ML ~~LOC~~ SOAJ: 7.5000 mg | SUBCUTANEOUS | 0 refills | Status: DC

## 2023-02-22 MED ORDER — ONDANSETRON HCL 4 MG PO TABS: 4.0000 mg | ORAL_TABLET | Freq: Three times a day (TID) | ORAL | 0 refills | Status: AC | PRN

## 2023-02-22 NOTE — Progress Notes (Signed)
Established Patient Office Visit  Subjective   Patient ID: Barbara Mcmahon, female    DOB: 10/26/1975  Age: 47 y.o. MRN: [id number]  Chief Complaint  Patient presents with   Weight Check    She has been out of medication x 2 weeks (Zepbound 7.5mg )    HPI  F/U IFG/PCOS - she has been out of the Zepbound for about 2 weeks. Was on 7.5mg  . In the meantime she has stuck diligently to her diet. She has been skipping the sweats, focusing on proteins and veggies.  She has really tired to stay active with dancing, etc. [facility] and [facility] for exercise for 30-45 min.      ROS    Objective:     BP (!) 132/52   Pulse 84   Ht 5\' 5"  (1.651 m)   Wt 284 lb (128.8 kg)   SpO2 100%   BMI 47.26 kg/m    Physical Exam Vitals and nursing note reviewed.  Constitutional:      Appearance: She is well-developed.  HENT:     Head: Normocephalic and atraumatic.  Cardiovascular:     Rate and Rhythm: Normal rate and regular rhythm.     Heart sounds: Normal heart sounds.  Pulmonary:     Effort: Pulmonary effort is normal.     Breath sounds: Normal breath sounds.  Skin:    General: Skin is warm and dry.  Neurological:     Mental Status: She is alert and oriented to person, place, and time.  Psychiatric:        Behavior: Behavior normal.      No results found for any visits on 02/22/23.    The 10-year ASCVD risk score (Arnett DK, et al., 2019) is: 1.3%    Assessment & Plan:   Problem List Items Addressed This Visit       Endocrine   PCOS (polycystic ovarian syndrome) - Primary (Chronic)   Insulin resistance    Visit #: 3 Starting Weight: 285 lbs    Current weight: 284 lbs Previous weight: 285 lbs  Change in weight form last OV: down 1 lb  Goal weight:  Dietary goals: Continue to work on lots of protein, vegetables and reducing sweets.   Exercise goals: Swimming class and treading water.  And interested in maybe even doing a pole  class.  Medication: Was on stepdown 5 mg but was unable to get the 7.5 she says she called them as 30 pharmacies without finding someone that had it in stock.  I did give her a printed prescription for the 7.5 mg in the 10 mg in case she is able to get one of them.  Also sent her for Zofran for the nausea she says normally the first 2 nights after the injection she will feel little nauseated. Follow-up and referrals: 6 weeks      Relevant Medications   tirzepatide (ZEPBOUND) 7.5 MG/0.5ML Pen   tirzepatide (ZEPBOUND) 10 MG/0.5ML Pen     Other   BMI 40.0-44.9, adult (HCC)   Relevant Medications   amphetamine-dextroamphetamine (ADDERALL XR) 20 MG 24 hr capsule   amphetamine-dextroamphetamine (ADDERALL) 10 MG tablet   tirzepatide (ZEPBOUND) 7.5 MG/0.5ML Pen   tirzepatide (ZEPBOUND) 10 MG/0.5ML Pen   Attention deficit hyperactivity disorder (ADHD), combined type   Relevant Medications   amphetamine-dextroamphetamine (ADDERALL XR) 20 MG 24 hr capsule   amphetamine-dextroamphetamine (ADDERALL) 10 MG tablet    Return in about 6  weeks (around 04/05/2023) for medication/IFG.    Nani Gasser, MD

## 2023-02-22 NOTE — Assessment & Plan Note (Addendum)
Visit #: 3 Starting Weight: 285 lbs    Current weight: 284 lbs Previous weight: 285 lbs  Change in weight form last OV: down 1 lb  Goal weight:  Dietary goals: Continue to work on lots of protein, vegetables and reducing sweets.   Exercise goals: Swimming class and treading water.  And interested in maybe even doing a pole class.  Medication: Was on stepdown 5 mg but was unable to get the 7.5 she says she called them as 30 pharmacies without finding someone that had it in stock.  I did give her a printed prescription for the 7.5 mg in the 10 mg in case she is able to get one of them.  Also sent her for Zofran for the nausea she says normally the first 2 nights after the injection she will feel little nauseated. Follow-up and referrals: 6 weeks

## 2023-02-24 ENCOUNTER — Other Ambulatory Visit: Payer: Self-pay | Admitting: Family Medicine

## 2023-02-24 DIAGNOSIS — F902 Attention-deficit hyperactivity disorder, combined type: Secondary | ICD-10-CM

## 2023-02-26 ENCOUNTER — Encounter: Payer: Self-pay | Admitting: Family

## 2023-02-26 ENCOUNTER — Inpatient Hospital Stay: Payer: 59 | Attending: Hematology & Oncology

## 2023-02-26 ENCOUNTER — Inpatient Hospital Stay (HOSPITAL_BASED_OUTPATIENT_CLINIC_OR_DEPARTMENT_OTHER): Payer: 59 | Admitting: Family

## 2023-02-26 VITALS — BP 131/70 | HR 94 | Temp 98.6°F | Resp 18 | Wt 287.1 lb

## 2023-02-26 DIAGNOSIS — E282 Polycystic ovarian syndrome: Secondary | ICD-10-CM | POA: Diagnosis not present

## 2023-02-26 DIAGNOSIS — Z86711 Personal history of pulmonary embolism: Secondary | ICD-10-CM | POA: Diagnosis not present

## 2023-02-26 DIAGNOSIS — Z7901 Long term (current) use of anticoagulants: Secondary | ICD-10-CM | POA: Insufficient documentation

## 2023-02-26 DIAGNOSIS — I2699 Other pulmonary embolism without acute cor pulmonale: Secondary | ICD-10-CM | POA: Diagnosis present

## 2023-02-26 LAB — CBC WITH DIFFERENTIAL (CANCER CENTER ONLY)
Abs Immature Granulocytes: 0.04 10*3/uL (ref 0.00–0.07)
Basophils Absolute: 0.1 10*3/uL (ref 0.0–0.1)
Basophils Relative: 1 %
Eosinophils Absolute: 0.1 10*3/uL (ref 0.0–0.5)
Eosinophils Relative: 1 %
HCT: 41.2 % (ref 36.0–46.0)
Hemoglobin: 13.6 g/dL (ref 12.0–15.0)
Immature Granulocytes: 0 %
Lymphocytes Relative: 26 %
Lymphs Abs: 2.5 10*3/uL (ref 0.7–4.0)
MCH: 31.1 pg (ref 26.0–34.0)
MCHC: 33 g/dL (ref 30.0–36.0)
MCV: 94.1 fL (ref 80.0–100.0)
Monocytes Absolute: 0.8 10*3/uL (ref 0.1–1.0)
Monocytes Relative: 8 %
Neutro Abs: 6.1 10*3/uL (ref 1.7–7.7)
Neutrophils Relative %: 64 %
Platelet Count: 314 10*3/uL (ref 150–400)
RBC: 4.38 MIL/uL (ref 3.87–5.11)
RDW: 12.2 % (ref 11.5–15.5)
WBC Count: 9.6 10*3/uL (ref 4.0–10.5)
nRBC: 0 % (ref 0.0–0.2)

## 2023-02-26 LAB — CMP (CANCER CENTER ONLY)
ALT: 21 U/L (ref 0–44)
AST: 16 U/L (ref 15–41)
Albumin: 4 g/dL (ref 3.5–5.0)
Alkaline Phosphatase: 70 U/L (ref 38–126)
Anion gap: 5 (ref 5–15)
BUN: 21 mg/dL — ABNORMAL HIGH (ref 6–20)
CO2: 26 mmol/L (ref 22–32)
Calcium: 9.2 mg/dL (ref 8.9–10.3)
Chloride: 108 mmol/L (ref 98–111)
Creatinine: 0.69 mg/dL (ref 0.44–1.00)
GFR, Estimated: >60 mL/min (ref >60)
Glucose, Bld: 108 mg/dL — ABNORMAL HIGH (ref 70–99)
Potassium: 3.9 mmol/L (ref 3.5–5.1)
Sodium: 139 mmol/L (ref 135–145)
Total Bilirubin: 0.3 mg/dL (ref 0.3–1.2)
Total Protein: 6.8 g/dL (ref 6.5–8.1)

## 2023-02-26 LAB — D-DIMER, QUANTITATIVE: D-Dimer, Quant: <0.27 ug/mL-FEU (ref 0.00–0.50)

## 2023-02-26 NOTE — Progress Notes (Signed)
Hematology and Oncology Follow Up Visit  Barbara Mcmahon [id number] 1976/04/15 47 y.o. 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 twice daily-complete 1 year of therapy in September 2019 Eilquis 2.5 mg po BID - start 06/08/2018   Interim History:  Ms. Siemens is here today for follow-up. She is doing well but has occasional fatigue at times.  She continues to stay busy with her sweet family and also travels a lot for work.  She is taking her Eliquis 2.5 mg PO BID as prescribed.  No abnormal blood loss. No bruising or petechiae.  No fever, chills, n/v, cough, rash, dizziness, SOB, chest pain, palpitations, abdominal pain or changes in bowel or bladder habits at this time.  No tenderness, numbness or tingling in her extremities.  No falls or syncope reported.  Appetite is good. She has noted a curbed appetite and nausea after the Zepbound help with weight loss. Right now it is on back order. Weight is stable at 287 lbs.    ECOG Performance Status: 1 - Symptomatic but completely ambulatory  Medications:  Allergies as of 02/26/2023       Reactions   Cinnamon Shortness Of Breath, Rash   Wheezing   Morphine Hives, Itching, Rash   Other reaction(s): Other (See Comments) BEHAVIOR CHANGES Big mood changes irritable   Nutmeg Oil (myristica Oil) Shortness Of Breath, Rash   wheezing Other reaction(s): Not available   Shellfish Allergy Anaphylaxis, Hives, Swelling   Throat closes and wheezing;   per pt shrimp only   Shrimp Extract    Other reaction(s): Wheezing Other reaction(s): wheezing   Codeine Hives, Itching, Rash   Pt says she can take percocet   Tramadol Itching, Rash   Candida Albicans    Other reaction(s): Not available   Clonazepam Itching   Hydrocodone Hives   Menthol Hives   Robitussin A-c [guaifenesin-codeine] Itching   Yeast Hives        Medication  List        Accurate as of Feb 26, 2023  9:14 AM. If you have any questions, ask your nurse or doctor.          acetaminophen 650 MG CR tablet Commonly known as: TYLENOL Take 1,300 mg by mouth as needed for pain.   amitriptyline 25 MG tablet Commonly known as: ELAVIL Take 1 tablet (25 mg total) by mouth at bedtime.   amphetamine-dextroamphetamine 20 MG 24 hr capsule Commonly known as: ADDERALL XR Take 1 capsule (20 mg total) by mouth every morning.   amphetamine-dextroamphetamine 10 MG tablet Commonly known as: Adderall Take 1 tablet (10 mg total) by mouth daily as needed (ADHD).   BARIATRIC MULTIVITAMINS/IRON PO Take 1 tablet by mouth 2 (two) times daily.   cetirizine 10 MG tablet Commonly known as: ZYRTEC Take 10 mg by mouth at bedtime.   cyclobenzaprine 10 MG tablet Commonly known as: FLEXERIL Take 10 mg by mouth as needed for muscle spasms.   DULoxetine 60 MG capsule Commonly known as: CYMBALTA Take 2 capsules (120 mg total) by mouth daily.   Eliquis 5 MG Tabs tablet Generic drug: apixaban TAKE 1 TABLET(5 MG) BY MOUTH TWICE DAILY   EPINEPHrine 0.3 mg/0.3 mL Soaj injection Commonly known as: EPI-PEN Inject 0.3 mg into the muscle once as needed (anaphylaxis).   Melatonin 5 MG Chew Chew by mouth at bedtime.   ondansetron  4 MG tablet Commonly known as: Zofran Take 1 tablet (4 mg total) by mouth every 8 (eight) hours as needed for nausea or vomiting.   Slynd 4 MG Tabs Generic drug: Drospirenone Take 1 tablet by mouth daily.   Zepbound 7.5 MG/0.5ML Pen Generic drug: tirzepatide Inject 7.5 mg into the skin once a week.   Zepbound 10 MG/0.5ML Pen Generic drug: tirzepatide Inject 10 mg into the skin once a week.        Allergies:  Allergies  Allergen Reactions   Cinnamon Shortness Of Breath and Rash    Wheezing    Morphine Hives, Itching and Rash    Other reaction(s): Other (See Comments) BEHAVIOR CHANGES Big mood changes irritable    Nutmeg Oil (Myristica Oil) Shortness Of Breath and Rash    wheezing Other reaction(s): Not available   Shellfish Allergy Anaphylaxis, Hives and Swelling    Throat closes and wheezing;   per pt shrimp only   Shrimp Extract     Other reaction(s): Wheezing Other reaction(s): wheezing   Codeine Hives, Itching and Rash    Pt says she can take percocet   Tramadol Itching and Rash   Candida Albicans     Other reaction(s): Not available   Clonazepam Itching   Hydrocodone Hives   Menthol Hives   Robitussin A-C [Guaifenesin-Codeine] Itching   Yeast Hives    Past Medical History, Surgical history, Social history, and Family History were reviewed and updated.  Review of Systems: All other 10 point review of systems is negative.   Physical Exam:  weight is 287 lb 1.3 oz (130.2 kg). Her oral temperature is 98.6 F (37 C). Her blood pressure is 131/70 and her pulse is 94. Her respiration is 18 and oxygen saturation is 99%.   Wt Readings from Last 3 Encounters:  02/26/23 287 lb 1.3 oz (130.2 kg)  02/22/23 284 lb (128.8 kg)  12/13/22 285 lb 1.9 oz (129.3 kg)    Ocular: Sclerae unicteric, pupils equal, round and reactive to light Ear-nose-throat: Oropharynx clear, dentition fair Lymphatic: No cervical or supraclavicular adenopathy Lungs no rales or rhonchi, good excursion bilaterally Heart regular rate and rhythm, no murmur appreciated Abd soft, nontender, positive bowel sounds MSK no focal spinal tenderness, no joint edema Neuro: non-focal, well-oriented, appropriate affect Breasts: Deferred   Lab Results  Component Value Date   WBC 9.6 02/26/2023   HGB 13.6 02/26/2023   HCT 41.2 02/26/2023   MCV 94.1 02/26/2023   PLT 314 02/26/2023   No results found for: "FERRITIN", "IRON", "TIBC", "UIBC", "IRONPCTSAT" Lab Results  Component Value Date   RBC 4.38 02/26/2023   No results found for: "KPAFRELGTCHN", "LAMBDASER", "KAPLAMBRATIO" No results found for: "IGGSERUM", "IGA",  "IGMSERUM" No results found for: "TOTALPROTELP", "ALBUMINELP", "A1GS", "A2GS", "BETS", "BETA2SER", "GAMS", "MSPIKE", "SPEI"   Chemistry      Component Value Date/Time   NA 140 08/28/2022 0838   NA 141 04/20/2020 1441   NA 140 10/11/2017 1122   K 4.0 08/28/2022 0838   K 4.2 10/11/2017 1122   CL 108 08/28/2022 0838   CL 103 10/11/2017 1122   CO2 26 08/28/2022 0838   CO2 29 10/11/2017 1122   BUN 22 (H) 08/28/2022 0838   BUN 14 04/20/2020 1441   BUN 10 10/11/2017 1122   CREATININE 0.68 08/28/2022 0838   CREATININE 0.71 12/22/2020 0930      Component Value Date/Time   CALCIUM 8.8 (L) 08/28/2022 0838   CALCIUM 8.7 10/11/2017 1122   ALKPHOS  67 08/28/2022 0838   ALKPHOS 83 10/11/2017 1122   AST 15 08/28/2022 0838   ALT 13 08/28/2022 0838   ALT 36 10/11/2017 1122   BILITOT 0.3 08/28/2022 0838       Impression and Plan: Ms. Demario is a very pleasant 47 yo caucasian female with PCOS and history of idiopathic PE in September 2018. She has done well and so far there has been no evidence of recurrence.  She will continue her maintenance Eliquis at 2.5 mg PO BID.  Follow-up in 6 months.   Eileen Stanford, NP 5/21/20249:14 AM

## 2023-03-12 ENCOUNTER — Encounter (INDEPENDENT_AMBULATORY_CARE_PROVIDER_SITE_OTHER): Payer: 59 | Admitting: Sports Medicine

## 2023-03-12 DIAGNOSIS — M533 Sacrococcygeal disorders, not elsewhere classified: Secondary | ICD-10-CM | POA: Diagnosis not present

## 2023-03-15 MED ORDER — PREGABALIN 50 MG PO CAPS
ORAL_CAPSULE | ORAL | 3 refills | Status: DC
Start: 2023-03-15 — End: 2023-11-04

## 2023-03-15 NOTE — Telephone Encounter (Signed)
I spent 5 total minutes of online digital evaluation and management services in this patient-initiated request for online care. 

## 2023-04-01 ENCOUNTER — Other Ambulatory Visit: Payer: Self-pay | Admitting: *Deleted

## 2023-04-09 ENCOUNTER — Other Ambulatory Visit: Payer: Self-pay | Admitting: Family Medicine

## 2023-04-09 ENCOUNTER — Encounter: Payer: Self-pay | Admitting: Family Medicine

## 2023-04-09 DIAGNOSIS — F902 Attention-deficit hyperactivity disorder, combined type: Secondary | ICD-10-CM

## 2023-04-10 MED ORDER — AMPHETAMINE-DEXTROAMPHET ER 20 MG PO CP24: 20.0000 mg | ORAL_CAPSULE | ORAL | 0 refills | Status: DC

## 2023-04-12 ENCOUNTER — Encounter: Payer: Self-pay | Admitting: Family Medicine

## 2023-04-12 ENCOUNTER — Ambulatory Visit (INDEPENDENT_AMBULATORY_CARE_PROVIDER_SITE_OTHER): Payer: 59 | Admitting: Family Medicine

## 2023-04-12 ENCOUNTER — Telehealth: Payer: Self-pay

## 2023-04-12 VITALS — BP 129/45 | HR 94 | Ht 65.0 in | Wt 280.0 lb

## 2023-04-12 DIAGNOSIS — E88819 Insulin resistance, unspecified: Secondary | ICD-10-CM

## 2023-04-12 DIAGNOSIS — E282 Polycystic ovarian syndrome: Secondary | ICD-10-CM | POA: Diagnosis not present

## 2023-04-12 LAB — POCT GLYCOSYLATED HEMOGLOBIN (HGB A1C): Hemoglobin A1C: 5.7 % — AB (ref 4.0–5.6)

## 2023-04-12 MED ORDER — ZEPBOUND 10 MG/0.5ML ~~LOC~~ SOAJ: 10.0000 mg | SUBCUTANEOUS | 1 refills | Status: DC

## 2023-04-12 NOTE — Progress Notes (Signed)
Established Patient Office Visit  Subjective   Patient ID: Barbara Mcmahon, female    DOB: 1975/12/14  Age: 47 y.o. MRN: [id number]  Chief Complaint  Patient presents with   Weight Check    HPI   PCOS/Impaired fasting glucose-no increased thirst or urination. No symptoms consistent with hypoglycemia.Currently on Zepbound.  She feels like the medication has been really helpful it is just taking a while for her to get the medicine and in fact she has not been able to get her refill over the last week.  Work is still been really busy she has been traveling a lot.  They have also been short staffed.  She is trying to stay active.  She follows someone online for some exercise routines but has not really been able to get into the pool like she wanted to.    ROS    Objective:     BP (!) 129/45   Pulse 94   Ht 5\' 5"  (1.651 m)   Wt 280 lb (127 kg)   SpO2 99%   BMI 46.59 kg/m    Physical Exam Vitals and nursing note reviewed.  Constitutional:      Appearance: She is well-developed.  HENT:     Head: Normocephalic and atraumatic.  Cardiovascular:     Rate and Rhythm: Normal rate and regular rhythm.     Heart sounds: Normal heart sounds.  Pulmonary:     Effort: Pulmonary effort is normal.     Breath sounds: Normal breath sounds.  Skin:    General: Skin is warm and dry.  Neurological:     Mental Status: She is alert and oriented to person, place, and time.  Psychiatric:        Behavior: Behavior normal.      Results for orders placed or performed in visit on 04/12/23  POCT glycosylated hemoglobin (Hb A1C)  Result Value Ref Range   Hemoglobin A1C 5.7 (A) 4.0 - 5.6 %   HbA1c POC (<> result, manual entry)     HbA1c, POC (prediabetic range)     HbA1c, POC (controlled diabetic range)        The 10-year ASCVD risk score (Arnett DK, et al., 2019) is: 1.3%    Assessment & Plan:   Problem List Items Addressed This Visit       Endocrine   PCOS (polycystic ovarian  syndrome) (Chronic)    See note above.       Insulin resistance - Primary    A1c is down to 5.7 which is fantastic!  Great work!  Visit #: 4 Starting Weight: 285 lbs    Current weight:  280 lbs  Previous weight: 284 lbs  Change in weight form last OV: Down 4 lbs  Goal weight: Have not set yet.   Dietary goals: Continue to work on lots of protein, vegetables and reducing sweets.   Exercise goals: Swimming class and treading water.  And interested in maybe even doing a pole class.  Medication: Inc Zepbound 10mg   .Zofran for the nausea she says normally the first 2 nights after the injection she will feel little nauseated. Follow-up and referrals: 6 weeks      Relevant Orders   POCT glycosylated hemoglobin (Hb A1C) (Completed)    Return in about 6 weeks (around 05/24/2023) for Wt mgt .   I spent 15 minutes on the day of the encounter to include pre-visit record review, face-to-face time with the patient and post visit ordering of test.  Nani Gasser, MD

## 2023-04-12 NOTE — Telephone Encounter (Signed)
Francesco Runner - PA required for Zepbound 10 mg if not yet completed.   Per the provider's request contacted the pharmacy regarding the prior auth for Zepbound. The pharm tech stated it still shows in their system that a prior auth is required. The pharm tech was informed  that there is a prior auth on file and it's valid until 07/18/23, that this is a pharmacy override. I was advised from the pharm tech that they would reach out to the insurance for an update and the request may not be handled until tomorrow because they short-staffed (currently 2 pharm techs available). I was informed that a prior auth will need to be completed for Zepbound 10 mg.   I tried calling the patient, no answer at time of call. Left a detailed vm msg of the latest update. Patient was provided with prior auth direct number information [phone]).

## 2023-04-12 NOTE — Assessment & Plan Note (Addendum)
A1c is down to 5.7 which is fantastic!  Great work!  Visit #: 4 Starting Weight: 285 lbs    Current weight:  280 lbs  Previous weight: 284 lbs  Change in weight form last OV: Down 4 lbs  Goal weight: Have not set yet.   Dietary goals: Continue to work on lots of protein, vegetables and reducing sweets.   Exercise goals: Swimming class and treading water.  And interested in maybe even doing a pole class.  Medication: Inc Zepbound 10mg   .Zofran for the nausea she says normally the first 2 nights after the injection she will feel little nauseated. Follow-up and referrals: 6 weeks

## 2023-04-12 NOTE — Assessment & Plan Note (Signed)
See note above

## 2023-04-15 NOTE — Telephone Encounter (Signed)
[facility] ,pt has has a filled this medication 2 for 28 10 mg  Zepbound on the 04/12/23.Pt has a valid prior authorization approval on file till 07/18/23.  The rejection at the pharmacy was the quantity amount of 4 ml for every 28 days instead of 2 for every 28.   Called [facility] spoke with Marchelle Folks  they state that the pt picked up the medication  on 04/12/23 and no pa is was required once the rejection for the quantity amount was adjusted, pt paid 24.99.  This case has been resolved.

## 2023-04-29 ENCOUNTER — Other Ambulatory Visit: Payer: Self-pay | Admitting: Hematology & Oncology

## 2023-04-29 DIAGNOSIS — R0602 Shortness of breath: Secondary | ICD-10-CM

## 2023-05-07 ENCOUNTER — Other Ambulatory Visit: Payer: Self-pay | Admitting: Family Medicine

## 2023-05-07 DIAGNOSIS — F902 Attention-deficit hyperactivity disorder, combined type: Secondary | ICD-10-CM

## 2023-05-07 MED ORDER — AMPHETAMINE-DEXTROAMPHET ER 20 MG PO CP24: 20.0000 mg | ORAL_CAPSULE | ORAL | 0 refills | Status: DC

## 2023-05-07 NOTE — Telephone Encounter (Signed)
Last OV: 04/12/23 Next OV: 05/31/23 Last RF: 04/10/23

## 2023-05-08 ENCOUNTER — Encounter: Payer: Self-pay | Admitting: Family Medicine

## 2023-05-09 NOTE — Telephone Encounter (Signed)
Requesting rx rf of Zepbound Last written 04/12/2023 Last OV 04/12/2023 Upcoming appt 05/31/2023

## 2023-05-10 MED ORDER — ZEPBOUND 10 MG/0.5ML ~~LOC~~ SOAJ: 10.0000 mg | SUBCUTANEOUS | 1 refills | Status: DC

## 2023-05-17 ENCOUNTER — Ambulatory Visit: Payer: 59 | Admitting: Dietician

## 2023-05-31 ENCOUNTER — Ambulatory Visit: Payer: 59 | Admitting: Family Medicine

## 2023-05-31 NOTE — Progress Notes (Deleted)
Established Patient Office Visit  Subjective   Patient ID: Barbara Mcmahon, female    DOB: 1976-05-31  Age: 47 y.o. MRN: [id number]  No chief complaint on file.   HPI F/U IFG -    {History (Optional):23778}  ROS    Objective:     There were no vitals taken for this visit. {Vitals History (Optional):23777}  Physical Exam Vitals and nursing note reviewed.  Constitutional:      Appearance: Normal appearance.  HENT:     Head: Normocephalic and atraumatic.  Eyes:     Conjunctiva/sclera: Conjunctivae normal.  Cardiovascular:     Rate and Rhythm: Normal rate and regular rhythm.  Pulmonary:     Effort: Pulmonary effort is normal.     Breath sounds: Normal breath sounds.  Skin:    General: Skin is warm and dry.  Neurological:     Mental Status: She is alert.  Psychiatric:        Mood and Affect: Mood normal.    No results found for any visits on 05/31/23.  {Labs (Optional):23779}  The 10-year ASCVD risk score (Arnett DK, et al., 2019) is: 1.3%    Assessment & Plan:   Problem List Items Addressed This Visit       Endocrine   PCOS (polycystic ovarian syndrome) - Primary (Chronic)   Insulin resistance    A1c is down to 5.7 which is fantastic!    Visit #: 4 Starting Weight: 285 lbs    Current weight:    Previous weight: 280 lbs  Change in weight form last OV:  Goal weight:  Dietary goals: Continue to work on lots of protein, vegetables and reducing sweets.   Exercise goals: Swimming class and treading water.  And interested in maybe even doing a pole class.  Medication: Inc Zepbound 10mg   .Zofran for the nausea she says normally the first 2 nights after the injection she will feel little nauseated. Follow-up and referrals: 6 weeks         No follow-ups on file.    Nani Gasser, MD

## 2023-05-31 NOTE — Assessment & Plan Note (Deleted)
A1c is down to 5.7 which is fantastic!    Visit #: 4 Starting Weight: 285 lbs    Current weight:    Previous weight: 280 lbs  Change in weight form last OV:  Goal weight:  Dietary goals: Continue to work on lots of protein, vegetables and reducing sweets.   Exercise goals: Swimming class and treading water.  And interested in maybe even doing a pole class.  Medication: Inc Zepbound 10mg   .Zofran for the nausea she says normally the first 2 nights after the injection she will feel little nauseated. Follow-up and referrals: 6 weeks

## 2023-06-25 ENCOUNTER — Encounter: Payer: Self-pay | Admitting: Family Medicine

## 2023-06-25 ENCOUNTER — Ambulatory Visit (INDEPENDENT_AMBULATORY_CARE_PROVIDER_SITE_OTHER): Payer: 59 | Admitting: Family Medicine

## 2023-06-25 VITALS — BP 123/75 | HR 69 | Ht 65.0 in | Wt 271.0 lb

## 2023-06-25 DIAGNOSIS — E88819 Insulin resistance, unspecified: Secondary | ICD-10-CM | POA: Diagnosis not present

## 2023-06-25 DIAGNOSIS — Z23 Encounter for immunization: Secondary | ICD-10-CM | POA: Diagnosis not present

## 2023-06-25 DIAGNOSIS — H9202 Otalgia, left ear: Secondary | ICD-10-CM

## 2023-06-25 MED ORDER — METHYLPREDNISOLONE ACETATE 40 MG/ML IJ SUSP
40.0000 mg | Freq: Once | INTRAMUSCULAR | Status: AC
Start: 2023-06-25 — End: 2023-06-25
  Administered 2023-06-25: 40 mg via INTRAMUSCULAR

## 2023-06-25 NOTE — Assessment & Plan Note (Signed)
Visit #: 5 Starting Weight: 285 lbs    Current weight:   271 lbs  Previous weight: 280 lbs  Change in weight form last OV: down 9 lbs  Goal weight:  Dietary goals: getting in 100 g protein daily, work on vegetables . Has cut out sweet.  Exercise goals: Swimming class and treading water.  And interested in maybe even doing a pole class.  Medication: Continue Zepbound 10mg   .Zofran for the nausea prn  Follow-up and referrals: 8 weeks

## 2023-06-25 NOTE — Progress Notes (Signed)
Established Patient Office Visit  Subjective   Patient ID: Barbara Mcmahon, female    DOB: Jul 05, 1976  Age: 47 y.o. MRN: [id number]  Chief Complaint  Patient presents with   Weight Check   Ear Pain    L ear pain she was seen @ [facility] on 8/27 for this and advised to take Sudafed. She did this however, this did not help    HPI F/U Weight Check  -she is doing well but is just not seeing a rapid weight change and just wanted to make sure that she was on track.  She is trying to purposely eat 100 g of protein daily she is really working on water.  She is trying to stay active but exercises still little hit or miss.  She is really cut the sweets out and has been pretty consistent with that a lot of the cravings for sweets have actually gone away since being on the medication.  Also complains of left ear pain and.  She says she has this deep itching but is also painful at times is not really impacted her right ear she takes a daily antihistamine and uses a nasal steroid spray.     ROS    Objective:     BP 123/75   Pulse 69   Ht 5\' 5"  (1.651 m)   Wt 271 lb (122.9 kg)   SpO2 100%   BMI 45.10 kg/m    Physical Exam Vitals and nursing note reviewed.  Constitutional:      Appearance: Normal appearance.  HENT:     Head: Normocephalic and atraumatic.     Right Ear: Tympanic membrane, ear canal and external ear normal.     Left Ear: Tympanic membrane, ear canal and external ear normal.     Mouth/Throat:     Pharynx: Oropharynx is clear.  Eyes:     Conjunctiva/sclera: Conjunctivae normal.  Cardiovascular:     Rate and Rhythm: Normal rate and regular rhythm.  Pulmonary:     Effort: Pulmonary effort is normal.     Breath sounds: Normal breath sounds.  Skin:    General: Skin is warm and dry.  Neurological:     Mental Status: She is alert and oriented to person, place, and time.  Psychiatric:        Mood and Affect: Mood normal.        Behavior: Behavior normal.       No results found for any visits on 06/25/23.    The 10-year ASCVD risk score (Arnett DK, et al., 2019) is: 1.2%    Assessment & Plan:   Problem List Items Addressed This Visit       Endocrine   Insulin resistance    Visit #: 5 Starting Weight: 285 lbs    Current weight:   271 lbs  Previous weight: 280 lbs  Change in weight form last OV: down 9 lbs  Goal weight:  Dietary goals: getting in 100 g protein daily, work on vegetables . Has cut out sweet.  Exercise goals: Swimming class and treading water.  And interested in maybe even doing a pole class.  Medication: Continue Zepbound 10mg   .Zofran for the nausea prn  Follow-up and referrals: 8 weeks      Other Visit Diagnoses     Left ear pain    -  Primary   Relevant Medications   methylPREDNISolone acetate (DEPO-MEDROL) injection 40 mg (Completed)   Encounter for immunization  Relevant Orders   Flu vaccine trivalent PF, 6mos and older(Flulaval,Afluria,Fluarix,Fluzone) (Completed)      Ear pain, left -most consistent with eustachian tube dysfunction and seasonal allergies.  Though she is already on an antihistamine and a nasal steroid spray.  So we discussed doing a steroid to see if that provides some acute relief.  If not improving then please let us know and can consider ENT referral if needed.     Return in about 6 weeks (around 08/06/2023) for weight check.    Nani Gasser, MD

## 2023-06-30 ENCOUNTER — Other Ambulatory Visit: Payer: Self-pay | Admitting: Family Medicine

## 2023-06-30 ENCOUNTER — Encounter: Payer: Self-pay | Admitting: Family Medicine

## 2023-06-30 DIAGNOSIS — F902 Attention-deficit hyperactivity disorder, combined type: Secondary | ICD-10-CM

## 2023-06-30 DIAGNOSIS — E88819 Insulin resistance, unspecified: Secondary | ICD-10-CM

## 2023-07-01 MED ORDER — AMPHETAMINE-DEXTROAMPHET ER 20 MG PO CP24
20.0000 mg | ORAL_CAPSULE | ORAL | 0 refills | Status: DC
Start: 2023-07-01 — End: 2023-07-15

## 2023-07-01 MED ORDER — ZEPBOUND 10 MG/0.5ML ~~LOC~~ SOAJ: 10.0000 mg | SUBCUTANEOUS | 3 refills | Status: DC

## 2023-07-01 NOTE — Telephone Encounter (Signed)
Requesting rx rf of Adderall XR 20mg   Last written 05/07/2023 Last OV 06/25/2023 Upcoming appt 08/06/2023

## 2023-07-08 ENCOUNTER — Other Ambulatory Visit: Payer: Self-pay | Admitting: Family Medicine

## 2023-07-08 DIAGNOSIS — F902 Attention-deficit hyperactivity disorder, combined type: Secondary | ICD-10-CM

## 2023-07-08 MED ORDER — ZEPBOUND 10 MG/0.5ML ~~LOC~~ SOAJ
10.0000 mg | SUBCUTANEOUS | 1 refills | Status: DC
Start: 2023-07-08 — End: 2023-10-07

## 2023-07-08 NOTE — Telephone Encounter (Signed)
Sent for 90 days  Meds ordered this encounter  Medications   DISCONTD: tirzepatide (ZEPBOUND) 10 MG/0.5ML Pen    Sig: Inject 10 mg into the skin once a week.    Dispense:  4 mL    Refill:  3   tirzepatide (ZEPBOUND) 10 MG/0.5ML Pen    Sig: Inject 10 mg into the skin once a week.    Dispense:  6 mL    Refill:  1

## 2023-07-14 ENCOUNTER — Encounter: Payer: Self-pay | Admitting: Family Medicine

## 2023-07-14 DIAGNOSIS — F902 Attention-deficit hyperactivity disorder, combined type: Secondary | ICD-10-CM

## 2023-07-15 MED ORDER — AMPHETAMINE-DEXTROAMPHET ER 15 MG PO CP24
15.0000 mg | ORAL_CAPSULE | ORAL | 0 refills | Status: DC
Start: 2023-07-15 — End: 2023-10-07

## 2023-07-15 NOTE — Telephone Encounter (Signed)
Meds ordered this encounter  Medications   amphetamine-dextroamphetamine (ADDERALL XR) 15 MG 24 hr capsule    Sig: Take 1 capsule by mouth every morning.    Dispense:  30 capsule    Refill:  0

## 2023-08-06 ENCOUNTER — Ambulatory Visit (INDEPENDENT_AMBULATORY_CARE_PROVIDER_SITE_OTHER): Payer: 59 | Admitting: Family Medicine

## 2023-08-06 VITALS — BP 102/59 | HR 81 | Ht 65.0 in | Wt 264.0 lb

## 2023-08-06 DIAGNOSIS — F5101 Primary insomnia: Secondary | ICD-10-CM | POA: Insufficient documentation

## 2023-08-06 DIAGNOSIS — E88819 Insulin resistance, unspecified: Secondary | ICD-10-CM

## 2023-08-06 DIAGNOSIS — E282 Polycystic ovarian syndrome: Secondary | ICD-10-CM | POA: Diagnosis not present

## 2023-08-06 DIAGNOSIS — G43909 Migraine, unspecified, not intractable, without status migrainosus: Secondary | ICD-10-CM

## 2023-08-06 MED ORDER — TRAZODONE HCL 50 MG PO TABS
25.0000 mg | ORAL_TABLET | Freq: Every day | ORAL | 1 refills | Status: DC
Start: 2023-08-06 — End: 2023-10-11

## 2023-08-06 NOTE — Assessment & Plan Note (Signed)
Visit #: 6 Starting Weight: 285 lbs    Current weight:   264 lbs  Previous weight: 271 lbs  Change in weight form last OV: down 7 lbs  Goal weight:  Dietary goals: Getting in 100 g protein daily, work on vegetables . Has cut out sweets.  Exercise goals: trying to get in short walks and moving more.  Medication: Continue Zepbound 10mg   .Zofran for the nausea prn  Follow-up and referrals: 8 weeks

## 2023-08-06 NOTE — Progress Notes (Signed)
Established Patient Office Visit  Subjective   Patient ID: Barbara Mcmahon, female    DOB: 02/10/1976  Age: 47 y.o. MRN: [id number]  Chief Complaint  Patient presents with   Weight Check    HPI  PCOS/insulin resistance - she is actually doing really well with the Zepbound at 10 mg.  She feels like it really has controlled her appetite is making it a lot easier to manage her diet she still working on try to get in 100 g of protein daily and trying to get moving as much as she can she does not quite have dedicated exercise time she is traveling a little less so that has been helpful good.  She says for the last several weeks she has not been sleeping well she says she has been a little bit more stressed but does not feel excessively worried.  She definitely cut back on her caffeine intake.  She does still take her Adderall but skips it on the weekend and does not really notice a difference in the sleep quality not taking it she can usually fall asleep but has difficulty staying asleep will often wake up in the middle the night and then cannot go back to sleep.  Migraines have not been well-controlled she was recently taken off of the amitriptyline about 3 weeks ago and switched to [facility] but she has had very really bad migraines since the switch.    ROS    Objective:     BP (!) 102/59   Pulse 81   Ht 5\' 5"  (1.651 m)   Wt 264 lb (119.7 kg)   SpO2 95%   BMI 43.93 kg/m    Physical Exam Vitals and nursing note reviewed.  Constitutional:      Appearance: Normal appearance.  HENT:     Head: Normocephalic and atraumatic.  Eyes:     Conjunctiva/sclera: Conjunctivae normal.  Cardiovascular:     Rate and Rhythm: Normal rate and regular rhythm.  Pulmonary:     Effort: Pulmonary effort is normal.     Breath sounds: Normal breath sounds.  Skin:    General: Skin is warm and dry.  Neurological:     Mental Status: She is alert.  Psychiatric:        Mood and Affect: Mood normal.      No results found for any visits on 08/06/23.    The 10-year ASCVD risk score (Arnett DK, et al., 2019) is: 0.8%    Assessment & Plan:   Problem List Items Addressed This Visit       Cardiovascular and Mediastinum   Migraine    Will try to improve this week quality over the next couple of weeks and see if that is helpful for the migraines but if not she may need to reach out to her neurologist and discussed maybe switching to something besides the Qulipta since it sounds like it really has not been very helpful.  I think if we can avoid the amitriptyline which can sometimes work against her attempts at weight loss that would be ideal but if we needed to go back to it at a low dose that would not be unreasonable.      Relevant Medications   Atogepant (QULIPTA) 30 MG TABS   traZODone (DESYREL) 50 MG tablet     Endocrine   PCOS (polycystic ovarian syndrome) - Primary (Chronic)   Insulin resistance    Visit #: 6 Starting Weight: 285 lbs    Current  weight:   264 lbs  Previous weight: 271 lbs  Change in weight form last OV: down 7 lbs  Goal weight:  Dietary goals: Getting in 100 g protein daily, work on vegetables . Has cut out sweets.  Exercise goals: trying to get in short walks and moving more.  Medication: Continue Zepbound 10mg   .Zofran for the nausea prn  Follow-up and referrals: 8 weeks        Other   Primary insomnia    We discussed maybe using medication short-term.  She already takes melatonin and has tried Benadryl over-the-counter.  She could also consider a trial of Unisom and or valerian root.  But will do a short trial of trazodone can start with 25 mg and up to 50 if needed.  I want to see to you if the poor sleep quality might be actually triggering the migraines.      Relevant Medications   traZODone (DESYREL) 50 MG tablet    Return in about 2 months (around 10/06/2023) for Weight Mgt .    Nani Gasser, MD

## 2023-08-06 NOTE — Assessment & Plan Note (Signed)
We discussed maybe using medication short-term.  She already takes melatonin and has tried Benadryl over-the-counter.  She could also consider a trial of Unisom and or valerian root.  But will do a short trial of trazodone can start with 25 mg and up to 50 if needed.  I want to see to you if the poor sleep quality might be actually triggering the migraines.

## 2023-08-06 NOTE — Assessment & Plan Note (Signed)
Will try to improve this week quality over the next couple of weeks and see if that is helpful for the migraines but if not she may need to reach out to her neurologist and discussed maybe switching to something besides the Qulipta since it sounds like it really has not been very helpful.  I think if we can avoid the amitriptyline which can sometimes work against her attempts at weight loss that would be ideal but if we needed to go back to it at a low dose that would not be unreasonable.

## 2023-08-09 ENCOUNTER — Ambulatory Visit
Admission: RE | Admit: 2023-08-09 | Discharge: 2023-08-09 | Disposition: A | Discharge status: Home / Self Care | Payer: 59 | Source: Ambulatory Visit | Attending: Obstetrics and Gynecology | Admitting: Obstetrics and Gynecology

## 2023-08-09 DIAGNOSIS — Z9189 Other specified personal risk factors, not elsewhere classified: Secondary | ICD-10-CM

## 2023-08-09 MED ORDER — GADOPICLENOL 0.5 MMOL/ML IV SOLN
10.0000 mL | Freq: Once | INTRAVENOUS | Status: AC | PRN
  Administered 2023-08-09: 10 mL via INTRAVENOUS

## 2023-08-12 ENCOUNTER — Other Ambulatory Visit: Payer: Self-pay | Admitting: Obstetrics and Gynecology

## 2023-08-12 DIAGNOSIS — R928 Other abnormal and inconclusive findings on diagnostic imaging of breast: Secondary | ICD-10-CM

## 2023-08-15 ENCOUNTER — Encounter: Payer: Self-pay | Admitting: Family Medicine

## 2023-08-19 ENCOUNTER — Ambulatory Visit
Admission: RE | Admit: 2023-08-19 | Discharge: 2023-08-19 | Disposition: A | Discharge status: Home / Self Care | Payer: 59 | Source: Ambulatory Visit | Attending: Obstetrics and Gynecology | Admitting: Obstetrics and Gynecology

## 2023-08-19 DIAGNOSIS — R928 Other abnormal and inconclusive findings on diagnostic imaging of breast: Secondary | ICD-10-CM

## 2023-08-19 MED ORDER — GADOPICLENOL 0.5 MMOL/ML IV SOLN
10.0000 mL | Freq: Once | INTRAVENOUS | Status: AC | PRN
  Administered 2023-08-19: 10 mL via INTRAVENOUS

## 2023-08-20 LAB — SURGICAL PATHOLOGY

## 2023-08-24 ENCOUNTER — Encounter: Payer: Self-pay | Admitting: Family Medicine

## 2023-08-24 DIAGNOSIS — U099 Post covid-19 condition, unspecified: Secondary | ICD-10-CM

## 2023-08-27 ENCOUNTER — Encounter: Payer: Self-pay | Admitting: Hematology & Oncology

## 2023-08-27 ENCOUNTER — Inpatient Hospital Stay: Payer: 59 | Attending: Family

## 2023-08-27 ENCOUNTER — Inpatient Hospital Stay (HOSPITAL_BASED_OUTPATIENT_CLINIC_OR_DEPARTMENT_OTHER): Payer: 59 | Admitting: Hematology & Oncology

## 2023-08-27 VITALS — BP 122/49 | HR 83 | Temp 98.2°F | Resp 18 | Ht 65.0 in | Wt 263.0 lb

## 2023-08-27 DIAGNOSIS — Z7901 Long term (current) use of anticoagulants: Secondary | ICD-10-CM | POA: Insufficient documentation

## 2023-08-27 DIAGNOSIS — E282 Polycystic ovarian syndrome: Secondary | ICD-10-CM | POA: Insufficient documentation

## 2023-08-27 DIAGNOSIS — Z86711 Personal history of pulmonary embolism: Secondary | ICD-10-CM

## 2023-08-27 LAB — CBC WITH DIFFERENTIAL (CANCER CENTER ONLY)
Abs Immature Granulocytes: 0.01 10*3/uL (ref 0.00–0.07)
Basophils Absolute: 0 10*3/uL (ref 0.0–0.1)
Basophils Relative: 1 %
Eosinophils Absolute: 0.1 10*3/uL (ref 0.0–0.5)
Eosinophils Relative: 1 %
HCT: 38.5 % (ref 36.0–46.0)
Hemoglobin: 13.2 g/dL (ref 12.0–15.0)
Immature Granulocytes: 0 %
Lymphocytes Relative: 29 %
Lymphs Abs: 1.9 10*3/uL (ref 0.7–4.0)
MCH: 31.9 pg (ref 26.0–34.0)
MCHC: 34.3 g/dL (ref 30.0–36.0)
MCV: 93 fL (ref 80.0–100.0)
Monocytes Absolute: 0.5 10*3/uL (ref 0.1–1.0)
Monocytes Relative: 8 %
Neutro Abs: 4.1 10*3/uL (ref 1.7–7.7)
Neutrophils Relative %: 61 %
Platelet Count: 263 10*3/uL (ref 150–400)
RBC: 4.14 MIL/uL (ref 3.87–5.11)
RDW: 12.4 % (ref 11.5–15.5)
WBC Count: 6.6 10*3/uL (ref 4.0–10.5)
nRBC: 0 % (ref 0.0–0.2)

## 2023-08-27 LAB — CMP (CANCER CENTER ONLY)
ALT: 34 U/L (ref 0–44)
AST: 29 U/L (ref 15–41)
Albumin: 4.2 g/dL (ref 3.5–5.0)
Alkaline Phosphatase: 64 U/L (ref 38–126)
Anion gap: 5 (ref 5–15)
BUN: 15 mg/dL (ref 6–20)
CO2: 29 mmol/L (ref 22–32)
Calcium: 9.4 mg/dL (ref 8.9–10.3)
Chloride: 106 mmol/L (ref 98–111)
Creatinine: 0.8 mg/dL (ref 0.44–1.00)
GFR, Estimated: >60 mL/min (ref >60)
Glucose, Bld: 107 mg/dL — ABNORMAL HIGH (ref 70–99)
Potassium: 4.3 mmol/L (ref 3.5–5.1)
Sodium: 140 mmol/L (ref 135–145)
Total Bilirubin: 0.5 mg/dL (ref <1.2)
Total Protein: 6.8 g/dL (ref 6.5–8.1)

## 2023-08-27 NOTE — Progress Notes (Signed)
Hematology and Oncology Follow Up Visit  Barbara Mcmahon [id number] [birth year] 47 y.o. 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 twice daily-complete 1 year of therapy in September 2019 Eilquis 2.5 mg po BID - start 06/08/2018     Interim History:  Barbara Mcmahon is back for follow-up.  She is doing quite well.  She is losing quite a bit of weight.  She is on Zepbound.  She really is doing a good job with this..  She also is now on Qulipta for migraines.  She saw neurologist.  Surprising, she had a breast biopsy on 08/19/2023.  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  There is some apocrine metaplasia.  Apparently, her sister has breast cancer.  Barbara Mcmahon has alternating mammogram and MRI every 6 months.  She has had no problems with the Eliquis.  She had a wonderful time in [place] and [place] earlier this year.  They really enjoyed it.  They probably will be going back..  She is still working.  She is being promoted.  I am really not surprised by this..  She has had no issues with nausea or vomiting.  She has had no problems with cough or shortness of breath.  She has had no bleeding.  She has had no change in bowel or bladder habits.  Overall, I would say performance status is probably ECOG 0.    Medications:  Current Outpatient Medications:    acetaminophen (TYLENOL) 650 MG CR tablet, Take 1,300 mg by mouth as needed for pain., Disp: , Rfl:    amphetamine-dextroamphetamine (ADDERALL XR) 15 MG 24 hr capsule, Take 1 capsule by mouth every morning., Disp: 30 capsule, Rfl: 0   amphetamine-dextroamphetamine (ADDERALL) 10 MG tablet, Take 1 tablet (10 mg total) by mouth daily as needed (ADHD)., Disp: 30 tablet, Rfl: 0   Atogepant (QULIPTA) 30 MG TABS, 60 mg., Disp: , Rfl:    cetirizine (ZYRTEC) 10 MG tablet, Take 10 mg by mouth at bedtime., Disp: , Rfl:     DULoxetine (CYMBALTA) 60 MG capsule, Take 2 capsules (120 mg total) by mouth daily., Disp: 180 capsule, Rfl: 3   ELIQUIS 5 MG TABS tablet, TAKE 1 TABLET BY MOUTH TWICE DAILY, Disp: 180 tablet, Rfl: 1   EPINEPHrine 0.3 mg/0.3 mL IJ SOAJ injection, Inject 0.3 mg into the muscle once as needed (anaphylaxis)., Disp: 1 each, Rfl: 1   Melatonin 5 MG CHEW, Chew 10 mg by mouth at bedtime., Disp: , Rfl:    Multiple Vitamins-Minerals (BARIATRIC MULTIVITAMINS/IRON PO), Take 1 tablet by mouth 2 (two) times daily., Disp: , Rfl:    ondansetron (ZOFRAN) 4 MG tablet, Take 1 tablet (4 mg total) by mouth every 8 (eight) hours as needed for nausea or vomiting., Disp: 20 tablet, Rfl: 0   pregabalin (LYRICA) 50 MG capsule, 1 capsule p.o. nightly for a week then twice daily for a week then 3 times daily, Disp: 90 capsule, Rfl: 3   rizatriptan (MAXALT-MLT) 10 MG disintegrating tablet, Take 10 mg by mouth as needed for migraine., Disp: , Rfl:    SLYND 4 MG TABS, Take 1 tablet by mouth daily., Disp: , Rfl:    tirzepatide (ZEPBOUND) 10 MG/0.5ML Pen, Inject 10 mg into the skin once a week., Disp: 6 mL, Rfl: 1   traZODone (DESYREL) 50 MG tablet, Take 0.5-1 tablets (25-50 mg total) by mouth at bedtime., Disp: 30  tablet, Rfl: 1  Allergies:  Allergies  Allergen Reactions   Cinnamon Shortness Of Breath and Rash    Wheezing    Morphine Hives, Itching and Rash    Other reaction(s): Other (See Comments) BEHAVIOR CHANGES Big mood changes irritable   Nutmeg Oil (Myristica Oil) Shortness Of Breath and Rash    wheezing Other reaction(s): Not available   Shellfish Allergy Anaphylaxis, Hives and Swelling    Throat closes and wheezing;   per pt shrimp only   Shrimp Extract     Other reaction(s): Wheezing Other reaction(s): wheezing   Codeine Hives, Itching and Rash    Pt says she can take percocet   Tramadol Itching and Rash   Candida Albicans     Other reaction(s): Not available   Clonazepam Itching   Hydrocodone Hives    Menthol Hives   Robitussin A-C [Guaifenesin-Codeine] Itching   Yeast Hives    Past Medical History, Surgical history, Social history, and Family History were reviewed and updated.  Review of Systems: Review of Systems  Constitutional: Negative.   HENT:  Negative.    Eyes: Negative.   Respiratory: Negative.    Cardiovascular: Negative.   Gastrointestinal: Negative.   Endocrine: Negative.   Genitourinary: Negative.    Musculoskeletal: Negative.   Skin: Negative.   Neurological: Negative.   Hematological: Negative.   Psychiatric/Behavioral: Negative.      Physical Exam:  Temperature is 98.9.  Pulse is 81.  Blood pressure 102/59.  Weight is 263 pounds.  Wt Readings from Last 3 Encounters:  08/06/23 264 lb (119.7 kg)  06/25/23 271 lb (122.9 kg)  04/12/23 280 lb (127 kg)    Physical Exam Vitals reviewed.  HENT:     Head: Normocephalic and atraumatic.  Eyes:     Pupils: Pupils are equal, round, and reactive to light.  Cardiovascular:     Rate and Rhythm: Normal rate and regular rhythm.     Heart sounds: Normal heart sounds.  Pulmonary:     Effort: Pulmonary effort is normal.     Breath sounds: Normal breath sounds.  Abdominal:     General: Bowel sounds are normal.     Palpations: Abdomen is soft.  Musculoskeletal:        General: No tenderness or deformity. Normal range of motion.     Cervical back: Normal range of motion.  Lymphadenopathy:     Cervical: No cervical adenopathy.  Skin:    General: Skin is warm and dry.     Findings: No erythema or rash.  Neurological:     Mental Status: She is alert and oriented to person, place, and time.  Psychiatric:        Behavior: Behavior normal.        Thought Content: Thought content normal.        Judgment: Judgment normal.      Lab Results  Component Value Date   WBC 6.6 08/27/2023   HGB 13.2 08/27/2023   HCT 38.5 08/27/2023   MCV 93.0 08/27/2023   PLT 263 08/27/2023     Chemistry      Component Value  Date/Time   NA 139 02/26/2023 0847   NA 141 04/20/2020 1441   NA 140 10/11/2017 1122   K 3.9 02/26/2023 0847   K 4.2 10/11/2017 1122   CL 108 02/26/2023 0847   CL 103 10/11/2017 1122   CO2 26 02/26/2023 0847   CO2 29 10/11/2017 1122   BUN 21 (H) 02/26/2023 0847  BUN 14 04/20/2020 1441   BUN 10 10/11/2017 1122   CREATININE 0.69 02/26/2023 0847   CREATININE 0.71 12/22/2020 0930      Component Value Date/Time   CALCIUM 9.2 02/26/2023 0847   CALCIUM 8.7 10/11/2017 1122   ALKPHOS 70 02/26/2023 0847   ALKPHOS 83 10/11/2017 1122   AST 16 02/26/2023 0847   ALT 21 02/26/2023 0847   ALT 36 10/11/2017 1122   BILITOT 0.3 02/26/2023 0847      Impression and Plan: Barbara Mcmahon is a 47 year-old white female.  She has polycystic ovaries.  She had a pulmonary embolism in September 2018.  This was idiopathic.  We  have her on Eliquis.  She likes to be on low-dose Eliquis.  She does travel for work.  She just feels more comfortable.  For right now, we will just get her back in 6 months.  Recommend that she does have Raynaud's.  I told her that if she ever has a flareup, she might be able to take some baby aspirin during the flareup to see if this may help.    Josph Macho, MD 11/19/20248:41 AM

## 2023-09-13 NOTE — Addendum Note (Signed)
Addended by: Nani Gasser D on: 09/13/2023 11:18 AM   Modules accepted: Orders

## 2023-10-07 ENCOUNTER — Ambulatory Visit (INDEPENDENT_AMBULATORY_CARE_PROVIDER_SITE_OTHER): Payer: 59 | Admitting: Family Medicine

## 2023-10-07 ENCOUNTER — Encounter: Payer: Self-pay | Admitting: Family Medicine

## 2023-10-07 VITALS — BP 115/53 | HR 115 | Ht 65.0 in | Wt 252.0 lb

## 2023-10-07 DIAGNOSIS — R197 Diarrhea, unspecified: Secondary | ICD-10-CM | POA: Diagnosis not present

## 2023-10-07 DIAGNOSIS — L299 Pruritus, unspecified: Secondary | ICD-10-CM | POA: Diagnosis not present

## 2023-10-07 DIAGNOSIS — F902 Attention-deficit hyperactivity disorder, combined type: Secondary | ICD-10-CM

## 2023-10-07 DIAGNOSIS — E88819 Insulin resistance, unspecified: Secondary | ICD-10-CM

## 2023-10-07 LAB — POCT GLYCOSYLATED HEMOGLOBIN (HGB A1C): Hemoglobin A1C: 5.3 % (ref 4.0–5.6)

## 2023-10-07 MED ORDER — AMPHETAMINE-DEXTROAMPHET ER 20 MG PO CP24: 20.0000 mg | ORAL_CAPSULE | ORAL | 0 refills | Status: DC

## 2023-10-07 MED ORDER — ZEPBOUND 10 MG/0.5ML ~~LOC~~ SOAJ: 10.0000 mg | SUBCUTANEOUS | 1 refills | Status: DC

## 2023-10-07 NOTE — Progress Notes (Addendum)
Established Patient Office Visit  Subjective  Patient ID: Barbara Mcmahon, female    DOB: [birth year]  Age: 48 y.o. MRN: [id number]  Chief Complaint  Patient presents with   Weight Check    HPI Here for follow-up impaired fasting glucose-currently doing well on set bed 10 mg happy with current dose and regimen.  She is doing absolutely fantastic.  She is just under 250 pounds on her home scale which has been an absolute success for her.  She says she has not been that way since before having children.  She is trying to stay active and working on setting some new goals.  She has a vision board that she has really relied on this year to stick to her goals and changes and has a best friend who is on a similar journey with her who is supportive.  She also wanted to just let me know that right before Thanksgiving she had gone out to eat and had significant diarrhea afterwards she feels like it could set her IBS off which she really has not battled in years.  The diarrhea has been coming and going since then but she does feel like it has gotten somewhat better.  Been trying to up her protein intake.  He is on up Stelara for her eczema and says its actually been responding really well.  More recently she has noticed a little bit of itching and irritation on the backs of her arms.  ROS      Objective:     BP (!) 115/53   Pulse (!) 115   Ht 5\' 5"  (1.651 m)   Wt 252 lb (114.3 kg)   SpO2 97%   BMI 41.93 kg/m    Physical Exam Vitals and nursing note reviewed.  Constitutional:      Appearance: Normal appearance.  HENT:     Head: Normocephalic and atraumatic.  Eyes:     Conjunctiva/sclera: Conjunctivae normal.  Cardiovascular:     Rate and Rhythm: Normal rate and regular rhythm.  Pulmonary:     Effort: Pulmonary effort is normal.     Breath sounds: Normal breath sounds.  Skin:    General: Skin is warm and dry.  Neurological:     Mental Status: She is alert.  Psychiatric:         Mood and Affect: Mood normal.      Results for orders placed or performed in visit on 10/07/23  POCT HgB A1C  Result Value Ref Range   Hemoglobin A1C 5.3 4.0 - 5.6 %   HbA1c POC (<> result, manual entry)     HbA1c, POC (prediabetic range)     HbA1c, POC (controlled diabetic range)        The 10-year ASCVD risk score (Arnett DK, et al., 2019) is: 1%    Assessment & Plan:   Problem List Items Addressed This Visit       Endocrine   Insulin resistance   Visit #: 7 Starting Weight: 285 lbs   Current weight:   252 lbs  Previous weight: 264 lbs  Change in weight form last OV: down 12 lbs  Goal weight:  Dietary goals: Getting in 100 g protein daily, work on vegetables . Has cut out sweets.  Exercise goals: trying to get in short walks and moving more.  Medication: Continue Zepbound 10mg   Zofran for the nausea prn  Follow-up and referrals: 10 weeks   Lab Results  Component Value Date   HGBA1C 5.3  10/07/2023         Relevant Medications   tirzepatide (ZEPBOUND) 10 MG/0.5ML Pen   Other Relevant Orders   POCT HgB A1C (Completed)   Other Visit Diagnoses       Diarrhea, unspecified type    -  Primary     Itchy skin          Itching and irritation in the back of her arms encouraged her to maybe do a trial with her eczema medicine for 2 weeks just to see if it is helpful.  Keep up the great work on the exercise.  Diarrhea-seems to be resolving.  But if continues then she can let us know if she was on medication at 1 point for her IBS.  Return in about 10 weeks (around 12/16/2023) for [facility] .    Nani Gasser, MD

## 2023-10-07 NOTE — Addendum Note (Signed)
Addended by: Nani Gasser D on: 10/07/2023 05:18 PM   Modules accepted: Level of Service

## 2023-10-07 NOTE — Assessment & Plan Note (Addendum)
Visit #: 7 Starting Weight: 285 lbs   Current weight:   252 lbs  Previous weight: 264 lbs  Change in weight form last OV: down 12 lbs  Goal weight:  Dietary goals: Getting in 100 g protein daily, work on vegetables . Has cut out sweets.  Exercise goals: trying to get in short walks and moving more.  Medication: Continue Zepbound 10mg   Zofran for the nausea prn  Follow-up and referrals: 10 weeks   Lab Results  Component Value Date   HGBA1C 5.3 10/07/2023

## 2023-10-11 ENCOUNTER — Other Ambulatory Visit: Payer: Self-pay | Admitting: *Deleted

## 2023-10-11 DIAGNOSIS — F5101 Primary insomnia: Secondary | ICD-10-CM

## 2023-10-11 MED ORDER — TRAZODONE HCL 50 MG PO TABS: 25.0000 mg | ORAL_TABLET | Freq: Every day | ORAL | 1 refills | Status: DC

## 2023-10-15 ENCOUNTER — Other Ambulatory Visit: Payer: Self-pay | Admitting: Hematology & Oncology

## 2023-10-15 DIAGNOSIS — R0602 Shortness of breath: Secondary | ICD-10-CM

## 2023-10-17 ENCOUNTER — Encounter: Payer: Self-pay | Admitting: Family Medicine

## 2023-10-17 DIAGNOSIS — F411 Generalized anxiety disorder: Secondary | ICD-10-CM

## 2023-10-22 NOTE — Telephone Encounter (Signed)
 Referral placed.  Patient would prefer to continue ongoing relationship with current therapist though they are now out of network.  Will need to go to [facility] [email or web address] and request prior authorization for the referral since this particular provider is out of network.  Patient also sent us  a network gap exception request form which has been completed as this will need to be submitted as well.

## 2023-10-24 ENCOUNTER — Telehealth: Payer: Self-pay

## 2023-10-24 NOTE — Telephone Encounter (Signed)
Per provider's request - completed a network gap exception form for the patient directly with [facility] (cpt [phone]) (decision id# [id number]). *Process unavailable online.   Per plan, patient has available OON benefits to cover upcoming future appointments. Insurance stated that if OON benefits are maxed out, a single case agreement can be completed on patient's behalf to continue therapy. Direct call back info for [facility] - [phone], Opt #2.  Attempted to contact the patient with an update. No answer. Left a brief vm. Direct call back info provided.

## 2023-10-30 ENCOUNTER — Ambulatory Visit (INDEPENDENT_AMBULATORY_CARE_PROVIDER_SITE_OTHER): Payer: 59 | Admitting: Family Medicine

## 2023-10-30 ENCOUNTER — Encounter: Payer: Self-pay | Admitting: Family Medicine

## 2023-10-30 VITALS — BP 124/61 | HR 89 | Ht 64.5 in | Wt 255.0 lb

## 2023-10-30 DIAGNOSIS — J019 Acute sinusitis, unspecified: Secondary | ICD-10-CM

## 2023-10-30 DIAGNOSIS — J029 Acute pharyngitis, unspecified: Secondary | ICD-10-CM | POA: Diagnosis not present

## 2023-10-30 LAB — POCT RAPID STREP A (OFFICE): Rapid Strep A Screen: NEGATIVE

## 2023-10-30 MED ORDER — AMOXICILLIN 875 MG PO TABS: 875.0000 mg | ORAL_TABLET | Freq: Two times a day (BID) | ORAL | 0 refills | Status: DC

## 2023-10-30 NOTE — Progress Notes (Signed)
ST  Acute Office Visit  Subjective:     Patient ID: Barbara Mcmahon, female    DOB: Jul 07, 1976, 48 y.o.   MRN: [id number]  Chief Complaint  Patient presents with   Sore Throat    Paitent c/o  nasal congestion  and drainage, sinus pressure,  feels like setting in chest, cough she feels is related to PND. X 1 week  and c/o ST and bilateral ear pain x Monday 10/28/23.     HPI Patient is in today for sinus sxs x x 10 days.  She says it started with sinus congestion and pressure.  But in the last couple days she is actually started develop more of a sore throat.  And feeling a little worse.  She did miss work last Monday and has been trying to work since then.  She did have a low-grade temperature initially but nothing in the last couple days.  2 of her kids at home were just diagnosed with strep throat so she was concerned with a new onset of sore throat and ear pressure that she might be getting strep throat as well.  She did have a home negative COVID test.  She is also noticed some swollen lymph nodes. she has been using nasal saline irrigation.   ROS      Objective:    BP 124/61   Pulse 89   Ht 5' 4.5" (1.638 m)   Wt 255 lb (115.7 kg)   SpO2 100%   BMI 43.09 kg/m    Physical Exam Constitutional:      Appearance: Normal appearance.  HENT:     Head: Normocephalic and atraumatic.     Right Ear: Tympanic membrane, ear canal and external ear normal. There is no impacted cerumen.     Left Ear: Tympanic membrane, ear canal and external ear normal. There is no impacted cerumen.     Nose: Nose normal.     Mouth/Throat:     Pharynx: Oropharynx is clear.  Eyes:     Conjunctiva/sclera: Conjunctivae normal.  Cardiovascular:     Rate and Rhythm: Normal rate and regular rhythm.  Pulmonary:     Effort: Pulmonary effort is normal.     Breath sounds: Normal breath sounds.  Musculoskeletal:     Cervical back: Neck supple. No tenderness.  Lymphadenopathy:     Cervical: No cervical  adenopathy.  Skin:    General: Skin is warm and dry.  Neurological:     Mental Status: She is alert and oriented to person, place, and time.  Psychiatric:        Mood and Affect: Mood normal.     Results for orders placed or performed in visit on 10/30/23  POCT rapid strep A  Result Value Ref Range   Rapid Strep A Screen Negative Negative        Assessment & Plan:   Problem List Items Addressed This Visit   None Visit Diagnoses       Acute pharyngitis, unspecified etiology    -  Primary   Relevant Orders   POCT rapid strep A (Completed)     Acute non-recurrent sinusitis, unspecified location       Relevant Medications   amoxicillin (AMOXIL) 875 MG tablet      Has had symptoms for 10 days and is not improving we will go ahead and treat for sinusitis.  She did test negative for strep throat, but we will she is amoxicillin to also cover for strep just to  be on the safe side.  Continue hygiene measures at home.  Meds ordered this encounter  Medications   amoxicillin (AMOXIL) 875 MG tablet    Sig: Take 1 tablet (875 mg total) by mouth 2 (two) times daily.    Dispense:  20 tablet    Refill:  0    Return if symptoms worsen or fail to improve.  Nani Gasser, MD

## 2023-10-31 NOTE — Telephone Encounter (Signed)
Spoke to patient during their appointment on 10/30/23. Per insurance, patient must utilize her OON benefits until she reaches her deductible. At that time she can request a Soil scientist. Patient verbalized understanding.

## 2023-11-04 ENCOUNTER — Other Ambulatory Visit: Payer: Self-pay | Admitting: Sports Medicine

## 2023-11-04 DIAGNOSIS — M533 Sacrococcygeal disorders, not elsewhere classified: Secondary | ICD-10-CM

## 2023-11-04 MED ORDER — PREGABALIN 50 MG PO CAPS: 50.0000 mg | ORAL_CAPSULE | Freq: Three times a day (TID) | ORAL | 3 refills | Status: DC

## 2023-11-11 ENCOUNTER — Telehealth: Payer: Self-pay

## 2023-11-11 NOTE — Telephone Encounter (Signed)
Prior auth for: Zepbound Determination: APPROVED Auth #: [id number] Valid from: 10/12/23 - 11/10/24 Patient notified via MyChart

## 2023-11-17 ENCOUNTER — Other Ambulatory Visit: Payer: Self-pay | Admitting: Family Medicine

## 2023-11-17 DIAGNOSIS — F902 Attention-deficit hyperactivity disorder, combined type: Secondary | ICD-10-CM

## 2023-11-18 MED ORDER — AMPHETAMINE-DEXTROAMPHET ER 20 MG PO CP24: 20.0000 mg | ORAL_CAPSULE | ORAL | 0 refills | Status: DC

## 2023-12-20 ENCOUNTER — Ambulatory Visit: Payer: 59 | Admitting: Family Medicine

## 2023-12-22 ENCOUNTER — Encounter: Payer: Self-pay | Admitting: Family Medicine

## 2023-12-22 ENCOUNTER — Other Ambulatory Visit: Payer: Self-pay | Admitting: Family Medicine

## 2023-12-22 DIAGNOSIS — F902 Attention-deficit hyperactivity disorder, combined type: Secondary | ICD-10-CM

## 2023-12-22 DIAGNOSIS — Z91013 Allergy to seafood: Secondary | ICD-10-CM

## 2023-12-23 MED ORDER — EPINEPHRINE 0.3 MG/0.3ML IJ SOAJ: 0.3000 mg | Freq: Once | INTRAMUSCULAR | 1 refills | Status: DC | PRN

## 2023-12-24 MED ORDER — AMPHETAMINE-DEXTROAMPHET ER 20 MG PO CP24: 20.0000 mg | ORAL_CAPSULE | ORAL | 0 refills | Status: DC

## 2023-12-24 NOTE — Telephone Encounter (Signed)
 Requesting rx rf of Adderall R 20mg  Last written 11/18/2023 Last OV 10/30/2023 sick visit Upcoming appt none

## 2023-12-25 ENCOUNTER — Other Ambulatory Visit: Payer: Self-pay | Admitting: *Deleted

## 2023-12-25 DIAGNOSIS — Z91013 Allergy to seafood: Secondary | ICD-10-CM

## 2023-12-25 MED ORDER — EPINEPHRINE 0.3 MG/0.3ML IJ SOAJ: 0.3000 mg | INTRAMUSCULAR | 1 refills | Status: DC | PRN

## 2023-12-27 ENCOUNTER — Encounter (HOSPITAL_COMMUNITY): Payer: Self-pay | Admitting: *Deleted

## 2023-12-30 ENCOUNTER — Encounter: Payer: Self-pay | Admitting: Sports Medicine

## 2023-12-30 ENCOUNTER — Ambulatory Visit (INDEPENDENT_AMBULATORY_CARE_PROVIDER_SITE_OTHER): Admitting: Sports Medicine

## 2023-12-30 VITALS — BP 99/64 | HR 104 | Resp 20 | Ht 64.0 in | Wt 235.0 lb

## 2023-12-30 DIAGNOSIS — I951 Orthostatic hypotension: Secondary | ICD-10-CM | POA: Insufficient documentation

## 2023-12-30 NOTE — Progress Notes (Addendum)
    Procedures performed today:    None.  Independent interpretation of notes and tests performed by another provider:   None.  Brief History, Exam, Impression, and Recommendations:    Orthostatic hypotension This is a very pleasant 48 year old female, she has done extremely well on her weight loss journey, she is over 70 to 80 pounds down, she is status post bariatric surgery and currently on tirzepatide . She has noticed over the past several weeks increasing dizziness when going from laying to sitting to standing, she has even fallen once and bumped into the wall. Today her pulse rate is elevated, it goes up to well over 110 when going from laying to standing. She is not on any blood pressure medication. I did explain the physiology, she will hydrate aggressively, liberalize salt in diet and potentially use salt containing beverages that are low in calories such as Gatorade G2. She will change positions slowly when going from laying to standing. I did explain if this fails we would consider lower extremity compression stockings, if that fails we can consider midodrine, and if that fails we can consider fludrocortisone. I would like some labs today. We will include urinalysis, free cortisol. I would like her to see her PCP in about 4 weeks to reevaluate.    ____________________________________________ Joselyn Nicely. Sandy Crumb, M.D., ABFM., CAQSM., AME. Primary Care and Sports Medicine [facility]  Adjunct Professor of [facility] Medicine  [employer]  Restaurant manager, fast food

## 2023-12-30 NOTE — Assessment & Plan Note (Addendum)
 This is a very pleasant 48 year old female, she has done extremely well on her weight loss journey, she is over 70 to 80 pounds down, she is status post bariatric surgery and currently on tirzepatide . She has noticed over the past several weeks increasing dizziness when going from laying to sitting to standing, she has even fallen once and bumped into the wall. Today her pulse rate is elevated, it goes up to well over 110 when going from laying to standing. She is not on any blood pressure medication. I did explain the physiology, she will hydrate aggressively, liberalize salt in diet and potentially use salt containing beverages that are low in calories such as Gatorade G2. She will change positions slowly when going from laying to standing. I did explain if this fails we would consider lower extremity compression stockings, if that fails we can consider midodrine, and if that fails we can consider fludrocortisone. I would like some labs today. We will include urinalysis, free cortisol. I would like her to see her PCP in about 4 weeks to reevaluate.

## 2024-01-01 ENCOUNTER — Other Ambulatory Visit: Payer: Self-pay | Admitting: Family Medicine

## 2024-01-01 MED ORDER — EPINEPHRINE 0.3 MG/0.3ML IJ SOAJ: 0.3000 mg | INTRAMUSCULAR | 1 refills | Status: AC | PRN

## 2024-01-01 NOTE — Progress Notes (Signed)
 Note from [facility] to change Epipen. Written for 2 pack will change to individual pack.   Meds ordered this encounter  Medications   EPINEPHrine 0.3 mg/0.3 mL IJ SOAJ injection    Sig: Inject 0.3 mg into the muscle as needed for anaphylaxis.    Dispense:  1 each    Refill:  1

## 2024-01-05 LAB — COMPREHENSIVE METABOLIC PANEL WITH GFR
ALT: 23 IU/L (ref 0–32)
AST: 20 IU/L (ref 0–40)
Albumin: 4 g/dL (ref 3.9–4.9)
Alkaline Phosphatase: 72 IU/L (ref 44–121)
BUN/Creatinine Ratio: 16 (ref 9–23)
BUN: 13 mg/dL (ref 6–24)
Bilirubin Total: 0.3 mg/dL (ref 0.0–1.2)
CO2: 23 mmol/L (ref 20–29)
Calcium: 9.1 mg/dL (ref 8.7–10.2)
Chloride: 106 mmol/L (ref 96–106)
Creatinine, Ser: 0.8 mg/dL (ref 0.57–1.00)
Globulin, Total: 2.2 g/dL (ref 1.5–4.5)
Glucose: 104 mg/dL — ABNORMAL HIGH (ref 70–99)
Potassium: 4.6 mmol/L (ref 3.5–5.2)
Sodium: 140 mmol/L (ref 134–144)
Total Protein: 6.2 g/dL (ref 6.0–8.5)
eGFR: 91 mL/min/{1.73_m2} (ref >59)

## 2024-01-05 LAB — MICROSCOPIC EXAMINATION
Bacteria, UA: NONE SEEN
Casts: NONE SEEN /LPF

## 2024-01-05 LAB — CBC
Hematocrit: 38 % (ref 34.0–46.6)
Hemoglobin: 12.7 g/dL (ref 11.1–15.9)
MCH: 30.8 pg (ref 26.6–33.0)
MCHC: 33.4 g/dL (ref 31.5–35.7)
MCV: 92 fL (ref 79–97)
Platelets: 269 10*3/uL (ref 150–450)
RBC: 4.12 x10E6/uL (ref 3.77–5.28)
RDW: 11.6 % — ABNORMAL LOW (ref 11.7–15.4)
WBC: 7.3 10*3/uL (ref 3.4–10.8)

## 2024-01-05 LAB — UA/M W/RFLX CULTURE, ROUTINE
Bilirubin, UA: NEGATIVE
Glucose, UA: NEGATIVE
Ketones, UA: NEGATIVE
Leukocytes,UA: NEGATIVE
Nitrite, UA: NEGATIVE
Protein,UA: NEGATIVE
RBC, UA: NEGATIVE
Specific Gravity, UA: 1.017 (ref 1.005–1.030)
Urobilinogen, Ur: 0.2 mg/dL (ref 0.2–1.0)
pH, UA: 7.5 (ref 5.0–7.5)

## 2024-01-05 LAB — CORTISOL, FREE: Cortisol, Free Dialysis, LCMS: 0.784 ug/dL

## 2024-01-10 ENCOUNTER — Encounter: Payer: Self-pay | Admitting: *Deleted

## 2024-01-10 ENCOUNTER — Encounter (INDEPENDENT_AMBULATORY_CARE_PROVIDER_SITE_OTHER): Payer: Self-pay | Admitting: Sports Medicine

## 2024-01-10 DIAGNOSIS — M533 Sacrococcygeal disorders, not elsewhere classified: Secondary | ICD-10-CM

## 2024-01-10 NOTE — Telephone Encounter (Signed)
 Please see the MyChart message reply(ies) for my assessment and plan.    This patient gave consent for this Medical Advice Message and is aware that it may result in a bill to [employer], as well as the possibility of receiving a bill for a co-payment or deductible. They are an established patient, but are not seeking medical advice exclusively about a problem treated during an in person or video visit in the last seven days. I did not recommend an in person or video visit within seven days of my reply.    I spent a total of 5 minutes cumulative time within 7 days through [employer].  Rodney Langton, MD

## 2024-01-10 NOTE — Telephone Encounter (Signed)
 Dr. Karie Schwalbe,  Please see mychart message sent by pt and advise.

## 2024-01-16 ENCOUNTER — Encounter: Payer: Self-pay | Admitting: Family Medicine

## 2024-01-16 DIAGNOSIS — F5101 Primary insomnia: Secondary | ICD-10-CM

## 2024-01-17 MED ORDER — TRAZODONE HCL 50 MG PO TABS: 25.0000 mg | ORAL_TABLET | Freq: Every day | ORAL | 1 refills | Status: DC

## 2024-01-19 ENCOUNTER — Encounter: Payer: Self-pay | Admitting: Family Medicine

## 2024-01-21 MED ORDER — DULOXETINE HCL 60 MG PO CPEP: 120.0000 mg | ORAL_CAPSULE | Freq: Every day | ORAL | 1 refills | Status: DC

## 2024-01-27 ENCOUNTER — Encounter: Payer: Self-pay | Admitting: Sports Medicine

## 2024-01-27 ENCOUNTER — Ambulatory Visit (INDEPENDENT_AMBULATORY_CARE_PROVIDER_SITE_OTHER): Admitting: Sports Medicine

## 2024-01-27 VITALS — BP 123/87 | HR 104 | Resp 20 | Ht 64.0 in | Wt 227.0 lb

## 2024-01-27 DIAGNOSIS — I951 Orthostatic hypotension: Secondary | ICD-10-CM | POA: Diagnosis not present

## 2024-01-27 NOTE — Progress Notes (Signed)
    Procedures performed today:    None.  Independent interpretation of notes and tests performed by another provider:   None.  Brief History, Exam, Impression, and Recommendations:    Orthostatic hypotension This is a very 48 year old female, she has done extremely well on her weight loss journey, she is down almost to 100 pounds. Status post bariatric surgery and currently on tirzepatide . I last saw her about a month ago with several weeks of increasing dizziness when going from supine to standing, she has fallen, she is also reported episodes of blacking out when voiding at night. At the last visit her pulse rate was elevated and went over 110 when going from supine to standing. We discussed the physiology with her, we asked her to hydrate aggressively, liberalize the salt in her diet, use salt containing beverages that were low in calories such as Gatorade G2. We also advised her to change position more slowly, today she reports some improvement in symptoms, she does note symptoms are worse after going on partying, I advised her that after night of drinking it would be crucial to consume a good amount of salt, Ramen, pretzels etc. She still has a large increase in her pulse rate when going from sitting to standing. She will get more consistent with lower extremity compression stockings. She will continue her weight loss journey. Hydrate aggressively as above. We also discussed the midodrine and fludrocortisone, at this point she will continue with lifestyle modification. Follow-up needs to be with PCP.    ____________________________________________ Joselyn Nicely. Sandy Crumb, M.D., ABFM., CAQSM., AME. Primary Care and Sports Medicine [facility]  Adjunct Professor of [facility] Medicine  [employer]  Restaurant manager, fast food

## 2024-01-27 NOTE — Assessment & Plan Note (Signed)
 This is a very 48 year old female, she has done extremely well on her weight loss journey, she is down almost to 100 pounds. Status post bariatric surgery and currently on tirzepatide . I last saw her about a month ago with several weeks of increasing dizziness when going from supine to standing, she has fallen, she is also reported episodes of blacking out when voiding at night. At the last visit her pulse rate was elevated and went over 110 when going from supine to standing. We discussed the physiology with her, we asked her to hydrate aggressively, liberalize the salt in her diet, use salt containing beverages that were low in calories such as Gatorade G2. We also advised her to change position more slowly, today she reports some improvement in symptoms, she does note symptoms are worse after going on partying, I advised her that after night of drinking it would be crucial to consume a good amount of salt, Ramen, pretzels etc. She still has a large increase in her pulse rate when going from sitting to standing. She will get more consistent with lower extremity compression stockings. She will continue her weight loss journey. Hydrate aggressively as above. We also discussed the midodrine and fludrocortisone, at this point she will continue with lifestyle modification. Follow-up needs to be with PCP.

## 2024-02-09 ENCOUNTER — Other Ambulatory Visit: Payer: Self-pay | Admitting: Family Medicine

## 2024-02-09 DIAGNOSIS — F902 Attention-deficit hyperactivity disorder, combined type: Secondary | ICD-10-CM

## 2024-02-10 MED ORDER — AMPHETAMINE-DEXTROAMPHET ER 20 MG PO CP24: 20.0000 mg | ORAL_CAPSULE | ORAL | 0 refills | Status: DC

## 2024-02-11 ENCOUNTER — Ambulatory Visit (INDEPENDENT_AMBULATORY_CARE_PROVIDER_SITE_OTHER): Admitting: Family Medicine

## 2024-02-11 ENCOUNTER — Encounter: Payer: Self-pay | Admitting: Family Medicine

## 2024-02-11 VITALS — BP 130/79 | HR 84 | Temp 98.4°F | Ht 64.0 in | Wt 232.5 lb

## 2024-02-11 DIAGNOSIS — J069 Acute upper respiratory infection, unspecified: Secondary | ICD-10-CM | POA: Diagnosis not present

## 2024-02-11 NOTE — Progress Notes (Signed)
   Acute Office Visit  Subjective:     Patient ID: Barbara Mcmahon, female    DOB: 07-15-1976, 48 y.o.   MRN: [id number]  Chief Complaint  Patient presents with   not feeling well    HPI Patient is in today for not feeling well.  She was traveling for work last week and starting around Thursday evening she started just feeling like she had more drainage in her ears and throat some sinus pressure and discomfort but sometimes she also gets migraines that feels like they are in her sinuses.  She has been running a low-grade fever on and off since then.  She was mostly concerned because she noticed an enlarged lymph node on the left side of her neck.  It was hard and tender initially at the end of last week but it has gotten a little softer and smaller since then  ROS      Objective:    BP 130/79   Pulse 84   Temp 98.4 F (36.9 C)   Ht 5\' 4"  (1.626 m)   Wt 232 lb 8 oz (105.5 kg)   SpO2 98%   BMI 39.91 kg/m    Physical Exam Constitutional:      Appearance: Normal appearance.  HENT:     Head: Normocephalic and atraumatic.     Right Ear: Tympanic membrane, ear canal and external ear normal. There is no impacted cerumen.     Left Ear: Tympanic membrane, ear canal and external ear normal. There is no impacted cerumen.     Nose: Nose normal.     Mouth/Throat:     Pharynx: Oropharynx is clear.  Eyes:     Conjunctiva/sclera: Conjunctivae normal.  Neck:     Comments: No concerning lymphadenopathy. Cardiovascular:     Rate and Rhythm: Normal rate and regular rhythm.  Pulmonary:     Effort: Pulmonary effort is normal.     Breath sounds: Normal breath sounds.  Musculoskeletal:     Cervical back: Neck supple. No tenderness.  Lymphadenopathy:     Cervical: No cervical adenopathy.  Skin:    General: Skin is warm and dry.  Neurological:     Mental Status: She is alert and oriented to person, place, and time.  Psychiatric:        Mood and Affect: Mood normal.     No results  found for any visits on 02/11/24.      Assessment & Plan:   Problem List Items Addressed This Visit   None Visit Diagnoses       Viral upper respiratory tract infection    -  Primary      You think this is more viral but if not improving or still running a fever in the next couple days then please let me know and we will treat for sinusitis if not better at that point.  Continue symptomatic care.  No orders of the defined types were placed in this encounter.   No follow-ups on file.  Duaine German, MD

## 2024-02-16 ENCOUNTER — Encounter: Payer: Self-pay | Admitting: Family Medicine

## 2024-02-19 MED ORDER — AMOXICILLIN-POT CLAVULANATE 875-125 MG PO TABS
1.0000 | ORAL_TABLET | Freq: Two times a day (BID) | ORAL | 0 refills | Status: DC
Start: 2024-02-19 — End: 2024-02-27

## 2024-02-19 NOTE — Telephone Encounter (Signed)
Meds ordered this encounter  ?Medications  ? amoxicillin-clavulanate (AUGMENTIN) 875-125 MG tablet  ?  Sig: Take 1 tablet by mouth 2 (two) times daily.  ?  Dispense:  14 tablet  ?  Refill:  0  ? ? ?

## 2024-02-20 ENCOUNTER — Other Ambulatory Visit: Payer: Self-pay | Admitting: Medical Genetics

## 2024-02-20 ENCOUNTER — Ambulatory Visit: Payer: Self-pay

## 2024-02-20 ENCOUNTER — Ambulatory Visit (INDEPENDENT_AMBULATORY_CARE_PROVIDER_SITE_OTHER): Admitting: Family Medicine

## 2024-02-20 ENCOUNTER — Encounter: Payer: Self-pay | Admitting: Family Medicine

## 2024-02-20 VITALS — BP 80/62 | Ht 64.0 in | Wt 228.0 lb

## 2024-02-20 DIAGNOSIS — N3000 Acute cystitis without hematuria: Secondary | ICD-10-CM | POA: Diagnosis not present

## 2024-02-20 DIAGNOSIS — R109 Unspecified abdominal pain: Secondary | ICD-10-CM

## 2024-02-20 DIAGNOSIS — N390 Urinary tract infection, site not specified: Secondary | ICD-10-CM | POA: Insufficient documentation

## 2024-02-20 DIAGNOSIS — R8290 Unspecified abnormal findings in urine: Secondary | ICD-10-CM | POA: Diagnosis not present

## 2024-02-20 DIAGNOSIS — R309 Painful micturition, unspecified: Secondary | ICD-10-CM | POA: Diagnosis not present

## 2024-02-20 LAB — POCT URINALYSIS DIP (CLINITEK)
Bilirubin, UA: NEGATIVE
Blood, UA: NEGATIVE
Glucose, UA: NEGATIVE mg/dL
Ketones, POC UA: NEGATIVE mg/dL
Leukocytes, UA: NEGATIVE
Nitrite, UA: POSITIVE — AB
POC PROTEIN,UA: NEGATIVE
Spec Grav, UA: 1.015 (ref 1.010–1.025)
Urobilinogen, UA: 0.2 U/dL
pH, UA: 7 (ref 5.0–8.0)

## 2024-02-20 MED ORDER — NITROFURANTOIN MONOHYD MACRO 100 MG PO CAPS: 100.0000 mg | ORAL_CAPSULE | Freq: Two times a day (BID) | ORAL | 0 refills | Status: DC

## 2024-02-20 NOTE — Progress Notes (Signed)
 Barbara Mcmahon - 48 y.o. female MRN [id number]  Date of birth: 1976/09/23  Subjective Chief Complaint  Patient presents with   Urinary Tract Infection    HPI Barbara Mcmahon is a 48 y.o. female here today with complaint dysuria and urgency along with some R sided flank pain.  She has not noted blood in her urine.  She denies fever, chills, nausea, vomiting.  She has use AZO with some relief of symptoms.  She is staying well hydrated.   ROS:  A comprehensive ROS was completed and negative except as noted per HPI  Allergies  Allergen Reactions   Cinnamon Shortness Of Breath and Rash    Wheezing    Morphine Hives, Itching and Rash    Other reaction(s): Other (See Comments) BEHAVIOR CHANGES Big mood changes irritable   Nutmeg Oil (Myristica Oil) Shortness Of Breath and Rash    wheezing Other reaction(s): Not available   Shellfish Allergy Anaphylaxis, Hives and Swelling    Throat closes and wheezing;   per pt shrimp only   Shrimp Extract     Other reaction(s): Wheezing Other reaction(s): wheezing   Codeine Hives, Itching and Rash    Pt says she can take percocet   Tramadol  Itching and Rash   Candida Albicans     Other reaction(s): Not available   Clonazepam  Itching   Hydrocodone  Hives   Menthol  Hives   Robitussin A-C [Guaifenesin -Codeine] Itching   Yeast Hives    Past Medical History:  Diagnosis Date   ADHD (attention deficit hyperactivity disorder)    Anticoagulated    eliquis ---  managed by dr Maria Shiner   DDD (degenerative disc disease), lumbosacral    Family history of hypertrophic cardiomyopathy    last echo in epic 02-24-2021  ef 55-60%   GAD (generalized anxiety disorder)    Generalized anxiety disorder    GERD (gastroesophageal reflux disease)    History of abnormal cervical Pap smear    History of febrile seizure    per pt as infant resolved when toddler   History of irritable bowel syndrome    when younger in college   History of methicillin resistant  staphylococcus aureus (MRSA) 2016   01/ 2016 abdominal wound;  02/ 2016  cellulitis/ abscess of leg   History of pulmonary embolism 07/04/2017   left lung (recent travel)  ;  takes eliquis ;   followed by dr Maria Shiner   Hx of adenomatous colonic polyps    Infertility, female    Menorrhagia    PCOS (polycystic ovarian syndrome)    PONV (postoperative nausea and vomiting)    Prediabetes    Wears contact lenses     Past Surgical History:  Procedure Laterality Date   BREAST BIOPSY Right 2019   benign   CHOLECYSTECTOMY, LAPAROSCOPIC  2003   COLONOSCOPY WITH PROPOFOL   11/30/2021   by  dr Brice Campi   DIAGNOSTIC LAPAROSCOPY  2011   by dr Serge Dancer;  for infertility   DILATION AND EVACUATION  03/12/2011   @[facility]   by dr Serge Dancer;   missed ab   DILITATION & CURRETTAGE/HYSTROSCOPY WITH HYDROTHERMAL ABLATION N/A 06/18/2022   Procedure: DILATATION & CURETTAGE/HYSTEROSCOPY WITH HYDROTHERMAL ABLATION;  Surgeon: Thurman Flores, MD;  Location: [facility] [place];  Service: Gynecology;  Laterality: N/A;   IUD REMOVAL N/A 06/18/2022   Procedure: INTRAUTERINE DEVICE (IUD) REMOVAL;  Surgeon: Thurman Flores, MD;  Location: [facility];  Service: Gynecology;  Laterality: N/A;   KNEE ARTHROSCOPY Left    2000  and  07/ 2023   LAPAROSCOPIC GASTRIC SLEEVE RESECTION WITH HIATAL HERNIA REPAIR  05/22/2021   @[facility]   by dr Zachery Hermes. wilson   LAPAROSCOPIC TUBAL LIGATION Bilateral 06/18/2022   Procedure: ATTEMPTED LAPAROSCOPY;  Surgeon: Thurman Flores, MD;  Location: [facility] [place];  Service: Gynecology;  Laterality: Bilateral;   UPPER GI ENDOSCOPY N/A 05/22/2021   Procedure: UPPER GI ENDOSCOPY;  Surgeon: Aldean Hummingbird, MD;  Location: [facility];  Service: General;  Laterality: N/A;    Social History   Socioeconomic History   Marital status: Married    Spouse name: Keyli Somer   Number of children: 2   Years of education: Not on file   Highest education level: Bachelor's degree (e.g., BA,  AB, BS)  Occupational History   Occupation: Publishing copy  Tobacco Use   Smoking status: Never   Smokeless tobacco: Never  Vaping Use   Vaping status: Never Used  Substance and Sexual Activity   Alcohol use: Not Currently    Comment: very rare   Drug use: Never   Sexual activity: Not Currently    Birth control/protection: I.U.D.  Other Topics Concern   Not on file  Social History Narrative   Not on file   Social Drivers of Health   Financial Resource Strain: Low Risk  (10/06/2023)   Overall Financial Resource Strain (CARDIA)    Difficulty of Paying Living Expenses: Not hard at all  Food Insecurity: No Food Insecurity (10/06/2023)   Hunger Vital Sign    Worried About Running Out of Food in the Last Year: Never true    Ran Out of Food in the Last Year: Never true  Transportation Needs: No Transportation Needs (10/06/2023)   PRAPARE - Administrator, Civil Service (Medical): No    Lack of Transportation (Non-Medical): No  Physical Activity: Insufficiently Active (10/06/2023)   Exercise Vital Sign    Days of Exercise per Week: 2 days    Minutes of Exercise per Session: 20 min  Stress: No Stress Concern Present (10/06/2023)   [employer] of Occupational Health - Occupational Stress Questionnaire    Feeling of Stress : Only a little  Recent Concern: Stress - Stress Concern Present (08/06/2023)   [employer] of Occupational Health - Occupational Stress Questionnaire    Feeling of Stress : To some extent  Social Connections: Socially Integrated (10/06/2023)   Social Connection and Isolation Panel [NHANES]    Frequency of Communication with Friends and Family: More than three times a week    Frequency of Social Gatherings with Friends and Family: Twice a week    Attends Religious Services: More than 4 times per year    Active Member of [employer] or Organizations: Yes    Attends Engineer, structural: More than 4 times per year     Marital Status: Married    Family History  Problem Relation Age of Onset   Hypertension Mother    Depression Mother    Anxiety disorder Mother    Eating disorder Mother    Rheum arthritis Mother    Hypertension Father    Depression Father    Anxiety disorder Father    Sleep apnea Father    Obesity Father    Rheum arthritis Father    Breast cancer Sister        Bilat   Colon cancer Paternal Aunt    Diabetes Paternal Grandmother    Hypertension Paternal Grandmother    Stroke Paternal Grandmother  Colon polyps Paternal Grandfather    Diabetes Paternal Grandfather    Hypertension Paternal Grandfather    Stroke Paternal Grandfather    Crohn's disease Cousin    Esophageal cancer Neg Hx    Stomach cancer Neg Hx    Rectal cancer Neg Hx     Health Maintenance  Topic Date Due   Hepatitis C Screening  Never done   Cervical Cancer Screening (HPV/Pap Cotest)  12/27/2023   INFLUENZA VACCINE  05/08/2024   Colonoscopy  11/30/2026   DTaP/Tdap/Td (3 - Td or Tdap) 02/01/2031   COVID-19 Vaccine  Completed   HIV Screening  Completed   HPV VACCINES  Aged Out   Meningococcal B Vaccine  Aged Out     ----------------------------------------------------------------------------------------------------------------------------------------------------------------------------------------------------------------- Physical Exam BP (!) 80/62 (Cuff Size: Large)   Ht 5\' 4"  (1.626 m)   Wt 228 lb (103.4 kg)   BMI 39.14 kg/m   Physical Exam Constitutional:      Appearance: Normal appearance.  Neurological:     Mental Status: She is alert.     ------------------------------------------------------------------------------------------------------------------------------------------------------------------------------------------------------------------- Assessment and Plan  UTI (urinary tract infection) No leukocytes but is currently taking augmentin .  Nitrate positive and with current  symptoms will cover with macrobid as augmentin  may not provide adequate coverage. She has fluconazole  at home to use if needed for yeast vaginitis after antibiotics.  Red flags reviewed.  Contact clinic if not improving.    Meds ordered this encounter  Medications   nitrofurantoin, macrocrystal-monohydrate, (MACROBID) 100 MG capsule    Sig: Take 1 capsule (100 mg total) by mouth 2 (two) times daily.    Dispense:  5 capsule    Refill:  0    No follow-ups on file.

## 2024-02-20 NOTE — Assessment & Plan Note (Signed)
 No leukocytes but is currently taking augmentin .  Nitrate positive and with current symptoms will cover with macrobid as augmentin  may not provide adequate coverage. She has fluconazole  at home to use if needed for yeast vaginitis after antibiotics.  Red flags reviewed.  Contact clinic if not improving.

## 2024-02-20 NOTE — Telephone Encounter (Signed)
  Chief Complaint: urinary symptoms Symptoms: burning with  urination, right flank/back pain Frequency: x 3-4 days Pertinent Negatives: Patient denies fever, nausea, vomiting Disposition: [] ED /[] Urgent Care (no appt availability in office) / [x] Appointment(In office/virtual)/ []  [facility]/ [] Home Care/ [] Refused Recommended Disposition /[] [facility]/ []  Follow-up with PCP Additional Notes: Patient states she thought it was a yeast infection at first so she took diflucan  which didn't improve symptoms. She states she did get some relief from taking AZO last night. No appts with PCP today, offered morning appts to patient and she states she doesn't think she could make it up to the office that early. Patient agreeable to visit this afternoon with  Barbara Mcmahon.  Copied from CRM [id number]. Topic: Clinical - Red Word Triage >> Feb 20, 2024  7:38 AM Barbara Mcmahon wrote: Barbara Mcmahon that prompted transfer to Nurse Triage: pain around the right kidney area and lower back pain. Reason for Disposition  Side (flank) or lower back pain present  Answer Assessment - Initial Assessment Questions 1. SEVERITY: "How bad is the pain?"  (e.g., Scale 1-10; mild, moderate, or severe)   - MILD (1-3): complains slightly about urination hurting   - MODERATE (4-7): interferes with normal activities     - SEVERE (8-10): excruciating, unwilling or unable to urinate because of the pain      Mild.  2. FREQUENCY: "How many times have you had painful urination today?"      3 times.  3. PATTERN: "Is pain present every time you urinate or just sometimes?"      Every time.  4. ONSET: "When did the painful urination start?"      X 3-4 days.  5. FEVER: "Do you have a fever?" If Yes, ask: "What is your temperature, how was it measured, and when did it start?"     Denies.  6. PAST UTI: "Have you had a urine infection before?" If Yes, ask: "When was the last time?" and "What happened that time?"      Yes,  12 years ago.  7. CAUSE: "What do you think is causing the painful urination?"  (e.g., UTI, scratch, Herpes sore)     UTI.  8. OTHER SYMPTOMS: "Do you have any other symptoms?" (e.g., blood in urine, flank pain, genital sores, urgency, vaginal discharge)     Right flank pain.  9. PREGNANCY: "Is there any chance you are pregnant?" "When was your last menstrual period?"     No menstrual period due to uterine ablation and birth control pill.  Protocols used: Urination Pain - Female-A-AH

## 2024-02-24 LAB — URINE CULTURE

## 2024-02-25 ENCOUNTER — Inpatient Hospital Stay: Payer: 59

## 2024-02-25 ENCOUNTER — Inpatient Hospital Stay: Payer: 59 | Admitting: Hematology & Oncology

## 2024-02-26 ENCOUNTER — Inpatient Hospital Stay (HOSPITAL_BASED_OUTPATIENT_CLINIC_OR_DEPARTMENT_OTHER): Admitting: Hematology & Oncology

## 2024-02-26 ENCOUNTER — Inpatient Hospital Stay: Attending: Family

## 2024-02-26 ENCOUNTER — Encounter: Payer: Self-pay | Admitting: Hematology & Oncology

## 2024-02-26 VITALS — BP 86/40 | HR 99 | Temp 98.9°F | Resp 20 | Ht 64.0 in | Wt 223.0 lb

## 2024-02-26 DIAGNOSIS — I951 Orthostatic hypotension: Secondary | ICD-10-CM | POA: Diagnosis not present

## 2024-02-26 DIAGNOSIS — E282 Polycystic ovarian syndrome: Secondary | ICD-10-CM

## 2024-02-26 DIAGNOSIS — R634 Abnormal weight loss: Secondary | ICD-10-CM | POA: Insufficient documentation

## 2024-02-26 DIAGNOSIS — Z86711 Personal history of pulmonary embolism: Secondary | ICD-10-CM | POA: Diagnosis not present

## 2024-02-26 DIAGNOSIS — Z7901 Long term (current) use of anticoagulants: Secondary | ICD-10-CM | POA: Diagnosis not present

## 2024-02-26 LAB — CBC WITH DIFFERENTIAL (CANCER CENTER ONLY)
Abs Immature Granulocytes: 0.01 10*3/uL (ref 0.00–0.07)
Basophils Absolute: 0.1 10*3/uL (ref 0.0–0.1)
Basophils Relative: 1 %
Eosinophils Absolute: 0 10*3/uL (ref 0.0–0.5)
Eosinophils Relative: 1 %
HCT: 35.3 % — ABNORMAL LOW (ref 36.0–46.0)
Hemoglobin: 12.2 g/dL (ref 12.0–15.0)
Immature Granulocytes: 0 %
Lymphocytes Relative: 38 %
Lymphs Abs: 2.8 10*3/uL (ref 0.7–4.0)
MCH: 31.4 pg (ref 26.0–34.0)
MCHC: 34.6 g/dL (ref 30.0–36.0)
MCV: 91 fL (ref 80.0–100.0)
Monocytes Absolute: 0.5 10*3/uL (ref 0.1–1.0)
Monocytes Relative: 7 %
Neutro Abs: 3.9 10*3/uL (ref 1.7–7.7)
Neutrophils Relative %: 53 %
Platelet Count: 287 10*3/uL (ref 150–400)
RBC: 3.88 MIL/uL (ref 3.87–5.11)
RDW: 12.3 % (ref 11.5–15.5)
WBC Count: 7.3 10*3/uL (ref 4.0–10.5)
nRBC: 0 % (ref 0.0–0.2)

## 2024-02-26 LAB — CMP (CANCER CENTER ONLY)
ALT: 17 U/L (ref 0–44)
AST: 17 U/L (ref 15–41)
Albumin: 4.3 g/dL (ref 3.5–5.0)
Alkaline Phosphatase: 64 U/L (ref 38–126)
Anion gap: 8 (ref 5–15)
BUN: 9 mg/dL (ref 6–20)
CO2: 26 mmol/L (ref 22–32)
Calcium: 9.4 mg/dL (ref 8.9–10.3)
Chloride: 106 mmol/L (ref 98–111)
Creatinine: 0.71 mg/dL (ref 0.44–1.00)
GFR, Estimated: >60 mL/min (ref >60)
Glucose, Bld: 117 mg/dL — ABNORMAL HIGH (ref 70–99)
Potassium: 4.2 mmol/L (ref 3.5–5.1)
Sodium: 140 mmol/L (ref 135–145)
Total Bilirubin: 0.5 mg/dL (ref 0.0–1.2)
Total Protein: 6.6 g/dL (ref 6.5–8.1)

## 2024-02-26 NOTE — Progress Notes (Signed)
 Hematology and Oncology Follow Up Visit  Barbara Mcmahon [id number] April 04, 1976 48 y.o. 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 twice daily-complete 1 year of therapy in September 2019 Eilquis 2.5 mg po BID - start 06/08/2018     Interim History:  Barbara Mcmahon is back for follow-up.  She looks fantastic.  She continues to lose weight with the Zepbound .  Her weight is now down to 223 pounds..  She feels good.  She has had problems with orthostatic hypotension.  I am unsure as to why this is the case.  Her labs do not  look all that bad.  It is possible that she may need to be seen by Cardiology.  I am sure that her family doctor will be able to handle this.  Otherwise, she has been quite busy with traveling.  She quite busy at work.  She has been traveling quite a bit.  She has had no problems with bowels or bladder.  She has had no bleeding.  Has been no cough or shortness of breath.  There have been no rashes.  She has not noted any fever.  There has been no issues with nausea or vomiting.  She has had no ringing in the ears.  Overall, I would say her performance status is probably ECOG 0. .    Medications:  Current Outpatient Medications:    amoxicillin -clavulanate (AUGMENTIN ) 875-125 MG tablet, Take 1 tablet by mouth 2 (two) times daily., Disp: 14 tablet, Rfl: 0   amphetamine -dextroamphetamine  (ADDERALL XR) 20 MG 24 hr capsule, Take 1 capsule (20 mg total) by mouth every morning., Disp: 30 capsule, Rfl: 0   Atogepant (QULIPTA) 30 MG TABS, daily., Disp: , Rfl:    cyproheptadine (PERIACTIN) 4 MG tablet, Take 4 mg by mouth 2 (two) times daily., Disp: , Rfl:    DULoxetine  (CYMBALTA ) 60 MG capsule, Take 2 capsules (120 mg total) by mouth daily., Disp: 180 capsule, Rfl: 1   ELIQUIS  5 MG TABS tablet, TAKE 1 TABLET BY MOUTH TWICE DAILY, Disp: 180 tablet, Rfl: 1   KLAYESTA  powder, Apply  topically daily., Disp: , Rfl:    Multiple Vitamins-Minerals (BARIATRIC MULTIVITAMINS/IRON PO), Take 1 tablet by mouth 2 (two) times daily., Disp: , Rfl:    pregabalin  (LYRICA ) 50 MG capsule, Take 1 capsule (50 mg total) by mouth 3 (three) times daily. (Patient taking differently: Take 50 mg by mouth 2 (two) times daily.), Disp: 270 capsule, Rfl: 3   SLYND 4 MG TABS, Take 1 tablet by mouth daily., Disp: , Rfl:    tirzepatide  (ZEPBOUND ) 10 MG/0.5ML Pen, Inject 10 mg into the skin once a week., Disp: 6 mL, Rfl: 1   traZODone  (DESYREL ) 50 MG tablet, Take 0.5-1 tablets (25-50 mg total) by mouth at bedtime., Disp: 30 tablet, Rfl: 1   EPINEPHrine  0.3 mg/0.3 mL IJ SOAJ injection, Inject 0.3 mg into the muscle as needed for anaphylaxis. (Patient not taking: Reported on 02/26/2024), Disp: 1 each, Rfl: 1   ondansetron  (ZOFRAN ) 4 MG tablet, Take 1 tablet (4 mg total) by mouth every 8 (eight) hours as needed for nausea or vomiting. (Patient not taking: Reported on 02/26/2024), Disp: 20 tablet, Rfl: 0   rizatriptan  (MAXALT -MLT) 10 MG disintegrating tablet, Take 10 mg by mouth as needed for migraine. (Patient not taking: Reported on 02/26/2024), Disp: , Rfl:   Allergies:  Allergies  Allergen Reactions   Cinnamon  Shortness Of Breath and Rash    Wheezing    Morphine Hives, Itching and Rash    Other reaction(s): Other (See Comments) BEHAVIOR CHANGES Big mood changes irritable   Nutmeg Oil (Myristica Oil) Shortness Of Breath and Rash    wheezing Other reaction(s): Not available   Shellfish Allergy Anaphylaxis, Hives and Swelling    Throat closes and wheezing;   per pt shrimp only   Shrimp Extract     Other reaction(s): Wheezing Other reaction(s): wheezing   Codeine Hives, Itching and Rash    Pt says she can take percocet   Tramadol  Itching and Rash   Candida Albicans     Other reaction(s): Not available   Clonazepam  Itching   Hydrocodone  Hives   Menthol  Hives   Robitussin A-C [Guaifenesin -Codeine]  Itching   Yeast Hives    Past Medical History, Surgical history, Social history, and Family History were reviewed and updated.  Review of Systems: Review of Systems  Constitutional: Negative.   HENT:  Negative.    Eyes: Negative.   Respiratory: Negative.    Cardiovascular: Negative.   Gastrointestinal: Negative.   Endocrine: Negative.   Genitourinary: Negative.    Musculoskeletal: Negative.   Skin: Negative.   Neurological: Negative.   Hematological: Negative.   Psychiatric/Behavioral: Negative.      Physical Exam:  Temperature is 98.9.  Pulse is 99.  Blood pressure 86/40.  Weight is 223 pounds.  Wt Readings from Last 3 Encounters:  02/26/24 223 lb (101.2 kg)  02/20/24 228 lb (103.4 kg)  02/11/24 232 lb 8 oz (105.5 kg)    Physical Exam Vitals reviewed.  HENT:     Head: Normocephalic and atraumatic.  Eyes:     Pupils: Pupils are equal, round, and reactive to light.  Cardiovascular:     Rate and Rhythm: Normal rate and regular rhythm.     Heart sounds: Normal heart sounds.  Pulmonary:     Effort: Pulmonary effort is normal.     Breath sounds: Normal breath sounds.  Abdominal:     General: Bowel sounds are normal.     Palpations: Abdomen is soft.  Musculoskeletal:        General: No tenderness or deformity. Normal range of motion.     Cervical back: Normal range of motion.  Lymphadenopathy:     Cervical: No cervical adenopathy.  Skin:    General: Skin is warm and dry.     Findings: No erythema or rash.  Neurological:     Mental Status: She is alert and oriented to person, place, and time.  Psychiatric:        Behavior: Behavior normal.        Thought Content: Thought content normal.        Judgment: Judgment normal.     Lab Results  Component Value Date   WBC 7.3 02/26/2024   HGB 12.2 02/26/2024   HCT 35.3 (L) 02/26/2024   MCV 91.0 02/26/2024   PLT 287 02/26/2024     Chemistry      Component Value Date/Time   NA 140 12/30/2023 1028   NA 140  10/11/2017 1122   K 4.6 12/30/2023 1028   K 4.2 10/11/2017 1122   CL 106 12/30/2023 1028   CL 103 10/11/2017 1122   CO2 23 12/30/2023 1028   CO2 29 10/11/2017 1122   BUN 13 12/30/2023 1028   BUN 10 10/11/2017 1122   CREATININE 0.80 12/30/2023 1028   CREATININE 0.80 08/27/2023 0825  CREATININE 0.71 12/22/2020 0930      Component Value Date/Time   CALCIUM  9.1 12/30/2023 1028   CALCIUM  8.7 10/11/2017 1122   ALKPHOS 72 12/30/2023 1028   ALKPHOS 83 10/11/2017 1122   AST 20 12/30/2023 1028   AST 29 08/27/2023 0825   ALT 23 12/30/2023 1028   ALT 34 08/27/2023 0825   ALT 36 10/11/2017 1122   BILITOT 0.3 12/30/2023 1028   BILITOT 0.5 08/27/2023 0825      Impression and Plan: Ms. Wishon is a 48 year-old white female.  She has polycystic ovaries.  She had a pulmonary embolism in September 2018.  This was idiopathic.  We  have her on Eliquis .  She likes to be on low-dose Eliquis .  She does travel for work.  She just feels more comfortable.  Again, I am not sure why she has this orthostatic hypotension.  I do not know if some of this weight loss might be a problem.  Again, she looks a whole lot better which is wonderful to see.  For right now, we will just get her back in 6 months.    Ivor Mars, MD 5/21/20253:19 PM

## 2024-02-27 ENCOUNTER — Encounter: Payer: Self-pay | Admitting: Family Medicine

## 2024-02-27 ENCOUNTER — Ambulatory Visit: Payer: Self-pay | Admitting: Family Medicine

## 2024-02-27 ENCOUNTER — Ambulatory Visit (INDEPENDENT_AMBULATORY_CARE_PROVIDER_SITE_OTHER): Admitting: Family Medicine

## 2024-02-27 VITALS — BP 116/71 | HR 96 | Ht 64.0 in | Wt 221.8 lb

## 2024-02-27 DIAGNOSIS — I7300 Raynaud's syndrome without gangrene: Secondary | ICD-10-CM

## 2024-02-27 DIAGNOSIS — I951 Orthostatic hypotension: Secondary | ICD-10-CM

## 2024-02-27 DIAGNOSIS — F902 Attention-deficit hyperactivity disorder, combined type: Secondary | ICD-10-CM

## 2024-02-27 DIAGNOSIS — E88819 Insulin resistance, unspecified: Secondary | ICD-10-CM | POA: Diagnosis not present

## 2024-02-27 DIAGNOSIS — Z148 Genetic carrier of other disease: Secondary | ICD-10-CM

## 2024-02-27 MED ORDER — AMPHETAMINE-DEXTROAMPHET ER 20 MG PO CP24: 20.0000 mg | ORAL_CAPSULE | ORAL | 0 refills | Status: DC

## 2024-02-27 NOTE — Progress Notes (Signed)
 Established Patient Office Visit  Subjective  Patient ID: Barbara Mcmahon, female    DOB: October 25, 1975  Age: 48 y.o. MRN: [id number]  Chief Complaint  Patient presents with   Weight Check    HPI  Here for follow-up weight management-currently on Zepbound  10 mg and still going strong and doing well.  She actually just followed up with Dr. Maria Shiner yesterday guards to history of pulmonary embolism.  He was concerned is that she has been having some significant orthostasis.  Due [facility] with [facility].  She is also potentially interested in having surgery when she reaches a goal weight.  She has had large breasts since she was a young teenager when she was a double D and a size 2.  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  She would also like to consider having excess tissue removed underneath her arms as she now has more loose skin with weight loss.  And she is also has a pannus that folds and she again has to do diligent skin care to reduce risk for infection and would like to have that addressed as well.    ROS    Objective:     BP 116/71   Pulse 96   Ht 5\' 4"  (1.626 m)   Wt 221 lb 12.8 oz (100.6 kg)   SpO2 100%   BMI 38.07 kg/m    Physical Exam Vitals and nursing note reviewed.  Constitutional:      Appearance: Normal appearance.  HENT:     Head: Normocephalic and atraumatic.  Eyes:     Conjunctiva/sclera: Conjunctivae normal.  Cardiovascular:     Rate and Rhythm: Normal rate and regular rhythm.  Pulmonary:     Effort: Pulmonary effort is normal.     Breath sounds: Normal breath sounds.  Skin:    General: Skin is warm and dry.  Neurological:     Mental Status: She is alert.  Psychiatric:        Mood and Affect: Mood  normal.     No results found for any visits on 02/27/24.    The 10-year ASCVD risk score (Arnett DK, et al., 2019) is: 1.1%    Assessment & Plan:   Problem List Items Addressed This Visit       Cardiovascular and Mediastinum   Raynaud disease (Chronic)   She has noticed that her Raynaud's has been flaring a little bit more especially as she is lost more weight she just feels cold at times.  Will check TSH today.      Orthostatic hypotension - Primary   Blood pressure actually looks better today than it did yesterday.  I think a lot of this is related to the significant weight loss.  Just really encouraged her to stay well-hydrated she says when she travels she is actually pretty consistent it is more when she is home and she gets distracted and is not as consistent okay to liberalize salt.  We discussed that if it becomes more frequent or bothersome or does not improve as her weight stabilizes then we can always consult cardiology for further workup.      Relevant Orders   Lipid panel   TSH + free T4  Endocrine   Insulin  resistance   She is really doing phenomenally well!  Visit #: 8 Starting Weight: 285 lbs   Current weight:   221 lbs  Previous weight: 252 lbs  Change in weight form last OV: down 31 lbs  Goal weight: She does not have a specific goal in mind I think originally she was thinking around 213. Dietary goals: Getting in100 g protein daily, work on vegetables . Has cut out sweets.  Continue to work on adequate water intake. Exercise goals: Trying to get in short walks and moving more.  Medication: Continue Zepbound  10mg   Zofran  for the nausea prn  Follow-up and referrals: 10 weeks          Relevant Orders   Lipid panel   TSH + free T4     Other   Attention deficit hyperactivity disorder (ADHD), combined type   Relevant Medications   amphetamine -dextroamphetamine  (ADDERALL XR) 20 MG 24 hr capsule (Start on 03/12/2024)    We did discuss that when  she gets to a steady state with her weight and is feeling good and comfortable that she would be a great candidate for breast reduction, panniculectomy and removal of excess tissue from the arms.  Return in about 2 months (around 04/28/2024) for Weight Mgt .    Duaine German, MD

## 2024-02-27 NOTE — Assessment & Plan Note (Signed)
 Blood pressure actually looks better today than it did yesterday.  I think a lot of this is related to the significant weight loss.  Just really encouraged her to stay well-hydrated she says when she travels she is actually pretty consistent it is more when she is home and she gets distracted and is not as consistent okay to liberalize salt.  We discussed that if it becomes more frequent or bothersome or does not improve as her weight stabilizes then we can always consult cardiology for further workup.

## 2024-02-27 NOTE — Assessment & Plan Note (Addendum)
 She is really doing phenomenally well!  Visit #: 8 Starting Weight: 285 lbs   Current weight:   221 lbs  Previous weight: 252 lbs  Change in weight form last OV: down 31 lbs  Goal weight: She does not have a specific goal in mind I think originally she was thinking around 213. Dietary goals: Getting in100 g protein daily, work on vegetables . Has cut out sweets.  Continue to work on adequate water intake. Exercise goals: Trying to get in short walks and moving more.  Medication: Continue Zepbound  10mg   Zofran  for the nausea prn  Follow-up and referrals: 10 weeks

## 2024-02-27 NOTE — Assessment & Plan Note (Signed)
 She has noticed that her Raynaud's has been flaring a little bit more especially as she is lost more weight she just feels cold at times.  Will check TSH today.

## 2024-02-28 ENCOUNTER — Ambulatory Visit: Payer: Self-pay | Admitting: Family Medicine

## 2024-02-28 LAB — LIPID PANEL
Chol/HDL Ratio: 3.2 ratio (ref 0.0–4.4)
Cholesterol, Total: 156 mg/dL (ref 100–199)
HDL: 49 mg/dL (ref >39)
LDL Chol Calc (NIH): 92 mg/dL (ref 0–99)
Triglycerides: 81 mg/dL (ref 0–149)
VLDL Cholesterol Cal: 15 mg/dL (ref 5–40)

## 2024-02-28 LAB — TSH+FREE T4
Free T4: 1.1 ng/dL (ref 0.82–1.77)
TSH: 1.66 u[IU]/mL (ref 0.450–4.500)

## 2024-02-28 NOTE — Progress Notes (Signed)
 Your lab work is within acceptable range and there are no concerning findings.   ?

## 2024-03-23 ENCOUNTER — Encounter: Payer: Self-pay | Admitting: Family Medicine

## 2024-03-23 DIAGNOSIS — F902 Attention-deficit hyperactivity disorder, combined type: Secondary | ICD-10-CM

## 2024-03-30 ENCOUNTER — Other Ambulatory Visit: Payer: Self-pay | Admitting: Hematology & Oncology

## 2024-03-30 DIAGNOSIS — R0602 Shortness of breath: Secondary | ICD-10-CM

## 2024-04-17 ENCOUNTER — Other Ambulatory Visit: Payer: Self-pay | Admitting: Obstetrics and Gynecology

## 2024-04-17 DIAGNOSIS — Z1231 Encounter for screening mammogram for malignant neoplasm of breast: Secondary | ICD-10-CM

## 2024-04-27 ENCOUNTER — Encounter: Payer: Self-pay | Admitting: Family Medicine

## 2024-04-27 DIAGNOSIS — F5101 Primary insomnia: Secondary | ICD-10-CM

## 2024-04-27 DIAGNOSIS — E88819 Insulin resistance, unspecified: Secondary | ICD-10-CM

## 2024-04-27 DIAGNOSIS — F902 Attention-deficit hyperactivity disorder, combined type: Secondary | ICD-10-CM

## 2024-04-27 LAB — HM PAP SMEAR: HPV, high-risk: NEGATIVE

## 2024-04-28 MED ORDER — AMPHETAMINE-DEXTROAMPHET ER 20 MG PO CP24: 20.0000 mg | ORAL_CAPSULE | ORAL | 0 refills | Status: DC

## 2024-04-28 MED ORDER — ZEPBOUND 10 MG/0.5ML ~~LOC~~ SOAJ: 10.0000 mg | SUBCUTANEOUS | 1 refills | Status: DC

## 2024-04-28 MED ORDER — AMPHETAMINE-DEXTROAMPHET ER 20 MG PO CP24
20.0000 mg | ORAL_CAPSULE | ORAL | 0 refills | Status: AC
Start: 2024-06-23 — End: ?

## 2024-04-28 MED ORDER — TRAZODONE HCL 50 MG PO TABS: 25.0000 mg | ORAL_TABLET | Freq: Every day | ORAL | 1 refills | Status: DC

## 2024-04-30 ENCOUNTER — Encounter: Payer: Self-pay | Admitting: Obstetrics and Gynecology

## 2024-04-30 ENCOUNTER — Ambulatory Visit (INDEPENDENT_AMBULATORY_CARE_PROVIDER_SITE_OTHER): Admitting: Family Medicine

## 2024-04-30 ENCOUNTER — Encounter: Payer: Self-pay | Admitting: Family Medicine

## 2024-04-30 VITALS — BP 116/49 | HR 103 | Ht 64.0 in | Wt 217.1 lb

## 2024-04-30 DIAGNOSIS — E282 Polycystic ovarian syndrome: Secondary | ICD-10-CM | POA: Diagnosis not present

## 2024-04-30 DIAGNOSIS — R109 Unspecified abdominal pain: Secondary | ICD-10-CM | POA: Diagnosis not present

## 2024-04-30 DIAGNOSIS — E88819 Insulin resistance, unspecified: Secondary | ICD-10-CM | POA: Diagnosis not present

## 2024-04-30 NOTE — Assessment & Plan Note (Addendum)
 She is really doing phenomenally well!   Visit #: 9 Starting Weight: 285 lbs   Current weight:  217 lbs  Previous weight: 221 lbs  Change in weight form last OV: down 4 lbs  Goal weight: She does not have a specific goal in mind I think originally she was thinking around 213. Dietary goals: Getting in100 g protein daily, work on vegetables . Has cut out sweets.  Continue to work on adequate water intake. Exercise goals: Trying to get in short walks and doing some resistance training Medication: Continue Zepbound  10mg   Zofran  for the nausea prn  Follow-up and referrals: 10 weeks

## 2024-04-30 NOTE — Progress Notes (Signed)
 Established Patient Office Visit  Subjective  Patient ID: Barbara Mcmahon, female    DOB: 1976-04-16  Age: 48 y.o. MRN: [id number]  Chief Complaint  Patient presents with   Weight Check    HPI  Here for follow-up impaired fasting glucose/PCOS currently on a GLP-1.  She is actually doing really well overall.  Since I last saw her she did get let go from her job they really did some major downsizing and unfortunately one of the positions that cut cut was hers.  She is already got feelers out she is already had 2 job interviews so she is actually feeling pretty positive about the whole situation and she has been able to spend a little bit more time with her kids which has been great.  We just recently refilled her Adderall and she is doing well with that with no concerns.  She is currently on Zepbound  10 mg and happy with her current regimen she is worried if she goes up show does not want to eat at all.  Her weights been ranging between 210 and 214.   Has had some intermittent pain in the left lateral abdomen almost toward the hip.  She says its almost daily it does not stop her from doing anything she is not sure if it is actually her hip or maybe her IBS flaring a little bit especially with a lot of the dietary changes.  She has been taking a chewable Colace nightly to help keep her bowels moving regularly.  She has been doing some walking and trying to add in a little bit of resistance training here and there.     ROS    Objective:     BP (!) 116/49   Pulse (!) 103   Ht 5' 4 (1.626 m)   Wt 217 lb 1.6 oz (98.5 kg)   SpO2 100%   BMI 37.27 kg/m    Physical Exam Vitals and nursing note reviewed.  Constitutional:      Appearance: Normal appearance.  HENT:     Head: Normocephalic and atraumatic.  Eyes:     Conjunctiva/sclera: Conjunctivae normal.  Cardiovascular:     Rate and Rhythm: Normal rate and regular rhythm.  Pulmonary:     Effort: Pulmonary effort is normal.      Breath sounds: Normal breath sounds.  Skin:    General: Skin is warm and dry.  Neurological:     Mental Status: She is alert.  Psychiatric:        Mood and Affect: Mood normal.      No results found for any visits on 04/30/24.    The 10-year ASCVD risk score (Arnett DK, et al., 2019) is: 0.8%    Assessment & Plan:   Problem List Items Addressed This Visit       Endocrine   PCOS (polycystic ovarian syndrome) - Primary (Chronic)   Insulin  resistance   She is really doing phenomenally well!   Visit #: 9 Starting Weight: 285 lbs   Current weight:  217 lbs  Previous weight: 221 lbs  Change in weight form last OV: down 4 lbs  Goal weight: She does not have a specific goal in mind I think originally she was thinking around 213. Dietary goals: Getting in100 g protein daily, work on vegetables . Has cut out sweets.  Continue to work on adequate water intake. Exercise goals: Trying to get in short walks and doing some resistance training Medication: Continue Zepbound  10mg   Zofran   for the nausea prn  Follow-up and referrals: 10 weeks        Other Visit Diagnoses       Left sided abdominal pain           We will call for copy of recent Pap smear.  She does have her mammogram scheduled for the 30th of this month.  Left lateral abdominal pain and was towards the hip we did discuss that it could be coming from her back even though she really has not had any back pain since her weight loss surgery that is actually done really well.  Also keeping the bowels moving more regularly could be helpful as well especially if her IBS is starting to flare back up.  She does have the genetic testing offered through call and scheduled as well.  Return in about 2 months (around 07/01/2024) for weight mgt.    Dorothyann Byars, MD

## 2024-05-06 ENCOUNTER — Other Ambulatory Visit (HOSPITAL_COMMUNITY)
Admission: RE | Admit: 2024-05-06 | Discharge: 2024-05-06 | Disposition: A | Discharge status: Home / Self Care | Payer: Self-pay | Source: Ambulatory Visit | Attending: Medical Genetics | Admitting: Medical Genetics

## 2024-05-06 ENCOUNTER — Ambulatory Visit
Admission: RE | Admit: 2024-05-06 | Discharge: 2024-05-06 | Disposition: A | Discharge status: Home / Self Care | Source: Ambulatory Visit | Attending: Obstetrics and Gynecology | Admitting: Obstetrics and Gynecology

## 2024-05-06 DIAGNOSIS — Z1231 Encounter for screening mammogram for malignant neoplasm of breast: Secondary | ICD-10-CM

## 2024-05-08 ENCOUNTER — Other Ambulatory Visit: Payer: Self-pay | Admitting: *Deleted

## 2024-05-08 DIAGNOSIS — M533 Sacrococcygeal disorders, not elsewhere classified: Secondary | ICD-10-CM

## 2024-05-08 MED ORDER — PREGABALIN 50 MG PO CAPS: 50.0000 mg | ORAL_CAPSULE | Freq: Two times a day (BID) | ORAL | 3 refills | Status: AC

## 2024-05-08 NOTE — Telephone Encounter (Signed)
 Pt requesting refill

## 2024-05-17 LAB — GENECONNECT MOLECULAR SCREEN: Genetic Analysis Overall Interpretation: NEGATIVE

## 2024-06-09 ENCOUNTER — Encounter: Payer: Self-pay | Admitting: Sports Medicine

## 2024-06-16 ENCOUNTER — Encounter: Payer: Self-pay | Admitting: Family Medicine

## 2024-06-22 ENCOUNTER — Telehealth: Admitting: Family Medicine

## 2024-06-22 ENCOUNTER — Ambulatory Visit: Payer: Self-pay

## 2024-06-22 DIAGNOSIS — U071 COVID-19: Secondary | ICD-10-CM

## 2024-06-22 MED ORDER — BENZONATATE 100 MG PO CAPS: 100.0000 mg | ORAL_CAPSULE | Freq: Three times a day (TID) | ORAL | 0 refills | Status: DC | PRN

## 2024-06-22 MED ORDER — PROMETHAZINE-DM 6.25-15 MG/5ML PO SYRP: 5.0000 mL | ORAL_SOLUTION | Freq: Four times a day (QID) | ORAL | 0 refills | Status: DC | PRN

## 2024-06-22 NOTE — Telephone Encounter (Signed)
 Would like to discuss tx options. No in office live or virtual available, scheduled virtual UC. ED precautions reviewed, pt verbalized understanding.  FYI Only or Action Required?: FYI only for provider.  Patient was last seen in primary care on 04/30/2024 by Alvan Dorothyann BIRCH, MD.  Called Nurse Triage reporting Covid Positive.  Symptoms began several days ago.  Interventions attempted: Rest, hydration, or home remedies.  Symptoms are: gradually worsening.  Triage Disposition: See Physician Within 24 Hours (overriding Home Care) - via virtual UC visit  Patient/caregiver understands and will follow disposition?: Yes  Reason for Disposition  [1] COVID-19 diagnosed by positive lab test (e.g., PCR, rapid self-test kit) AND [2] mild symptoms (e.g., cough, fever, others) AND [3] no complications or SOB  Answer Assessment - Initial Assessment Questions 1. COVID-19 DIAGNOSIS: How do you know that you have COVID? (e.g., positive lab test or self-test, diagnosed by doctor or NP/PA, symptoms after exposure).     Tested positive for covid today  3. ONSET: When did the COVID-19 symptoms start?      Symptoms began 06/13/2024  4. WORST SYMPTOM: What is your worst symptom? (e.g., cough, fever, shortness of breath, muscle aches)     Drainage and headaches  5. COUGH: Do you have a cough? If Yes, ask: How bad is the cough?       Dry cough  6. FEVER: Do you have a fever? If Yes, ask: What is your temperature, how was it measured, and when did it start?     Unknown  7. RESPIRATORY STATUS: Describe your breathing? (e.g., normal; shortness of breath, wheezing, unable to speak)      Denies  Protocols used: Coronavirus (COVID-19) Diagnosed or Suspected-A-AH Copied from CRM #[id number]. Topic: Clinical - Red Word Triage >> Jun 22, 2024 12:42 PM Laurier C wrote: Red Word that prompted transfer to Nurse Triage: Patient tested positive for COVID about 10 min ago and is showing headache,  stuffy nose, fatigue sore throat, weakness. Patient is unable to get out of the bed.

## 2024-06-22 NOTE — Patient Instructions (Addendum)
 Barbara Mcmahon, thank you for joining [name], NP for today's virtual visit.  While this provider is not your primary care provider (PCP), if your PCP is located in our provider database this encounter information will be shared with them immediately following your visit.   A [facility] MyChart account gives you access to today's visit and all your visits, tests, and labs performed at [facility]  click here if you don't have a [place] MyChart account or go to [email or web address]  Consent: (Patient) Barbara Mcmahon provided verbal consent for this virtual visit at the beginning of the encounter.  Current Medications:  Current Outpatient Medications:    benzonatate  (TESSALON ) 100 MG capsule, Take 1 capsule (100 mg total) by mouth 3 (three) times daily as needed for cough., Disp: 30 capsule, Rfl: 0   promethazine -dextromethorphan (PROMETHAZINE -DM) 6.25-15 MG/5ML syrup, Take 5 mLs by mouth 4 (four) times daily as needed for cough., Disp: 118 mL, Rfl: 0   amphetamine -dextroamphetamine  (ADDERALL XR) 20 MG 24 hr capsule, Take 1 capsule (20 mg total) by mouth every morning., Disp: 30 capsule, Rfl: 0   amphetamine -dextroamphetamine  (ADDERALL XR) 20 MG 24 hr capsule, Take 1 capsule (20 mg total) by mouth every morning., Disp: 30 capsule, Rfl: 0   [START ON 06/23/2024] amphetamine -dextroamphetamine  (ADDERALL XR) 20 MG 24 hr capsule, Take 1 capsule (20 mg total) by mouth every morning., Disp: 30 capsule, Rfl: 0   Atogepant (QULIPTA) 30 MG TABS, daily., Disp: , Rfl:    cyproheptadine (PERIACTIN) 4 MG tablet, Take 4 mg by mouth 2 (two) times daily., Disp: , Rfl:    DULoxetine  (CYMBALTA ) 60 MG capsule, Take 2 capsules (120 mg total) by mouth daily., Disp: 180 capsule, Rfl: 1   ELIQUIS  5 MG TABS tablet, TAKE 1 TABLET BY MOUTH TWICE DAILY, Disp: 180 tablet, Rfl: 1   EPINEPHrine  0.3 mg/0.3 mL IJ SOAJ injection, Inject 0.3 mg into the muscle as needed for anaphylaxis., Disp: 1  each, Rfl: 1   KLAYESTA  powder, Apply topically daily., Disp: , Rfl:    Multiple Vitamins-Minerals (BARIATRIC MULTIVITAMINS/IRON PO), Take 1 tablet by mouth 2 (two) times daily., Disp: , Rfl:    ondansetron  (ZOFRAN ) 4 MG tablet, Take 1 tablet (4 mg total) by mouth every 8 (eight) hours as needed for nausea or vomiting., Disp: 20 tablet, Rfl: 0   pregabalin  (LYRICA ) 50 MG capsule, Take 1 capsule (50 mg total) by mouth 2 (two) times daily., Disp: 180 capsule, Rfl: 3   rizatriptan  (MAXALT -MLT) 10 MG disintegrating tablet, Take 10 mg by mouth as needed for migraine., Disp: , Rfl:    SLYND 4 MG TABS, Take 1 tablet by mouth daily., Disp: , Rfl:    tirzepatide  (ZEPBOUND ) 10 MG/0.5ML Pen, Inject 10 mg into the skin once a week., Disp: 6 mL, Rfl: 1   traZODone  (DESYREL ) 50 MG tablet, Take 0.5-1 tablets (25-50 mg total) by mouth at bedtime., Disp: 90 tablet, Rfl: 1   Medications ordered in this encounter:  Meds ordered this encounter  Medications   promethazine -dextromethorphan (PROMETHAZINE -DM) 6.25-15 MG/5ML syrup    Sig: Take 5 mLs by mouth 4 (four) times daily as needed for cough.    Dispense:  118 mL    Refill:  0    Supervising Provider:   LAMPTEY, PHILIP O [[id number]]   benzonatate  (TESSALON ) 100 MG capsule    Sig: Take 1 capsule (100 mg total) by mouth 3 (three) times daily as needed for cough.    Dispense:  30  capsule    Refill:  0    Supervising Provider:   BLAISE ALEENE KIDD [[id number]]     *If you need refills on other medications prior to your next appointment, please contact your pharmacy*  Follow-Up: Call back or seek an in-person evaluation if the symptoms worsen or if the condition fails to improve as anticipated.  [facility] [phone]  Care Instructions:  - Continue OTC symptomatic management of choice - Info for COVID sent on AVS as well - Take prescribed medications as directed - Push fluids - Rest as needed   Isolation Instructions: You are to  isolate at home until you have been fever free for at least 24 hours without a fever-reducing medication, and symptoms have been steadily improving for 24 hours. At that time,  you can end isolation but need to mask for an additional 5 days.   If you must be around other household members who do not have symptoms, you need to make sure that both you and the family members are masking consistently with a high-quality mask.  If you note any worsening of symptoms despite treatment, please seek an in-person evaluation ASAP. If you note any significant shortness of breath or any chest pain, please seek ER evaluation. Please do not delay care!   COVID-19: What to Do if You Are Sick If you test positive and are an older adult or someone who is at high risk of getting very sick from COVID-19, treatment may be available. Contact a healthcare provider right away after a positive test to determine if you are eligible, even if your symptoms are mild right now. You can also visit a Test to Treat location and, if eligible, receive a prescription from a provider. Don't delay: Treatment must be started within the first few days to be effective. If you have a fever, cough, or other symptoms, you might have COVID-19. Most people have mild illness and are able to recover at home. If you are sick: Keep track of your symptoms. If you have an emergency warning sign (including trouble breathing), call 911. Steps to help prevent the spread of COVID-19 if you are sick If you are sick with COVID-19 or think you might have COVID-19, follow the steps below to care for yourself and to help protect other people in your home and community. Stay home except to get medical care Stay home. Most people with COVID-19 have mild illness and can recover at home without medical care. Do not leave your home, except to get medical care. Do not visit public areas and do not go to places where you are unable to wear a mask. Take care of  yourself. Get rest and stay hydrated. Take over-the-counter medicines, such as acetaminophen , to help you feel better. Stay in touch with your doctor. Call before you get medical care. Be sure to get care if you have trouble breathing, or have any other emergency warning signs, or if you think it is an emergency. Avoid public transportation, ride-sharing, or taxis if possible. Get tested If you have symptoms of COVID-19, get tested. While waiting for test results, stay away from others, including staying apart from those living in your household. Get tested as soon as possible after your symptoms start. Treatments may be available for people with COVID-19 who are at risk for becoming very sick. Don't delay: Treatment must be started early to be effective--some treatments must begin within 5 days of your first symptoms. Contact your healthcare  provider right away if your test result is positive to determine if you are eligible. Self-tests are one of several options for testing for the virus that causes COVID-19 and may be more convenient than laboratory-based tests and point-of-care tests. Ask your healthcare provider or your local health department if you need help interpreting your test results. You can visit your state, tribal, local, and territorial health department's website to look for the latest local information on testing sites. Separate yourself from other people As much as possible, stay in a specific room and away from other people and pets in your home. If possible, you should use a separate bathroom. If you need to be around other people or animals in or outside of the home, wear a well-fitting mask. Tell your close contacts that they may have been exposed to COVID-19. An infected person can spread COVID-19 starting 48 hours (or 2 days) before the person has any symptoms or tests positive. By letting your close contacts know they may have been exposed to COVID-19, you are helping to protect  everyone. See COVID-19 and Animals if you have questions about pets. If you are diagnosed with COVID-19, someone from the health department may call you. Answer the call to slow the spread. Monitor your symptoms Symptoms of COVID-19 include fever, cough, or other symptoms. Follow care instructions from your healthcare provider and local health department. Your local health authorities may give instructions on checking your symptoms and reporting information. When to seek emergency medical attention Look for emergency warning signs* for COVID-19. If someone is showing any of these signs, seek emergency medical care immediately: Trouble breathing Persistent pain or pressure in the chest New confusion Inability to wake or stay awake Pale, gray, or blue-colored skin, lips, or nail beds, depending on skin tone *This list is not all possible symptoms. Please call your medical provider for any other symptoms that are severe or concerning to you. Call 911 or call ahead to your local emergency facility: Notify the operator that you are seeking care for someone who has or may have COVID-19. Call ahead before visiting your doctor Call ahead. Many medical visits for routine care are being postponed or done by phone or telemedicine. If you have a medical appointment that cannot be postponed, call your doctor's office, and tell them you have or may have COVID-19. This will help the office protect themselves and other patients. If you are sick, wear a well-fitting mask You should wear a mask if you must be around other people or animals, including pets (even at home). Wear a mask with the best fit, protection, and comfort for you. You don't need to wear the mask if you are alone. If you can't put on a mask (because of trouble breathing, for example), cover your coughs and sneezes in some other way. Try to stay at least 6 feet away from other people. This will help protect the people around you. Masks should  not be placed on young children under age 49 years, anyone who has trouble breathing, or anyone who is not able to remove the mask without help. Cover your coughs and sneezes Cover your mouth and nose with a tissue when you cough or sneeze. Throw away used tissues in a lined trash can. Immediately wash your hands with soap and water for at least 20 seconds. If soap and water are not available, clean your hands with an alcohol-based hand sanitizer that contains at least 60% alcohol. Clean your hands often Wash  your hands often with soap and water for at least 20 seconds. This is especially important after blowing your nose, coughing, or sneezing; going to the bathroom; and before eating or preparing food. Use hand sanitizer if soap and water are not available. Use an alcohol-based hand sanitizer with at least 60% alcohol, covering all surfaces of your hands and rubbing them together until they feel dry. Soap and water are the best option, especially if hands are visibly dirty. Avoid touching your eyes, nose, and mouth with unwashed hands. Handwashing Tips Avoid sharing personal household items Do not share dishes, drinking glasses, cups, eating utensils, towels, or bedding with other people in your home. Wash these items thoroughly after using them with soap and water or put in the dishwasher. Clean surfaces in your home regularly Clean and disinfect high-touch surfaces (for example, doorknobs, tables, handles, light switches, and countertops) in your sick room and bathroom. In shared spaces, you should clean and disinfect surfaces and items after each use by the person who is ill. If you are sick and cannot clean, a caregiver or other person should only clean and disinfect the area around you (such as your bedroom and bathroom) on an as needed basis. Your caregiver/other person should wait as long as possible (at least several hours) and wear a mask before entering, cleaning, and disinfecting shared  spaces that you use. Clean and disinfect areas that may have blood, stool, or body fluids on them. Use household cleaners and disinfectants. Clean visible dirty surfaces with household cleaners containing soap or detergent. Then, use a household disinfectant. Use a product from [employer] List N: Disinfectants for Coronavirus (COVID-19). Be sure to follow the instructions on the label to ensure safe and effective use of the product. Many products recommend keeping the surface wet with a disinfectant for a certain period of time (look at contact time on the product label). You may also need to wear personal protective equipment, such as gloves, depending on the directions on the product label. Immediately after disinfecting, wash your hands with soap and water for 20 seconds. For completed guidance on cleaning and disinfecting your home, visit Complete Disinfection Guidance. Take steps to improve ventilation at home Improve ventilation (air flow) at home to help prevent from spreading COVID-19 to other people in your household. Clear out COVID-19 virus particles in the air by opening windows, using air filters, and turning on fans in your home. Use this interactive tool to learn how to improve air flow in your home. When you can be around others after being sick with COVID-19 Deciding when you can be around others is different for different situations. Find out when you can safely end home isolation. For any additional questions about your care, contact your healthcare provider or state or local health department. 12/27/2020 Content source: [facility] for Immunization and Respiratory Diseases ([facility]), Division of Viral Diseases This information is not intended to replace advice given to you by your health care provider. Make sure you discuss any questions you have with your health care provider. Document Revised: 02/09/2021 Document Reviewed: 02/09/2021 Elsevier Patient Education  2022 [employer].     If you have been instructed to have an in-person evaluation today at a local Urgent Care facility, please use the link below. It will take you to a list of all of our available [facility], including address, phone number and hours of operation. Please do not delay care.  [facility]  If  you or a family member do not have a primary care provider, use the link below to schedule a visit and establish care. When you choose a [facility] primary care physician or advanced practice provider, you gain a long-term partner in health. Find a Primary Care Provider  Learn more about [place]'s in-office and virtual care options: [facility] - Get Care Now

## 2024-06-22 NOTE — Progress Notes (Signed)
 Virtual Visit Consent   Barbara Mcmahon, you are scheduled for a virtual visit with a [facility] provider today. Just as with appointments in the office, your consent must be obtained to participate. Your consent will be active for this visit and any virtual visit you may have with one of our providers in the next 365 days. If you have a MyChart account, a copy of this consent can be sent to you electronically.  As this is a virtual visit, video technology does not allow for your provider to perform a traditional examination. This may limit your provider's ability to fully assess your condition. If your provider identifies any concerns that need to be evaluated in person or the need to arrange testing (such as labs, EKG, etc.), we will make arrangements to do so. Although advances in technology are sophisticated, we cannot ensure that it will always work on either your end or our end. If the connection with a video visit is poor, the visit may have to be switched to a telephone visit. With either a video or telephone visit, we are not always able to ensure that we have a secure connection.  By engaging in this virtual visit, you consent to the provision of healthcare and authorize for your insurance to be billed (if applicable) for the services provided during this visit. Depending on your insurance coverage, you may receive a charge related to this service.  I need to obtain your verbal consent now. Are you willing to proceed with your visit today? Barbara Mcmahon has provided verbal consent on 06/22/2024 for a virtual visit (video or telephone). Barbara CHRISTELLA Barefoot, NP  Date: 06/22/2024 3:31 PM   Virtual Visit via Video Note   I, Barbara Mcmahon, connected with  Barbara Mcmahon  ([id number], Feb 14, 1976) on 06/22/24 at  3:30 PM EDT by a video-enabled telemedicine application and verified that I am speaking with the correct person using two identifiers.  Location: Patient: Virtual Visit Location  Patient: Home Provider: Virtual Visit Location Provider: Home Office   I discussed the limitations of evaluation and management by telemedicine and the availability of in person appointments. The patient expressed understanding and agreed to proceed.    History of Present Illness: Barbara Mcmahon is a 48 y.o. who identifies as a female who was assigned female at birth, and is being seen today for COVID   Onset was last night into today- drainage and headaches Associated symptoms are dry cough, body aches/back ache, sore throat, fatigued Modifying factors are sudafed  Denies chest pain, shortness of breath, fevers, chills  Exposure to sick contacts- daughter was + over weekend, and son is now too +  COVID test: COVID + today  Of note she is on Eliquis     Problems:  Patient Active Problem List   Diagnosis Date Noted   Orthostatic hypotension 12/30/2023   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/06/2023   Migraine 02/12/2023   At high risk for breast cancer 10/03/2021   Xerosis of skin 07/27/2021   Meniscal tear of left knee 06/30/2021   Hair loss 06/01/2021   Plantar fasciitis, bilateral 05/26/2021   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   History of COVID-19 01/18/2021   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 06/19/2020   Attention deficit hyperactivity disorder (ADHD), 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   Vitamin D  deficiency 05/21/2019   Severe obesity (HCC) 05/21/2019   Insulin  resistance 04/01/2019   BMI 40.0-44.9, adult (HCC) 04/01/2019   Constipation due to slow  transit 04/01/2019   History of pulmonary embolism 07/04/2017   Pulmonary embolism (HCC) 07/04/2017   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   GAD (generalized anxiety disorder) 04/08/2017   Shrimp allergy 09/24/2016   Allergy to yeast 09/24/2016   Dyslipidemia 03/17/2015   MRSA (methicillin resistant Staphylococcus aureus) 01/14/2015   Atypical squamous cells of  undetermined significance on cytologic smear of cervix (ASC-US ) 08/27/2014   Family history of hypertrophic cardiomyopathy 04/23/2013   Raynaud disease 11/19/2012   Irritable bowel syndrome with diarrhea (colonoscopy 2007) 07/04/2012   Obesity, morbid (more than 100 lbs over ideal weight or BMI > 40) (HCC) 06/30/2012   Anxiety 06/30/2012   PCOS (polycystic ovarian syndrome) 06/30/2012    Allergies:  Allergies  Allergen Reactions   Cinnamon Shortness Of Breath and Rash    Wheezing    Morphine Hives, Itching and Rash    Other reaction(s): Other (See Comments) BEHAVIOR CHANGES Big mood changes irritable   Nutmeg Oil (Myristica Oil) Shortness Of Breath and Rash    wheezing Other reaction(s): Not available   Shellfish Allergy Anaphylaxis, Hives and Swelling    Throat closes and wheezing;   per pt shrimp only   Shrimp Extract     Other reaction(s): Wheezing Other reaction(s): wheezing   Codeine Hives, Itching and Rash    Pt says she can take percocet   Tramadol  Itching and Rash   Candida Albicans     Other reaction(s): Not available   Clonazepam  Itching   Hydrocodone  Hives   Menthol  Hives   Robitussin A-C [Guaifenesin -Codeine] Itching   Yeast Hives   Medications:  Current Outpatient Medications:    amphetamine -dextroamphetamine  (ADDERALL XR) 20 MG 24 hr capsule, Take 1 capsule (20 mg total) by mouth every morning., Disp: 30 capsule, Rfl: 0   amphetamine -dextroamphetamine  (ADDERALL XR) 20 MG 24 hr capsule, Take 1 capsule (20 mg total) by mouth every morning., Disp: 30 capsule, Rfl: 0   [START ON 06/23/2024] amphetamine -dextroamphetamine  (ADDERALL XR) 20 MG 24 hr capsule, Take 1 capsule (20 mg total) by mouth every morning., Disp: 30 capsule, Rfl: 0   Atogepant (QULIPTA) 30 MG TABS, daily., Disp: , Rfl:    cyproheptadine (PERIACTIN) 4 MG tablet, Take 4 mg by mouth 2 (two) times daily., Disp: , Rfl:    DULoxetine  (CYMBALTA ) 60 MG capsule, Take 2 capsules (120 mg total) by mouth  daily., Disp: 180 capsule, Rfl: 1   ELIQUIS  5 MG TABS tablet, TAKE 1 TABLET BY MOUTH TWICE DAILY, Disp: 180 tablet, Rfl: 1   EPINEPHrine  0.3 mg/0.3 mL IJ SOAJ injection, Inject 0.3 mg into the muscle as needed for anaphylaxis., Disp: 1 each, Rfl: 1   KLAYESTA  powder, Apply topically daily., Disp: , Rfl:    Multiple Vitamins-Minerals (BARIATRIC MULTIVITAMINS/IRON PO), Take 1 tablet by mouth 2 (two) times daily., Disp: , Rfl:    ondansetron  (ZOFRAN ) 4 MG tablet, Take 1 tablet (4 mg total) by mouth every 8 (eight) hours as needed for nausea or vomiting., Disp: 20 tablet, Rfl: 0   pregabalin  (LYRICA ) 50 MG capsule, Take 1 capsule (50 mg total) by mouth 2 (two) times daily., Disp: 180 capsule, Rfl: 3   rizatriptan  (MAXALT -MLT) 10 MG disintegrating tablet, Take 10 mg by mouth as needed for migraine., Disp: , Rfl:    SLYND 4 MG TABS, Take 1 tablet by mouth daily., Disp: , Rfl:    tirzepatide  (ZEPBOUND ) 10 MG/0.5ML Pen, Inject 10 mg into the skin once a week., Disp: 6 mL, Rfl: 1  traZODone  (DESYREL ) 50 MG tablet, Take 0.5-1 tablets (25-50 mg total) by mouth at bedtime., Disp: 90 tablet, Rfl: 1  Observations/Objective: Patient is well-developed, well-nourished in no acute distress.  Resting comfortably  at home.  Head is normocephalic, atraumatic.  No labored breathing.  Speech is clear and coherent with logical content.  Patient is alert and oriented at baseline.    Assessment and Plan:   1. COVID-19 (Primary)  - promethazine -dextromethorphan (PROMETHAZINE -DM) 6.25-15 MG/5ML syrup; Take 5 mLs by mouth 4 (four) times daily as needed for cough.  Dispense: 118 mL; Refill: 0 - benzonatate  (TESSALON ) 100 MG capsule; Take 1 capsule (100 mg total) by mouth 3 (three) times daily as needed for cough.  Dispense: 30 capsule; Refill: 0  - Continue OTC symptomatic management of choice - Info for COVID sent on AVS as well - Take prescribed medications as directed - Push fluids - Rest as needed -  Discussed return precautions and when to seek in-person evaluation, sent via AVS as well    Reviewed side effects, risks and benefits of medication.    Patient acknowledged agreement and understanding of the plan.   Past Medical, Surgical, Social History, Allergies, and Medications have been Reviewed.    Follow Up Instructions: I discussed the assessment and treatment plan with the patient. The patient was provided an opportunity to ask questions and all were answered. The patient agreed with the plan and demonstrated an understanding of the instructions.  A copy of instructions were sent to the patient via MyChart unless otherwise noted below.    The patient was advised to call back or seek an in-person evaluation if the symptoms worsen or if the condition fails to improve as anticipated.    Barbara CHRISTELLA Barefoot, NP

## 2024-07-01 DIAGNOSIS — G43009 Migraine without aura, not intractable, without status migrainosus: Secondary | ICD-10-CM | POA: Diagnosis not present

## 2024-07-01 DIAGNOSIS — J309 Allergic rhinitis, unspecified: Secondary | ICD-10-CM | POA: Diagnosis not present

## 2024-07-02 ENCOUNTER — Encounter: Payer: Self-pay | Admitting: Family Medicine

## 2024-07-02 ENCOUNTER — Ambulatory Visit: Admitting: Family Medicine

## 2024-07-02 VITALS — BP 92/58 | HR 89 | Ht 64.0 in | Wt 223.0 lb

## 2024-07-02 DIAGNOSIS — Z23 Encounter for immunization: Secondary | ICD-10-CM | POA: Diagnosis not present

## 2024-07-02 DIAGNOSIS — E88819 Insulin resistance, unspecified: Secondary | ICD-10-CM | POA: Diagnosis not present

## 2024-07-02 LAB — POCT GLYCOSYLATED HEMOGLOBIN (HGB A1C): Hemoglobin A1C: 5 % (ref 4.0–5.6)

## 2024-07-02 NOTE — Addendum Note (Signed)
 Addended by: FREYA BASCOM CROME on: 07/02/2024 11:46 AM   Modules accepted: Orders

## 2024-07-02 NOTE — Assessment & Plan Note (Addendum)
 She is really doing phenomenally well!   Visit #: 9 Starting Weight: 285 lbs   Current weight:  223 lbs  Previous weight: 217 lbs  Change in weight form last OV: Up 6 lbs  Goal weight: She does not have a specific goal in mind I think originally she was thinking around 213. Dietary goals: Getting in 100 g protein daily, work on vegetables . Has cut out sweets.  Continue to work on adequate water intake. Exercise goals: Trying to get in short walks and doing some resistance training Medication: Continue Zepbound  10mg   Zofran  for the nausea prn  Follow-up and referrals: 12 weeks

## 2024-07-02 NOTE — Progress Notes (Signed)
 Established Patient Office Visit  Subjective  Patient ID: Barbara Mcmahon, female    DOB: May 14, 1976  Age: 48 y.o. MRN: [id number]  Chief Complaint  Patient presents with   Weight Check    HPI Discussed the use of AI scribe software for clinical note transcription with the patient, who gave verbal consent to proceed.  History of Present Illness Barbara Mcmahon is a 48 year old female who presents with post-COVID symptoms and weight management concerns.  Post-acute covid-19 respiratory symptoms - Tested positive for COVID-19 approximately two weeks ago, with initial positive test on a Saturday - Prolonged symptoms include excess mucus, frequent throat clearing, and persistent nasal drainage - Symptoms have persisted for several weeks following infection - Uses saline spray twice daily for symptom management - No fever in the last couple of days  Weight management and appetite awareness - Recent weight gain of approximately six pounds, attributed to decreased physical activity and dietary changes during COVID-19 illness - Has resumed healthier eating habits, including increased consumption of salads - Has restarted physical activity, including a 15-minute walk to bring in garbage cans - Desires to maintain current weight and feels more attuned to hunger signals - Currently taking a 10 mg dose of Zepbound ., which she feels is aiding in developing healthier eating habits - Feels more aware of hunger and does not wish to increase medication dosage at this time      ROS    Objective:     BP (!) 92/58 (BP Location: Left Arm, Patient Position: Sitting, Cuff Size: Large)   Pulse 89   Ht 5' 4 (1.626 m)   Wt 223 lb (101.2 kg)   SpO2 100%   BMI 38.28 kg/m    Physical Exam Vitals and nursing note reviewed.  Constitutional:      Appearance: Normal appearance.  HENT:     Head: Normocephalic and atraumatic.  Eyes:     Conjunctiva/sclera: Conjunctivae normal.   Cardiovascular:     Rate and Rhythm: Normal rate and regular rhythm.  Pulmonary:     Effort: Pulmonary effort is normal.     Breath sounds: Normal breath sounds.  Skin:    General: Skin is warm and dry.  Neurological:     Mental Status: She is alert.  Psychiatric:        Mood and Affect: Mood normal.      No results found for any visits on 07/02/24.    The 10-year ASCVD risk score (Arnett DK, et al., 2019) is: 0.5%    Assessment & Plan:   Problem List Items Addressed This Visit       Endocrine   Insulin  resistance - Primary   She is really doing phenomenally well!   Visit #: 9 Starting Weight: 285 lbs   Current weight:  223 lbs  Previous weight: 217 lbs  Change in weight form last OV: Up 6 lbs  Goal weight: She does not have a specific goal in mind I think originally she was thinking around 213. Dietary goals: Getting in 100 g protein daily, work on vegetables . Has cut out sweets.  Continue to work on adequate water intake. Exercise goals: Trying to get in short walks and doing some resistance training Medication: Continue Zepbound  10mg   Zofran  for the nausea prn  Follow-up and referrals: 12 weeks         Assessment and Plan Assessment & Plan Obesity with insulin  resistance and polycystic ovarian syndrome (PCOS) Weight gain likely due to  recent illness and decreased activity. Zepbound  10 mg aiding in healthier eating habits. Prefers current dosage. Focused on healthy lifestyle. - Continue Zepbound  10 mg. - Encourage regular physical activity and healthy eating habits. - Provide refills for Zepbound . - Discuss potential for dosage adjustment if needed in the future.  Recent COVID-19 infection with persistent upper respiratory symptoms Tested positive for COVID-19 two weeks ago. Persistent upper respiratory symptoms with clear lungs. Using saline spray. No current sinus infection. - Continue using saline spray twice daily. - Administer flu shot today. -  Advise waiting at least one month post-COVID infection before receiving the COVID-19 vaccine.    Return in about 12 weeks (around 09/24/2024) for Weight mgt .    Dorothyann Byars, MD

## 2024-07-16 ENCOUNTER — Other Ambulatory Visit: Payer: Self-pay | Admitting: *Deleted

## 2024-07-16 MED ORDER — DULOXETINE HCL 60 MG PO CPEP: 120.0000 mg | ORAL_CAPSULE | Freq: Every day | ORAL | 1 refills | Status: AC

## 2024-08-03 ENCOUNTER — Encounter: Payer: Self-pay | Admitting: Family Medicine

## 2024-08-03 DIAGNOSIS — F5101 Primary insomnia: Secondary | ICD-10-CM

## 2024-08-03 MED ORDER — TRAZODONE HCL 50 MG PO TABS: 25.0000 mg | ORAL_TABLET | Freq: Every day | ORAL | 1 refills | Status: DC

## 2024-08-09 ENCOUNTER — Ambulatory Visit
Admission: RE | Admit: 2024-08-09 | Discharge: 2024-08-09 | Disposition: A | Discharge status: Home / Self Care | Attending: Internal Medicine | Admitting: Internal Medicine

## 2024-08-09 VITALS — BP 105/64 | HR 85 | Temp 98.2°F | Resp 18

## 2024-08-09 DIAGNOSIS — R21 Rash and other nonspecific skin eruption: Secondary | ICD-10-CM | POA: Diagnosis not present

## 2024-08-09 MED ORDER — DEXAMETHASONE SOD PHOSPHATE PF 10 MG/ML IJ SOLN
10.0000 mg | Freq: Once | INTRAMUSCULAR | Status: AC
  Administered 2024-08-09: 10 mg via INTRAMUSCULAR

## 2024-08-09 NOTE — ED Provider Notes (Signed)
 [facility]    CSN: [id number] Arrival date & time: 08/09/24  1055      History   Chief Complaint Chief Complaint  Patient presents with   Allergic Reaction    I have welts with blisters on my arms. I have treated with Benadryl  and calamine lotion and haven't received relief. - Entered by patient    HPI Barbara Mcmahon is a 48 y.o. female.   Patient presents with itchy and painful rash to bilateral forearms and upper chest that started about 2 days ago. Denies any feelings of throat closing or shortness of breath. Denies any known sick contacts. Reports that she has multiple allergies, and she drink a new cider beverage on Halloween. The rash started that night per patient. She has been taking Benadryl  consistently and using calamine lotion with no improvement.    Allergic Reaction   Past Medical History:  Diagnosis Date   ADHD (attention deficit hyperactivity disorder)    Anticoagulated    eliquis ---  managed by dr timmy   DDD (degenerative disc disease), lumbosacral    Family history of hypertrophic cardiomyopathy    last echo in epic 02-24-2021  ef 55-60%   GAD (generalized anxiety disorder)    Generalized anxiety disorder    GERD (gastroesophageal reflux disease)    History of abnormal cervical Pap smear    History of febrile seizure    per pt as infant resolved when toddler   History of irritable bowel syndrome    when younger in college   History of methicillin resistant staphylococcus aureus (MRSA) 2016   01/ 2016 abdominal wound;  02/ 2016  cellulitis/ abscess of leg   History of pulmonary embolism 07/04/2017   left lung (recent travel)  ;  takes eliquis ;   followed by dr timmy   Hx of adenomatous colonic polyps    Infertility, female    Menorrhagia    PCOS (polycystic ovarian syndrome)    PONV (postoperative nausea and vomiting)    Prediabetes    Wears contact lenses     Patient Active Problem List   Diagnosis Date Noted   Orthostatic  hypotension 12/30/2023   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/06/2023   Migraine 02/12/2023   At high risk for breast cancer 10/03/2021   Xerosis of skin 07/27/2021   Meniscal tear of left knee 06/30/2021   Hair loss 06/01/2021   Plantar fasciitis, bilateral 05/26/2021   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   History of COVID-19 01/18/2021   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 06/19/2020   Attention deficit hyperactivity disorder (ADHD), 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   Vitamin D  deficiency 05/21/2019   Severe obesity (HCC) 05/21/2019   Insulin  resistance 04/01/2019   BMI 40.0-44.9, adult (HCC) 04/01/2019   Constipation due to slow transit 04/01/2019   History of pulmonary embolism 07/04/2017   Pulmonary embolism (HCC) 07/04/2017   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   GAD (generalized anxiety disorder) 04/08/2017   Shrimp allergy 09/24/2016   Allergy to yeast 09/24/2016   Dyslipidemia 03/17/2015   MRSA (methicillin resistant Staphylococcus aureus) 01/14/2015   Atypical squamous cells of undetermined significance on cytologic smear of cervix (ASC-US ) 08/27/2014   Family history of hypertrophic cardiomyopathy 04/23/2013   Raynaud disease 11/19/2012   Irritable bowel syndrome with diarrhea (colonoscopy 2007) 07/04/2012   Obesity, morbid (more than 100 lbs over ideal weight or BMI > 40) (HCC) 06/30/2012   Anxiety 06/30/2012   PCOS (polycystic ovarian syndrome) 06/30/2012  Past Surgical History:  Procedure Laterality Date   BREAST BIOPSY Right 2019   benign   CHOLECYSTECTOMY, LAPAROSCOPIC  2003   COLONOSCOPY WITH PROPOFOL   11/30/2021   by  dr wilhelmenia   DIAGNOSTIC LAPAROSCOPY  2011   by dr mat;  for infertility   DILATION AND EVACUATION  03/12/2011   @[facility]   by dr mat;   missed ab   DILITATION & CURRETTAGE/HYSTROSCOPY WITH HYDROTHERMAL ABLATION N/A 06/18/2022   Procedure: DILATATION & CURETTAGE/HYSTEROSCOPY WITH  HYDROTHERMAL ABLATION;  Surgeon: Mat Browning, MD;  Location: [facility];  Service: Gynecology;  Laterality: N/A;   IUD REMOVAL N/A 06/18/2022   Procedure: INTRAUTERINE DEVICE (IUD) REMOVAL;  Surgeon: Mat Browning, MD;  Location: [facility] [place];  Service: Gynecology;  Laterality: N/A;   KNEE ARTHROSCOPY Left    2000  and 07/ 2023   LAPAROSCOPIC GASTRIC SLEEVE RESECTION WITH HIATAL HERNIA REPAIR  05/22/2021   @[facility]   by dr forbes. tanda   LAPAROSCOPIC TUBAL LIGATION Bilateral 06/18/2022   Procedure: ATTEMPTED LAPAROSCOPY;  Surgeon: Mat Browning, MD;  Location: [facility];  Service: Gynecology;  Laterality: Bilateral;   UPPER GI ENDOSCOPY N/A 05/22/2021   Procedure: UPPER GI ENDOSCOPY;  Surgeon: Tanda Locus, MD;  Location: [facility];  Service: General;  Laterality: N/A;    OB History     Gravida  2   Para  1   Term  1   Preterm      AB  1   Living  1      SAB  1   IAB      Ectopic      Multiple      Live Births  1            Home Medications    Prior to Admission medications   Medication Sig Start Date End Date Taking? Authorizing Provider  amphetamine -dextroamphetamine  (ADDERALL XR) 20 MG 24 hr capsule Take 1 capsule (20 mg total) by mouth every morning. 04/28/24   Alvan Dorothyann BIRCH, MD  amphetamine -dextroamphetamine  (ADDERALL XR) 20 MG 24 hr capsule Take 1 capsule (20 mg total) by mouth every morning. 05/26/24   Alvan Dorothyann BIRCH, MD  amphetamine -dextroamphetamine  (ADDERALL XR) 20 MG 24 hr capsule Take 1 capsule (20 mg total) by mouth every morning. 06/23/24   Alvan Dorothyann BIRCH, MD  cyproheptadine (PERIACTIN) 4 MG tablet Take 4 mg by mouth 2 (two) times daily. 02/10/24   [provider]  DULoxetine  (CYMBALTA ) 60 MG capsule Take 2 capsules (120 mg total) by mouth daily. 07/16/24   Alvan Dorothyann BIRCH, MD  ELIQUIS  5 MG TABS tablet TAKE 1 TABLET BY MOUTH TWICE DAILY 03/30/24   Timmy Maude SAUNDERS, MD   EPINEPHrine  0.3 mg/0.3 mL IJ SOAJ injection Inject 0.3 mg into the muscle as needed for anaphylaxis. 01/01/24   Alvan Dorothyann BIRCH, MD  KLAYESTA  powder Apply topically daily. 12/26/23   [provider]  Multiple Vitamins-Minerals (BARIATRIC MULTIVITAMINS/IRON PO) Take 1 tablet by mouth 2 (two) times daily.    [provider]  ondansetron  (ZOFRAN ) 4 MG tablet Take 1 tablet (4 mg total) by mouth every 8 (eight) hours as needed for nausea or vomiting. 02/22/23   Alvan Dorothyann BIRCH, MD  pregabalin  (LYRICA ) 50 MG capsule Take 1 capsule (50 mg total) by mouth 2 (two) times daily. 05/08/24   Curtis Debby PARAS, MD  rizatriptan  (MAXALT -MLT) 10 MG disintegrating tablet Take 10 mg by mouth as needed for migraine. 06/22/23  [provider]  SLYND 4 MG TABS Take 1 tablet by mouth daily. 10/16/22   [provider]  tirzepatide  (ZEPBOUND ) 10 MG/0.5ML Pen Inject 10 mg into the skin once a week. 04/28/24   Alvan Dorothyann BIRCH, MD  traZODone  (DESYREL ) 50 MG tablet Take 0.5-1 tablets (25-50 mg total) by mouth at bedtime. 08/03/24   Alvan Dorothyann BIRCH, MD    Family History Family History  Problem Relation Age of Onset   Hypertension Mother    Depression Mother    Anxiety disorder Mother    Eating disorder Mother    Rheum arthritis Mother    Hypertension Father    Depression Father    Anxiety disorder Father    Sleep apnea Father    Obesity Father    Rheum arthritis Father    Breast cancer Sister        Bilat   Colon cancer Paternal Aunt    Diabetes Paternal Grandmother    Hypertension Paternal Grandmother    Stroke Paternal Grandmother    Colon polyps Paternal Grandfather    Diabetes Paternal Grandfather    Hypertension Paternal Grandfather    Stroke Paternal Grandfather    Crohn's disease Cousin    Esophageal cancer Neg Hx    Stomach cancer Neg Hx    Rectal cancer Neg Hx     Social History Social History   Tobacco Use   Smoking status: Never    Smokeless tobacco: Never  Vaping Use   Vaping status: Never Used  Substance Use Topics   Alcohol use: Not Currently    Comment: very rare   Drug use: Never     Allergies   Cinnamon, Morphine, Nutmeg oil (myristica oil), Shellfish allergy, Shrimp extract, Codeine, Tramadol , Candida albicans, Clonazepam , Hydrocodone , Menthol , Robitussin a-c [guaifenesin -codeine], and Yeast (saccharomyces cerevisiae)   Review of Systems Review of Systems Per HPI  Physical Exam Triage Vital Signs ED Triage Vitals  Encounter Vitals Group     BP 08/09/24 1108 105/64     Girls Systolic BP Percentile --      Girls Diastolic BP Percentile --      Boys Systolic BP Percentile --      Boys Diastolic BP Percentile --      Pulse Rate 08/09/24 1108 85     Resp 08/09/24 1108 18     Temp 08/09/24 1108 98.2 F (36.8 C)     Temp Source 08/09/24 1108 Oral     SpO2 08/09/24 1108 96 %     Weight --      Height --      Head Circumference --      Peak Flow --      Pain Score 08/09/24 1106 3     Pain Loc --      Pain Education --      Exclude from Growth Chart --    No data found.  Updated Vital Signs BP 105/64 (BP Location: Right Arm)   Pulse 85   Temp 98.2 F (36.8 C) (Oral)   Resp 18   SpO2 96%   Visual Acuity Right Eye Distance:   Left Eye Distance:   Bilateral Distance:    Right Eye Near:   Left Eye Near:    Bilateral Near:     Physical Exam Constitutional:      General: She is not in acute distress.    Appearance: Normal appearance. She is not toxic-appearing or diaphoretic.  HENT:     Head: Normocephalic  and atraumatic.  Eyes:     Extraocular Movements: Extraocular movements intact.     Conjunctiva/sclera: Conjunctivae normal.  Pulmonary:     Effort: Pulmonary effort is normal.  Skin:    Comments: Flat, erythematous rash in patches scattered throughout bilateral forearms and antecubital spaces as well as upper chest. Mild excoriation noted from scratching.   Neurological:      General: No focal deficit present.     Mental Status: She is alert and oriented to person, place, and time. Mental status is at baseline.  Psychiatric:        Mood and Affect: Mood normal.        Behavior: Behavior normal.        Thought Content: Thought content normal.        Judgment: Judgment normal.      UC Treatments / Results  Labs (all labs ordered are listed, but only abnormal results are displayed) Labs Reviewed - No data to display  EKG   Radiology No results found.  Procedures Procedures (including critical care time)  Medications Ordered in UC Medications  dexamethasone  (DECADRON ) injection 10 mg (10 mg Intramuscular Given 08/09/24 1128)    Initial Impression / Assessment and Plan / UC Course  I have reviewed the triage vital signs and the nursing notes.  Pertinent labs & imaging results that were available during my care of the patient were reviewed by me and considered in my medical decision making (see chart for details).     Rash appears to be allergic in nature. There are no signs of anaphylaxis on exam. Patient was offered topical steroid cream but reports that this typically doesn't work well for her. Therefore, will do IM decadron  as she prefers this over oral prednisone . She has taken steroids previously and tolerated well. Advised strict return precautions. Patient verbalized understanding and was agreeable with plan.  Final Clinical Impressions(s) / UC Diagnoses   Final diagnoses:  Rash and nonspecific skin eruption     Discharge Instructions      You were given a steroid injection today. Please follow up if symptoms persist or worsen.     ED Prescriptions   None    PDMP not reviewed this encounter.   Hazen Darryle BRAVO, [place] 08/09/24 1129

## 2024-08-09 NOTE — ED Triage Notes (Signed)
 Pt c/o painful, raised, itching, red rash to BUE that began last Friday night. Pt states earlier that night she had consumed an alcoholic cider that she had not had previously. Denies any concerns for airway involvement. Has been using Benadryl  q4h, as well as calamine lotion with some relief. Reports rash is present on chest, as well, but primarily on BUE.

## 2024-08-09 NOTE — Discharge Instructions (Signed)
 You were given a steroid injection today. Please follow up if symptoms persist or worsen.

## 2024-08-11 ENCOUNTER — Ambulatory Visit (INDEPENDENT_AMBULATORY_CARE_PROVIDER_SITE_OTHER): Admitting: Family Medicine

## 2024-08-11 ENCOUNTER — Encounter: Payer: Self-pay | Admitting: Family Medicine

## 2024-08-11 VITALS — BP 120/63 | HR 101 | Ht 64.0 in | Wt 232.0 lb

## 2024-08-11 DIAGNOSIS — K580 Irritable bowel syndrome with diarrhea: Secondary | ICD-10-CM

## 2024-08-11 MED ORDER — RIFAXIMIN 550 MG PO TABS: 550.0000 mg | ORAL_TABLET | Freq: Three times a day (TID) | ORAL | 0 refills | Status: AC

## 2024-08-11 NOTE — Progress Notes (Signed)
 Acute Office Visit  Patient ID: Barbara Mcmahon, female    DOB: 02-18-1976, 48 y.o.   MRN: [id number]  PCP: Alvan Dorothyann BIRCH, MD  Chief Complaint  Patient presents with   Irritable Bowel Syndrome    Subjective:     HPI  Discussed the use of AI scribe software for clinical note transcription with the patient, who gave verbal consent to proceed.  History of Present Illness Barbara Mcmahon is a 48 year old female with IBS who presents with worsening bowel symptoms.  Altered bowel habits - Worsening bowel symptoms for the past 1.5 months - Emergent and severe episodes necessitating frequent bathroom visits, even during routine activities - Bowel movements are frequent and unformed - No passage of formed stool - No blood in stool - No fevers - IBS previously managed with Greek yogurt, which is no longer effective - Previous effective treatment with a suspension powder in applesauce, though unpleasant  Dietary and lifestyle factors - No recent changes in diet, including greasy or spicy foods, artificial sweeteners, or increased caffeine intake - Occasional consumption of diet coke, with no change in frequency or quantity - Currently under stress due to job searching - Poor sleep quality attributed to partner's snoring - Improved sleep quality when traveling - Travel does not exacerbate bowel symptoms  Allergic reaction and medication effects - Recent severe allergic reaction requiring a prednisone  injection on Friday - Reaction included hives with blisters on chest and arms - Suspected trigger was a beverage consumed at a brewery - Belief that prednisone  contributed to weight gain   ROS     Objective:    BP 120/63   Pulse (!) 101   Ht 5' 4 (1.626 m)   Wt 232 lb (105.2 kg)   SpO2 100%   BMI 39.82 kg/m    Physical Exam Vitals and nursing note reviewed.  Constitutional:      Appearance: Normal appearance.  HENT:     Head: Normocephalic and atraumatic.   Eyes:     Conjunctiva/sclera: Conjunctivae normal.  Cardiovascular:     Rate and Rhythm: Normal rate and regular rhythm.  Pulmonary:     Effort: Pulmonary effort is normal.     Breath sounds: Normal breath sounds.  Abdominal:     Palpations: Abdomen is soft.     Tenderness: There is no abdominal tenderness.     Comments: Hyperactive BS  Skin:    General: Skin is warm and dry.  Neurological:     Mental Status: She is alert.  Psychiatric:        Mood and Affect: Mood normal.       No results found for any visits on 08/11/24.     Assessment & Plan:   Problem List Items Addressed This Visit       Digestive   Irritable bowel syndrome with diarrhea (colonoscopy 2007) - Primary (Chronic)   Relevant Medications   rifaximin (XIFAXAN) 550 MG TABS tablet    Assessment and Plan Assessment & Plan Irritable bowel syndrome with diarrhea (IBS-D) Flare for six weeks with emergent bowel movements. Stress and poor sleep potential triggers. No blood in stool or fever, indicating typical IBS-D flare. - Increase fiber intake with psyllium, Fibercon, or Metamucil. - Consider Imodium for acute management during important events. - Send prescription for rifaximin to check insurance coverage and cost; consider if fiber insufficient. - Discuss cholestyramine powder for chronic diarrhea if needed. - Monitor symptoms and communicate via MyChart if further intervention required.  Meds ordered this encounter  Medications   rifaximin (XIFAXAN) 550 MG TABS tablet    Sig: Take 1 tablet (550 mg total) by mouth 3 (three) times daily for 14 days.    Dispense:  42 tablet    Refill:  0    No follow-ups on file.  Dorothyann Byars, MD [facility] at [facility]

## 2024-08-13 ENCOUNTER — Telehealth: Payer: Self-pay

## 2024-08-13 NOTE — Telephone Encounter (Signed)
 Copied from CRM #[id number]. Topic: Clinical - Medication Prior Auth >> Aug 13, 2024 12:19 PM [name] wrote: Reason for CRM: Patient states the pharmacy advised her insurance is requiring prior auth for rifaximin (XIFAXAN) 550 MG TABS tablet.   [name] can be reached at [phone]

## 2024-08-19 ENCOUNTER — Telehealth: Payer: Self-pay | Admitting: Pharmacy Technician

## 2024-08-19 ENCOUNTER — Other Ambulatory Visit (HOSPITAL_COMMUNITY): Payer: Self-pay

## 2024-08-19 NOTE — Telephone Encounter (Signed)
 Pharmacy Patient Advocate Encounter   Received notification from Onbase that prior authorization for Xifaxan 550MG  tablets is required/requested.   Insurance verification completed.   The patient is insured through [facility].   Per test claim: PA required; PA submitted to above mentioned insurance via Latent Key/confirmation #/EOC [id number] Status is pending

## 2024-08-20 ENCOUNTER — Other Ambulatory Visit (HOSPITAL_COMMUNITY): Payer: Self-pay

## 2024-08-20 NOTE — Telephone Encounter (Signed)
 Pharmacy Patient Advocate Encounter  Received notification from [facility] that Prior Authorization for Xifaxan 550MG  tablets has been DENIED.  Full denial letter will be uploaded to the media tab. See denial reason below.   PA #/Case ID/Reference #: [id number]

## 2024-08-21 NOTE — Telephone Encounter (Signed)
 Hi Linda, she does have chronic diarrhea with her IBS is just that it has been worse in the last month and a half.  Which is why we were trying to switch medicines to see if we could get better control.  Do think we could try again for the medication.

## 2024-08-25 ENCOUNTER — Telehealth: Payer: Self-pay | Admitting: Pharmacist

## 2024-08-25 NOTE — Telephone Encounter (Signed)
 E-Appeal has been submitted. Will advise when response is received or follow up in 1 week. Please be advised that most companies may take 30 days to make a decision. Did request that it be expedited!  Information was uploaded and submitted via [employer] website.  Thank you, Devere Pandy, PharmD Clinical Pharmacist  [facility]  Direct Dial: [phone]

## 2024-08-26 ENCOUNTER — Inpatient Hospital Stay: Attending: Hematology & Oncology

## 2024-08-26 ENCOUNTER — Inpatient Hospital Stay: Admitting: Hematology & Oncology

## 2024-08-26 ENCOUNTER — Encounter: Payer: Self-pay | Admitting: Hematology & Oncology

## 2024-08-26 VITALS — BP 133/76 | HR 83 | Temp 98.1°F | Resp 18 | Ht 64.0 in | Wt 234.0 lb

## 2024-08-26 DIAGNOSIS — Z86711 Personal history of pulmonary embolism: Secondary | ICD-10-CM

## 2024-08-26 DIAGNOSIS — E282 Polycystic ovarian syndrome: Secondary | ICD-10-CM

## 2024-08-26 DIAGNOSIS — Z7901 Long term (current) use of anticoagulants: Secondary | ICD-10-CM | POA: Diagnosis not present

## 2024-08-26 DIAGNOSIS — Z8616 Personal history of COVID-19: Secondary | ICD-10-CM | POA: Diagnosis not present

## 2024-08-26 LAB — CBC WITH DIFFERENTIAL (CANCER CENTER ONLY)
Abs Immature Granulocytes: 0.01 K/uL (ref 0.00–0.07)
Basophils Absolute: 0 K/uL (ref 0.0–0.1)
Basophils Relative: 1 %
Eosinophils Absolute: 0.1 K/uL (ref 0.0–0.5)
Eosinophils Relative: 3 %
HCT: 35.8 % — ABNORMAL LOW (ref 36.0–46.0)
Hemoglobin: 12 g/dL (ref 12.0–15.0)
Immature Granulocytes: 0 %
Lymphocytes Relative: 40 %
Lymphs Abs: 2.2 K/uL (ref 0.7–4.0)
MCH: 30.8 pg (ref 26.0–34.0)
MCHC: 33.5 g/dL (ref 30.0–36.0)
MCV: 91.8 fL (ref 80.0–100.0)
Monocytes Absolute: 0.4 K/uL (ref 0.1–1.0)
Monocytes Relative: 7 %
Neutro Abs: 2.6 K/uL (ref 1.7–7.7)
Neutrophils Relative %: 49 %
Platelet Count: 240 K/uL (ref 150–400)
RBC: 3.9 MIL/uL (ref 3.87–5.11)
RDW: 12.4 % (ref 11.5–15.5)
WBC Count: 5.3 K/uL (ref 4.0–10.5)
nRBC: 0 % (ref 0.0–0.2)

## 2024-08-26 LAB — CMP (CANCER CENTER ONLY)
ALT: 17 U/L (ref 0–44)
AST: 21 U/L (ref 15–41)
Albumin: 3.9 g/dL (ref 3.5–5.0)
Alkaline Phosphatase: 76 U/L (ref 38–126)
Anion gap: 9 (ref 5–15)
BUN: 10 mg/dL (ref 6–20)
CO2: 26 mmol/L (ref 22–32)
Calcium: 8.9 mg/dL (ref 8.9–10.3)
Chloride: 107 mmol/L (ref 98–111)
Creatinine: 0.73 mg/dL (ref 0.44–1.00)
GFR, Estimated: >60 mL/min (ref >60)
Glucose, Bld: 102 mg/dL — ABNORMAL HIGH (ref 70–99)
Potassium: 3.8 mmol/L (ref 3.5–5.1)
Sodium: 142 mmol/L (ref 135–145)
Total Bilirubin: 0.3 mg/dL (ref 0.0–1.2)
Total Protein: 6.5 g/dL (ref 6.5–8.1)

## 2024-08-26 NOTE — Progress Notes (Signed)
 Hematology and Oncology Follow Up Visit  Kanika Bungert [id number] April 05, 1976 48 y.o. 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 twice daily-complete 1 year of therapy in September 2019 Eilquis 2.5 mg po BID - start 06/08/2018     Interim History:  Ms. Despain is back for follow-up.  So far, she is doing quite well.  She unfortunately has gained a little bit of weight.  She is still working quite hard.  She is not traveling as much.  She did have COVID about 6 or 7 weeks ago.  She did not need any treatment for it.  She is in the ER back on 08/09/2024.  Looks like she had an allergic reaction to some side her beverage.  She was treated I think with a dose of Decadron .  I think this worked well.  Her 2 children are doing fine.  She is homeschooling the youngest 1.  She is going a very busy holiday season.  She has had no problems with the Eliquis .  There is been no cough.  Has been no chest pain.  She has had no shortness of breath.  She has had no headache.  Overall, I would say that her performance status is probably ECOG 0.    Medications:  Current Outpatient Medications:    amphetamine -dextroamphetamine  (ADDERALL XR) 20 MG 24 hr capsule, Take 1 capsule (20 mg total) by mouth every morning., Disp: 30 capsule, Rfl: 0   cyproheptadine (PERIACTIN) 4 MG tablet, Take 4 mg by mouth 2 (two) times daily., Disp: , Rfl:    DULoxetine  (CYMBALTA ) 60 MG capsule, Take 2 capsules (120 mg total) by mouth daily., Disp: 180 capsule, Rfl: 1   ELIQUIS  5 MG TABS tablet, TAKE 1 TABLET BY MOUTH TWICE DAILY, Disp: 180 tablet, Rfl: 1   EPINEPHrine  0.3 mg/0.3 mL IJ SOAJ injection, Inject 0.3 mg into the muscle as needed for anaphylaxis., Disp: 1 each, Rfl: 1   KLAYESTA  powder, Apply topically daily., Disp: , Rfl:    Multiple Vitamins-Minerals (BARIATRIC MULTIVITAMINS/IRON PO), Take 1 tablet by mouth  2 (two) times daily., Disp: , Rfl:    ondansetron  (ZOFRAN ) 4 MG tablet, Take 1 tablet (4 mg total) by mouth every 8 (eight) hours as needed for nausea or vomiting., Disp: 20 tablet, Rfl: 0   pregabalin  (LYRICA ) 50 MG capsule, Take 1 capsule (50 mg total) by mouth 2 (two) times daily., Disp: 180 capsule, Rfl: 3   rizatriptan  (MAXALT -MLT) 10 MG disintegrating tablet, Take 10 mg by mouth as needed for migraine., Disp: , Rfl:    SLYND 4 MG TABS, Take 1 tablet by mouth daily., Disp: , Rfl:    tirzepatide  (ZEPBOUND ) 10 MG/0.5ML Pen, Inject 10 mg into the skin once a week., Disp: 6 mL, Rfl: 1   traZODone  (DESYREL ) 50 MG tablet, Take 0.5-1 tablets (25-50 mg total) by mouth at bedtime., Disp: 90 tablet, Rfl: 1  Allergies:  Allergies  Allergen Reactions   Cinnamon Shortness Of Breath and Rash    Wheezing    Morphine Hives, Itching and Rash    Other reaction(s): Other (See Comments) BEHAVIOR CHANGES Big mood changes irritable   Nutmeg Oil (Myristica Oil) Shortness Of Breath and Rash    wheezing    Shellfish Allergy Anaphylaxis, Hives and Swelling    Throat closes and wheezing;   per pt shrimp only   Shrimp Extract Shortness Of Breath  Other reaction(s): Wheezing   Clonazepam  Itching   Codeine Hives, Itching and Rash    Pt says she can take percocet   Hydrocodone  Hives   Menthol  Hives   Tramadol  Itching and Rash   Candida Albicans Other (See Comments)   Robitussin A-C [Guaifenesin -Codeine] Itching   Yeast (Saccharomyces Cerevisiae) Hives    Past Medical History, Surgical history, Social history, and Family History were reviewed and updated.  Review of Systems: Review of Systems  Constitutional: Negative.   HENT:  Negative.    Eyes: Negative.   Respiratory: Negative.    Cardiovascular: Negative.   Gastrointestinal: Negative.   Endocrine: Negative.   Genitourinary: Negative.    Musculoskeletal: Negative.   Skin: Negative.   Neurological: Negative.   Hematological: Negative.    Psychiatric/Behavioral: Negative.      Physical Exam:  Temperature is 98.1.  Pulse 83.  Blood pressure 133/76.  Weight is 234 pounds.    Wt Readings from Last 3 Encounters:  08/26/24 234 lb (106.1 kg)  08/11/24 232 lb (105.2 kg)  07/02/24 223 lb (101.2 kg)    Physical Exam Vitals reviewed.  HENT:     Head: Normocephalic and atraumatic.  Eyes:     Pupils: Pupils are equal, round, and reactive to light.  Cardiovascular:     Rate and Rhythm: Normal rate and regular rhythm.     Heart sounds: Normal heart sounds.  Pulmonary:     Effort: Pulmonary effort is normal.     Breath sounds: Normal breath sounds.  Abdominal:     General: Bowel sounds are normal.     Palpations: Abdomen is soft.  Musculoskeletal:        General: No tenderness or deformity. Normal range of motion.     Cervical back: Normal range of motion.  Lymphadenopathy:     Cervical: No cervical adenopathy.  Skin:    General: Skin is warm and dry.     Findings: No erythema or rash.  Neurological:     Mental Status: She is alert and oriented to person, place, and time.  Psychiatric:        Behavior: Behavior normal.        Thought Content: Thought content normal.        Judgment: Judgment normal.      Lab Results  Component Value Date   WBC 5.3 08/26/2024   HGB 12.0 08/26/2024   HCT 35.8 (L) 08/26/2024   MCV 91.8 08/26/2024   PLT 240 08/26/2024     Chemistry      Component Value Date/Time   NA 142 08/26/2024 0835   NA 140 12/30/2023 1028   NA 140 10/11/2017 1122   K 3.8 08/26/2024 0835   K 4.2 10/11/2017 1122   CL 107 08/26/2024 0835   CL 103 10/11/2017 1122   CO2 26 08/26/2024 0835   CO2 29 10/11/2017 1122   BUN 10 08/26/2024 0835   BUN 13 12/30/2023 1028   BUN 10 10/11/2017 1122   CREATININE 0.73 08/26/2024 0835   CREATININE 0.71 12/22/2020 0930      Component Value Date/Time   CALCIUM  8.9 08/26/2024 0835   CALCIUM  8.7 10/11/2017 1122   ALKPHOS 76 08/26/2024 0835   ALKPHOS 83  10/11/2017 1122   AST 21 08/26/2024 0835   ALT 17 08/26/2024 0835   ALT 36 10/11/2017 1122   BILITOT 0.3 08/26/2024 0835      Impression and Plan: Ms. Waddell is a 48 year-old white female.  She has  polycystic ovaries.  She had a pulmonary embolism in September 2018.  This was idiopathic.  We  have her on Eliquis .  She likes to be on low-dose Eliquis .  She does travel for work.  She just feels more comfortable.  Again, I do not see a problem with embolic disease.  She is on the low-dose Eliquis  and doing well with this.  We will still plan to get her back in 6 months.  Hopefully, should be able to lose a little bit more weight.    Maude JONELLE Crease, MD 11/19/20259:13 AM

## 2024-08-28 ENCOUNTER — Other Ambulatory Visit (HOSPITAL_COMMUNITY): Payer: Self-pay

## 2024-08-28 NOTE — Telephone Encounter (Signed)
 Attempted call to patient. Automated system states she is not accepting our call at the moment and no option given for voice mail. Could not leave a voicemail message.

## 2024-08-28 NOTE — Telephone Encounter (Signed)
 Please call pt nad ler her know we area appealing the decision on the Xifixan.

## 2024-09-01 ENCOUNTER — Encounter: Payer: Self-pay | Admitting: Family Medicine

## 2024-09-01 ENCOUNTER — Ambulatory Visit: Admitting: Family Medicine

## 2024-09-01 ENCOUNTER — Other Ambulatory Visit (HOSPITAL_COMMUNITY): Payer: Self-pay

## 2024-09-01 VITALS — BP 124/74 | HR 70 | Ht 64.0 in | Wt 234.0 lb

## 2024-09-01 DIAGNOSIS — T148XXA Other injury of unspecified body region, initial encounter: Secondary | ICD-10-CM

## 2024-09-01 NOTE — Patient Instructions (Signed)
 Please wash the area with soap and water. No alcohol or peroxide products.   Apply Aquaphor twice a day until healed.

## 2024-09-01 NOTE — Telephone Encounter (Signed)
 Pharmacy Patient Advocate Encounter  Received notification from [facility] that Prior Authorization appeal for Xifaxan  550mg  tabs has been APPROVED from 08/19/24 to 08/19/25   PA #/Case ID/Reference #: [id number]  Approval letter indexed to media tab

## 2024-09-01 NOTE — Progress Notes (Signed)
   Acute Office Visit  Patient ID: Barbara Mcmahon, female    DOB: Jul 06, 1976, 48 y.o.   MRN: [id number]  PCP: Alvan Dorothyann BIRCH, MD  Chief Complaint  Patient presents with   Foreign Body in Skin    Subjective:     HPI  Coming in today for a splinter on the left buttock area.  On Sunday approximately 2 days ago she was at a winery and sat down on a bench she tried to scoot over so somebody else could sit and suddenly felt a splinter go through her dress she had a very thin material on it is been painful and irritated since then and she would like help trying to get the splinter helped.  She has 2 areas that she can see mark on.  ROS     Objective:    BP 124/74   Pulse 70   Ht 5' 4 (1.626 m)   Wt 234 lb (106.1 kg)   SpO2 99%   BMI 40.17 kg/m    Physical Exam Vitals reviewed.  Constitutional:      Appearance: Normal appearance.  HENT:     Head: Normocephalic.  Pulmonary:     Effort: Pulmonary effort is normal.  Skin:    Comments: Splinter in left buttock.  Close to that area there is a scabbed area.  But I do not see an active splinter in the second affected area  Neurological:     Mental Status: She is alert and oriented to person, place, and time.  Psychiatric:        Mood and Affect: Mood normal.        Behavior: Behavior normal.       No results found for any visits on 09/01/24.     Assessment & Plan:   Problem List Items Addressed This Visit   None Visit Diagnoses       Splinter    -  Primary      We discussed removal of the splinter and patient agreed and gave verbal permission.  The area was cleansed with Hibiclens  about 1 cc of lidocaine  without epi was injected into the skin.  I was able to use sterile forceps to remove the splinter.  Upon close examination I did not see a splinter in the skin in the second more lateral location just some scabbing.  Follow-up wound care discussed call if any problems with healing no sign of active  infection currently.  Please wash the area with soap and water. No alcohol or peroxide products.   Apply Aquaphor twice a day until healed.    No orders of the defined types were placed in this encounter.   No follow-ups on file.  Dorothyann Alvan, MD [facility] Medicine at [facility]

## 2024-09-02 ENCOUNTER — Ambulatory Visit: Admitting: Family Medicine

## 2024-09-22 ENCOUNTER — Ambulatory Visit: Admitting: Family Medicine

## 2024-09-22 ENCOUNTER — Encounter: Payer: Self-pay | Admitting: Family Medicine

## 2024-09-22 VITALS — BP 114/76 | HR 104 | Ht 64.0 in | Wt 235.1 lb

## 2024-09-22 DIAGNOSIS — E88819 Insulin resistance, unspecified: Secondary | ICD-10-CM | POA: Diagnosis not present

## 2024-09-22 DIAGNOSIS — K580 Irritable bowel syndrome with diarrhea: Secondary | ICD-10-CM

## 2024-09-22 DIAGNOSIS — Z23 Encounter for immunization: Secondary | ICD-10-CM | POA: Diagnosis not present

## 2024-09-22 MED ORDER — ZEPBOUND 12.5 MG/0.5ML ~~LOC~~ SOAJ: 12.5000 mg | SUBCUTANEOUS | 2 refills | Status: DC

## 2024-09-22 NOTE — Assessment & Plan Note (Signed)
 Recent medication still in limbo, not approved by her insurance.

## 2024-09-22 NOTE — Assessment & Plan Note (Addendum)
°  Visit #: 10 Starting Weight: 285 lbs   Current weight:  235 lbs  Previous weight: 223 lbs  Change in weight form last OV: Up 12 lbs  Goal weight: She does not have a specific goal in mind I think originally she was thinking around 213. Dietary goals: Getting in 100 g protein daily, work on vegetables . Has cut out sweets.  Continue to work on adequate water intake. Exercise goals: Trying to get in short walks and doing some resistance training Medication: Continue Zepbound  12. 5 mg  Zofran  for the nausea prn  Follow-up and referrals: 12 weeks

## 2024-09-22 NOTE — Progress Notes (Signed)
 Established Patient Office Visit  Patient ID: Barbara Mcmahon, female    DOB: 1976/01/13  Age: 48 y.o. MRN: [id number] PCP: Barbara Barbara BIRCH, MD  Chief Complaint  Patient presents with   Weight Check    Subjective:     HPI  Discussed the use of AI scribe software for clinical note transcription with the patient, who gave verbal consent to proceed.  History of Present Illness Barbara Mcmahon is a 48 year old female who presents with weight gain and difficulty losing weight post-COVID infection.  Weight gain and difficulty losing weight - Gained approximately 15 pounds following COVID-19 infection - Unable to lose weight despite adherence to a healthy lifestyle - Consumes 80 to 100 grams of protein daily - Feels her body is 'stuck' at current weight - Desires to increase medication dosage to aid in weight loss  Dietary and lifestyle modifications - Consumes a high-protein breakfast to increase morning protein intake - Attempts to maintain hydration, but finds it challenging due to fatigue - Tries to maintain physical activity despite cold weather  Medication use and tolerability - Currently uses a medication delivery system via pens and prefers this method for its simplicity - Previously increased dose from 7.5 mg to 10 mg without significant nausea - Remains cautious about potential side effects with further dosage increases  Fatigue - Experiences fatigue, which impacts ability to stay hydrated     ROS    Objective:     BP 114/76   Pulse (!) 104   Ht 5' 4 (1.626 m)   Wt 235 lb 1.6 oz (106.6 kg)   SpO2 97%   BMI 40.35 kg/m    Physical Exam Vitals and nursing note reviewed.  Constitutional:      Appearance: Normal appearance.  HENT:     Head: Normocephalic and atraumatic.  Eyes:     Conjunctiva/sclera: Conjunctivae normal.  Cardiovascular:     Rate and Rhythm: Normal rate and regular rhythm.  Pulmonary:     Effort: Pulmonary effort is  normal.     Breath sounds: Normal breath sounds.  Skin:    General: Skin is warm and dry.  Neurological:     Mental Status: She is alert.  Psychiatric:        Mood and Affect: Mood normal.      No results found for any visits on 09/22/24.    The 10-year ASCVD risk score (Arnett DK, et al., 2019) is: 0.7%    Assessment & Plan:   Problem List Items Addressed This Visit       Digestive   Irritable bowel syndrome with diarrhea (colonoscopy 2007) (Chronic)   Recent medication still in limbo, not approved by her insurance.          Endocrine   Insulin  resistance - Primary     Visit #: 10 Starting Weight: 285 lbs   Current weight:  235 lbs  Previous weight: 223 lbs  Change in weight form last OV: Up 12 lbs  Goal weight: She does not have a specific goal in mind I think originally she was thinking around 213. Dietary goals: Getting in 100 g protein daily, work on vegetables . Has cut out sweets.  Continue to work on adequate water intake. Exercise goals: Trying to get in short walks and doing some resistance training Medication: Continue Zepbound  12. 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 Transitioning to [employer] for Zepbound  due to insurance issues. Considering dose increase due to weight loss plateau. Discussed potential nausea with dose increase and management with Zofran . Emphasized dietary changes focusing on protein intake and stable blood sugar levels. Prefers pen formulation for ease of use and safety. - Increased Zepbound  dose to 12.5 mg. - Continue dietary modifications  focusing on protein intake and stable blood sugar levels. - Monitor for nausea and manage with Zofran  if needed. - Continue using pen formulation for Zepbound .   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   General Health Maintenance Discussed flu vaccination timing and current flu trends.   Return in about 3 months (around 12/21/2024) for Wt mgt.    Barbara Byars, MD [facility] Primary Care & Sports Medicine at [facility]

## 2024-09-24 ENCOUNTER — Other Ambulatory Visit: Payer: Self-pay | Admitting: Family Medicine

## 2024-10-06 ENCOUNTER — Ambulatory Visit: Payer: Self-pay

## 2024-10-06 ENCOUNTER — Other Ambulatory Visit: Payer: Self-pay

## 2024-10-06 DIAGNOSIS — J101 Influenza due to other identified influenza virus with other respiratory manifestations: Secondary | ICD-10-CM

## 2024-10-06 MED ORDER — OSELTAMIVIR PHOSPHATE 75 MG PO CAPS: 75.0000 mg | ORAL_CAPSULE | Freq: Two times a day (BID) | ORAL | 0 refills | Status: DC

## 2024-10-06 MED ORDER — OSELTAMIVIR PHOSPHATE 75 MG PO CAPS: 75.0000 mg | ORAL_CAPSULE | Freq: Two times a day (BID) | ORAL | 0 refills | Status: AC

## 2024-10-06 NOTE — Telephone Encounter (Signed)
 Patient shows scheduled for 10/07/2024 with telehealth provider.

## 2024-10-06 NOTE — Telephone Encounter (Signed)
Meds ordered this encounter  Medications  . oseltamivir (TAMIFLU) 75 MG capsule    Sig: Take 1 capsule (75 mg total) by mouth 2 (two) times daily.    Dispense:  10 capsule    Refill:  0   

## 2024-10-06 NOTE — Telephone Encounter (Signed)
 Patient informed . Tamiflu  showing phone in  so prescription was resent via e- script .

## 2024-10-06 NOTE — Telephone Encounter (Signed)
 FYI Only or Action Required?: FYI only for provider: appointment scheduled on 10/07/2024.Virtual  Patient was last seen in primary care on 09/22/2024 by Alvan Dorothyann BIRCH, MD.  Called Nurse Triage reporting Influenza.  Symptoms began today.  Interventions attempted: Nothing.  Symptoms are: stable.  Triage Disposition: No disposition on file.  Patient/caregiver understands and will follow disposition?:   Copied from CRM [id number]. Topic: Clinical - Medical Advice >> Oct 06, 2024  3:01 PM Sophia H wrote: Reason for CRM: Patient states her husband tested positive for Flu A - she is having sore throat, bad headache & a feeling of needing to cough. Requesting to speak with nurse regarding symptoms. Answer Assessment - Initial Assessment Questions 1. SYMPTOMS: What is your main symptom or concern? (e.g., cough, fever, shortness of breath, muscle aches)     Sore throat, headache, nose is burning 2. ONSET: When did the symptoms start?      Today 3. COUGH: Do you have a cough? If Yes, ask: How bad is the cough?       Denies 4. FEVER: Do you have a fever? If Yes, ask: What is your temperature, how was it measured, and when did it start?     unsure 5. BREATHING DIFFICULTY: Are you having any difficulty breathing? (e.g., normal; shortness of breath, wheezing, unable to speak)      Denies 6. BETTER-SAME-WORSE: Are you getting better, staying the same or getting worse compared to yesterday?  If getting worse, ask, In what way?     Worsening 8. INFLUENZA EXPOSURE: Was there any known exposure to influenza (flu) before the symptoms began?      *No Answer* 9. INFLUENZA SUSPECTED: Why do you think you have influenza? (e.g., positive flu self-test at home, symptoms after exposure).     *No Answer* 10. INFLUENZA VACCINE: Have you had the flu vaccine? If Yes, ask: When did you last get it?       Yes 11. HIGH RISK FOR COMPLICATIONS: Do you have any chronic medical  problems? (e.g., asthma, heart or lung disease, obesity, weak immune system)  Protocols used: Influenza (Flu) Suspected-A-AH

## 2024-10-07 ENCOUNTER — Telehealth

## 2024-10-13 ENCOUNTER — Ambulatory Visit: Payer: Self-pay

## 2024-10-13 NOTE — Telephone Encounter (Signed)
 FYI Only or Action Required?: FYI only for provider: appointment scheduled on 10/14/24.  Patient was last seen in primary care on 09/22/2024 by Alvan Dorothyann BIRCH, MD.  Called Nurse Triage reporting Influenza.  Symptoms began a week ago.  Interventions attempted: Nothing.  Symptoms are: gradually worsening.  Triage Disposition: See Physician Within 24 Hours  Patient/caregiver understands and will follow disposition?: Yes    10 days ago onset of dry cough, headache, head/nasal congestion, sinus pressure and low grade fever. Highest was 101.1 F. No SOB. Constant central chest tightness, no pain. Reports every time she has gotten chest tightness that she had bronchitis.  Son was positive for the flu, had tamiflu  prescribed on Tuesday by PCP. Didn't take it as she tested herself on Tuesday and was positive. Retested herself later and was then positive. Wondering if she has sinus infection or bronchitis as her symptoms have been getting worse. Scheduled appt with different provider at home office tomorrow d/t no PCP availability within timeframe. Advised UC or ED for worsening symptoms.     Copied from CRM [id number]. Topic: Clinical - Red Word Triage >> Oct 13, 2024  4:20 PM Mercer PEDLAR wrote: Red Word that prompted transfer to Nurse Triage: fever, ear pressure, bad headaches, phlem, runny nose, chest tightness. Reason for Disposition  Fever present > 3 days (72 hours)  Answer Assessment - Initial Assessment Questions 1. SYMPTOMS: What is your main symptom or concern? (e.g., cough, fever, shortness of breath, muscle aches)     Dry cough, headache, head/nasal congestion, sinus pressure and low grade fever, chest tightness (chest tightness has always been the result of bronchitis for the pt)  2. ONSET: When did the symptoms start?      Saturday  3. COUGH: Do you have a cough? If Yes, ask: How bad is the cough?       Dry cough  4. FEVER: Do you have a fever? If Yes, ask: What  is your temperature, how was it measured, and when did it start?     101.1 F ear last night  5. BREATHING DIFFICULTY: Are you having any difficulty breathing? (e.g., normal; shortness of breath, wheezing, unable to speak)      Denies  6. BETTER-SAME-WORSE: Are you getting better, staying the same or getting worse compared to yesterday?  If getting worse, ask, In what way?     Worse  7. OTHER SYMPTOMS: Do you have any other symptoms?  (e.g., chills, fatigue, headache, loss of smell or taste, muscle pain, sore throat)     Denies  8. INFLUENZA EXPOSURE: Was there any known exposure to influenza (flu) before the symptoms began?      Son  9. INFLUENZA SUSPECTED: Why do you think you have influenza? (e.g., positive flu self-test at home, symptoms after exposure).     Positive home test, son has the flu  10. INFLUENZA VACCINE: Have you had the flu vaccine? If Yes, ask: When did you last get it?       Yes and the covid vaccine  11. HIGH RISK FOR COMPLICATIONS: Do you have any chronic medical problems? (e.g., asthma, heart or lung disease, obesity, weak immune system)       Denies  12. PREGNANCY: Is there any chance you are pregnant? When was your last menstrual period?       Denies  13. O2 SATURATION MONITOR:  Do you use an oxygen saturation monitor (pulse oximeter) at home? If Yes, ask What is your reading (oxygen  level) today? What is your usual oxygen saturation reading? (e.g., 95%)       Does not monitor  Protocols used: Influenza (Flu) Suspected-A-AH

## 2024-10-13 NOTE — Telephone Encounter (Signed)
 The patient has been scheduled on 10/14/24 at 150 pm with Dr. Alvia to address the following symptoms.

## 2024-10-14 ENCOUNTER — Encounter: Payer: Self-pay | Admitting: Family Medicine

## 2024-10-14 ENCOUNTER — Ambulatory Visit (INDEPENDENT_AMBULATORY_CARE_PROVIDER_SITE_OTHER): Payer: Self-pay | Admitting: Family Medicine

## 2024-10-14 VITALS — BP 108/74 | HR 90 | Temp 97.9°F | Ht 64.0 in | Wt 243.0 lb

## 2024-10-14 DIAGNOSIS — J329 Chronic sinusitis, unspecified: Secondary | ICD-10-CM

## 2024-10-14 DIAGNOSIS — Z8719 Personal history of other diseases of the digestive system: Secondary | ICD-10-CM | POA: Insufficient documentation

## 2024-10-14 DIAGNOSIS — J0100 Acute maxillary sinusitis, unspecified: Secondary | ICD-10-CM

## 2024-10-14 DIAGNOSIS — J40 Bronchitis, not specified as acute or chronic: Secondary | ICD-10-CM

## 2024-10-14 DIAGNOSIS — M1990 Unspecified osteoarthritis, unspecified site: Secondary | ICD-10-CM | POA: Insufficient documentation

## 2024-10-14 MED ORDER — METHYLPREDNISOLONE SODIUM SUCC 125 MG IJ SOLR: 125.0000 mg | Freq: Once | INTRAMUSCULAR | Status: DC

## 2024-10-14 MED ORDER — DOXYCYCLINE HYCLATE 100 MG PO TABS: 100.0000 mg | ORAL_TABLET | Freq: Two times a day (BID) | ORAL | 0 refills | Status: AC

## 2024-10-14 MED ORDER — METHYLPREDNISOLONE SODIUM SUCC 125 MG IJ SOLR
125.0000 mg | Freq: Once | INTRAMUSCULAR | Status: AC
  Administered 2024-10-14: 125 mg via INTRAMUSCULAR

## 2024-10-14 NOTE — Assessment & Plan Note (Signed)
 Recent influenza A with continued worsening of symptoms.  Treating with course of doxycycline  and prednisone .  Encouraged supportive care.  Contact clinic if not improving.

## 2024-10-14 NOTE — Patient Instructions (Signed)

## 2024-10-14 NOTE — Progress Notes (Signed)
 " Kortnee Bas - 49 y.o. female MRN [id number]  Date of birth: 02/14/1976  Subjective Chief Complaint  Patient presents with   Influenza    HPI Barbara Mcmahon is a 49 y.o. female here today with cough, congestion, fever, sinus pain and pressure.  Symptoms started about 2 weeks ago.  Her son had flu and she initially tested negative.  She had continued symptoms and tested again with positive test 3 days ago.  She has had significant sinus pressure and pain to the point that she is having trouble sleeping at night.  No fever since last night.  She is not taking anything for symptoms.  She is drinking a good amount of fluid.   ROS:  A comprehensive ROS was completed and negative except as noted per HPI  Allergies[1]  Past Medical History:  Diagnosis Date   ADHD (attention deficit hyperactivity disorder)    Anticoagulated    eliquis ---  managed by dr timmy   DDD (degenerative disc disease), lumbosacral    Family history of hypertrophic cardiomyopathy    last echo in epic 02-24-2021  ef 55-60%   GAD (generalized anxiety disorder)    Generalized anxiety disorder    GERD (gastroesophageal reflux disease)    History of abnormal cervical Pap smear    History of febrile seizure    per pt as infant resolved when toddler   History of irritable bowel syndrome    when younger in college   History of methicillin resistant staphylococcus aureus (MRSA) 2016   01/ 2016 abdominal wound;  02/ 2016  cellulitis/ abscess of leg   History of pulmonary embolism 07/04/2017   left lung (recent travel)  ;  takes eliquis ;   followed by dr timmy   Hx of adenomatous colonic polyps    Infertility, female    Menorrhagia    PCOS (polycystic ovarian syndrome)    PONV (postoperative nausea and vomiting)    Prediabetes    Wears contact lenses     Past Surgical History:  Procedure Laterality Date   BREAST BIOPSY Right 2019   benign   CHOLECYSTECTOMY, LAPAROSCOPIC  2003   COLONOSCOPY WITH PROPOFOL    11/30/2021   by  dr wilhelmenia   DIAGNOSTIC LAPAROSCOPY  2011   by dr mat;  for infertility   DILATION AND EVACUATION  03/12/2011   @[facility]   by dr mat;   missed ab   DILITATION & CURRETTAGE/HYSTROSCOPY WITH HYDROTHERMAL ABLATION N/A 06/18/2022   Procedure: DILATATION & CURETTAGE/HYSTEROSCOPY WITH HYDROTHERMAL ABLATION;  Surgeon: Mat Browning, MD;  Location: [facility];  Service: Gynecology;  Laterality: N/A;   IUD REMOVAL N/A 06/18/2022   Procedure: INTRAUTERINE DEVICE (IUD) REMOVAL;  Surgeon: Mat Browning, MD;  Location: [facility] [place];  Service: Gynecology;  Laterality: N/A;   KNEE ARTHROSCOPY Left    2000  and 07/ 2023   LAPAROSCOPIC GASTRIC SLEEVE RESECTION WITH HIATAL HERNIA REPAIR  05/22/2021   @[facility]   by dr forbes. tanda   LAPAROSCOPIC TUBAL LIGATION Bilateral 06/18/2022   Procedure: ATTEMPTED LAPAROSCOPY;  Surgeon: Mat Browning, MD;  Location: [facility] [place];  Service: Gynecology;  Laterality: Bilateral;   UPPER GI ENDOSCOPY N/A 05/22/2021   Procedure: UPPER GI ENDOSCOPY;  Surgeon: Tanda Locus, MD;  Location: [facility];  Service: General;  Laterality: N/A;    Social History   Socioeconomic History   Marital status: Married    Spouse name: Bernetta Sutley   Number of children: 2   Years of education: Not  on file   Highest education level: Bachelor's degree (e.g., BA, AB, BS)  Occupational History   Occupation: Publishing Copy  Tobacco Use   Smoking status: Never   Smokeless tobacco: Never  Vaping Use   Vaping status: Never Used  Substance and Sexual Activity   Alcohol use: Not Currently    Comment: very rare   Drug use: Never   Sexual activity: Not Currently    Birth control/protection: I.U.D.  Other Topics Concern   Not on file  Social History Narrative   Not on file   Social Drivers of Health   Tobacco Use: Low Risk (10/14/2024)   Patient History    Smoking Tobacco Use: Never    Smokeless Tobacco Use:  Never    Passive Exposure: Not on file  Financial Resource Strain: Low Risk (08/09/2024)   Overall Financial Resource Strain (CARDIA)    Difficulty of Paying Living Expenses: Not hard at all  Food Insecurity: No Food Insecurity (08/09/2024)   Epic    Worried About Programme Researcher, Broadcasting/film/video in the Last Year: Never true    Ran Out of Food in the Last Year: Never true  Transportation Needs: No Transportation Needs (08/09/2024)   Epic    Lack of Transportation (Medical): No    Lack of Transportation (Non-Medical): No  Physical Activity: Insufficiently Active (08/09/2024)   Exercise Vital Sign    Days of Exercise per Week: 3 days    Minutes of Exercise per Session: 20 min  Stress: No Stress Concern Present (08/09/2024)   [employer] of Occupational Health - Occupational Stress Questionnaire    Feeling of Stress: Only a little  Social Connections: Moderately Isolated (08/09/2024)   Social Connection and Isolation Panel    Frequency of Communication with Friends and Family: Never    Frequency of Social Gatherings with Friends and Family: Twice a week    Attends Religious Services: Never    Database Administrator or Organizations: Yes    Attends Engineer, Structural: More than 4 times per year    Marital Status: Married  Depression (PHQ2-9): Low Risk (09/22/2024)   Depression (PHQ2-9)    PHQ-2 Score: 0  Alcohol Screen: Low Risk (08/09/2024)   Alcohol Screen    Last Alcohol Screening Score (AUDIT): 2  Housing: Low Risk (08/09/2024)   Epic    Unable to Pay for Housing in the Last Year: No    Number of Times Moved in the Last Year: 0    Homeless in the Last Year: No  Utilities: Not on file  Health Literacy: Not on file    Family History  Problem Relation Age of Onset   Hypertension Mother    Depression Mother    Anxiety disorder Mother    Eating disorder Mother    Rheum arthritis Mother    Hypertension Father    Depression Father    Anxiety disorder Father    Sleep apnea  Father    Obesity Father    Rheum arthritis Father    Breast cancer Sister        Bilat   Colon cancer Paternal Aunt    Diabetes Paternal Grandmother    Hypertension Paternal Grandmother    Stroke Paternal Grandmother    Colon polyps Paternal Grandfather    Diabetes Paternal Grandfather    Hypertension Paternal Grandfather    Stroke Paternal Grandfather    Crohn's disease Cousin    Esophageal cancer Neg Hx    Stomach  cancer Neg Hx    Rectal cancer Neg Hx     Health Maintenance  Topic Date Due   Hepatitis B Vaccines 19-59 Average Risk (1 of 3 - 19+ 3-dose series) 09/01/2025 (Originally 01/29/1994)   Hepatitis C Screening  09/01/2025 (Originally 01/29/1993)   Mammogram  05/06/2026   Colonoscopy  11/30/2026   Cervical Cancer Screening (HPV/Pap Cotest)  04/27/2029   DTaP/Tdap/Td (3 - Td or Tdap) 02/01/2031   Influenza Vaccine  Completed   COVID-19 Vaccine  Completed   HIV Screening  Completed   Pneumococcal Vaccine  Aged Out   HPV VACCINES  Aged Out   Meningococcal B Vaccine  Aged Out     ----------------------------------------------------------------------------------------------------------------------------------------------------------------------------------------------------------------- Physical Exam BP 108/74 (BP Location: Left Wrist, Patient Position: Sitting, Cuff Size: Normal)   Pulse 90   Temp 97.9 F (36.6 C) (Temporal)   Ht 5' 4 (1.626 m)   Wt 243 lb (110.2 kg)   SpO2 100%   BMI 41.71 kg/m   Physical Exam Constitutional:      Appearance: Normal appearance.  HENT:     Head: Normocephalic and atraumatic.     Ears:     Comments: Serous fluid b/l.      Nose:     Comments: TTP along bilateral maxillary sinuses.  Eyes:     General: No scleral icterus. Cardiovascular:     Rate and Rhythm: Normal rate and regular rhythm.  Pulmonary:     Effort: Pulmonary effort is normal.     Breath sounds: Normal breath sounds.  Neurological:     Mental Status: She  is alert.  Psychiatric:        Mood and Affect: Mood normal.        Behavior: Behavior normal.     ------------------------------------------------------------------------------------------------------------------------------------------------------------------------------------------------------------------- Assessment and Plan  Acute maxillary sinusitis Recent influenza A with continued worsening of symptoms.  Treating with course of doxycycline  and prednisone .  Encouraged supportive care.  Contact clinic if not improving.     Meds ordered this encounter  Medications   methylPREDNISolone  sodium succinate (SOLU-MEDROL ) 125 mg/2 mL injection 125 mg   doxycycline  (VIBRA -TABS) 100 MG tablet    Sig: Take 1 tablet (100 mg total) by mouth 2 (two) times daily.    Dispense:  20 tablet    Refill:  0    No follow-ups on file.        [1]  Allergies Allergen Reactions   Cinnamon Shortness Of Breath and Rash    Wheezing    Morphine Hives, Itching and Rash    Other reaction(s): Other (See Comments) BEHAVIOR CHANGES Big mood changes irritable   Nutmeg Oil (Myristica Oil) Shortness Of Breath and Rash    wheezing    Shellfish Allergy Anaphylaxis, Hives and Swelling    Throat closes and wheezing;   per pt shrimp only   Shrimp Extract Shortness Of Breath    Other reaction(s): Wheezing   Clonazepam  Itching   Codeine Hives, Itching and Rash    Pt says she can take percocet   Hydrocodone  Hives   Menthol  Hives   Tramadol  Itching and Rash   Candida Albicans Other (See Comments)   Robitussin A-C [Guaifenesin -Codeine] Itching   Yeast (Saccharomyces Cerevisiae) Hives   "

## 2024-10-14 NOTE — Addendum Note (Signed)
 Addended by: Bernette Seeman Z on: 10/14/2024 02:16 PM   Modules accepted: Orders

## 2024-11-13 ENCOUNTER — Other Ambulatory Visit: Payer: Self-pay | Admitting: Family Medicine

## 2024-11-13 DIAGNOSIS — F5101 Primary insomnia: Secondary | ICD-10-CM

## 2024-12-21 ENCOUNTER — Ambulatory Visit: Admitting: Family Medicine

## 2025-02-23 ENCOUNTER — Inpatient Hospital Stay: Payer: Self-pay

## 2025-02-23 ENCOUNTER — Inpatient Hospital Stay: Admitting: Hematology & Oncology
